# Patient Record
Sex: Male | Born: 1965 | Race: White | Hispanic: No | Smoking: Former smoker
Health system: Southern US, Community
[De-identification: ages and names within clinical notes are randomized; demographics above are authoritative.]

## PROBLEM LIST (undated history)

## (undated) DIAGNOSIS — M5431 Sciatica, right side: Secondary | ICD-10-CM

## (undated) DIAGNOSIS — Z87820 Personal history of traumatic brain injury: Secondary | ICD-10-CM

## (undated) DIAGNOSIS — Z8719 Personal history of other diseases of the digestive system: Secondary | ICD-10-CM

## (undated) DIAGNOSIS — E785 Hyperlipidemia, unspecified: Secondary | ICD-10-CM

## (undated) DIAGNOSIS — F329 Major depressive disorder, single episode, unspecified: Secondary | ICD-10-CM

## (undated) DIAGNOSIS — M1990 Unspecified osteoarthritis, unspecified site: Secondary | ICD-10-CM

## (undated) DIAGNOSIS — R202 Paresthesia of skin: Secondary | ICD-10-CM

## (undated) DIAGNOSIS — F32A Depression, unspecified: Secondary | ICD-10-CM

## (undated) DIAGNOSIS — G43909 Migraine, unspecified, not intractable, without status migrainosus: Secondary | ICD-10-CM

## (undated) DIAGNOSIS — K449 Diaphragmatic hernia without obstruction or gangrene: Secondary | ICD-10-CM

## (undated) DIAGNOSIS — G4733 Obstructive sleep apnea (adult) (pediatric): Secondary | ICD-10-CM

## (undated) DIAGNOSIS — I10 Essential (primary) hypertension: Secondary | ICD-10-CM

## (undated) DIAGNOSIS — L439 Lichen planus, unspecified: Secondary | ICD-10-CM

## (undated) DIAGNOSIS — M109 Gout, unspecified: Secondary | ICD-10-CM

## (undated) DIAGNOSIS — Z973 Presence of spectacles and contact lenses: Secondary | ICD-10-CM

## (undated) DIAGNOSIS — Z8619 Personal history of other infectious and parasitic diseases: Secondary | ICD-10-CM

## (undated) DIAGNOSIS — F909 Attention-deficit hyperactivity disorder, unspecified type: Secondary | ICD-10-CM

## (undated) DIAGNOSIS — K5730 Diverticulosis of large intestine without perforation or abscess without bleeding: Secondary | ICD-10-CM

## (undated) DIAGNOSIS — T4145XA Adverse effect of unspecified anesthetic, initial encounter: Secondary | ICD-10-CM

## (undated) DIAGNOSIS — R200 Anesthesia of skin: Secondary | ICD-10-CM

## (undated) DIAGNOSIS — R413 Other amnesia: Secondary | ICD-10-CM

## (undated) DIAGNOSIS — Z972 Presence of dental prosthetic device (complete) (partial): Secondary | ICD-10-CM

## (undated) DIAGNOSIS — G8929 Other chronic pain: Secondary | ICD-10-CM

## (undated) DIAGNOSIS — K219 Gastro-esophageal reflux disease without esophagitis: Secondary | ICD-10-CM

## (undated) DIAGNOSIS — T8859XA Other complications of anesthesia, initial encounter: Secondary | ICD-10-CM

## (undated) DIAGNOSIS — M545 Low back pain, unspecified: Secondary | ICD-10-CM

## (undated) DIAGNOSIS — E039 Hypothyroidism, unspecified: Secondary | ICD-10-CM

## (undated) HISTORY — PX: KNEE ARTHROSCOPY: SHX127

## (undated) HISTORY — PX: SHOULDER ARTHROSCOPY: SHX128

## (undated) HISTORY — PX: ANKLE ARTHROSCOPY: SUR85

## (undated) HISTORY — PX: HAND SURGERY: SHX662

## (undated) HISTORY — PX: CARPAL TUNNEL RELEASE: SHX101

---
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
## 1990-12-02 DIAGNOSIS — Z87820 Personal history of traumatic brain injury: Secondary | ICD-10-CM

## 1990-12-02 HISTORY — DX: Personal history of traumatic brain injury: Z87.820

## 1998-12-15 ENCOUNTER — Ambulatory Visit (HOSPITAL_BASED_OUTPATIENT_CLINIC_OR_DEPARTMENT_OTHER): Admission: RE | Admit: 1998-12-15 | Discharge: 1998-12-15 | Payer: Self-pay | Admitting: Orthopedic Surgery

## 2000-11-26 ENCOUNTER — Encounter: Admission: RE | Admit: 2000-11-26 | Discharge: 2001-01-20 | Payer: Self-pay | Admitting: Family Medicine

## 2003-05-20 ENCOUNTER — Ambulatory Visit (HOSPITAL_BASED_OUTPATIENT_CLINIC_OR_DEPARTMENT_OTHER): Admission: RE | Admit: 2003-05-20 | Discharge: 2003-05-20 | Payer: Self-pay | Admitting: Orthopedic Surgery

## 2005-02-11 ENCOUNTER — Ambulatory Visit: Payer: Self-pay | Admitting: Family Medicine

## 2005-05-22 ENCOUNTER — Ambulatory Visit: Payer: Self-pay | Admitting: Family Medicine

## 2006-06-13 ENCOUNTER — Ambulatory Visit: Payer: Self-pay | Admitting: Internal Medicine

## 2006-07-22 ENCOUNTER — Encounter: Payer: Self-pay | Admitting: Internal Medicine

## 2006-07-22 ENCOUNTER — Ambulatory Visit: Payer: Self-pay | Admitting: Internal Medicine

## 2008-04-22 ENCOUNTER — Telehealth: Payer: Self-pay | Admitting: Internal Medicine

## 2008-05-30 ENCOUNTER — Ambulatory Visit (HOSPITAL_COMMUNITY): Admission: RE | Admit: 2008-05-30 | Discharge: 2008-05-30 | Payer: Self-pay | Admitting: Orthopedic Surgery

## 2008-10-06 ENCOUNTER — Ambulatory Visit (HOSPITAL_COMMUNITY): Admission: RE | Admit: 2008-10-06 | Discharge: 2008-10-07 | Payer: Self-pay | Admitting: Orthopedic Surgery

## 2009-06-15 ENCOUNTER — Encounter (INDEPENDENT_AMBULATORY_CARE_PROVIDER_SITE_OTHER): Payer: Self-pay | Admitting: *Deleted

## 2009-08-02 ENCOUNTER — Ambulatory Visit (HOSPITAL_COMMUNITY): Admission: EM | Admit: 2009-08-02 | Discharge: 2009-08-02 | Payer: Self-pay | Admitting: Emergency Medicine

## 2010-01-26 ENCOUNTER — Telehealth: Payer: Self-pay | Admitting: Internal Medicine

## 2010-02-20 ENCOUNTER — Emergency Department (HOSPITAL_COMMUNITY): Admission: EM | Admit: 2010-02-20 | Discharge: 2010-02-20 | Payer: Self-pay | Admitting: Emergency Medicine

## 2010-09-20 ENCOUNTER — Emergency Department (HOSPITAL_COMMUNITY)
Admission: EM | Admit: 2010-09-20 | Discharge: 2010-09-21 | Payer: Self-pay | Source: Home / Self Care | Admitting: Emergency Medicine

## 2010-09-21 ENCOUNTER — Inpatient Hospital Stay (HOSPITAL_COMMUNITY): Admission: AD | Admit: 2010-09-21 | Discharge: 2010-09-25 | Payer: Self-pay | Admitting: Psychiatry

## 2010-09-21 ENCOUNTER — Ambulatory Visit: Payer: Self-pay | Admitting: Psychiatry

## 2011-01-01 NOTE — Progress Notes (Signed)
Summary: Schedule Colonoscopy  Phone Note Outgoing Call Call back at [facility] Phone [phone]   Call placed by: Harlow Mares CMA Duncan Dull),  January 26, 2010 10:52 AM Call placed to: Patient Summary of Call: Left message on patients machine to call back. patient needs colonoscopy Initial call taken by: Harlow Mares CMA Duncan Dull),  January 26, 2010 10:52 AM  Follow-up for Phone Call        Left message on patients machine to call back.  Follow-up by: Harlow Mares CMA Duncan Dull),  February 08, 2010 11:32 AM

## 2011-02-13 LAB — CBC
MCH: 30.6 pg (ref 26.0–34.0)
MCV: 86.5 fL (ref 78.0–100.0)
Platelets: 189 10*3/uL (ref 150–400)
RDW: 12.7 % (ref 11.5–15.5)
WBC: 6.8 10*3/uL (ref 4.0–10.5)

## 2011-02-13 LAB — BASIC METABOLIC PANEL
BUN: 12 mg/dL (ref 6–23)
CO2: 30 mEq/L (ref 19–32)
Calcium: 9.5 mg/dL (ref 8.4–10.5)
Chloride: 104 mEq/L (ref 96–112)
Creatinine, Ser: 1.21 mg/dL (ref 0.4–1.5)

## 2011-02-13 LAB — DIFFERENTIAL
Basophils Absolute: 0 10*3/uL (ref 0.0–0.1)
Eosinophils Absolute: 0.1 10*3/uL (ref 0.0–0.7)
Eosinophils Relative: 2 % (ref 0–5)
Neutrophils Relative %: 67 % (ref 43–77)

## 2011-02-13 LAB — ETHANOL: Alcohol, Ethyl (B): <5 mg/dL (ref 0–10)

## 2011-02-13 LAB — RAPID URINE DRUG SCREEN, HOSP PERFORMED
Amphetamines: NOT DETECTED
Benzodiazepines: POSITIVE — AB
Cocaine: POSITIVE — AB

## 2011-02-13 LAB — POCT CARDIAC MARKERS: Troponin i, poc: <0.05 ng/mL (ref 0.00–0.09)

## 2011-02-25 LAB — DIFFERENTIAL
Eosinophils Relative: 1 % (ref 0–5)
Lymphocytes Relative: 16 % (ref 12–46)
Lymphs Abs: 1.4 10*3/uL (ref 0.7–4.0)
Monocytes Absolute: 0.7 10*3/uL (ref 0.1–1.0)

## 2011-02-25 LAB — BASIC METABOLIC PANEL
GFR calc Af Amer: >60 mL/min (ref >60)
GFR calc non Af Amer: >60 mL/min (ref >60)
Potassium: 3.7 mEq/L (ref 3.5–5.1)
Sodium: 137 mEq/L (ref 135–145)

## 2011-02-25 LAB — CBC
HCT: 45.3 % (ref 39.0–52.0)
Hemoglobin: 15.9 g/dL (ref 13.0–17.0)
Platelets: 180 10*3/uL (ref 150–400)
RBC: 5.29 MIL/uL (ref 4.22–5.81)
WBC: 8.4 10*3/uL (ref 4.0–10.5)

## 2011-02-25 LAB — ETHANOL: Alcohol, Ethyl (B): <5 mg/dL (ref 0–10)

## 2011-03-08 LAB — CBC
HCT: 44.6 % (ref 39.0–52.0)
Hemoglobin: 15.5 g/dL (ref 13.0–17.0)
MCHC: 34.8 g/dL (ref 30.0–36.0)
MCV: 89.4 fL (ref 78.0–100.0)
Platelets: 162 10*3/uL (ref 150–400)
RBC: 4.99 MIL/uL (ref 4.22–5.81)
RDW: 13.2 % (ref 11.5–15.5)
WBC: 6.8 10*3/uL (ref 4.0–10.5)

## 2011-03-08 LAB — BASIC METABOLIC PANEL
BUN: 15 mg/dL (ref 6–23)
CO2: 25 mEq/L (ref 19–32)
Calcium: 8.9 mg/dL (ref 8.4–10.5)
Chloride: 105 mEq/L (ref 96–112)
Creatinine, Ser: 1.01 mg/dL (ref 0.4–1.5)
GFR calc Af Amer: >60 mL/min (ref >60)
GFR calc non Af Amer: >60 mL/min (ref >60)
Glucose, Bld: 98 mg/dL (ref 70–99)
Potassium: 3.8 mEq/L (ref 3.5–5.1)
Sodium: 137 mEq/L (ref 135–145)

## 2011-03-08 LAB — DIFFERENTIAL
Basophils Absolute: 0 10*3/uL (ref 0.0–0.1)
Basophils Relative: 0 % (ref 0–1)
Eosinophils Absolute: 0.1 10*3/uL (ref 0.0–0.7)
Eosinophils Relative: 1 % (ref 0–5)
Lymphocytes Relative: 18 % (ref 12–46)
Lymphs Abs: 1.2 10*3/uL (ref 0.7–4.0)
Monocytes Absolute: 0.5 10*3/uL (ref 0.1–1.0)
Monocytes Relative: 8 % (ref 3–12)
Neutro Abs: 4.9 10*3/uL (ref 1.7–7.7)
Neutrophils Relative %: 73 % (ref 43–77)

## 2011-04-16 NOTE — Consult Note (Signed)
NAME:  QUANTAE, MARTEL NO.:  [id number]   MEDICAL RECORD NO.:  [id number]          PATIENT TYPE:  OBV   LOCATION:  2550                         FACILITY:  [facility]   PHYSICIAN:  Artist Pais. Weingold, M.D.DATE OF BIRTH:  01/17/1966   DATE OF CONSULTATION:  08/02/2009  DATE OF DISCHARGE:                                 CONSULTATION   PHYSICIAN REQUESTING CONSULTATION:  Dr. Clarene Duke   REASON FOR CONSULTATION:  Camp Gopal is a 45 year old left-hand  dominant male who was working on transmission and approximately had the  transmission fall onto his left hand and suffered a severe crush injury  to his left fifth digit with exposed tendon bone.  He is 45 years old,  he is now to sulfa drugs.  He is currently taking medicines for  hypercholesteremia, hypertension, and hypothyroidism.   PAST SURGICAL HISTORY:  He has had surgery in the past including  bilateral carpal tunnel syndrome surgeries, rotator cuff surgery.   FAMILY HISTORY:  Noncontributory.   SOCIAL HISTORY:  Noncontributory.   PHYSICAL EXAMINATION:  GENERAL:  Well-developed, well-nourished male,  pleasant, alert, and oriented x3.  EXTREMITIES:  Examination of his left hand, he has a severe crush  injury.  The volar aspect of his left fifth digit from the MCP flexion  crease, the tip is open, there is exposed flexor tendon, and gross  contamination as well as an obvious comminuted fracture of the distal  phalanx and probable involvement of the middle phalanx.  He can not flex  and extend.  Neurosensory exam is difficult.   X-rays revealed a comminuted fracture of the distal phalanx with minimal  remnant of bone seen in the x-ray as well as a fracture of the head of  the middle phalanx.   IMPRESSION:  A 45 year old left-hand dominant male with severe crush  injury to his left fifth digit.  I explained to both Mr. Jozwiak and his  family today.  I recommend that we take him to the operating room for an  I and  D and repair as necessary with possible amputation at the middle  phalangeal level.  He understands the risks and benefits.  We will take  him to the operating room as soon as possible for previously mentioned  procedures.      Artist Pais Mina Marble, M.D.  Electronically Signed     MAW/MEDQ  D:  08/02/2009  T:  08/03/2009  Job:  [id number]

## 2011-04-16 NOTE — Op Note (Signed)
NAME:  Douglas Wiggins, Douglas Wiggins NO.:  [id number]   MEDICAL RECORD NO.:  [id number]          PATIENT TYPE:  OIB   LOCATION:  5019                         FACILITY:  [facility]   PHYSICIAN:  Vania Rea. Supple, M.D.  DATE OF BIRTH:  [birth year]   DATE OF PROCEDURE:  10/06/2008  DATE OF DISCHARGE:                               OPERATIVE REPORT   PREOPERATIVE DIAGNOSES:  1. Chronic right shoulder impingement syndrome.  2. Right shoulder acromioclavicular joint arthrosis.  3. Right shoulder posterior inferior labral tear.   POSTOPERATIVE DIAGNOSES:  1. Chronic right shoulder impingement syndrome.  2. Right shoulder acromioclavicular joint arthrosis.  3. Right shoulder posterior inferior labral tear.  4. Partial articular tear of the subscapularis.   PROCEDURES:  1. Right shoulder examination under anesthesia.  2. Right shoulder diagnostic arthroscopy.  3. Debridement of posterior inferior labral tear.  4. Partial articular subscapularis tendon tear.  5. Arthroscopic subacromial depression and bursectomy.  6. Arthroscopic distal clavicle resection.   SURGEON:  Vania Rea. Supple, MD   ASSISTANT:  Lucita Lora. Shuford, PA-C   ANESTHESIA:  General endotracheal as well as a preoperative interscalene  block.   ESTIMATED BLOOD LOSS:  Minimal.   DRAINS:  None.   HISTORY:  Mr. Tilly is a 45 year old gentleman who has had chronic  right shoulder pain with weakness and restriction mobility.  His  symptoms that have been refractory to prolonged attempts at conservative  management.  His preoperative x-rays and MRI scan show evidence for  posterior inferior labral tear as well as associated paralabral cyst and  a type 2 acromial morphology.  Due to his ongoing pain and functional  limitations and failure to respond and prolonged attempts at  conservative management, he is brought to the operating room at this  time for a planned right shoulder arthroscopy as described below.   Preoperatively, I counseled Mr. Ingalsbe on treatment options as well as  risks versus benefits thereof.  Possible surgical complications  including infection, neurovascular injury, persistent pain, loss of  motion, anesthetic complication, and possible need for additional  surgery are reviewed.  He understands, accepts, and agrees with our  planned procedure.   PROCEDURE IN DETAIL:  After undergoing routine preop evaluation, the  patient received prophylactic antibiotics.  Then, an interscalene block  was established in holding area by the anesthesia department.  Placed  supine on the operative table and underwent smooth induction of a  general endotracheal anesthesia.  Turned to the left lateral decubitus  position on a beanbag and appropriately padded and protected.  The right  shoulder examination under anesthesia revealed full passive motion.  There were no obvious instability patterns.  Right arm was suspended at  70 degrees of abduction with 10 pounds of traction.  The right shoulder  girdle region was then sterilely prepped and draped in standard fashion.  Posterior portal was absent from the glenohumeral joint and anterior  portal was absent under direct visualization.  The glenohumeral  articular surfaces were in good condition.  The anterior labrum showed  some minimal degenerative fraying which was debrided as  did the superior  labrum consistent with a type 1 slap lesion.  There was a partial  articular tear of the subscapularis along its upper margin.  This was  debrided the shaver to a stable margin and the Arthrex wand was used to  obtain hemostasis.  Careful in evaluating the shoulder for instability  patterns, there was no evidence for pathologic laxity or instability.  There was, however, a posterior inferior labral tear which was debrided  with a shaver to stable margin.  The superior aspect of rotator cuff  including the distal supraspinatus insertion were carefully  inspected  and found to be in good condition.  Biceps tendon showed normal caliber  and no evidence for instability to proximally and distally.  At this  point, final hemostasis was then completed within the glenohumeral  joint.  Irrigation was completed.  Fluid and instruments were removed.  The arm was then dropped down to 30 degrees of abduction with the  arthroscope introduced in the subacromial space from posterior portal  and direct lateral portal established in the subacromial space.  This  was found to be abundant.  Very thick proliferative bursal tissue  throughout the subacromial/subdeltoid bursa.  This was excised with a  combination the shaver and with the Arthrex wand.  The wand was then  used to remove the periosteum from the undersurface of the anterior half  of the acromion, then subacromial decompression was then performed with  a bur creating a type of morphology.  Of note, the subacromial space was  very stenotic prior to decompression.  We then established a portal  directly anterior to the distal clavicle and the distal clavicle  resection was performed with a bur.  Care was taken to make sure the  entire circumference of the distal clavicle could be visualized to  ensure adequate removal of bone.  We then completed the  subacromial/subdeltoid bursectomy.  The bursal surface of rotator cuff  was found to be intact.  Final hemostasis was obtained.  Fluid and  instruments were removed.  The portals were closed with Monocryl and  Steri-Strips.  Bulky dry dressing was taped at the right shoulder.  The  right arm was placed in shoulder immobilizer.  The patient supine,  extubated, and taken to recovery room in stable condition.      Vania Rea. Supple, M.D.  Electronically Signed     KMS/MEDQ  D:  10/06/2008  T:  10/06/2008  Job:  [id number]

## 2011-04-16 NOTE — Op Note (Signed)
NAME:  [name], Douglas Wiggins NO.:  [id number]   MEDICAL RECORD NO.:  [id number]          PATIENT TYPE:  OBV   LOCATION:  2550                         FACILITY:  [facility]   PHYSICIAN:  Artist Pais. Weingold, M.D.DATE OF BIRTH:  01/31/1966   DATE OF PROCEDURE:  08/02/2009  DATE OF DISCHARGE:                               OPERATIVE REPORT   PREOPERATIVE DIAGNOSIS:  Severe crush injury, left fifth digit.   POSTOPERATIVE DIAGNOSIS:  Severe crush injury, left fifth digit.   PROCEDURES:  Incision and drainage of above with repair of flexor  digitorum profundus tendon, open treatment middle phalangeal fracture  and nail bed repair.   SURGEON:  Artist Pais. Mina Marble, MD   ASSISTANT:  None.   ANESTHESIA:  General.   TOURNIQUET TIME:  49 minutes.   COMPLICATIONS:  None.   DRAINS:  None.   OPERATIVE REPORT:  The patient was taken to the operating suite.  After  the induction of adequate general anesthesia, left upper extremity was  prepped and draped in usual sterile fashion.  An Esmarch was used to  exsanguinate the limb.  Tourniquet was then inflated to 250 mmHg.  At  this point in time, the volar aspect of the left fifth digit was  approached surgically.  There was a severely comminuted injury, which  had gross contamination and near obliteration of the distal phalanx, 2  or 3 very small remnant of bone were left.  The head of the middle  phalanx was comminuted and crushed as well.  The base of the middle  phalanx and proximal phalanx were intact.  The flexor sheath was  exposed.  The superficialis tendon was intact with a small tearing of  the radial most slip.  The profundus tendon was completely avulsed off  of the distal phalanx, and the A4 pulley was also partially damaged.  The wound was thoroughly irrigated, debrided of clot and nonviable  material.  The small bleeding fragments were excised leaving the distal  aspect of middle phalanx.  At this point in time, the  profundus tendon  was trimmed down to normal-looking tendon and sutured to the middle  phalanx periosteum in a tenodesis-type effect.  The A4 pulley was also  carefully also repaired.  Once this was completed, the skin was  approximated using 4-0 Vicryl Rapide suture to the level of the distal  phalanx.  The nail plate was carefully removed, and the nail bed was  repaired with 6-0 chromic.  There was a stellate comminuted complex  injury to the nail bed which was again repaired with 6-0 chromic.  After  this was done, the nail plate was placed back under the eponychial fold  and sewn in place using 4-0 Vicryl Rapide suture.  Intraoperative  fluoroscopy then revealed complete loss of the distal phalanx osseous  structures and an amputation of the middle phalanx distally just  proximal to the middle phalangeal head.  The patient's finger was  viable.  The tourniquet was released.  It was then dressed with  Xeroform, 4 x 4s, and an ulnar gutter splint.  The  patient tolerated the  procedures well, went to recovery room in stable fashion.      Artist Pais Mina Marble, M.D.  Electronically Signed     MAW/MEDQ  D:  08/02/2009  T:  08/03/2009  Job:  [id number]

## 2011-04-19 NOTE — Op Note (Signed)
NAME:  Douglas Wiggins, Douglas Wiggins NO.:  [id number]   MEDICAL RECORD NO.:  [id number]                   PATIENT TYPE:  AMB   LOCATION:  DSC                                  FACILITY:  [facility]   PHYSICIAN:  Harvie Junior, M.D.                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   POSTOPERATIVE DIAGNOSIS:  Degenerative joint disease ankle with lateral  pain.   PROCEDURE:  Left ankle arthroscopy with debridement of lateral gutter and  removal of medial loose body.   SURGEON:  Harvie Junior, M.D.   ASSISTANT:  Su Hilt, PA-C   ANESTHESIA:  General   BRIEF HISTORY:  He is a 45 year old male with a long history of having right  ankle and foot pain. He ultimately had been treated with injection therapy  which helped significantly followed by physical therapy and orthotics.  Following this, he continued to have ankle pain. It was improved  dramatically with intra-articular injection.  Because of his improvement  with intraarticular injection he will be taken to the operating room for  ankle arthroscopy after failure of other conservative care procedures.   DESCRIPTION OF PROCEDURE:  The patient taken to the operating room after  adequate anesthesia obtained with general anesthetic.  The patient was  placed on the operating room table where the right leg was prepped and  draped in the usual sterile fashion.  Following Esmarch bandage placed on  the extremity, blood pressure and tourniquet inflated to 350 mmHg.  Following this portals were made, anteromedial, anterolateral for  introduction of instruments and camera.  The ankle was identified and noted  to have some osteochondral defects on the lateral side as well as a fair  amount of synovitis. This was debrided and the lateral gutter was  cleaned.  Attention was then turned medially where there was noted to a medial loose  body inferior to the medial malleolus.  This was debrided and the medial  gutter was cleaned.  At this point the synovitis was removed anteriorly in  the knee.  The knee was then copiously irrigated and suctioned throughout.  The outside portals were closed with a bandage.  A sterile compression  dressing was applied and the patient was taken to recovery and was noted to  be in satisfactory condition.  Estimated blood loss for the procedure was  none.                                               Harvie Junior, M.D.    Ranae Plumber  D:  05/20/2003  T:  05/23/2003  Job:  [id number]   cc:   Dr. Montey Hora  [facility]

## 2011-04-19 NOTE — Assessment & Plan Note (Signed)
[facility]                           GASTROENTEROLOGY OFFICE NOTE   NAME:FALLINLevern, Pitter                         MRN:          [id number]  DATE:06/13/2006                            DOB:          [birth year]    REFERRING PHYSICIAN:  Dr. Samuel Jester.   REASON FOR CONSULTATION:  Multiple abdominal complaints.   HISTORY:  This is a 45 year old white male with a history of hypertension,  hypothyroidism, and unspecified arthritis who presents regarding multiple GI  complaints, precipitated by recent problems with rectal bleeding.  The  patient reports chronic problems with indigestion and heartburn for which he  takes omeprazole daily.  On medication no symptoms; off medication recurrent  symptoms.  He has been on the drug for approximately 5 years.  No dysphagia.  He also reports having had problems with abdominal cramping, urgency, and  loose stools as a child.  Currently with significant problems with bloating,  early satiety, and discomfort with bending.  He has gained 10 pounds over  the past 2 years.  Transient loose stools, though none recently.  Approximately a month ago saw significant amounts of red blood in the toilet  bowel.  There was no associated rectal or abdominal pain.  Subsequently last  week noticing more dark stools.  Hemoglobin after the rectal bleeding in Dr.  Silvana Newness office was normal on May 02, 2006, at 15.8.  He is accompanied by  his wife.   PAST MEDICAL HISTORY:  1.  Hypertension.  2.  Hypothyroidism.  3.  Unspecified arthritis.   PAST SURGICAL HISTORY:  1.  Hemorrhoidectomy.  2.  Right ankle surgery.  3.  Right knee surgery.  4.  Right carpal tunnel release.   ALLERGIES:  SULFA.   CURRENT MEDICATIONS:  1.  Requip 1 mg daily.  2.  Quinapril HCL 20 mg daily.  3.  Meloxicam 7.5 mg daily.  4.  Levoxyl 75 mcg daily.  5.  Hydroxyzine 10 mg p.r.n.  6.  Effexor XR 75 mg daily.  7.  Omeprazole 20 mg daily.   FAMILY  HISTORY:  No family history of gastrointestinal malignancy.   SOCIAL HISTORY:  The patient is married without children.  He is accompanied  by his wife.  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  He does not smoke and  occasionally uses alcohol.   REVIEW OF SYSTEMS:  Per diagnostic evaluation form.   PHYSICAL EXAMINATION:  GENERAL:  A slightly anxious but otherwise well-  appearing male in no acute distress.  VITAL SIGNS:  Blood pressure 154/94, heart rate is 64 and regular, weight  250.6 pounds.  He is 6 feet 4 inches in height.  HEENT:  Sclerae are anicteric.  Conjunctivae are pink.  Oral mucosa intact.  No adenopathy.  LUNGS:  Clear.  HEART:  Regular.  ABDOMEN:  Soft without tenderness, mass, or hernia.  Good bowel sounds  heard.  EXTREMITIES:  Without edema.   IMPRESSION:  1.  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  2.  More chronic complaints of bloating, gas, occasional urgency likely due      to irritable bowel syndrome.  3.  Recent problems with rectal bleeding and dark stools of uncertain      etiology.   RECOMMENDATIONS:  1.  Continue proton pump inhibitor.  2.  Schedule colonoscopy and upper endoscopy.  The nature of the procedures      as well as the risks, benefits, and alternatives have been reviewed.  He      understood and agreed to proceed.  3.  Ongoing general medical care with Dr. Charm Barges.                                   Wilhemina Bonito. Eda Keys., MD   JNP/MedQ  DD:  06/13/2006  DT:  06/13/2006  Job #:  [id number]   cc:   Samuel Jester

## 2011-09-02 LAB — CBC
MCHC: 34.2
Platelets: 261
RDW: 12.8

## 2011-09-02 LAB — COMPREHENSIVE METABOLIC PANEL
ALT: 40
AST: 36
Calcium: 9.8
GFR calc Af Amer: >60
Sodium: 137
Total Protein: 7

## 2011-09-02 LAB — APTT: aPTT: 33

## 2011-09-03 LAB — URINALYSIS, ROUTINE W REFLEX MICROSCOPIC
Glucose, UA: NEGATIVE
Nitrite: NEGATIVE
Protein, ur: NEGATIVE
pH: 6.5

## 2011-09-03 LAB — GLUCOSE, CAPILLARY: Glucose-Capillary: 94

## 2012-01-05 ENCOUNTER — Encounter (HOSPITAL_COMMUNITY): Payer: Self-pay

## 2012-01-05 ENCOUNTER — Emergency Department (HOSPITAL_COMMUNITY): Payer: BC Managed Care – PPO

## 2012-01-05 ENCOUNTER — Inpatient Hospital Stay (HOSPITAL_COMMUNITY)
Admission: EM | Admit: 2012-01-05 | Discharge: 2012-01-11 | DRG: 149 | Disposition: A | Discharge status: Home Health | Payer: BC Managed Care – PPO | Attending: General Surgery | Admitting: General Surgery

## 2012-01-05 ENCOUNTER — Other Ambulatory Visit: Payer: Self-pay

## 2012-01-05 DIAGNOSIS — E039 Hypothyroidism, unspecified: Secondary | ICD-10-CM | POA: Diagnosis present

## 2012-01-05 DIAGNOSIS — K5732 Diverticulitis of large intestine without perforation or abscess without bleeding: Principal | ICD-10-CM | POA: Diagnosis present

## 2012-01-05 DIAGNOSIS — R000 Tachycardia, unspecified: Secondary | ICD-10-CM | POA: Diagnosis present

## 2012-01-05 DIAGNOSIS — R110 Nausea: Secondary | ICD-10-CM

## 2012-01-05 DIAGNOSIS — K5720 Diverticulitis of large intestine with perforation and abscess without bleeding: Secondary | ICD-10-CM | POA: Diagnosis present

## 2012-01-05 DIAGNOSIS — E669 Obesity, unspecified: Secondary | ICD-10-CM | POA: Diagnosis present

## 2012-01-05 DIAGNOSIS — D72829 Elevated white blood cell count, unspecified: Secondary | ICD-10-CM | POA: Diagnosis present

## 2012-01-05 DIAGNOSIS — I10 Essential (primary) hypertension: Secondary | ICD-10-CM | POA: Diagnosis present

## 2012-01-05 DIAGNOSIS — R197 Diarrhea, unspecified: Secondary | ICD-10-CM

## 2012-01-05 DIAGNOSIS — Z6831 Body mass index (BMI) 31.0-31.9, adult: Secondary | ICD-10-CM

## 2012-01-05 DIAGNOSIS — R109 Unspecified abdominal pain: Secondary | ICD-10-CM

## 2012-01-05 DIAGNOSIS — R509 Fever, unspecified: Secondary | ICD-10-CM | POA: Diagnosis present

## 2012-01-05 DIAGNOSIS — E079 Disorder of thyroid, unspecified: Secondary | ICD-10-CM | POA: Diagnosis present

## 2012-01-05 HISTORY — DX: Essential (primary) hypertension: I10

## 2012-01-05 HISTORY — DX: Depression, unspecified: F32.A

## 2012-01-05 HISTORY — DX: Major depressive disorder, single episode, unspecified: F32.9

## 2012-01-05 LAB — URINALYSIS, ROUTINE W REFLEX MICROSCOPIC
Glucose, UA: NEGATIVE mg/dL
Ketones, ur: NEGATIVE mg/dL
Leukocytes, UA: NEGATIVE
Protein, ur: NEGATIVE mg/dL

## 2012-01-05 LAB — COMPREHENSIVE METABOLIC PANEL
AST: 20 U/L (ref 0–37)
BUN: 23 mg/dL (ref 6–23)
CO2: 24 mEq/L (ref 19–32)
Chloride: 98 mEq/L (ref 96–112)
Creatinine, Ser: 1.27 mg/dL (ref 0.50–1.35)
GFR calc non Af Amer: 68 mL/min — ABNORMAL LOW (ref >90)
Glucose, Bld: 109 mg/dL — ABNORMAL HIGH (ref 70–99)
Total Bilirubin: 0.6 mg/dL (ref 0.3–1.2)

## 2012-01-05 LAB — CBC
HCT: 46.7 % (ref 39.0–52.0)
Hemoglobin: 16.8 g/dL (ref 13.0–17.0)
WBC: 7.7 10*3/uL (ref 4.0–10.5)

## 2012-01-05 LAB — DIFFERENTIAL
Lymphocytes Relative: 30 % (ref 12–46)
Lymphs Abs: 2.3 10*3/uL (ref 0.7–4.0)
Monocytes Absolute: 0.8 10*3/uL (ref 0.1–1.0)
Monocytes Relative: 10 % (ref 3–12)
Neutro Abs: 4.4 10*3/uL (ref 1.7–7.7)

## 2012-01-05 LAB — LIPASE, BLOOD: Lipase: 34 U/L (ref 11–59)

## 2012-01-05 MED ORDER — ACETAMINOPHEN 325 MG PO TABS
650.0000 mg | ORAL_TABLET | Freq: Once | ORAL | Status: AC
  Administered 2012-01-05: 650 mg via ORAL
  Filled 2012-01-05: qty 2

## 2012-01-05 MED ORDER — HYDROMORPHONE HCL PF 1 MG/ML IJ SOLN
1.0000 mg | Freq: Once | INTRAMUSCULAR | Status: AC
  Administered 2012-01-05: 1 mg via INTRAVENOUS
  Filled 2012-01-05: qty 1

## 2012-01-05 MED ORDER — ONDANSETRON HCL 4 MG/2ML IJ SOLN: 4.0000 mg | Freq: Four times a day (QID) | INTRAMUSCULAR | Status: DC | PRN

## 2012-01-05 MED ORDER — PANTOPRAZOLE SODIUM 40 MG IV SOLR
40.0000 mg | Freq: Every day | INTRAVENOUS | Status: DC
  Administered 2012-01-06 – 2012-01-10 (×6): 40 mg via INTRAVENOUS
  Filled 2012-01-05 (×6): qty 40

## 2012-01-05 MED ORDER — SODIUM CHLORIDE 0.9 % IV SOLN
1.0000 g | INTRAVENOUS | Status: DC
  Filled 2012-01-05: qty 1

## 2012-01-05 MED ORDER — LEVOTHYROXINE SODIUM 75 MCG PO TABS
75.0000 ug | ORAL_TABLET | Freq: Every day | ORAL | Status: DC
  Administered 2012-01-07 – 2012-01-11 (×5): 75 ug via ORAL
  Filled 2012-01-05 (×7): qty 1

## 2012-01-05 MED ORDER — ONDANSETRON HCL 4 MG/2ML IJ SOLN
4.0000 mg | Freq: Once | INTRAMUSCULAR | Status: AC
  Administered 2012-01-05: 4 mg via INTRAVENOUS
  Filled 2012-01-05: qty 2

## 2012-01-05 MED ORDER — DEXTROSE IN LACTATED RINGERS 5 % IV SOLN
INTRAVENOUS | Status: DC
  Administered 2012-01-05 – 2012-01-06 (×2): via INTRAVENOUS

## 2012-01-05 MED ORDER — HYDROMORPHONE HCL PF 1 MG/ML IJ SOLN
0.5000 mg | INTRAMUSCULAR | Status: DC | PRN
  Administered 2012-01-05 – 2012-01-06 (×3): 1 mg via INTRAVENOUS
  Filled 2012-01-05 (×3): qty 1

## 2012-01-05 MED ORDER — SODIUM CHLORIDE 0.9 % IV BOLUS (SEPSIS)
1000.0000 mL | Freq: Once | INTRAVENOUS | Status: AC
  Administered 2012-01-05: 1000 mL via INTRAVENOUS

## 2012-01-05 MED ORDER — IOHEXOL 300 MG/ML  SOLN
100.0000 mL | Freq: Once | INTRAMUSCULAR | Status: AC | PRN
  Administered 2012-01-05: 100 mL via INTRAVENOUS

## 2012-01-05 MED ORDER — SODIUM CHLORIDE 0.9 % IV SOLN
1.0000 g | Freq: Once | INTRAVENOUS | Status: AC
  Administered 2012-01-05: 1 g via INTRAVENOUS
  Filled 2012-01-05 (×2): qty 1

## 2012-01-05 NOTE — ED Notes (Signed)
Suprapubic intermittent sharp pain x 3 days, states the pain started RLQ and moved to suprapubic area and is now radiating into the LLQ, no N/V/D, last BM today, has had blood in the stool x 3-4 months but stopped 3 days ago

## 2012-01-05 NOTE — ED Provider Notes (Addendum)
History     CSN: [id number]  Arrival date & time 01/05/12  1836   First MD Initiated Contact with Patient 01/05/12 1841      No chief complaint on file.   (Consider location/radiation/quality/duration/timing/severity/associated sxs/prior treatment) Patient is a 46 y.o. male presenting with abdominal pain.  Abdominal Pain The primary symptoms of the illness include abdominal pain. The primary symptoms of the illness do not include fever, vomiting, diarrhea or dysuria.  Symptoms associated with the illness do not include chills.   Patient states the pain actually began to 3 days ago, but became much worse this evening. He describes it as being diffuse in the center of the abdomen and in the bilateral lower quadrants. He denies vomiting or fever. He denies any hematuria or back pain. He denies any genital pain. He has had some blood in his stool, but attributes it more to hemorrhoids. The pain is currently 10 out of 10. He's had no chest pain. No shortness of breath. No hematemesis. He states he felt he may have had the pain. Several weeks ago, but did not see a doctor at that time. No past medical history on file.  No past surgical history on file.  No family history on file.  History  Substance Use Topics  . Smoking status: Not on file  . Smokeless tobacco: Not on file  . Alcohol Use: Not on file      Review of Systems  Constitutional: Negative for fever and chills.  Respiratory: Negative for chest tightness.   Gastrointestinal: Positive for abdominal pain. Negative for vomiting and diarrhea.  Genitourinary: Negative for dysuria and flank pain.  Neurological: Negative for seizures.  All other systems reviewed and are negative.    Allergies  Review of patient's allergies indicates not on file.  Home Medications  No current outpatient prescriptions on file.  BP 155/94  Pulse 79  Temp(Src) 98.1 F (36.7 C) (Oral)  Resp 16  SpO2 100%  Physical Exam  Nursing note  and vitals reviewed. Constitutional: He is oriented to person, place, and time. He appears well-developed and well-nourished.  HENT:  Head: Normocephalic and atraumatic.  Eyes: Conjunctivae and EOM are normal. Pupils are equal, round, and reactive to light.  Neck: Neck supple.  Cardiovascular: Normal rate and regular rhythm.  Exam reveals no gallop and no friction rub.   No murmur heard. Pulmonary/Chest: Breath sounds normal. He has no wheezes. He has no rales. He exhibits no tenderness.  Abdominal: Soft. Bowel sounds are normal. He exhibits no distension. There is tenderness. There is guarding. There is no rebound.       Diffuse midline lower abdominal tenderness, with some voluntary guarding. Bowel sounds are present, but minimal. There is no rebound tenderness.  Musculoskeletal: Normal range of motion.  Neurological: He is alert and oriented to person, place, and time. No cranial nerve deficit. Coordination normal.  Skin: Skin is warm and dry. No rash noted.  Psychiatric: He has a normal mood and affect.    ED Course  Procedures (including critical care time)  Labs Reviewed - No data to display No results found.   No diagnosis found.    MDM  Patient is seen and examined, initial history and physical is completed. Evaluation initiated  Stat labs, IV fluid hydration, IV pain meds. Stat CT with contrast and will follow closely.  Initial VSS.    Spoke with CT tech to expedite scan.    Will follow closely.    Results for  orders placed during the hospital encounter of 01/05/12  CBC      Component Value Range   WBC 7.7  4.0 - 10.5 (K/uL)   RBC 5.40  4.22 - 5.81 (MIL/uL)   Hemoglobin 16.8  13.0 - 17.0 (g/dL)   HCT 16.1  09.6 - 04.5 (%)   MCV 86.5  78.0 - 100.0 (fL)   MCH 31.1  26.0 - 34.0 (pg)   MCHC 36.0  30.0 - 36.0 (g/dL)   RDW 40.9  81.1 - 91.4 (%)   Platelets 157  150 - 400 (K/uL)  DIFFERENTIAL      Component Value Range   Neutrophils Relative 57  43 - 77 (%)    Neutro Abs 4.4  1.7 - 7.7 (K/uL)   Lymphocytes Relative 30  12 - 46 (%)   Lymphs Abs 2.3  0.7 - 4.0 (K/uL)   Monocytes Relative 10  3 - 12 (%)   Monocytes Absolute 0.8  0.1 - 1.0 (K/uL)   Eosinophils Relative 3  0 - 5 (%)   Eosinophils Absolute 0.2  0.0 - 0.7 (K/uL)   Basophils Relative 0  0 - 1 (%)   Basophils Absolute 0.0  0.0 - 0.1 (K/uL)  COMPREHENSIVE METABOLIC PANEL      Component Value Range   Sodium 135  135 - 145 (mEq/L)   Potassium 3.9  3.5 - 5.1 (mEq/L)   Chloride 98  96 - 112 (mEq/L)   CO2 24  19 - 32 (mEq/L)   Glucose, Bld 109 (*) 70 - 99 (mg/dL)   BUN 23  6 - 23 (mg/dL)   Creatinine, Ser 7.82  0.50 - 1.35 (mg/dL)   Calcium 95.6  8.4 - 10.5 (mg/dL)   Total Protein 7.8  6.0 - 8.3 (g/dL)   Albumin 3.9  3.5 - 5.2 (g/dL)   AST 20  0 - 37 (U/L)   ALT 26  0 - 53 (U/L)   Alkaline Phosphatase 72  39 - 117 (U/L)   Total Bilirubin 0.6  0.3 - 1.2 (mg/dL)   GFR calc non Af Amer 68 (*) >90 (mL/min)   GFR calc Af Amer 79 (*) >90 (mL/min)  LIPASE, BLOOD      Component Value Range   Lipase 34  11 - 59 (U/L)   Ct Abdomen Pelvis W Contrast  01/05/2012  *RADIOLOGY REPORT*  Clinical Data: Abdominal pain, guarding.  CT ABDOMEN AND PELVIS WITH CONTRAST  Technique:  Multidetector CT imaging of the abdomen and pelvis was performed following the standard protocol during bolus administration of intravenous contrast.  Contrast: OMNIPAQUE IOHEXOL 300 MG/ML IV SOLN  Comparison: None.  Findings: Minimal dependent atelectasis in the visualized lung bases.  Mild diffuse fatty infiltration of the liver without focal lesion.  Unremarkable gallbladder, spleen, adrenal glands, kidneys, pancreas, abdominal aorta stomach and small bowel decompressed. Normal appendix.  Scattered colonic diverticula.  There are inflammatory/edematous changes around the proximal sigmoid colon, a few tiny regional extraluminal gas bubbles and a small amount of free peritoneal gas.  There is a small amount of left lower  quadrant mesenteric fluid without peripheral enhancement or loculation to suggest abscess.   Urinary bladder incompletely distended.  Mild prostatic prominence with central calcification. No adenopathy.  IMPRESSION:  1.  Probable sigmoid diverticulitis with perforation and small amount of free peritoneal gas. Follow up recommended as perforated carcinoma can have a similar appearance.  2.  No evidence of abscess. I telephoned the critical test results to Dr.  Laura Radilla at the time of interpretation.  Original Report Authenticated By: Osa Craver, M.D.        8:23 PM  CT findings discussed with both the patient and with the general surgeon, Dr. Suzzanne Cloud, who started Invanz antibiotics as per Dr. Pollyann Kennedy. Bauer. Patient will likely need surgery. This evening. His vital signs are stable. Await formal consultation by the surgeon. We'll continue pain medications and n.p.o.   Orlinda Slomski A. Patrica Duel, MD 01/05/12 2023  Lorelle Gibbs. Patrica Duel, MD 01/05/12 2025

## 2012-01-05 NOTE — ED Notes (Signed)
MD at bedside. 

## 2012-01-05 NOTE — H&P (Signed)
Douglas Wiggins is an 46 y.o. male.   Chief Complaint:   Severe lower abdominal pain HPI:   He developed lower abdominal pain early Saturday morning. It was a little bit better this morning but then got acutely worse and was associated with some nausea and chills but no vomiting. He's had some diarrhea prior to this and states he has blood blood in his stool 7/10 times for a number of months even up to a year now. He had something like this a couple weeks ago but it was self-limited. He has not noted any change in this caliber of his stool. He has not had any unplanned weight loss. He was seen in emergency department and noted to have diverticulitis microperforation and I was asked to see him.  Past Medical History  Diagnosis Date  . Hypertension   . Thyroid disease     Past Surgical History  Procedure Date  . Knee surgery   . Shoulder surgery    Finger surgery-left hand  No family history on file. Social History:  reports that he has never smoked. He does not have any smokeless tobacco history on file. He reports that he does not drink alcohol or use illicit drugs. He is working. Allergies:  Allergies  Allergen Reactions  . Sulfa Antibiotics Hives and Rash    Medications Prior to Admission  Medication Dose Route Frequency Provider Last Rate Last Dose  . dextrose 5 % in lactated ringers infusion   Intravenous Continuous Adolph Pollack, MD      . ertapenem [facility]) 1 g in sodium chloride 0.9 % 50 mL IVPB  1 g Intravenous Once Peter A. Patrica Duel, MD   1 g at 01/05/12 2049  . ertapenem (INVANZ) 1 g in sodium chloride 0.9 % 50 mL IVPB  1 g Intravenous Q24H Adolph Pollack, MD      . HYDROmorphone (DILAUDID) injection 0.5-1 mg  0.5-1 mg Intravenous Q2H PRN Adolph Pollack, MD      . HYDROmorphone (DILAUDID) injection 1 mg  1 mg Intravenous Once Peter A. Patrica Duel, MD   1 mg at 01/05/12 1905  . HYDROmorphone (DILAUDID) injection 1 mg  1 mg Intravenous Once Peter A. Patrica Duel, MD   1 mg at  01/05/12 2050  . iohexol (OMNIPAQUE) 300 MG/ML solution 100 mL  100 mL Intravenous Once PRN Medication Radiologist, MD   100 mL at 01/05/12 2005  . levothyroxine (SYNTHROID, LEVOTHROID) tablet 75 mcg  75 mcg Oral Daily Adolph Pollack, MD      . ondansetron [facility]) injection 4 mg  4 mg Intravenous Once Peter A. Patrica Duel, MD   4 mg at 01/05/12 1905  . ondansetron (ZOFRAN) injection 4 mg  4 mg Intravenous Q6H PRN Adolph Pollack, MD      . pantoprazole (PROTONIX) injection 40 mg  40 mg Intravenous QHS Adolph Pollack, MD      . sodium chloride 0.9 % bolus 1,000 mL  1,000 mL Intravenous Once Peter A. Patrica Duel, MD   1,000 mL at 01/05/12 1909  . sodium chloride 0.9 % bolus 1,000 mL  1,000 mL Intravenous Once Peter A. Patrica Duel, MD   1,000 mL at 01/05/12 2045   No current outpatient prescriptions on file as of 01/05/2012.  Current facility-administered medications:dextrose 5 % in lactated ringers infusion, , Intravenous, Continuous, Adolph Pollack, MD;  ertapenem [facility]) 1 g in sodium chloride 0.9 % 50 mL IVPB, 1 g, Intravenous, Once, Peter A. Patrica Duel, MD, 1  g at 01/05/12 2049;  ertapenem [facility]) 1 g in sodium chloride 0.9 % 50 mL IVPB, 1 g, Intravenous, Q24H, Adolph Pollack, MD HYDROmorphone (DILAUDID) injection 0.5-1 mg, 0.5-1 mg, Intravenous, Q2H PRN, Adolph Pollack, MD;  HYDROmorphone (DILAUDID) injection 1 mg, 1 mg, Intravenous, Once, Peter A. Tucich, MD, 1 mg at 01/05/12 1905;  HYDROmorphone (DILAUDID) injection 1 mg, 1 mg, Intravenous, Once, Peter A. Tucich, MD, 1 mg at 01/05/12 2050;  iohexol (OMNIPAQUE) 300 MG/ML solution 100 mL, 100 mL, Intravenous, Once PRN, Medication Radiologist, MD, 100 mL at 01/05/12 2005 levothyroxine (SYNTHROID, LEVOTHROID) tablet 75 mcg, 75 mcg, Oral, Daily, Adolph Pollack, MD;  ondansetron [facility]) injection 4 mg, 4 mg, Intravenous, Once, Peter A. Patrica Duel, MD, 4 mg at 01/05/12 1905;  ondansetron [facility]) injection 4 mg, 4 mg, Intravenous, Q6H PRN, Adolph Pollack, MD;  pantoprazole (PROTONIX) injection 40 mg, 40 mg, Intravenous, QHS, Adolph Pollack, MD sodium chloride 0.9 % bolus 1,000 mL, 1,000 mL, Intravenous, Once, Peter A. Tucich, MD, 1,000 mL at 01/05/12 1909;  sodium chloride 0.9 % bolus 1,000 mL, 1,000 mL, Intravenous, Once, Peter A. Tucich, MD, 1,000 mL at 01/05/12 2045 Current outpatient prescriptions:levothyroxine (SYNTHROID, LEVOTHROID) 75 MCG tablet, Take 75 mcg by mouth daily., Disp: , Rfl: ;  lisinopril (PRINIVIL,ZESTRIL) 20 MG tablet, Take 20 mg by mouth daily., Disp: , Rfl: ;  meloxicam (MOBIC) 7.5 MG tablet, Take 7.5 mg by mouth daily., Disp: , Rfl:   Results for orders placed during the hospital encounter of 01/05/12 (from the past 48 hour(s))  CBC     Status: Normal   Collection Time   01/05/12  7:00 PM      Component Value Range Comment   WBC 7.7  4.0 - 10.5 (K/uL)    RBC 5.40  4.22 - 5.81 (MIL/uL)    Hemoglobin 16.8  13.0 - 17.0 (g/dL)    HCT 16.1  09.6 - 04.5 (%)    MCV 86.5  78.0 - 100.0 (fL)    MCH 31.1  26.0 - 34.0 (pg)    MCHC 36.0  30.0 - 36.0 (g/dL)    RDW 40.9  81.1 - 91.4 (%)    Platelets 157  150 - 400 (K/uL)   DIFFERENTIAL     Status: Normal   Collection Time   01/05/12  7:00 PM      Component Value Range Comment   Neutrophils Relative 57  43 - 77 (%)    Neutro Abs 4.4  1.7 - 7.7 (K/uL)    Lymphocytes Relative 30  12 - 46 (%)    Lymphs Abs 2.3  0.7 - 4.0 (K/uL)    Monocytes Relative 10  3 - 12 (%)    Monocytes Absolute 0.8  0.1 - 1.0 (K/uL)    Eosinophils Relative 3  0 - 5 (%)    Eosinophils Absolute 0.2  0.0 - 0.7 (K/uL)    Basophils Relative 0  0 - 1 (%)    Basophils Absolute 0.0  0.0 - 0.1 (K/uL)   COMPREHENSIVE METABOLIC PANEL     Status: Abnormal   Collection Time   01/05/12  7:00 PM      Component Value Range Comment   Sodium 135  135 - 145 (mEq/L)    Potassium 3.9  3.5 - 5.1 (mEq/L)    Chloride 98  96 - 112 (mEq/L)    CO2 24  19 - 32 (mEq/L)    Glucose, Bld 109 (*) 70 - 99 (  mg/dL)    BUN  23  6 - 23 (mg/dL)    Creatinine, Ser 1.61  0.50 - 1.35 (mg/dL)    Calcium 09.6  8.4 - 10.5 (mg/dL)    Total Protein 7.8  6.0 - 8.3 (g/dL)    Albumin 3.9  3.5 - 5.2 (g/dL)    AST 20  0 - 37 (U/L)    ALT 26  0 - 53 (U/L)    Alkaline Phosphatase 72  39 - 117 (U/L)    Total Bilirubin 0.6  0.3 - 1.2 (mg/dL)    GFR calc non Af Amer 68 (*) >90 (mL/min)    GFR calc Af Amer 79 (*) >90 (mL/min)   LIPASE, BLOOD     Status: Normal   Collection Time   01/05/12  7:00 PM      Component Value Range Comment   Lipase 34  11 - 59 (U/L)   OCCULT BLOOD, POC DEVICE     Status: Normal   Collection Time   01/05/12  9:02 PM      Component Value Range Comment   Fecal Occult Bld NEGATIVE      Ct Abdomen Pelvis W Contrast  01/05/2012  *RADIOLOGY REPORT*  Clinical Data: Abdominal pain, guarding.  CT ABDOMEN AND PELVIS WITH CONTRAST  Technique:  Multidetector CT imaging of the abdomen and pelvis was performed following the standard protocol during bolus administration of intravenous contrast.  Contrast: OMNIPAQUE IOHEXOL 300 MG/ML IV SOLN  Comparison: None.  Findings: Minimal dependent atelectasis in the visualized lung bases.  Mild diffuse fatty infiltration of the liver without focal lesion.  Unremarkable gallbladder, spleen, adrenal glands, kidneys, pancreas, abdominal aorta stomach and small bowel decompressed. Normal appendix.  Scattered colonic diverticula.  There are inflammatory/edematous changes around the proximal sigmoid colon, a few tiny regional extraluminal gas bubbles and a small amount of free peritoneal gas.  There is a small amount of left lower quadrant mesenteric fluid without peripheral enhancement or loculation to suggest abscess.   Urinary bladder incompletely distended.  Mild prostatic prominence with central calcification. No adenopathy.  IMPRESSION:  1.  Probable sigmoid diverticulitis with perforation and small amount of free peritoneal gas. Follow up recommended as perforated carcinoma can  have a similar appearance.  2.  No evidence of abscess. I telephoned the critical test results to Dr. Patrica Duel at the time of interpretation.  Original Report Authenticated By: Osa Craver, M.D.    Review of Systems  Constitutional: Positive for chills. Negative for fever and weight loss.  HENT: Negative for congestion.   Respiratory: Negative for cough and shortness of breath.   Cardiovascular: Negative for chest pain.  Gastrointestinal: Positive for nausea, abdominal pain and blood in stool. Negative for heartburn and vomiting.  Genitourinary: Negative for dysuria and hematuria.  Endo/Heme/Allergies: Does not bruise/bleed easily.    Blood pressure 155/94, pulse 79, temperature 98.1 F (36.7 C), temperature source Oral, resp. rate 16, SpO2 100.00%. Physical Exam  Constitutional:       Overweight male who is uncomfortable  HENT:  Head: Normocephalic and atraumatic.  Eyes: Conjunctivae and EOM are normal.  Neck: Neck supple.  Cardiovascular: Normal rate and regular rhythm.   No murmur heard. Respiratory: Effort normal and breath sounds normal.  GI: Soft. He exhibits no mass. There is tenderness (in lower abdomen). There is guarding.  Genitourinary: Prostate normal.       On digital rectal exam there is no gross blood and no masses are palpable  Musculoskeletal: Normal range of motion. He exhibits no edema.  Lymphadenopathy:    He has no cervical adenopathy.  Neurological: He is alert.  Skin: Skin is warm and dry. He is not diaphoretic.  Psychiatric: He has a normal mood and affect. His behavior is normal.     Assessment/Plan Acute sigmoid diverticulitis with fairly severe surrounding inflammatory response and some small locules of air around the area and maybe one or 2 anteriorly. Certainly there is no evidence of free perforation or abscess.  Plan: Admit to the hospital. Hydrate. Bowel rest. IV Invanz. If he responds to IV antibiotics and he will need a colonoscopy as  an outpatient to definitively rule out any neoplastic etiology. If he gets worse then he may need urgent sigmoid colectomy and colostomy. I have discussed this with him and his family and they seem to understand.  Ndia Sampath J 01/05/2012, 9:06 PM

## 2012-01-05 NOTE — ED Notes (Signed)
Douglas Wiggins [phone]

## 2012-01-06 ENCOUNTER — Encounter (HOSPITAL_COMMUNITY): Payer: Self-pay | Admitting: Anesthesiology

## 2012-01-06 ENCOUNTER — Other Ambulatory Visit (INDEPENDENT_AMBULATORY_CARE_PROVIDER_SITE_OTHER): Payer: Self-pay | Admitting: Surgery

## 2012-01-06 ENCOUNTER — Other Ambulatory Visit: Payer: Self-pay

## 2012-01-06 ENCOUNTER — Encounter (HOSPITAL_COMMUNITY): Admission: EM | Disposition: A | Discharge status: Home Health | Payer: Self-pay | Source: Home / Self Care

## 2012-01-06 ENCOUNTER — Inpatient Hospital Stay (HOSPITAL_COMMUNITY): Payer: BC Managed Care – PPO | Admitting: Anesthesiology

## 2012-01-06 DIAGNOSIS — K5720 Diverticulitis of large intestine with perforation and abscess without bleeding: Secondary | ICD-10-CM | POA: Diagnosis present

## 2012-01-06 DIAGNOSIS — E039 Hypothyroidism, unspecified: Secondary | ICD-10-CM | POA: Diagnosis present

## 2012-01-06 DIAGNOSIS — K5732 Diverticulitis of large intestine without perforation or abscess without bleeding: Principal | ICD-10-CM

## 2012-01-06 DIAGNOSIS — I10 Essential (primary) hypertension: Secondary | ICD-10-CM | POA: Diagnosis present

## 2012-01-06 DIAGNOSIS — E669 Obesity, unspecified: Secondary | ICD-10-CM | POA: Diagnosis present

## 2012-01-06 HISTORY — PX: COLOSTOMY REVISION: SHX5232

## 2012-01-06 LAB — BASIC METABOLIC PANEL
CO2: 27 mEq/L (ref 19–32)
GFR calc non Af Amer: 65 mL/min — ABNORMAL LOW (ref >90)
Glucose, Bld: 129 mg/dL — ABNORMAL HIGH (ref 70–99)
Potassium: 4.3 mEq/L (ref 3.5–5.1)
Sodium: 135 mEq/L (ref 135–145)

## 2012-01-06 LAB — CBC
Hemoglobin: 15.2 g/dL (ref 13.0–17.0)
MCH: 30.5 pg (ref 26.0–34.0)
RBC: 4.98 MIL/uL (ref 4.22–5.81)
WBC: 14.1 10*3/uL — ABNORMAL HIGH (ref 4.0–10.5)

## 2012-01-06 LAB — SURGICAL PCR SCREEN: MRSA, PCR: NEGATIVE

## 2012-01-06 SURGERY — COLECTOMY, SIGMOID, OPEN
Anesthesia: General | Site: Abdomen | Wound class: Dirty or Infected

## 2012-01-06 MED ORDER — KCL IN DEXTROSE-NACL 20-5-0.45 MEQ/L-%-% IV SOLN
INTRAVENOUS | Status: DC
  Administered 2012-01-06 – 2012-01-07 (×3): via INTRAVENOUS
  Administered 2012-01-07: 125 mL via INTRAVENOUS
  Administered 2012-01-08 – 2012-01-10 (×8): via INTRAVENOUS
  Administered 2012-01-11: 50 mL/h via INTRAVENOUS
  Filled 2012-01-06 (×16): qty 1000

## 2012-01-06 MED ORDER — SUCCINYLCHOLINE CHLORIDE 20 MG/ML IJ SOLN
INTRAMUSCULAR | Status: DC | PRN
  Administered 2012-01-06: 140 mg via INTRAVENOUS

## 2012-01-06 MED ORDER — NEOSTIGMINE METHYLSULFATE 1 MG/ML IJ SOLN
INTRAMUSCULAR | Status: DC | PRN
  Administered 2012-01-06: 4.5 mg via INTRAVENOUS

## 2012-01-06 MED ORDER — METOPROLOL TARTRATE 1 MG/ML IV SOLN
10.0000 mg | Freq: Four times a day (QID) | INTRAVENOUS | Status: DC
  Administered 2012-01-06 – 2012-01-11 (×17): 10 mg via INTRAVENOUS
  Filled 2012-01-06 (×23): qty 10

## 2012-01-06 MED ORDER — PROMETHAZINE HCL 25 MG/ML IJ SOLN: 6.2500 mg | INTRAMUSCULAR | Status: DC | PRN

## 2012-01-06 MED ORDER — ONDANSETRON HCL 4 MG PO TABS: 4.0000 mg | ORAL_TABLET | Freq: Four times a day (QID) | ORAL | Status: DC | PRN

## 2012-01-06 MED ORDER — SODIUM CHLORIDE 0.9 % IJ SOLN: 9.0000 mL | INTRAMUSCULAR | Status: DC | PRN

## 2012-01-06 MED ORDER — ONDANSETRON HCL 4 MG/2ML IJ SOLN: 4.0000 mg | Freq: Four times a day (QID) | INTRAMUSCULAR | Status: DC | PRN

## 2012-01-06 MED ORDER — ACETAMINOPHEN 10 MG/ML IV SOLN
INTRAVENOUS | Status: DC | PRN
  Administered 2012-01-06: 1000 mg via INTRAVENOUS

## 2012-01-06 MED ORDER — HYDROMORPHONE 0.3 MG/ML IV SOLN
INTRAVENOUS | Status: AC
  Administered 2012-01-07: 2.4 mg via INTRAVENOUS
  Filled 2012-01-06: qty 25

## 2012-01-06 MED ORDER — LIDOCAINE HCL (CARDIAC) 20 MG/ML IV SOLN
INTRAVENOUS | Status: DC | PRN
  Administered 2012-01-06: 100 mg via INTRAVENOUS

## 2012-01-06 MED ORDER — NALOXONE HCL 0.4 MG/ML IJ SOLN: 0.4000 mg | INTRAMUSCULAR | Status: DC | PRN

## 2012-01-06 MED ORDER — HYDROMORPHONE HCL PF 1 MG/ML IJ SOLN
INTRAMUSCULAR | Status: DC | PRN
  Administered 2012-01-06: 2 mg via INTRAVENOUS
  Administered 2012-01-06 (×2): 1 mg via INTRAVENOUS

## 2012-01-06 MED ORDER — SODIUM CHLORIDE 0.9 % IV SOLN
1.0000 g | INTRAVENOUS | Status: DC
  Administered 2012-01-06 – 2012-01-10 (×5): 1 g via INTRAVENOUS
  Filled 2012-01-06 (×7): qty 1

## 2012-01-06 MED ORDER — HYDROMORPHONE HCL PF 1 MG/ML IJ SOLN
0.2500 mg | INTRAMUSCULAR | Status: DC | PRN
  Administered 2012-01-06 (×4): 0.5 mg via INTRAVENOUS

## 2012-01-06 MED ORDER — MEPERIDINE HCL 50 MG/ML IJ SOLN: 6.2500 mg | INTRAMUSCULAR | Status: DC | PRN

## 2012-01-06 MED ORDER — GLYCOPYRROLATE 0.2 MG/ML IJ SOLN
INTRAMUSCULAR | Status: DC | PRN
  Administered 2012-01-06: .6 mg via INTRAVENOUS

## 2012-01-06 MED ORDER — MIDAZOLAM HCL 5 MG/5ML IJ SOLN
INTRAMUSCULAR | Status: DC | PRN
  Administered 2012-01-06: 2 mg via INTRAVENOUS

## 2012-01-06 MED ORDER — DIPHENHYDRAMINE HCL 50 MG/ML IJ SOLN
12.5000 mg | Freq: Four times a day (QID) | INTRAMUSCULAR | Status: DC | PRN
  Administered 2012-01-06 – 2012-01-09 (×3): 12.5 mg via INTRAVENOUS
  Filled 2012-01-06 (×3): qty 1

## 2012-01-06 MED ORDER — ONDANSETRON HCL 4 MG/2ML IJ SOLN
INTRAMUSCULAR | Status: DC | PRN
  Administered 2012-01-06: 4 mg via INTRAVENOUS

## 2012-01-06 MED ORDER — FENTANYL CITRATE 0.05 MG/ML IJ SOLN
INTRAMUSCULAR | Status: DC | PRN
  Administered 2012-01-06 (×2): 100 ug via INTRAVENOUS
  Administered 2012-01-06: 150 ug via INTRAVENOUS

## 2012-01-06 MED ORDER — ACETAMINOPHEN 325 MG PO TABS
650.0000 mg | ORAL_TABLET | ORAL | Status: DC | PRN
  Administered 2012-01-06 – 2012-01-08 (×3): 650 mg via ORAL
  Filled 2012-01-06 (×3): qty 2

## 2012-01-06 MED ORDER — LACTATED RINGERS IV SOLN
INTRAVENOUS | Status: DC | PRN
  Administered 2012-01-06 (×4): via INTRAVENOUS

## 2012-01-06 MED ORDER — ENOXAPARIN SODIUM 40 MG/0.4ML ~~LOC~~ SOLN
40.0000 mg | SUBCUTANEOUS | Status: DC
  Administered 2012-01-07 – 2012-01-11 (×5): 40 mg via SUBCUTANEOUS
  Filled 2012-01-06 (×5): qty 0.4

## 2012-01-06 MED ORDER — 0.9 % SODIUM CHLORIDE (POUR BTL) OPTIME
TOPICAL | Status: DC | PRN
  Administered 2012-01-06: 2000 mL

## 2012-01-06 MED ORDER — HYDROMORPHONE 0.3 MG/ML IV SOLN
INTRAVENOUS | Status: DC
  Administered 2012-01-06: 15:00:00 via INTRAVENOUS
  Administered 2012-01-06: 5.4 mg via INTRAVENOUS
  Administered 2012-01-06: 23:00:00 via INTRAVENOUS
  Administered 2012-01-06: 0.3 mg via INTRAVENOUS
  Administered 2012-01-07: 2.33 mg via INTRAVENOUS
  Administered 2012-01-07: 3 mg via INTRAVENOUS
  Administered 2012-01-07: 1.5 mg via INTRAVENOUS
  Administered 2012-01-07: 1.18 mg via INTRAVENOUS
  Administered 2012-01-08: 0.9 mg via INTRAVENOUS
  Administered 2012-01-08: 0.6 mg via INTRAVENOUS
  Administered 2012-01-08: 0.3 mg via INTRAVENOUS
  Administered 2012-01-08: 19:00:00 via INTRAVENOUS
  Administered 2012-01-08 (×2): 0.3 mg via INTRAVENOUS
  Administered 2012-01-09: 0.6 mg via INTRAVENOUS
  Administered 2012-01-09: 2.4 mg via INTRAVENOUS
  Administered 2012-01-09: 0.6 mg via INTRAVENOUS
  Administered 2012-01-09: 1.2 mg via INTRAVENOUS
  Administered 2012-01-09: 1.8 mg via INTRAVENOUS
  Administered 2012-01-10: 0.3 mg via INTRAVENOUS

## 2012-01-06 MED ORDER — DIPHENHYDRAMINE HCL 50 MG/ML IJ SOLN
12.5000 mg | INTRAMUSCULAR | Status: AC
  Administered 2012-01-06: 12.5 mg via INTRAVENOUS

## 2012-01-06 MED ORDER — FENTANYL CITRATE 0.05 MG/ML IJ SOLN
50.0000 ug | INTRAMUSCULAR | Status: DC | PRN
  Administered 2012-01-06: 100 ug via INTRAVENOUS

## 2012-01-06 MED ORDER — ROCURONIUM BROMIDE 100 MG/10ML IV SOLN
INTRAVENOUS | Status: DC | PRN
  Administered 2012-01-06: 40 mg via INTRAVENOUS
  Administered 2012-01-06 (×2): 20 mg via INTRAVENOUS

## 2012-01-06 MED ORDER — DIPHENHYDRAMINE HCL 12.5 MG/5ML PO ELIX
12.5000 mg | ORAL_SOLUTION | Freq: Four times a day (QID) | ORAL | Status: DC | PRN
  Filled 2012-01-06 (×2): qty 5

## 2012-01-06 MED ORDER — LACTATED RINGERS IV SOLN
INTRAVENOUS | Status: DC
  Administered 2012-01-06: 1000 mL via INTRAVENOUS

## 2012-01-06 MED ORDER — PROPOFOL 10 MG/ML IV EMUL
INTRAVENOUS | Status: DC | PRN
  Administered 2012-01-06: 200 mg via INTRAVENOUS

## 2012-01-06 SURGICAL SUPPLY — 41 items
APPLICATOR COTTON TIP 6IN STRL (MISCELLANEOUS) IMPLANT
BARRIER SKIN 2 3/4 (OSTOMY) ×3 IMPLANT
BLADE EXTENDED COATED 6.5IN (ELECTRODE) ×3 IMPLANT
BLADE HEX COATED 2.75 (ELECTRODE) ×3 IMPLANT
CANISTER SUCTION 2500CC (MISCELLANEOUS) ×3 IMPLANT
CLAMP POUCH DRAINAGE QUIET (OSTOMY) ×3 IMPLANT
CLOTH BEACON ORANGE TIMEOUT ST (SAFETY) ×3 IMPLANT
COVER MAYO STAND STRL (DRAPES) ×3 IMPLANT
COVER SURGICAL LIGHT HANDLE (MISCELLANEOUS) ×3 IMPLANT
DRAPE LAPAROSCOPIC ABDOMINAL (DRAPES) ×3 IMPLANT
DRAPE UTILITY XL STRL (DRAPES) ×3 IMPLANT
DRAPE WARM FLUID 44X44 (DRAPE) ×3 IMPLANT
DRSG VAC ATS LRG SENSATRAC (GAUZE/BANDAGES/DRESSINGS) ×3 IMPLANT
ELECT REM PT RETURN 9FT ADLT (ELECTROSURGICAL) ×3
ELECTRODE REM PT RTRN 9FT ADLT (ELECTROSURGICAL) ×2 IMPLANT
GLOVE BIO SURGEON STRL SZ7 (GLOVE) ×3 IMPLANT
GLOVE BIOGEL PI IND STRL 7.0 (GLOVE) ×2 IMPLANT
GLOVE BIOGEL PI IND STRL 7.5 (GLOVE) ×2 IMPLANT
GLOVE BIOGEL PI INDICATOR 7.0 (GLOVE) ×1
GLOVE BIOGEL PI INDICATOR 7.5 (GLOVE) ×1
GOWN STRL NON-REIN LRG LVL3 (GOWN DISPOSABLE) ×6 IMPLANT
GOWN STRL REIN XL XLG (GOWN DISPOSABLE) ×3 IMPLANT
KIT BASIN OR (CUSTOM PROCEDURE TRAY) ×3 IMPLANT
LIGASURE IMPACT 36 18CM CVD LR (INSTRUMENTS) ×3 IMPLANT
NS IRRIG 1000ML POUR BTL (IV SOLUTION) ×6 IMPLANT
PACK GENERAL/GYN (CUSTOM PROCEDURE TRAY) ×3 IMPLANT
RELOAD PROXIMATE 75MM BLUE (ENDOMECHANICALS) ×3 IMPLANT
SPONGE GAUZE 4X4 12PLY (GAUZE/BANDAGES/DRESSINGS) ×3 IMPLANT
SPONGE LAP 18X18 X RAY DECT (DISPOSABLE) IMPLANT
STAPLER PROXIMATE 75MM BLUE (STAPLE) ×3 IMPLANT
STAPLER VISISTAT 35W (STAPLE) IMPLANT
SUCTION POOLE TIP (SUCTIONS) ×3 IMPLANT
SUT PDS AB 1 CTX 36 (SUTURE) IMPLANT
SUT SILK 2 0 SH CR/8 (SUTURE) ×3 IMPLANT
SUT SILK 3 0 (SUTURE)
SUT SILK 3 0 SH CR/8 (SUTURE) ×3 IMPLANT
SUT SILK 3-0 18XBRD TIE 12 (SUTURE) IMPLANT
TOWEL OR 17X26 10 PK STRL BLUE (TOWEL DISPOSABLE) ×6 IMPLANT
TRAY FOLEY CATH 14FRSI W/METER (CATHETERS) ×3 IMPLANT
WATER STERILE IRR 1500ML POUR (IV SOLUTION) IMPLANT
YANKAUER SUCT BULB TIP NO VENT (SUCTIONS) ×3 IMPLANT

## 2012-01-06 NOTE — Op Note (Signed)
Preop diagnosis: Perforated sigmoid diverticulitis Postop diagnosis: Same Procedure performed: Exploratory laparotomy with sigmoid colectomy and descending colostomy (Hartman's procedure) Surgeon:Ammar Moffatt K. Assistant: Dr. Emelia Loron Anesthesia: Gen. endotracheal Indications: This is a 46 year old male who presents with a two-week history of low abdominal pain. Yesterday this became worse and he can't emerged department for evaluation. He was noted to have perforated diverticulitis but he appeared stable and was afebrile. He was admitted overnight to the hospital and was started on intravenous antibiotics. Through the night his symptoms of worsening is now become febrile. The decision has been made to proceed with surgery. He understands that he will end up with a temporary colostomy.  Description of procedure: Patient brought to the operating room placed in supine position on the operating room table. After an adequate level of general anesthesia was obtained, a Foley catheter was placed in sterile technique. A timeout was taken to ensure the proper patient proper procedure. We shaved and prepped his abdomen with chlor prep and draped sterile fashion. We made a vertical midline incision. Dissection was carried down to the fascia. The fascia was divided vertically. We entered the peritoneal cavity bluntly. We opened the incision widely. A large amount of purulent fluid was noted in the lower abdomen. We placed a Bookwalter retractor. The patient has a large amount of fairly redundant sigmoid colon. In the proximal sigmoid colon in the left lower quadrant there is an area with a perforation and obvious inflammation and infection. The colon above and below this area appears relatively normal. The surrounding small bowel shows some reactive inflammatory changes. We mobilized the descending colon by dividing the white line of Toldt with cautery. We mobilized the descending colon medially. We  mobilized the sigmoid colon up out of the pelvis. There seemed to be a fairly short segment of colon that seems to be involved. We divided the proximal sigmoid colon just proximal to the area of inflammation with a GIA-75 stapler. We divided the miso: With the LigaSure device. When we were distal to the area of inflammation we divided the mid sigmoid colon with a another GIA-75 stapler. The specimen was oriented with a suture proximal. This was sent for pathologic examination. We then thoroughly irrigated the abdomen. The rectal stump was tagged with a 2-0 Prolene suture. The remaining small bowel shows some secondary inflammation but no sign of obstruction. We had previously had the patient's abdomen marked for colostomy. We removed a circle of skin and subcutaneous fat. The fascia was divided vertically. The inferior epigastric vessel was bleeding so we ligated it. We then mobilized the descending colon and brought out through the ostomy site after dilating this up to 3 fingers. We again irrigated thoroughly. The fascia was reapproximated with double-stranded #1 PDS suture. The subcutaneous tissues were irrigated. We stapled the skin around the umbilicus and then placed a VAC sponge in the proximal and distal portions of the wound. These sponges were placed to 125 mm Hg of suction.  The colostomy was then matured with a 3-0 Vicryl interrupted suture. An ostomy appliance was cut to fit. The patient was then extubated and brought to the recovery room in stable condition. All sponge, initially, and needle counts are correct.  [name]. [name], MD, [facility]  01/06/2012 1:49 PM

## 2012-01-06 NOTE — Transfer of Care (Signed)
Immediate Anesthesia Transfer of Care Note  Patient: Douglas Wiggins  Procedure(s) Performed:  COLON RESECTION SIGMOID - with hartmans procedure  Patient Location: PACU  Anesthesia Type: General  Level of Consciousness: awake, alert , oriented, patient cooperative and responds to stimulation  Airway & Oxygen Therapy: Patient Spontanous Breathing and Patient connected to face mask oxygen  Post-op Assessment: Report given to PACU RN, Post -op Vital signs reviewed and stable and Patient moving all extremities  Post vital signs: Reviewed and stable  Complications: No apparent anesthesia complications

## 2012-01-06 NOTE — Progress Notes (Signed)
Dr Abbey Chatters notified of pt temp 101.7 and pain level.  Orders received at this time

## 2012-01-06 NOTE — Progress Notes (Signed)
Subjective: Very tender and painful abdomen.  WBC up, Temp up to 102.4 last PM  Objective: Vital signs in last 24 hours: Temp:  [98.1 F (36.7 C)-102.4 F (39.1 C)] 99.7 F (37.6 C) (02/04 0448) Pulse Rate:  [79-124] 102  (02/04 0448) Resp:  [16-21] 20  (02/04 0448) BP: (107-158)/(63-94) 115/71 mmHg (02/04 0448) SpO2:  [91 %-100 %] 93 % (02/04 0448) Weight:  [115.667 kg (255 lb)] 115.667 kg (255 lb) (02/03 2231) Last BM Date: 01/04/12  Intake/Output from previous day: 02/03 0701 - 02/04 0700 In: 1385 [I.V.:1385] Out: 400 [Urine:400] Intake/Output this shift:    PE: Alert, having allot of pain.  Chest: Clear  Abd:  Diffusely   Tender, mild distension, +umbilical hernia, it's also tender.  Lab Results:   Basename 01/06/12 0330 01/05/12 1900  WBC 14.1* 7.7  HGB 15.2 16.8  HCT 43.5 46.7  PLT 155 157    Lab 01/05/12 1900  AST 20  ALT 26  ALKPHOS 72  BILITOT 0.6  PROT 7.8  ALBUMIN 3.9    BMET  Basename 01/06/12 0330 01/05/12 1900  NA 135 135  K 4.3 3.9  CL 98 98  CO2 27 24  GLUCOSE 129* 109*  BUN 20 23  CREATININE 1.31 1.27  CALCIUM 9.3 10.2   PT/INR No results found for this basename: LABPROT:2,INR:2 in the last 72 hours   Studies/Results: Ct Abdomen Pelvis W Contrast  01/05/2012  *RADIOLOGY REPORT*  Clinical Data: Abdominal pain, guarding.  CT ABDOMEN AND PELVIS WITH CONTRAST  Technique:  Multidetector CT imaging of the abdomen and pelvis was performed following the standard protocol during bolus administration of intravenous contrast.  Contrast: OMNIPAQUE IOHEXOL 300 MG/ML IV SOLN  Comparison: None.  Findings: Minimal dependent atelectasis in the visualized lung bases.  Mild diffuse fatty infiltration of the liver without focal lesion.  Unremarkable gallbladder, spleen, adrenal glands, kidneys, pancreas, abdominal aorta stomach and small bowel decompressed. Normal appendix.  Scattered colonic diverticula.  There are inflammatory/edematous changes  around the proximal sigmoid colon, a few tiny regional extraluminal gas bubbles and a small amount of free peritoneal gas.  There is a small amount of left lower quadrant mesenteric fluid without peripheral enhancement or loculation to suggest abscess.   Urinary bladder incompletely distended.  Mild prostatic prominence with central calcification. No adenopathy.  IMPRESSION:  1.  Probable sigmoid diverticulitis with perforation and small amount of free peritoneal gas. Follow up recommended as perforated carcinoma can have a similar appearance.  2.  No evidence of abscess. I telephoned the critical test results to Dr. Patrica Duel at the time of interpretation.  Original Report Authenticated By: Osa Craver, M.D.    Anti-infectives: Anti-infectives     Start     Dose/Rate Route Frequency Ordered Stop   01/06/12 2100   ertapenem [facility]) 1 g in sodium chloride 0.9 % 50 mL IVPB        1 g 100 mL/hr over 30 Minutes Intravenous Every 24 hours 01/05/12 2105     01/05/12 2030   ertapenem (INVANZ) 1 g in sodium chloride 0.9 % 50 mL IVPB        1 g 100 mL/hr over 30 Minutes Intravenous  Once 01/05/12 2024 01/05/12 2119         Current Facility-Administered Medications  Medication Dose Route Frequency Provider Last Rate Last Dose  . acetaminophen (TYLENOL) tablet 650 mg  650 mg Oral Once Peter A. Patrica Duel, MD   650 mg at 01/05/12  2140  . acetaminophen (TYLENOL) tablet 650 mg  650 mg Oral Q4H PRN Adolph Pollack, MD   650 mg at 01/06/12 0223  . dextrose 5 % in lactated ringers infusion   Intravenous Continuous Adolph Pollack, MD 150 mL/hr at 01/06/12 0449    . ertapenem (INVANZ) 1 g in sodium chloride 0.9 % 50 mL IVPB  1 g Intravenous Once Peter A. Patrica Duel, MD   1 g at 01/05/12 2049  . ertapenem (INVANZ) 1 g in sodium chloride 0.9 % 50 mL IVPB  1 g Intravenous Q24H Adolph Pollack, MD      . HYDROmorphone (DILAUDID) injection 0.5-1 mg  0.5-1 mg Intravenous Q2H PRN Adolph Pollack, MD   1 mg  at 01/06/12 0654  . HYDROmorphone (DILAUDID) injection 1 mg  1 mg Intravenous Once Peter A. Patrica Duel, MD   1 mg at 01/05/12 1905  . HYDROmorphone (DILAUDID) injection 1 mg  1 mg Intravenous Once Peter A. Patrica Duel, MD   1 mg at 01/05/12 2050  . iohexol (OMNIPAQUE) 300 MG/ML solution 100 mL  100 mL Intravenous Once PRN Medication Radiologist, MD   100 mL at 01/05/12 2005  . levothyroxine (SYNTHROID, LEVOTHROID) tablet 75 mcg  75 mcg Oral Daily Adolph Pollack, MD      . ondansetron [facility]) injection 4 mg  4 mg Intravenous Once Peter A. Patrica Duel, MD   4 mg at 01/05/12 1905  . ondansetron (ZOFRAN) injection 4 mg  4 mg Intravenous Q6H PRN Adolph Pollack, MD      . pantoprazole (PROTONIX) injection 40 mg  40 mg Intravenous QHS Adolph Pollack, MD   40 mg at 01/06/12 0031  . sodium chloride 0.9 % bolus 1,000 mL  1,000 mL Intravenous Once Peter A. Patrica Duel, MD   1,000 mL at 01/05/12 1909  . sodium chloride 0.9 % bolus 1,000 mL  1,000 mL Intravenous Once Peter A. Patrica Duel, MD   1,000 mL at 01/05/12 2045    Assessment/Plan Acute Sigmoid Diverticulitis, with microperforation Hypertension Thyroid Disease Obesity (BMI 31)  Plan: NPO, discuss with DR.Tsuei will plan to send to OR today.   LOS: 1 day    Douglas Wiggins 01/06/2012

## 2012-01-06 NOTE — Progress Notes (Signed)
Patient sign and examined.  He now has diffuse abdominal tenderness whereas he only had lower abdominal tenderness last night.  He also has fever, tachycardia and leukocytosis.  I needs a urgent partial colectomy and colostomy and he is agreeable with this.  Will try and have the WOC nurse mark him for a descending colostomy.  I have explained the procedure and risks of colon resection with colostomy.  Risks include but are not limited to bleeding, infection, wound problems, anesthesia, injury to intraabominal organs (such as intestine, spleen, kidney, bladder, ureter, etc.).  He seems to understand and agrees to proceed.

## 2012-01-06 NOTE — Anesthesia Preprocedure Evaluation (Addendum)
Anesthesia Evaluation  Patient identified by MRN, date of birth, ID band Patient awake    Reviewed: Allergy & Precautions, H&P , NPO status , Patient's Chart, lab work & pertinent test results  Airway Mallampati: II TM Distance: >3 FB Neck ROM: Full    Dental No notable dental hx. (+) Dental Advisory Given and Missing   Pulmonary sleep apnea ,  clear to auscultation  Pulmonary exam normal       Cardiovascular hypertension, Pt. on medications neg cardio ROS Regular Normal    Neuro/Psych Negative Neurological ROS  Negative Psych ROS   GI/Hepatic negative GI ROS, Neg liver ROS,   Endo/Other  Negative Endocrine ROSHypothyroidism   Renal/GU negative Renal ROS  Genitourinary negative   Musculoskeletal negative musculoskeletal ROS (+)   Abdominal   Peds negative pediatric ROS (+)  Hematology negative hematology ROS (+)   Anesthesia Other Findings   Reproductive/Obstetrics negative OB ROS                           Anesthesia Physical Anesthesia Plan  ASA: III and Emergent  Anesthesia Plan: General   Post-op Pain Management:    Induction: Intravenous and Rapid sequence  Airway Management Planned: Oral ETT  Additional Equipment:   Intra-op Plan:   Post-operative Plan: Extubation in OR  Informed Consent: I have reviewed the patients History and Physical, chart, labs and discussed the procedure including the risks, benefits and alternatives for the proposed anesthesia with the patient or authorized representative who has indicated his/her understanding and acceptance.   Dental advisory given  Plan Discussed with: CRNA  Anesthesia Plan Comments:         Anesthesia Quick Evaluation

## 2012-01-06 NOTE — Consult Note (Signed)
WOC ostomy consult  Stoma type/location: Preop stoma site marking per MD request. (Urgent) Education provided: patient instructed that should a stoma be created in the OR that there is a nurse trained to help him manage and adjust to this surgery.  The staff is familiar with caring for individuals with ostomies and he is in good hands. Site: Site selected after viewing patient in the sitting, lying and standing positions.  Site marked is 8cm to left of umbilicus and 3cm below in patient's field of vision.  Patient has an unbilical hernia, wears his pants well below the stoma site marked. I will follow with you as indicated. Thanks, Ladona Mow, MSn, RN, GNp, CWOCN [phone])

## 2012-01-06 NOTE — Anesthesia Postprocedure Evaluation (Signed)
  Anesthesia Post-op Note  Patient: Douglas Wiggins  Procedure(s) Performed:  COLON RESECTION SIGMOID - with hartmans procedure  Patient Location: PACU  Anesthesia Type: General  Level of Consciousness: awake and alert   Airway and Oxygen Therapy: Patient Spontanous Breathing  Post-op Pain: mild  Post-op Assessment: Post-op Vital signs reviewed, Patient's Cardiovascular Status Stable, Respiratory Function Stable, Patent Airway and No signs of Nausea or vomiting  Post-op Vital Signs: stable  Complications: No apparent anesthesia complications

## 2012-01-06 NOTE — Preoperative (Signed)
Beta Blockers   Reason not to administer Beta Blockers:Not Applicable 

## 2012-01-07 MED ORDER — KETOROLAC TROMETHAMINE 30 MG/ML IJ SOLN
30.0000 mg | Freq: Four times a day (QID) | INTRAMUSCULAR | Status: DC
  Administered 2012-01-07 – 2012-01-11 (×15): 30 mg via INTRAVENOUS
  Filled 2012-01-07 (×19): qty 1

## 2012-01-07 MED ORDER — HYDROMORPHONE 0.3 MG/ML IV SOLN
INTRAVENOUS | Status: AC
  Administered 2012-01-07: 1.18 mg via INTRAVENOUS
  Filled 2012-01-07: qty 25

## 2012-01-07 NOTE — Progress Notes (Signed)
1 Day Post-Op  Subjective: C/o being sore and some itching, but over all looks pretty good. NG sump is wet, and cannister is full.  I cant tell how much he's had thru NG.  Objective: Vital signs in last 24 hours: Temp:  [97.4 F (36.3 C)-101.2 F (38.4 C)] 99.9 F (37.7 C) (02/05 0600) Pulse Rate:  [86-105] 105  (02/05 0600) Resp:  [15-22] 18  (02/05 0810) BP: (136-178)/(58-117) 140/92 mmHg (02/05 0600) SpO2:  [91 %-97 %] 92 % (02/05 0810) Last BM Date: 01/04/12  Intake/Output from previous day: 02/04 0701 - 02/05 0700 In: 4376.8 [I.V.:4376.8] Out: 3075 [Urine:2850; Emesis/NG output:200; Blood:25] Intake/Output this shift: Total I/O In: -  Out: 200 [Urine:200]  PE:  Alert, chest:  Few rales in base,  Abd; tender, Wound vac in place.  No bowel sounds.  Ostomy looks good, clear fluid in Ostomy bag. Lab Results:   Basename 01/06/12 0330 01/05/12 1900  WBC 14.1* 7.7  HGB 15.2 16.8  HCT 43.5 46.7  PLT 155 157    Lab 01/05/12 1900  AST 20  ALT 26  ALKPHOS 72  BILITOT 0.6  PROT 7.8  ALBUMIN 3.9    BMET  Basename 01/06/12 0330 01/05/12 1900  NA 135 135  K 4.3 3.9  CL 98 98  CO2 27 24  GLUCOSE 129* 109*  BUN 20 23  CREATININE 1.31 1.27  CALCIUM 9.3 10.2   PT/INR No results found for this basename: LABPROT:2,INR:2 in the last 72 hours   Studies/Results: Ct Abdomen Pelvis W Contrast  01/05/2012  *RADIOLOGY REPORT*  Clinical Data: Abdominal pain, guarding.  CT ABDOMEN AND PELVIS WITH CONTRAST  Technique:  Multidetector CT imaging of the abdomen and pelvis was performed following the standard protocol during bolus administration of intravenous contrast.  Contrast: OMNIPAQUE IOHEXOL 300 MG/ML IV SOLN  Comparison: None.  Findings: Minimal dependent atelectasis in the visualized lung bases.  Mild diffuse fatty infiltration of the liver without focal lesion.  Unremarkable gallbladder, spleen, adrenal glands, kidneys, pancreas, abdominal aorta stomach and small bowel  decompressed. Normal appendix.  Scattered colonic diverticula.  There are inflammatory/edematous changes around the proximal sigmoid colon, a few tiny regional extraluminal gas bubbles and a small amount of free peritoneal gas.  There is a small amount of left lower quadrant mesenteric fluid without peripheral enhancement or loculation to suggest abscess.   Urinary bladder incompletely distended.  Mild prostatic prominence with central calcification. No adenopathy.  IMPRESSION:  1.  Probable sigmoid diverticulitis with perforation and small amount of free peritoneal gas. Follow up recommended as perforated carcinoma can have a similar appearance.  2.  No evidence of abscess. I telephoned the critical test results to Dr. Patrica Duel at the time of interpretation.  Original Report Authenticated By: Osa Craver, M.D.    Anti-infectives: Anti-infectives     Start     Dose/Rate Route Frequency Ordered Stop   01/06/12 2100   ertapenem [facility]) 1 g in sodium chloride 0.9 % 50 mL IVPB  Status:  Discontinued        1 g 100 mL/hr over 30 Minutes Intravenous Every 24 hours 01/05/12 2105 01/06/12 1526   01/06/12 2000   ertapenem (INVANZ) 1 g in sodium chloride 0.9 % 50 mL IVPB        1 g 100 mL/hr over 30 Minutes Intravenous Every 24 hours 01/06/12 1526     01/05/12 2030   ertapenem (INVANZ) 1 g in sodium chloride 0.9 % 50  mL IVPB        1 g 100 mL/hr over 30 Minutes Intravenous  Once 01/05/12 2024 01/05/12 2119         Current Facility-Administered Medications  Medication Dose Route Frequency Provider Last Rate Last Dose  . acetaminophen (TYLENOL) tablet 650 mg  650 mg Oral Q4H PRN Adolph Pollack, MD   650 mg at 01/07/12 0807  . dextrose 5 % and 0.45 % NaCl with KCl 20 mEq/L infusion   Intravenous Continuous [name]. Tsuei, MD 125 mL/hr at 01/07/12 0830    . dextrose 5 % in lactated ringers infusion   Intravenous Continuous Adolph Pollack, MD 150 mL/hr at 01/06/12 0449    .  diphenhydrAMINE (BENADRYL) injection 12.5 mg  12.5 mg Intravenous Q6H PRN [name]. Tsuei, MD   12.5 mg at 01/07/12 0830   Or  . diphenhydrAMINE (BENADRYL) 12.5 MG/5ML elixir 12.5 mg  12.5 mg Oral Q6H PRN [name]. Tsuei, MD      . diphenhydrAMINE (BENADRYL) injection 12.5 mg  12.5 mg Intravenous NOW Sherrie George, PA   12.5 mg at 01/06/12 1648  . enoxaparin (LOVENOX) injection 40 mg  40 mg Subcutaneous Q24H [name]. Tsuei, MD   40 mg at 01/07/12 0807  . ertapenem (INVANZ) 1 g in sodium chloride 0.9 % 50 mL IVPB  1 g Intravenous Q24H [name]. Tsuei, MD   1 g at 01/06/12 2024  . HYDROmorphone (DILAUDID) PCA injection 0.3 mg/mL   Intravenous Q4H [name]. Tsuei, MD   2.4 mg at 01/07/12 0810  . levothyroxine (SYNTHROID, LEVOTHROID) tablet 75 mcg  75 mcg Oral Daily Adolph Pollack, MD      . metoprolol (LOPRESSOR) injection 10 mg  10 mg Intravenous Q6H Mariella Saa, MD   10 mg at 01/06/12 2238  . naloxone [facility]) injection 0.4 mg  0.4 mg Intravenous PRN [name]. Tsuei, MD       And  . sodium chloride 0.9 % injection 9 mL  9 mL Intravenous PRN [name]. Tsuei, MD      . ondansetron (ZOFRAN) tablet 4 mg  4 mg Oral Q6H PRN [name]. Tsuei, MD       Or  . ondansetron (ZOFRAN) injection 4 mg  4 mg Intravenous Q6H PRN [name]. Tsuei, MD      . pantoprazole (PROTONIX) injection 40 mg  40 mg Intravenous QHS Adolph Pollack, MD   40 mg at 01/06/12 2237  . DISCONTD: 0.9 % irrigation (POUR BTL)    PRN [name]. Tsuei, MD   2,000 mL at 01/06/12 1159  . DISCONTD: ertapenem (INVANZ) 1 g in sodium chloride 0.9 % 50 mL IVPB  1 g Intravenous Q24H Adolph Pollack, MD      . DISCONTD: fentaNYL (SUBLIMAZE) injection 50-100 mcg  50-100 mcg Intravenous PRN Riesa Pope, MD   100 mcg at 01/06/12 1043  . DISCONTD: HYDROmorphone (DILAUDID) injection 0.25-0.5 mg  0.25-0.5 mg Intravenous Q5 min PRN Riesa Pope, MD   0.5 mg at 01/06/12 1440  . DISCONTD: HYDROmorphone (DILAUDID) injection 0.5-1  mg  0.5-1 mg Intravenous Q2H PRN Adolph Pollack, MD   1 mg at 01/06/12 0805  . DISCONTD: lactated ringers infusion   Intravenous Continuous Riesa Pope, MD 500 mL/hr at 01/06/12 1500    . DISCONTD: meperidine (DEMEROL) injection 6.5-12.5 mg  6.5-12.5 mg Intravenous Q5 min PRN Riesa Pope, MD      .  DISCONTD: ondansetron (ZOFRAN) injection 4 mg  4 mg Intravenous Q6H PRN Adolph Pollack, MD      . DISCONTD: promethazine (PHENERGAN) injection 6.25-12.5 mg  6.25-12.5 mg Intravenous Q15 min PRN Riesa Pope, MD       Facility-Administered Medications Ordered in Other Encounters  Medication Dose Route Frequency Provider Last Rate Last Dose  . DISCONTD: acetaminophen (OFIRMEV) IV    PRN Zebedee Iba, CRNA   1,000 mg at 01/06/12 1132  . DISCONTD: fentaNYL (SUBLIMAZE) injection    PRN Zebedee Iba, CRNA   100 mcg at 01/06/12 1156  . DISCONTD: glycopyrrolate (ROBINUL) injection    PRN Tommy Rainwater Stubblefield, CRNA   0.6 mg at 01/06/12 1322  . DISCONTD: HYDROmorphone (DILAUDID) injection    PRN Zebedee Iba, CRNA   1 mg at 01/06/12 1338  . DISCONTD: lactated ringers infusion    Continuous PRN Zebedee Iba, CRNA      . DISCONTD: lidocaine (cardiac) 100 mg/70ml (XYLOCAINE) 20 MG/ML injection 2%    PRN Zebedee Iba, CRNA   100 mg at 01/06/12 1126  . DISCONTD: midazolam (VERSED) 5 MG/5ML injection    PRN Zebedee Iba, CRNA   2 mg at 01/06/12 1120  . DISCONTD: neostigmine (PROSTIGMINE) injection   Intravenous PRN Zebedee Iba, CRNA   4.5 mg at 01/06/12 1322  . DISCONTD: ondansetron (ZOFRAN) injection    PRN Zebedee Iba, CRNA   4 mg at 01/06/12 1305  . DISCONTD: propofol (DIPRIVAN) 10 MG/ML infusion    PRN Zebedee Iba, CRNA   200 mg at 01/06/12 1126  . DISCONTD: rocuronium (ZEMURON) injection    PRN Zebedee Iba, CRNA   20 mg at 01/06/12 1208  . DISCONTD: succinylcholine  (ANECTINE) injection    PRN Zebedee Iba, CRNA   140 mg at 01/06/12 1126    Assessment/Plan Perforated sigmoid diverticulitis/Exploratory laparotomy with sigmoid colectomy and descending colostomy (Hartman's procedure POD1 Acute Sigmoid Diverticulitis, with microperforation  Hypertension  Thyroid Disease  Obesity (BMI 31)   Plan: OOB to chair.  IS q1h.  Continue invanz    LOS: 2 days    Nakeesha Bowler 01/07/2012

## 2012-01-07 NOTE — Progress Notes (Signed)
Path - perforated diverticulitis with no sign of malignancy.  Awaiting bowel function.  Ambulate.  [name]. [name], MD, [facility]  01/07/2012 5:23 PM

## 2012-01-08 LAB — MAGNESIUM: Magnesium: 1.8 mg/dL (ref 1.5–2.5)

## 2012-01-08 LAB — BASIC METABOLIC PANEL
CO2: 25 mEq/L (ref 19–32)
Calcium: 8.8 mg/dL (ref 8.4–10.5)
Creatinine, Ser: 0.9 mg/dL (ref 0.50–1.35)
GFR calc Af Amer: >90 mL/min (ref >90)

## 2012-01-08 LAB — CBC
MCH: 30.2 pg (ref 26.0–34.0)
MCV: 85.8 fL (ref 78.0–100.0)
Platelets: 165 10*3/uL (ref 150–400)
RDW: 12.2 % (ref 11.5–15.5)

## 2012-01-08 MED ORDER — HYDROMORPHONE 0.3 MG/ML IV SOLN
INTRAVENOUS | Status: AC
  Filled 2012-01-08: qty 25

## 2012-01-08 MED ORDER — PHENOL 1.4 % MT LIQD
1.0000 | OROMUCOSAL | Status: DC | PRN
  Administered 2012-01-08: 1 via OROMUCOSAL
  Filled 2012-01-08: qty 177

## 2012-01-08 NOTE — Progress Notes (Signed)
2 Days Post-Op  Subjective: Pt ok. Pain control adequate. No flatus from ostomy yet. Has been OOB and doing well with IS.  Objective: Vital signs in last 24 hours: Temp:  [99.7 F (37.6 C)-100.8 F (38.2 C)] 100.2 F (37.9 C) (02/06 0535) Pulse Rate:  [82-97] 86  (02/06 0535) Resp:  [18-20] 20  (02/06 0535) BP: (145-164)/(81-102) 145/81 mmHg (02/06 0535) SpO2:  [90 %-95 %] 95 % (02/06 0535) Last BM Date: 01/04/12  Intake/Output this shift:    Physical Exam: BP 145/81  Pulse 86  Temp(Src) 100.2 F (37.9 C) (Oral)  Resp 20  Ht 6\' 4"  (1.93 m)  Wt 115.667 kg (255 lb)  BMI 31.04 kg/m2  SpO2 95% Lungs: CTA without w/r/r Heart: Regular Abdomen: soft, ND, appropriately tender   Incisions all c/d/i without erythema or hematoma. Skin Vac intact. Ext: No edema or tenderness   Labs: CBC  Basename 01/06/12 0330 01/05/12 1900  WBC 14.1* 7.7  HGB 15.2 16.8  HCT 43.5 46.7  PLT 155 157   BMET  Basename 01/06/12 0330 01/05/12 1900  NA 135 135  K 4.3 3.9  CL 98 98  CO2 27 24  GLUCOSE 129* 109*  BUN 20 23  CREATININE 1.31 1.27  CALCIUM 9.3 10.2   LFT  Basename 01/05/12 1900  PROT 7.8  ALBUMIN 3.9  AST 20  ALT 26  ALKPHOS 72  BILITOT 0.6  BILIDIR --  IBILI --  LIPASE 34   PT/INR No results found for this basename: LABPROT:2,INR:2 in the last 72 hours ABG No results found for this basename: PHART:2,PCO2:2,PO2:2,HCO3:2 in the last 72 hours  Studies/Results: No results found.  Assessment: Active Problems:  Diverticulitis of colon with perforation  Hypertension  Hypothyroid  Obesity (BMI 30-39.9)   Procedure(s): Sigmoid Colectomy with Hartman's procedure  Plan: Await return of bowel function. NGT OOB/IS Check labs  LOS: 3 days    Alyse Low 01/08/2012 8:45 AM

## 2012-01-08 NOTE — Consult Note (Signed)
WOC ostomy consult  Stoma type/location: left lower quadrant colostomy Stomal assessment/size: 1 and 3/4 inch slightly oval, pink/red, moist, raised Peristomal assessment: intact Treatment options for stomal/peristomal skin: none noted Output none at this time Ostomy pouching: 2pc., 2 and 1/4 inch system.  Pouch # [id number], Skin barrier # [id number] Education provided: Patient instructed on Lock and Roll feature of two piece pouching system.  Able to give return demonstration of this. Parents to be at bedside in am (9:30) for a teaching session. WOC consult Note Reason for Consult:midline surgical wound with NPWT Wound type:surgical Measurement: Superior wound measures 5x3x1cm and distal wound measures 8x4x3cm.  There is a mid-line incision between the two wounds (closely approximated) with 7 intact staples.  Entire incision measures 20cm. Wound bed: Pink, moist Drainage (amount, consistency, odor) minimal Periwound: intact Dressing procedure/placement/frequency: NPWT dressing at continuous pressure, black foam as wound contact layer.  Area between superior and distal wounds "bridged" to prevent dehiscence of closely approximated section. Teaching provided regarding NPWT including frequency of dressing changes and the likelihood that home nursing will be provided to oversee this wound care post discharge. Recommend that for continued ostomy teaching and support as well. Our team will follow with  You. Ladona Mow, MSN, RN, GNP, CWOCN [phone])

## 2012-01-08 NOTE — Plan of Care (Signed)
Problem: Phase I Progression Outcomes Goal: Pain controlled with appropriate interventions Outcome: Progressing Has PCA, has been using to help control pain level

## 2012-01-08 NOTE — Plan of Care (Signed)
Problem: Phase I Progression Outcomes Goal: Incision/dressings dry and intact Outcome: Progressing Has wound vac that is CDI

## 2012-01-08 NOTE — Progress Notes (Signed)
More active bowel sounds Pain better controlled with Toradol. Ambulating in halls.  No ostomy output yet. VAC changed today.  When patient is discharged home (possibly this weekend), he will not need a VAC but will switch to wet to dry dressings to his midline wound daily.  Will speak with case management about arrangements.  [name]. [name], MD, [facility]  01/08/2012 2:22 PM

## 2012-01-08 NOTE — Plan of Care (Signed)
Problem: Phase I Progression Outcomes Goal: Tubes/drains patent Outcome: Progressing Has ostomy that is attached and draining a scant amount of thin brown liquid

## 2012-01-09 MED ORDER — HYDROMORPHONE 0.3 MG/ML IV SOLN
INTRAVENOUS | Status: AC
  Filled 2012-01-09: qty 25

## 2012-01-09 NOTE — Progress Notes (Signed)
3 Days Post-Op  Subjective: Pt walking a lot and started passing flatus from ostomy. No N/V when NG clamped. Pain control good. Voiding well  Objective: Vital signs in last 24 hours: Temp:  [98.7 F (37.1 C)-99.5 F (37.5 C)] 98.7 F (37.1 C) (02/07 0830) Pulse Rate:  [75-87] 75  (02/07 0830) Resp:  [16-20] 16  (02/07 0830) BP: (127-146)/(82-91) 127/84 mmHg (02/07 0830) SpO2:  [92 %-98 %] 93 % (02/07 0830) Last BM Date: 01/04/12  Intake/Output this shift: Total I/O In: 0  Out: 450 [Urine:450]  Physical Exam: BP 127/84  Pulse 75  Temp(Src) 98.7 F (37.1 C) (Oral)  Resp 16  Ht 6\' 4"  (1.93 m)  Wt 115.667 kg (255 lb)  BMI 31.04 kg/m2  SpO2 93% Lungs: CTA without w/r/r Heart: Regular Abdomen: soft, ND, appropriately tender   Incision, VAC in place, no erythema   Ostomy pink, some gas in bag. Ext: No edema or tenderness   Labs: CBC  Basename 01/08/12 0910  WBC 10.8*  HGB 14.0  HCT 39.8  PLT 165   BMET  Basename 01/08/12 0910  NA 130*  K 3.7  CL 95*  CO2 25  GLUCOSE 129*  BUN 17  CREATININE 0.90  CALCIUM 8.8   LFT No results found for this basename: PROT,ALBUMIN,AST,ALT,ALKPHOS,BILITOT,BILIDIR,IBILI,LIPASE in the last 72 hours PT/INR No results found for this basename: LABPROT:2,INR:2 in the last 72 hours ABG No results found for this basename: PHART:2,PCO2:2,PO2:2,HCO3:2 in the last 72 hours  Studies/Results: No results found.  Assessment: Active Problems:  Diverticulitis of colon with perforation  Hypertension  Hypothyroid  Obesity (BMI 30-39.9)   Procedure(s): COLON RESECTION SIGMOID WITH COLOSTOMY  Plan: Will DC NGT today and start sips. Keep walking Cont PCA one more day Hopefully increase diet and DC PCA tomorrow,.  LOS: 4 days    Alyse Low 01/09/2012 9:53 AM

## 2012-01-09 NOTE — Progress Notes (Signed)
Lots of flatus in ostomy. Abdomen soft Will start clear liquids tonight Change VAC tomorrow. Progressing well.  [name]. [name], MD, [facility]  01/09/2012 5:38 PM

## 2012-01-09 NOTE — Plan of Care (Signed)
Problem: Phase I Progression Outcomes Goal: OOB as tolerated unless otherwise ordered Outcome: Progressing Patient was ambulating without difficulty at shift change

## 2012-01-09 NOTE — Progress Notes (Signed)
CARE MANAGEMENT NOTE 01/09/2012  Patient:  Douglas Wiggins, Douglas Wiggins   Account Number:  [id number]  Date Initiated:  01/09/2012  Documentation initiated by:  Diantha Paxson  Subjective/Objective Assessment:   46 yo male admitted 01/05/12 with abdominal pain, peforated diverticulitis     Action/Plan:   D/C when medically stable   Anticipated DC Date:  01/12/2012   Anticipated DC Plan:  HOME W HOME HEALTH SERVICES      DC Planning Services  CM consult      [facility] Choice  HOME HEALTH   Choice offered to / List presented to:  C-1 Patient        HH arranged  HH-1 RN      [facility] agency  [facility]   Status of service:  In process, will continue to follow  Comments:  01/09/12, Kathi Der RNC-MNN, BSN, [phone], CM received referral.  Cm met with pt to offer choice for [facility] services. Pt has chosen [facility] for [facility] services.  Darl Pikes at [facility] contacted and confirmation of services received.  Pt states that his parents will be able to assist him upon d/c   MEDICARE-CERTIFIED HOME HEALTH AGENCIES [facility]   Agencies that are Medicare-Certified and affiliated with [facility]  Home Health Agency  Telephone Number Address  [facility]  The [facility] has ownership interest in this company; however, you are under no obligation to use this agency. [phone]  [address], [place] [address]    Agencies that are Medicare-Certified and are not affiliated with [facility] Agency Telephone Number Address  Aldine Contes [phone] Fax [phone] [address] [place], [place]  [address]  [facility] [phone] [address] [place], [place] [address]  [facility]  [phone] Fax [phone] [address]  [place], [place]  [address]  [facility] [phone] or [phone] [address] [place], [place] [address]  [facility] [phone] or [phone] [address] [place], [place]  [address]      Agencies that are not Medicare-Certified and are not affiliated with [facility] Agency Telephone Number Address  [facility] [phone] Fax [phone] [address] [place], [place]  [address]  [facility] [phone] or [phone] Fax [phone] [address] [place], [place]  [address]  [facility]  [phone] [address] [place], [place]  [employer] [phone] Fax [phone] [address] [place], [place]  [address]  [facility] [phone] Fax [phone] [address] [place], [place]  [address]  [facility] [phone] [address] [place], [place]  [address]  [facility] [phone] Fax [phone] [address] [place], [place] [address]  [facility] [phone] [address] [place], [place]  [address]  [facility] [phone] Fax [phone] [address] [place], [place]  [address]

## 2012-01-09 NOTE — Consult Note (Signed)
WOC ostomy consult  Stoma type/location: left lower quadrant colostomy Education provided: Lengthy session with parents present on ostomy.  Basic A&P taught, also pathophysiology. Pouch characteristics, stoma characteristics, change frequency (twice weekly) and emptying frequency 4-5 times per day taught.  Emptying technique reviewed; somewhat difficult due to fact that patient is still not passing stool.  Demonstration of emptying, cleaning tail closure of pouch, sizing stoma, cutting out skin barrier and attaching pouch provided. Bathing and diet covered.  All asking appropriate questions. WOC team will follow.  Patient registered with secure start.. Thanks, Ladona Mow, MSN, RN, [facility], CWOCN [phone])

## 2012-01-10 MED ORDER — OXYCODONE-ACETAMINOPHEN 5-325 MG PO TABS
1.0000 | ORAL_TABLET | Freq: Four times a day (QID) | ORAL | Status: DC | PRN
  Administered 2012-01-10 – 2012-01-11 (×4): 1 via ORAL
  Filled 2012-01-10 (×4): qty 1

## 2012-01-10 MED ORDER — HYDROMORPHONE HCL PF 1 MG/ML IJ SOLN: 1.0000 mg | INTRAMUSCULAR | Status: DC | PRN

## 2012-01-10 NOTE — Progress Notes (Signed)
Wound examined - very clean; closing nicely; will switch to wet to dry dressings in anticipation of discharge this weekend.  Ostomy - functioning well; will advance diet.  D/c PCA Possible discharge this weekend.  [name]. [name], MD, [facility]  01/10/2012 9:40 AM

## 2012-01-10 NOTE — Consult Note (Signed)
WOC consult follow-up Note Assessed abd wound with Dr [name].  Vac dressing d/ced as ordered.  Upper abd full thickness wound 5X2X.5cm, lower abd wound 10X4X1cm.  Staples intact between sites. Both sites beefy red with small tan drainage, no odor.  Damp gauze applied as ordered.  Changed ostomy pouch as it was intertwined with vac drape.  Stoma red and viable, above skin level, small tan drainage, no stool or flatus at this time.  Applied two piece pouching system.  Pt able to close velcro to empty.   Supplies at bedside for staff use.  Pt denies further questions regarding pouching routines or ordering supplies.  Plans to have home health ast after d/c. Will not plan to follow further unless re-consulted.  [address] RN, MSN, [employer]  [phone]

## 2012-01-10 NOTE — Progress Notes (Signed)
4 Days Post-Op  Subjective: Feel pretty good.  Gas in Ostomy, and some clear liquid, not stool so far.  Ambulating and doing well.  Continue clear liquids for now.  Objective: Vital signs in last 24 hours: Temp:  [97.9 F (36.6 C)-98.7 F (37.1 C)] 98.3 F (36.8 C) (02/08 0620) Pulse Rate:  [64-85] 64  (02/08 0620) Resp:  [16-20] 20  (02/08 0620) BP: (124-138)/(78-86) 124/85 mmHg (02/08 0620) SpO2:  [90 %-98 %] 96 % (02/08 0620) Last BM Date: 01/09/12  Intake/Output from previous day: 02/07 0701 - 02/08 0700 In: 4718 [P.O.:600; I.V.:4068; IV Piggyback:50] Out: 3050 [Urine:2950; Emesis/NG output:100] Intake/Output this shift:    ZO:XWRUE, NAD pain controlled, Chest clear.  Abd: few BS, Wound vac in place, Ostomy looks good; gas and clear liquid in ostomy  Lab Results:   [facility] 01/08/12 0910  WBC 10.8*  HGB 14.0  HCT 39.8  PLT 165    Lab 01/05/12 1900  AST 20  ALT 26  ALKPHOS 72  BILITOT 0.6  PROT 7.8  ALBUMIN 3.9    BMET  Basename 01/08/12 0910  NA 130*  K 3.7  CL 95*  CO2 25  GLUCOSE 129*  BUN 17  CREATININE 0.90  CALCIUM 8.8   PT/INR No results found for this basename: LABPROT:2,INR:2 in the last 72 hours   Studies/Results: No results found.  Anti-infectives: Anti-infectives     Start     Dose/Rate Route Frequency Ordered Stop   01/06/12 2100   ertapenem (INVANZ) 1 g in sodium chloride 0.9 % 50 mL IVPB  Status:  Discontinued        1 g 100 mL/hr over 30 Minutes Intravenous Every 24 hours 01/05/12 2105 01/06/12 1526   01/06/12 2000   ertapenem (INVANZ) 1 g in sodium chloride 0.9 % 50 mL IVPB        1 g 100 mL/hr over 30 Minutes Intravenous Every 24 hours 01/06/12 1526     01/05/12 2030   ertapenem (INVANZ) 1 g in sodium chloride 0.9 % 50 mL IVPB        1 g 100 mL/hr over 30 Minutes Intravenous  Once 01/05/12 2024 01/05/12 2119         Current Facility-Administered Medications  Medication Dose Route Frequency Provider Last Rate Last  Dose  . acetaminophen (TYLENOL) tablet 650 mg  650 mg Oral Q4H PRN Adolph Pollack, MD   650 mg at 01/08/12 1352  . dextrose 5 % and 0.45 % NaCl with KCl 20 mEq/L infusion   Intravenous Continuous [name]. Tsuei, MD 125 mL/hr at 01/10/12 0357    . diphenhydrAMINE (BENADRYL) injection 12.5 mg  12.5 mg Intravenous Q6H PRN [name]. Tsuei, MD   12.5 mg at 01/09/12 0744   Or  . diphenhydrAMINE (BENADRYL) 12.5 MG/5ML elixir 12.5 mg  12.5 mg Oral Q6H PRN [name]. Tsuei, MD      . enoxaparin (LOVENOX) injection 40 mg  40 mg Subcutaneous Q24H [name]. Tsuei, MD   40 mg at 01/10/12 0755  . ertapenem (INVANZ) 1 g in sodium chloride 0.9 % 50 mL IVPB  1 g Intravenous Q24H [name]. Tsuei, MD   1 g at 01/09/12 2121  . HYDROmorphone (DILAUDID) PCA injection 0.3 mg/mL   Intravenous Q4H [name]. Tsuei, MD   0.3 mg at 01/10/12 0124  . HYDROmorphone PCA 0.3 mg/mL (DILAUDID) 0.3 mg/mL infusion           . ketorolac (TORADOL) 30  MG/ML injection 30 mg  30 mg Intravenous Q6H Matthew K. Tsuei, MD   30 mg at 01/10/12 0545  . levothyroxine (SYNTHROID, LEVOTHROID) tablet 75 mcg  75 mcg Oral Daily Adolph Pollack, MD   75 mcg at 01/09/12 [phone]  . metoprolol (LOPRESSOR) injection 10 mg  10 mg Intravenous Q6H Mariella Saa, MD   10 mg at 01/10/12 0545  . naloxone [facility]) injection 0.4 mg  0.4 mg Intravenous PRN [name]. Tsuei, MD       And  . sodium chloride 0.9 % injection 9 mL  9 mL Intravenous PRN [name]. Tsuei, MD      . ondansetron (ZOFRAN) tablet 4 mg  4 mg Oral Q6H PRN [name]. Tsuei, MD       Or  . ondansetron (ZOFRAN) injection 4 mg  4 mg Intravenous Q6H PRN [name]. Tsuei, MD      . pantoprazole (PROTONIX) injection 40 mg  40 mg Intravenous QHS Adolph Pollack, MD   40 mg at 01/09/12 2203  . phenol (CHLORASEPTIC) mouth spray 1 spray  1 spray Mouth/Throat PRN [name]. Tsuei, MD   1 spray at 01/08/12 1454  . DISCONTD: HYDROmorphone PCA 0.3 mg/mL (DILAUDID) 0.3 mg/mL infusion              Assessment/Plan Diverticulitis of colon with perforation  Hypertension  Hypothyroid  Obesity (BMI 30-39.9)   Procedure(s):  COLON RESECTION SIGMOID WITH COLOSTOMY   Plan: Continue clear and ambulation, advance diet when he has more in colostomy.  We plan to send home on Wet to dry dressing for abdomen when he is ready to go I have ask for home health to help with care at time of d/c.    LOS: 5 days    Douglas Wiggins 01/10/2012

## 2012-01-11 MED ORDER — OXYCODONE HCL 5 MG PO TABS
5.0000 mg | ORAL_TABLET | ORAL | Status: DC | PRN
  Administered 2012-01-11: 5 mg via ORAL
  Filled 2012-01-11: qty 1

## 2012-01-11 MED ORDER — MELOXICAM 7.5 MG PO TABS
7.5000 mg | ORAL_TABLET | Freq: Every day | ORAL | Status: DC
  Administered 2012-01-11: 7.5 mg via ORAL
  Filled 2012-01-11: qty 1

## 2012-01-11 MED ORDER — ACETAMINOPHEN 325 MG PO TABS: 325.0000 mg | ORAL_TABLET | Freq: Four times a day (QID) | ORAL | Status: DC | PRN

## 2012-01-11 MED ORDER — OXYCODONE HCL 5 MG PO TABS: 5.0000 mg | ORAL_TABLET | ORAL | Status: AC | PRN

## 2012-01-11 MED ORDER — AMOXICILLIN-POT CLAVULANATE 875-125 MG PO TABS: 1.0000 | ORAL_TABLET | Freq: Two times a day (BID) | ORAL | Status: AC

## 2012-01-11 MED ORDER — LISINOPRIL 20 MG PO TABS
20.0000 mg | ORAL_TABLET | Freq: Every day | ORAL | Status: DC
  Administered 2012-01-11: 20 mg via ORAL
  Filled 2012-01-11: qty 1

## 2012-01-11 MED ORDER — PANTOPRAZOLE SODIUM 40 MG PO TBEC
40.0000 mg | DELAYED_RELEASE_TABLET | Freq: Every day | ORAL | Status: DC
  Administered 2012-01-11: 40 mg via ORAL
  Filled 2012-01-11: qty 1

## 2012-01-11 NOTE — Progress Notes (Signed)
Douglas Wiggins 08-25-1966 [id number]  PCP: No primary provider on file. Outpatient Care Team: Patient has no care team.  Inpatient Treatment Team: Treatment Team: Attending Provider: Bishop Limbo, MD; Attending Physician: Md Montez Morita, MD; Registered Nurse: Mayra Neer, RN; Registered Nurse: Myrtie Hawk Means, RN; Registered Nurse: Bethann Goo, RN; Technician: Vella Raring, NT; Technician: Michelene Heady, NT; Technician: Mal Misty, NT  Subjective:  Feels good Pain minimal - PO meds work Comfortable w ostomy & dressing changes +flatus in bag Walking well   Objective:  Vital signs:  Temp:  [98 F (36.7 C)-98.7 F (37.1 C)] 98.4 F (36.9 C) (02/09 0532) Pulse Rate:  [61-63] 63  (02/09 0532) Resp:  [16-18] 18  (02/09 0532) BP: (121-142)/(75-94) 132/86 mmHg (02/09 0532) SpO2:  [96 %-97 %] 96 % (02/09 0532) Last BM Date: 01/11/12  Intake/Output   Yesterday:  02/08 0701 - 02/09 0700 In: 8295 [P.O.:1182; I.V.:2460] Out: 1750 [Urine:1750] This shift:  Total I/O In: 360 [P.O.:360] Out: -   Bowel function:  Flatus: yes  BM: yes  Physical Exam:  General: Pt awake/alert/oriented x4 in no acute distress Eyes: PERRL, normal EOM.  Sclera clear.  No icterus Neuro: CN II-XII intact w/o focal sensory/motor deficits. Lymph: No head/neck/groin lymphadenopathy Psych:  No delerium/psychosis/paranoia.  Calm, smiling HENT: Normocephalic, Mucus membranes moist.  No thrush Neck: Supple, No tracheal deviation Chest: Clear.  No chest wall pain w good excursion CV:  Pulses intact.  Regular rhythm Abdomen: Soft, Nontender/Nondistended.  No incarcerated hernias.  Wound clean.  Ostomy ++gas, some stool Ext:  SCDs BLE.  No mjr edema.  No cyanosis Skin: No petechiae / purpurae  Results:   Labs: No results found for this or any previous visit (from the past 48 hour(s)).  Imaging / Studies: No results found.  Medications / Allergies: per  chart  Antibiotics: Anti-infectives     Start     Dose/Rate Route Frequency Ordered Stop   01/06/12 2100   ertapenem (INVANZ) 1 g in sodium chloride 0.9 % 50 mL IVPB  Status:  Discontinued        1 g 100 mL/hr over 30 Minutes Intravenous Every 24 hours 01/05/12 2105 01/06/12 1526   01/06/12 2000   ertapenem (INVANZ) 1 g in sodium chloride 0.9 % 50 mL IVPB        1 g 100 mL/hr over 30 Minutes Intravenous Every 24 hours 01/06/12 1526     01/05/12 2030   ertapenem (INVANZ) 1 g in sodium chloride 0.9 % 50 mL IVPB        1 g 100 mL/hr over 30 Minutes Intravenous  Once 01/05/12 2024 01/05/12 2119          Assessment  Douglas Wiggins  46 y.o. male  5 Days Post-Op  Procedure(s): COLON RESECTION SIGMOID  Problem List:  Principal Problem:  *Diverticulitis of colon with perforation Active Problems:  Hypertension  Hypothyroid  Obesity (BMI 30-39.9)   Imrpoved  Plan: -If tolerates another solid meal at lunch, OK to D/c home to parent whom re helping take care of him 1-2 weeks -instructions discussed -HH setup -RTC 1-2 weesk for close f/u  Douglas Wiggins, M.D., F.A.C.S. Gastrointestinal and Minimally Invasive Surgery [facility] [address] [place], [place] [address] [phone] Main / Paging [phone] Voice Mail   01/11/2012

## 2012-01-11 NOTE — Progress Notes (Signed)
CARE MANAGEMENT NOTE 01/11/2012  Patient:  Douglas Wiggins, Douglas Wiggins   Account Number:  [id number]  Date Initiated:  01/09/2012  Documentation initiated by:  CRAFT,TERRI  Subjective/Objective Assessment:   46 yo male admitted 01/05/12 with abdominal pain, peforated diverticulitis     Action/Plan:   D/C when medically stable   Anticipated DC Date:  01/12/2012   Anticipated DC Plan:  HOME W HOME HEALTH SERVICES      DC Planning Services  CM consult      [facility] Choice  HOME HEALTH   Choice offered to / List presented to:  C-1 Patient   DME arranged  NA      DME agency  NA     HH arranged  HH-1 RN      [facility] agency  [facility]   Status of service:  In process, will continue to follow Medicare Important Message given?   (If response is "NO", the following Medicare IM given date fields will be blank) Date Medicare IM given:   Date Additional Medicare IM given:    Discharge Disposition:    Per UR Regulation:    Comments:  01/11/12 1214 Cherelle Midkiff,Rn,BSN [phone] Cm spoke with pt concerning d/c planning. Pt confirmed [facility] for home health choice. RN  PATTI notified of need for [facility] order from MD for [facility] for ostomy care.    01/09/12, Kathi Der RNC-MNN, BSN, [phone], CM received referral.  Cm met with pt to offer choice for [facility] services. Pt has chosen [facility] for [facility] services.  Darl Pikes at [facility] contacted and confirmation of services received.  Pt states that his parents will be able to assist him upon d/c

## 2012-01-11 NOTE — Progress Notes (Signed)
The patient is receiving Protonix by the intravenous route.  Based on criteria approved by the Pharmacy and Therapeutics Committee and the Medical Executive Committee, the medication is being converted to the equivalent oral dose form.  These criteria include: -No Active GI bleeding -Able to tolerate diet of full liquids (or better) or tube feeding OR able to tolerate other medications by the oral or enteral route  If you have any questions about this conversion, please contact the Pharmacy Department (ext [phone]).  Thank you.  Hessie Knows, PharmD, BCPS 01/11/2012 7:33 AM pager [phone]

## 2012-01-15 ENCOUNTER — Telehealth (INDEPENDENT_AMBULATORY_CARE_PROVIDER_SITE_OTHER): Payer: Self-pay

## 2012-01-15 NOTE — Telephone Encounter (Signed)
That can't really be true, since the home health nurse was not in the hospital with the patient.  He had stool output prior to discharge.  He should take stool softeners and miralax until he is having good bowel movements into his ostomy bag.  If he is having increasing abdominal distention and pain, he should be seen in Urgent Office or in the ED at [facility], since I am gone for the next 5 days.  [name]. [name], MD, [facility]  01/15/2012 2:10 PM

## 2012-01-15 NOTE — Telephone Encounter (Signed)
Cletis Media from [facility] the message that Dr. [name] had recommended stool softeners with Miralax.  If any problems with pain or distention they should call our office. She will make the patient's family aware.

## 2012-01-15 NOTE — Telephone Encounter (Signed)
The pharmacy called and stated the patient was picking up the pain medicine and requested saline for wound care.  They need a rx for that.  I confirmed in his discharge instructions that he was to do dressing changes and gave the authorization for 1 bottle of NS for wound care.  Use as directed and refill prn.

## 2012-01-15 NOTE — Telephone Encounter (Signed)
Pt's mother called requesting pain medication refill after surgery.  Norco 5/325mg  #30 was called in to [facility] at [phone].  Pt is aware.

## 2012-01-15 NOTE — Telephone Encounter (Signed)
Inetta Fermo, a nurse from [facility], called this morning to report that the patient has had no more than 1 tablespoon of stool output in his ostomy since his surgery.  Otherwise, the patient is doing well.  Norco 5/325 #30 was called in to his pharmacy this morning at his mother's request.

## 2012-01-16 ENCOUNTER — Ambulatory Visit (INDEPENDENT_AMBULATORY_CARE_PROVIDER_SITE_OTHER): Payer: BC Managed Care – PPO | Admitting: General Surgery

## 2012-01-16 ENCOUNTER — Telehealth (INDEPENDENT_AMBULATORY_CARE_PROVIDER_SITE_OTHER): Payer: Self-pay

## 2012-01-16 ENCOUNTER — Encounter (INDEPENDENT_AMBULATORY_CARE_PROVIDER_SITE_OTHER): Payer: Self-pay | Admitting: General Surgery

## 2012-01-16 VITALS — BP 114/76 | HR 83 | Temp 97.6°F | Ht 76.0 in | Wt 254.6 lb

## 2012-01-16 DIAGNOSIS — T8140XA Infection following a procedure, unspecified, initial encounter: Secondary | ICD-10-CM

## 2012-01-16 DIAGNOSIS — T8149XA Infection following a procedure, other surgical site, initial encounter: Secondary | ICD-10-CM

## 2012-01-16 NOTE — Progress Notes (Signed)
HPI The patient comes in with an infected abdominal wound status post colectomy and colostomy for perforated diverticulitis. The wound and the family today was that he developed a large in the subcutaneous tissue in the inferior aspect of his wound. We'll likely happen is that some of the deeper subcutaneous fat necrosis leaving a space with necrotic debris inside.  He's had no fevers chills. He is still taking his antibiotics which can be discontinued when he runs out.  PE On examination he has about a 2 cm hole in the inferior aspect of the subcutaneous tissue which is already open. Using scissors and a forceps I was able to debride some necrotic subcutaneous fat. This is down to what appeared to be an intact fascia. There did not appear to be a hole in the fascia or any drainage of peritoneal fluid.  After removing the necrotic debris I did pack this area with one single 4 x 4 gauze opened up and with the saline. Wet-to-dry dressing was subsequently done. The patient's family member was available to observe the dressing will be able to do so at home.  Studiy review There are no current studies to review.  Assessment Subcutaneous necrotic wound with intact fascia.  Plan Continue the wet to dry dressings with 4 x 4 gauzes and saline. Keep his regularly scheduled appointment to see Dr. [name] in 12 days.

## 2012-01-16 NOTE — Telephone Encounter (Signed)
10:33am - Patients mother called stating upper abdominal wound is deeper and wider. Staples are still in placed. Patient will be seen in urgent office today by Dr. Lindie Spruce. Surgery was on 01/06/2012 - Exploratory laparotomy with sigmoid colectomy and descending colostomy (Hartman's procedure).  Yesterday the above incision was the size of a pinhole now she can get a Q-tip in and it goes in deeper. Patient advised to keep the area clean and covered.

## 2012-01-17 ENCOUNTER — Encounter (INDEPENDENT_AMBULATORY_CARE_PROVIDER_SITE_OTHER): Payer: Self-pay | Admitting: Surgery

## 2012-01-17 NOTE — Discharge Summary (Signed)
Physician Discharge Summary  Patient ID: Douglas Wiggins MRN: [id number] DOB/AGE: 1966-01-20 46 y.o.  Admit date: 01/05/2012 Discharge date: 01/17/2012  Admission Diagnoses: Perforated Sigmoid Diverticultis  Discharge Diagnoses: Same Principal Problem:  *Diverticulitis of colon with perforation Active Problems:  Hypertension  Hypothyroid  Obesity (BMI 30-39.9)   PROCEDURES: Exploratory laparotomy with sigmoid colectomy and descending colostomy; Hartmann's procedure. 01/06/12 Dr. Candice Camp Course: Patient is a 46 year old gentleman presented with 2 weeks or abdominal pain he finally presented the emergency room for evaluation was found to have perforated diverticulitis. He was admitted by Dr. Avel Peace in the evening of 01/05/12, made n.p.o. placed on IV antibiotics and IV hydration. Following a.m. his white count was up his abdomen was tender and painful on palpation, he was seen by Dr. Manus Rudd, and taken to the operating room that afternoon. He underwent the noted procedure, and tolerated well. He was transferred to the floor. He had a postoperative ileus which slowly resolved. He was mobilize given colostomy care instruction. It was discharged home on 01/11/2012 by Dr. Viviann Spare gross. He was scheduled for followup by Dr. [name] in 2 weeks. Condition on discharge: Improving  Disposition: Home-Health Care Svc  Discharge Orders    Future Appointments: Provider: Department: Dept Phone: Center:   01/28/2012 3:50 PM [name]. Tsuei, MD [facility] [phone] None     Future Orders Please Complete By Expires   Diet - low sodium heart healthy      Increase activity slowly        Medication List  As of 01/17/2012  4:04 PM   TAKE these medications         levothyroxine 75 MCG tablet   Commonly known as: SYNTHROID, LEVOTHROID   Take 75 mcg by mouth daily.      lisinopril 20 MG tablet   Commonly known as: PRINIVIL,ZESTRIL   Take 20 mg by mouth daily.     meloxicam 7.5 MG tablet   Commonly known as: MOBIC   Take 7.5 mg by mouth daily.      oxyCODONE 5 MG immediate release tablet   Commonly known as: Oxy IR/ROXICODONE   Take 1-2 tablets (5-10 mg total) by mouth every 4 (four) hours as needed.           Follow-up Information    Follow up with Wynona Luna., MD. Schedule an appointment as soon as possible for a visit in 2 weeks. (Wet to dry dressing changes to open abdominal wound twice a day.)    Contact information:   [employer] [address] [place] [address] [phone]          Signed: Sherrie George 01/17/2012, 4:04 PM

## 2012-01-20 NOTE — Discharge Summary (Signed)
Agree  [name]. [name], MD, [facility]  01/20/2012 10:53 AM

## 2012-01-28 ENCOUNTER — Ambulatory Visit (INDEPENDENT_AMBULATORY_CARE_PROVIDER_SITE_OTHER): Payer: BC Managed Care – PPO | Admitting: Surgery

## 2012-01-28 ENCOUNTER — Encounter (INDEPENDENT_AMBULATORY_CARE_PROVIDER_SITE_OTHER): Payer: Self-pay | Admitting: Surgery

## 2012-01-28 VITALS — BP 130/82 | HR 68 | Temp 97.9°F | Resp 16 | Ht 76.0 in | Wt 258.6 lb

## 2012-01-28 DIAGNOSIS — K5732 Diverticulitis of large intestine without perforation or abscess without bleeding: Secondary | ICD-10-CM

## 2012-01-28 DIAGNOSIS — K5720 Diverticulitis of large intestine with perforation and abscess without bleeding: Secondary | ICD-10-CM

## 2012-01-28 NOTE — Progress Notes (Signed)
S/p emergent exploratory laparotomy/ sigmoid colectomy/ Hartmann's procedure on 01/06/12 for perforated sigmoid diverticulitis.  His wound was treated with a VAC dressing, and this was changed to a wet-to-dry dressing at the time of discharge.  The patient is regaining his level of energy and his appetite.  He is managing the colostomy well.  His wound is clean and granulating well, but he has two deep tunnels in the lower part of the wound.  These have minimal drainage and seem to be granulating well.    Filed Vitals:   01/28/12 1541  BP: 130/82  Pulse: 68  Temp: 97.9 F (36.6 C)  Resp: 16   Abdomen - soft; non-tender;  The remaining staples are removed.  The upper part of the open wound is completely granulated and is beginning to contract.  The lower part of the open wound is about 3 x 7 cm with two deeper tunnels.  All of this wound seems to be covered in clean granulation tissue with no surrounding erythema, purulence, or foul smelling drainage.  Imp:  Doing well three weeks s/p Hartmann's procedure.  The wound was left open at the time of surgery, and therefore does not represent a wound infection.  Plan:  Continue dressing changes.  We will wait 3-6 months before reversing his colostomy.  Follow-up 2-3 weeks.  [name]. [name], MD, [facility]  01/28/2012 5:26 PM

## 2012-01-30 ENCOUNTER — Encounter (HOSPITAL_COMMUNITY): Payer: Self-pay | Admitting: Surgery

## 2012-01-31 ENCOUNTER — Telehealth (INDEPENDENT_AMBULATORY_CARE_PROVIDER_SITE_OTHER): Payer: Self-pay

## 2012-01-31 NOTE — Telephone Encounter (Signed)
The patient called and said he would like a prescription for ostomy bags and wafers sent to his pharmacy.  He can file it with the insurance.  He uses [facility] [address] [place], [place] [address] and their phone # is [phone].  I can mail it to attention Douglas Wiggins.

## 2012-02-03 NOTE — Telephone Encounter (Signed)
I called the pt and notified him that Dr [name] does not write prescriptions for ostomy supplies.  He advises the home health nurse should handle this and she can fax a request for Dr [name] to sign.  I called the pt and informed him to call his home health nurse 1st and see if she can help.  She can fax the info to [place].

## 2012-02-04 ENCOUNTER — Encounter (INDEPENDENT_AMBULATORY_CARE_PROVIDER_SITE_OTHER): Payer: BC Managed Care – PPO | Admitting: Surgery

## 2012-02-04 ENCOUNTER — Telehealth (INDEPENDENT_AMBULATORY_CARE_PROVIDER_SITE_OTHER): Payer: Self-pay

## 2012-02-04 NOTE — Telephone Encounter (Signed)
Pt called stating symptoms with ostomy have resolved and pt is cancelling todays appt. Pt will call if symptoms recur and keep f/u  appt on 3-12.

## 2012-02-11 ENCOUNTER — Ambulatory Visit (INDEPENDENT_AMBULATORY_CARE_PROVIDER_SITE_OTHER): Payer: BC Managed Care – PPO | Admitting: Surgery

## 2012-02-11 ENCOUNTER — Encounter (INDEPENDENT_AMBULATORY_CARE_PROVIDER_SITE_OTHER): Payer: Self-pay | Admitting: Surgery

## 2012-02-11 VITALS — BP 142/96 | HR 68 | Temp 97.1°F | Resp 18 | Ht 76.0 in | Wt 256.4 lb

## 2012-02-11 DIAGNOSIS — K5720 Diverticulitis of large intestine with perforation and abscess without bleeding: Secondary | ICD-10-CM

## 2012-02-11 DIAGNOSIS — K5732 Diverticulitis of large intestine without perforation or abscess without bleeding: Secondary | ICD-10-CM

## 2012-02-11 MED ORDER — HYDROCODONE-ACETAMINOPHEN 5-500 MG PO TABS: 1.0000 | ORAL_TABLET | ORAL | Status: AC | PRN

## 2012-02-11 NOTE — Progress Notes (Signed)
Status post Douglas Wiggins procedure for perforated diverticulitis on 01/06/12. He is doing much better. His appetite and energy level are improving. His wound is closing nicely. It is much shallower and is easier to pack. The upper part of the wound is completely closed. The lower part measures about 2 x 5 cm. All of the wounds covered and clean granulation tissue. I dressed the wound with a single 4 x 4 gauze. His colostomy continues to function well. 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 We will reevaluate the patient in 3 weeks. He may return to work once the wound is closed.  [name]. [name], MD, [facility]  02/11/2012 5:02 PM

## 2012-02-12 ENCOUNTER — Encounter (INDEPENDENT_AMBULATORY_CARE_PROVIDER_SITE_OTHER): Payer: Self-pay | Admitting: Surgery

## 2012-02-27 ENCOUNTER — Encounter (INDEPENDENT_AMBULATORY_CARE_PROVIDER_SITE_OTHER): Payer: Self-pay | Admitting: General Surgery

## 2012-02-27 ENCOUNTER — Telehealth (INDEPENDENT_AMBULATORY_CARE_PROVIDER_SITE_OTHER): Payer: Self-pay | Admitting: Surgery

## 2012-03-12 ENCOUNTER — Encounter (INDEPENDENT_AMBULATORY_CARE_PROVIDER_SITE_OTHER): Payer: Self-pay | Admitting: Surgery

## 2012-03-12 ENCOUNTER — Ambulatory Visit (INDEPENDENT_AMBULATORY_CARE_PROVIDER_SITE_OTHER): Payer: BC Managed Care – PPO | Admitting: Surgery

## 2012-03-12 VITALS — BP 121/81 | HR 86 | Temp 97.6°F | Resp 18 | Ht 76.0 in | Wt 259.4 lb

## 2012-03-12 DIAGNOSIS — K5732 Diverticulitis of large intestine without perforation or abscess without bleeding: Secondary | ICD-10-CM

## 2012-03-12 DIAGNOSIS — K5720 Diverticulitis of large intestine with perforation and abscess without bleeding: Secondary | ICD-10-CM

## 2012-03-12 MED ORDER — HYDROCODONE-ACETAMINOPHEN 5-325 MG PO TABS: 1.0000 | ORAL_TABLET | ORAL | Status: DC | PRN

## 2012-03-12 NOTE — Progress Notes (Signed)
Patient ID: Douglas Wiggins, male   DOB: 16-Mar-1966, 46 y.o.   MRN: [id number]  Chief Complaint  Patient presents with  . Follow-up    HPI Douglas Wiggins is a 46 y.o. male.   HPI He is s/p emergent Hartmann's resection on 01/07/12 for perforated sigmoid diverticulitis.  He is doing well after his colostomy.  His incision is completely healed.  He has developed a parastomal hernia and has some discomfort associated with this.  He is eager to have his colostomy reversed. Past Medical History  Diagnosis Date  . Hypertension   . Thyroid disease   . Hyperthyroidism   . Sleep apnea     does not wear cpap  . Depression   . Diverticulitis     Past Surgical History  Procedure Date  . Knee surgery   . Shoulder surgery   . Colon surgery 01/2012  . Colostomy revision 01/06/2012    Procedure: COLON RESECTION SIGMOID;  Surgeon: [name]. [name], MD;  Location: [facility];  Service: General;  Laterality: N/A;  with hartmans procedure    No family history on file.  Social History History  Substance Use Topics  . Smoking status: Never Smoker   . Smokeless tobacco: Never Used  . Alcohol Use: No    Allergies  Allergen Reactions  . Sulfa Antibiotics Hives and Rash    Current Outpatient Prescriptions  Medication Sig Dispense Refill  . amoxicillin (AMOXIL) 500 MG capsule       . HYDROcodone-acetaminophen (NORCO) 5-325 MG per tablet Take 1 tablet by mouth every 4 (four) hours as needed for pain.  40 tablet  0  . levothyroxine (SYNTHROID, LEVOTHROID) 75 MCG tablet Take 75 mcg by mouth daily.      Marland Kitchen lisinopril (PRINIVIL,ZESTRIL) 20 MG tablet Take 20 mg by mouth daily.      Marland Kitchen losartan (COZAAR) 100 MG tablet Twice daily.      . meloxicam (MOBIC) 7.5 MG tablet Take 7.5 mg by mouth daily.      Sherren Mocha Supplies (NEW IMAGE DRAINABLE POUCH) MISC by Does not apply route.      . quinapril (ACCUPRIL) 20 MG tablet Daily.      . sodium chloride irrigation 0.9 % irrigation Twice daily.        Review of  Systems Review of Systems  Constitutional: Negative for fever, chills and unexpected weight change.  HENT: Negative for hearing loss, congestion, sore throat, trouble swallowing and voice change.   Eyes: Negative for visual disturbance.  Respiratory: Negative for cough and wheezing.   Cardiovascular: Negative for chest pain, palpitations and leg swelling.  Gastrointestinal: Positive for abdominal pain. Negative for nausea, vomiting, diarrhea, constipation, blood in stool, abdominal distention, anal bleeding and rectal pain.  Genitourinary: Negative for hematuria and difficulty urinating.  Musculoskeletal: Negative for arthralgias.  Skin: Negative for rash and wound.  Neurological: Negative for seizures, syncope, weakness and headaches.  Hematological: Negative for adenopathy. Does not bruise/bleed easily.  Psychiatric/Behavioral: Negative for confusion.    Blood pressure 121/81, pulse 86, temperature 97.6 F (36.4 C), temperature source Temporal, resp. rate 18, height 6\' 4"  (1.93 m), weight 259 lb 6.4 oz (117.663 kg).  Physical Exam Physical Exam WDWN in NAD HEENT:  EOMI, sclera anicteric Neck:  No masses, no thyromegaly Lungs:  CTA bilaterally; normal respiratory effort CV:  Regular rate and rhythm; no murmurs Abd:  +bowel sounds, soft, healed midline scar;  Moderate-sized left parastomal hernia; no prolapse of the colostomy Ext:  Well-perfused; no edema Skin:  Warm, dry; no sign of jaundice  Data Reviewed none  Assessment    Sigmoid diverticulitis s/p Hartmann's resection Parastomal hernia    Plan    Contrast study via rectal stump and colostomy If normal, will plan colostomy reversal in May, which will be 3 month post-op.  The surgical procedure has been discussed with the patient.  Potential risks, benefits, alternative treatments, and expected outcomes have been explained.  All of the patient's questions at this time have been answered.  The likelihood of reaching the  patient's treatment goal is good.  The patient understand the proposed surgical procedure and wishes to proceed. We will use a one-day bowel prep.       Kirsi Hugh K. 03/12/2012, 9:20 AM

## 2012-03-12 NOTE — Patient Instructions (Signed)
We will obtain x-ray studies of your rectum and your colostomy in the next week or two.  We will plan your surgery for some time in May.

## 2012-03-16 ENCOUNTER — Other Ambulatory Visit (INDEPENDENT_AMBULATORY_CARE_PROVIDER_SITE_OTHER): Payer: Self-pay | Admitting: Surgery

## 2012-03-16 ENCOUNTER — Ambulatory Visit (HOSPITAL_COMMUNITY)
Admission: RE | Admit: 2012-03-16 | Discharge: 2012-03-16 | Disposition: A | Discharge status: Home / Self Care | Payer: BC Managed Care – PPO | Source: Ambulatory Visit | Attending: Surgery | Admitting: Surgery

## 2012-03-16 DIAGNOSIS — K5732 Diverticulitis of large intestine without perforation or abscess without bleeding: Secondary | ICD-10-CM | POA: Insufficient documentation

## 2012-03-16 DIAGNOSIS — K5720 Diverticulitis of large intestine with perforation and abscess without bleeding: Secondary | ICD-10-CM

## 2012-03-16 DIAGNOSIS — Z433 Encounter for attention to colostomy: Secondary | ICD-10-CM | POA: Insufficient documentation

## 2012-04-06 ENCOUNTER — Ambulatory Visit (HOSPITAL_COMMUNITY)
Admission: RE | Admit: 2012-04-06 | Discharge: 2012-04-06 | Disposition: A | Discharge status: Home / Self Care | Payer: BC Managed Care – PPO | Source: Ambulatory Visit | Attending: Surgery | Admitting: Surgery

## 2012-04-06 ENCOUNTER — Encounter (HOSPITAL_COMMUNITY): Payer: Self-pay | Admitting: Pharmacy Technician

## 2012-04-06 ENCOUNTER — Encounter (HOSPITAL_COMMUNITY)
Admission: RE | Admit: 2012-04-06 | Discharge: 2012-04-06 | Disposition: A | Discharge status: Home / Self Care | Payer: BC Managed Care – PPO | Source: Ambulatory Visit | Attending: Surgery | Admitting: Surgery

## 2012-04-06 ENCOUNTER — Encounter (HOSPITAL_COMMUNITY): Payer: Self-pay

## 2012-04-06 DIAGNOSIS — Z01818 Encounter for other preprocedural examination: Secondary | ICD-10-CM | POA: Insufficient documentation

## 2012-04-06 DIAGNOSIS — Z933 Colostomy status: Secondary | ICD-10-CM | POA: Insufficient documentation

## 2012-04-06 DIAGNOSIS — Z01812 Encounter for preprocedural laboratory examination: Secondary | ICD-10-CM | POA: Insufficient documentation

## 2012-04-06 DIAGNOSIS — I10 Essential (primary) hypertension: Secondary | ICD-10-CM | POA: Insufficient documentation

## 2012-04-06 HISTORY — DX: Gastro-esophageal reflux disease without esophagitis: K21.9

## 2012-04-06 LAB — CBC
HCT: 43.6 % (ref 39.0–52.0)
MCH: 29.4 pg (ref 26.0–34.0)
MCV: 83.2 fL (ref 78.0–100.0)
RDW: 13 % (ref 11.5–15.5)
WBC: 6.1 10*3/uL (ref 4.0–10.5)

## 2012-04-06 LAB — BASIC METABOLIC PANEL
BUN: 16 mg/dL (ref 6–23)
CO2: 23 mEq/L (ref 19–32)
Chloride: 100 mEq/L (ref 96–112)
Creatinine, Ser: 1.2 mg/dL (ref 0.50–1.35)

## 2012-04-06 NOTE — Pre-Procedure Instructions (Signed)
Instructed to call Dr Emeline Darling nurse today regarding if bowel prep is needed and to follow up until receives clarification from surgeon

## 2012-04-06 NOTE — Patient Instructions (Signed)
22 Douglas Wiggins  04/06/2012   Your procedure is scheduled on:  04/13/12  Monday surgery 0900-1100  Report to [facility] at  0630     AM.  Call this number if you have problems the morning of surgery: [phone]     Or PST   [phone]  Deliah Goody                 CALL DR TSUEI OFFICE NURSE TODAY REGARDING BOWEL PREP  Remember:             STOP ASPIRIN,ANTIINFLAMMATORIES or HERBALS 5 DAYS BEFORE SURGERY  Do not eat food :After Midnight.   Sunday NIGHT  BEFORE SURGERY  Or as directed by office  INCREASE FLUIDS Sunday- DRINK UNTIL MIDNIGHT OR BEDTIME  Clear liquids include soda, tea, black coffee, apple or grape juice, broth.  Take these medicines the morning of surgery with A SIP OF WATER: VICODIN IF NEEDED   Do not wear jewelry, make-up or nail polish.  Do not wear lotions, powders, or perfumes. You may wear deodorant.  Do not shave 48 hours prior to surgery.  Do not bring valuables to the hospital.  Contacts, dentures or bridgework may not be worn into surgery.  Leave suitcase in the car. After surgery it may be brought to your room.  For patients admitted to the hospital, checkout time is 11:00 AM the day of discharge.   Patients discharged the day of surgery will not be allowed to drive home.  Name and phone number of your driver:   PARENTS                                                                   Special Instructions: CHG Shower Use Special Wash: 1/2 bottle night before surgery and 1/2 bottle morning of surgery. REGULAR SOAP FACE AND PRIVATES                             MEN-MAY SHAVE FACE MORNING OF SURGERY  Please read over the following fact sheets that you were given: MRSA Information

## 2012-04-08 ENCOUNTER — Telehealth (INDEPENDENT_AMBULATORY_CARE_PROVIDER_SITE_OTHER): Payer: Self-pay | Admitting: General Surgery

## 2012-04-08 NOTE — Telephone Encounter (Signed)
Mom calling with question about bowel prep for colostomy takedown.  Surgery scheduled for Monday and had pre-op appt yesterday.  No bowel prep was mentioned and neither Mom or patient is aware of one.  Please advise.

## 2012-04-13 ENCOUNTER — Ambulatory Visit (HOSPITAL_COMMUNITY): Payer: BC Managed Care – PPO | Admitting: Anesthesiology

## 2012-04-13 ENCOUNTER — Encounter (HOSPITAL_COMMUNITY): Payer: Self-pay | Admitting: *Deleted

## 2012-04-13 ENCOUNTER — Encounter (HOSPITAL_COMMUNITY)
Admission: RE | Disposition: A | Discharge status: Home / Self Care | Payer: Self-pay | Source: Ambulatory Visit | Attending: Surgery

## 2012-04-13 ENCOUNTER — Encounter (HOSPITAL_COMMUNITY): Payer: Self-pay | Admitting: Anesthesiology

## 2012-04-13 ENCOUNTER — Inpatient Hospital Stay (HOSPITAL_COMMUNITY)
Admission: RE | Admit: 2012-04-13 | Discharge: 2012-04-17 | DRG: 152 | Disposition: A | Discharge status: Home / Self Care | Payer: BC Managed Care – PPO | Source: Ambulatory Visit | Attending: Surgery | Admitting: Surgery

## 2012-04-13 DIAGNOSIS — Z433 Encounter for attention to colostomy: Principal | ICD-10-CM

## 2012-04-13 DIAGNOSIS — F329 Major depressive disorder, single episode, unspecified: Secondary | ICD-10-CM | POA: Diagnosis present

## 2012-04-13 DIAGNOSIS — K5720 Diverticulitis of large intestine with perforation and abscess without bleeding: Secondary | ICD-10-CM

## 2012-04-13 DIAGNOSIS — IMO0002 Reserved for concepts with insufficient information to code with codable children: Secondary | ICD-10-CM | POA: Diagnosis present

## 2012-04-13 DIAGNOSIS — I10 Essential (primary) hypertension: Secondary | ICD-10-CM | POA: Diagnosis present

## 2012-04-13 DIAGNOSIS — E079 Disorder of thyroid, unspecified: Secondary | ICD-10-CM | POA: Diagnosis present

## 2012-04-13 DIAGNOSIS — K560 Paralytic ileus: Secondary | ICD-10-CM | POA: Diagnosis not present

## 2012-04-13 DIAGNOSIS — Z8719 Personal history of other diseases of the digestive system: Secondary | ICD-10-CM

## 2012-04-13 DIAGNOSIS — G4730 Sleep apnea, unspecified: Secondary | ICD-10-CM | POA: Diagnosis present

## 2012-04-13 DIAGNOSIS — F3289 Other specified depressive episodes: Secondary | ICD-10-CM | POA: Diagnosis present

## 2012-04-13 HISTORY — PX: COLOSTOMY CLOSURE: SHX1381

## 2012-04-13 LAB — CBC
HCT: 45.4 % (ref 39.0–52.0)
HCT: 45.5 % (ref 39.0–52.0)
Hemoglobin: 16.5 g/dL (ref 13.0–17.0)
MCH: 29.8 pg (ref 26.0–34.0)
MCH: 30.2 pg (ref 26.0–34.0)
MCHC: 36.3 g/dL — ABNORMAL HIGH (ref 30.0–36.0)
MCV: 83.3 fL (ref 78.0–100.0)
MCV: 83.9 fL (ref 78.0–100.0)
Platelets: 197 10*3/uL (ref 150–400)
RBC: 5.46 MIL/uL (ref 4.22–5.81)
RDW: 13.3 % (ref 11.5–15.5)
WBC: 14.2 10*3/uL — ABNORMAL HIGH (ref 4.0–10.5)

## 2012-04-13 SURGERY — COLOSTOMY CLOSURE
Anesthesia: General | Wound class: Clean Contaminated

## 2012-04-13 MED ORDER — SODIUM CHLORIDE 0.9 % IV SOLN
1.0000 g | INTRAVENOUS | Status: AC
  Administered 2012-04-13: 1 g via INTRAVENOUS

## 2012-04-13 MED ORDER — ONDANSETRON HCL 4 MG/2ML IJ SOLN
INTRAMUSCULAR | Status: DC | PRN
  Administered 2012-04-13: 4 mg via INTRAVENOUS

## 2012-04-13 MED ORDER — ONDANSETRON HCL 4 MG/2ML IJ SOLN
4.0000 mg | Freq: Four times a day (QID) | INTRAMUSCULAR | Status: DC | PRN
  Filled 2012-04-13 (×2): qty 2

## 2012-04-13 MED ORDER — SUFENTANIL CITRATE 50 MCG/ML IV SOLN
INTRAVENOUS | Status: DC | PRN
  Administered 2012-04-13: 10 ug via INTRAVENOUS
  Administered 2012-04-13 (×2): 15 ug via INTRAVENOUS
  Administered 2012-04-13: 10 ug via INTRAVENOUS

## 2012-04-13 MED ORDER — DIPHENHYDRAMINE HCL 50 MG/ML IJ SOLN
12.5000 mg | Freq: Four times a day (QID) | INTRAMUSCULAR | Status: DC | PRN
  Administered 2012-04-14 – 2012-04-15 (×5): 12.5 mg via INTRAVENOUS
  Filled 2012-04-13 (×5): qty 1

## 2012-04-13 MED ORDER — SODIUM CHLORIDE 0.9 % IV SOLN
INTRAVENOUS | Status: AC
  Filled 2012-04-13: qty 1

## 2012-04-13 MED ORDER — HYDROMORPHONE 0.3 MG/ML IV SOLN
INTRAVENOUS | Status: AC
  Filled 2012-04-13: qty 25

## 2012-04-13 MED ORDER — ONDANSETRON HCL 4 MG/2ML IJ SOLN
4.0000 mg | Freq: Four times a day (QID) | INTRAMUSCULAR | Status: DC | PRN
  Administered 2012-04-14 – 2012-04-15 (×3): 4 mg via INTRAVENOUS
  Filled 2012-04-13 (×2): qty 2

## 2012-04-13 MED ORDER — KCL IN DEXTROSE-NACL 20-5-0.45 MEQ/L-%-% IV SOLN
INTRAVENOUS | Status: DC
  Administered 2012-04-13 – 2012-04-16 (×8): via INTRAVENOUS
  Filled 2012-04-13 (×13): qty 1000

## 2012-04-13 MED ORDER — ENOXAPARIN SODIUM 40 MG/0.4ML ~~LOC~~ SOLN
40.0000 mg | SUBCUTANEOUS | Status: DC
  Administered 2012-04-14: 40 mg via SUBCUTANEOUS
  Filled 2012-04-13 (×3): qty 0.4

## 2012-04-13 MED ORDER — MIDAZOLAM HCL 5 MG/5ML IJ SOLN
INTRAMUSCULAR | Status: DC | PRN
  Administered 2012-04-13: 2 mg via INTRAVENOUS

## 2012-04-13 MED ORDER — PROMETHAZINE HCL 25 MG/ML IJ SOLN
12.5000 mg | INTRAMUSCULAR | Status: DC | PRN
  Administered 2012-04-13: 12.5 mg via INTRAVENOUS

## 2012-04-13 MED ORDER — SODIUM CHLORIDE 0.9 % IV SOLN
1.0000 g | INTRAVENOUS | Status: AC
  Administered 2012-04-13: 1 g via INTRAVENOUS
  Filled 2012-04-13 (×2): qty 1

## 2012-04-13 MED ORDER — DIPHENHYDRAMINE HCL 50 MG/ML IJ SOLN
25.0000 mg | Freq: Once | INTRAMUSCULAR | Status: AC
  Administered 2012-04-13: 25 mg via INTRAVENOUS

## 2012-04-13 MED ORDER — 0.9 % SODIUM CHLORIDE (POUR BTL) OPTIME
TOPICAL | Status: DC | PRN
  Administered 2012-04-13: 2000 mL

## 2012-04-13 MED ORDER — NALOXONE HCL 0.4 MG/ML IJ SOLN: 0.4000 mg | INTRAMUSCULAR | Status: DC | PRN

## 2012-04-13 MED ORDER — LEVOTHYROXINE SODIUM 75 MCG PO TABS
75.0000 ug | ORAL_TABLET | Freq: Every day | ORAL | Status: DC
  Administered 2012-04-14 – 2012-04-16 (×3): 75 ug via ORAL
  Filled 2012-04-13 (×5): qty 1

## 2012-04-13 MED ORDER — HYDROMORPHONE 0.3 MG/ML IV SOLN
INTRAVENOUS | Status: DC
  Administered 2012-04-13: 2.7 mg via INTRAVENOUS
  Administered 2012-04-13: 6 mL via INTRAVENOUS
  Administered 2012-04-13: 0.3 mg via INTRAVENOUS
  Administered 2012-04-13: 2.7 mg via INTRAVENOUS
  Administered 2012-04-14: 1.2 mg via INTRAVENOUS
  Administered 2012-04-14: 01:00:00 via INTRAVENOUS
  Administered 2012-04-14: 2.1 mg via INTRAVENOUS
  Administered 2012-04-14: 16:00:00 via INTRAVENOUS
  Administered 2012-04-14: 1.2 mg via INTRAVENOUS
  Administered 2012-04-14: 1.7 mg via INTRAVENOUS
  Administered 2012-04-14: 2.1 mg via INTRAVENOUS
  Administered 2012-04-15: 0.9 mg via INTRAVENOUS
  Administered 2012-04-15: 3 mg via INTRAVENOUS
  Administered 2012-04-15: 0.3 mg via INTRAVENOUS
  Administered 2012-04-15: 0.6 mg via INTRAVENOUS
  Administered 2012-04-15: 0.9 mg via INTRAVENOUS
  Administered 2012-04-15: 0.3 mg via INTRAVENOUS
  Administered 2012-04-16: 0.9 mg via INTRAVENOUS
  Administered 2012-04-16: 2.1 mg via INTRAVENOUS
  Administered 2012-04-16: 06:00:00 via INTRAVENOUS
  Administered 2012-04-16: 0.9 mg via INTRAVENOUS
  Filled 2012-04-13 (×3): qty 25

## 2012-04-13 MED ORDER — KETOROLAC TROMETHAMINE 15 MG/ML IJ SOLN
15.0000 mg | Freq: Four times a day (QID) | INTRAMUSCULAR | Status: DC
  Administered 2012-04-13 – 2012-04-17 (×15): 15 mg via INTRAVENOUS
  Filled 2012-04-13 (×24): qty 1

## 2012-04-13 MED ORDER — DIPHENHYDRAMINE HCL 12.5 MG/5ML PO ELIX
12.5000 mg | ORAL_SOLUTION | Freq: Four times a day (QID) | ORAL | Status: DC | PRN
  Filled 2012-04-13: qty 5

## 2012-04-13 MED ORDER — SODIUM CHLORIDE 0.9 % IJ SOLN: 9.0000 mL | INTRAMUSCULAR | Status: DC | PRN

## 2012-04-13 MED ORDER — HYDROMORPHONE HCL PF 1 MG/ML IJ SOLN
0.2500 mg | INTRAMUSCULAR | Status: DC | PRN
  Administered 2012-04-13 (×4): 0.5 mg via INTRAVENOUS

## 2012-04-13 MED ORDER — QUINAPRIL HCL 10 MG PO TABS
20.0000 mg | ORAL_TABLET | Freq: Every day | ORAL | Status: DC
  Filled 2012-04-13: qty 2

## 2012-04-13 MED ORDER — ACETAMINOPHEN 10 MG/ML IV SOLN
INTRAVENOUS | Status: AC
  Filled 2012-04-13: qty 100

## 2012-04-13 MED ORDER — HYDROMORPHONE HCL PF 1 MG/ML IJ SOLN
INTRAMUSCULAR | Status: DC | PRN
  Administered 2012-04-13 (×2): 1 mg via INTRAVENOUS

## 2012-04-13 MED ORDER — LACTATED RINGERS IV SOLN: INTRAVENOUS | Status: DC

## 2012-04-13 MED ORDER — PROPOFOL 10 MG/ML IV EMUL
INTRAVENOUS | Status: DC | PRN
  Administered 2012-04-13: 200 mg via INTRAVENOUS

## 2012-04-13 MED ORDER — HYDROMORPHONE HCL PF 1 MG/ML IJ SOLN
INTRAMUSCULAR | Status: AC
  Filled 2012-04-13: qty 1

## 2012-04-13 MED ORDER — LIDOCAINE HCL (CARDIAC) 20 MG/ML IV SOLN
INTRAVENOUS | Status: DC | PRN
  Administered 2012-04-13: 100 mg via INTRAVENOUS

## 2012-04-13 MED ORDER — LACTATED RINGERS IV SOLN
INTRAVENOUS | Status: DC
  Administered 2012-04-13: 1000 mL via INTRAVENOUS

## 2012-04-13 MED ORDER — LACTATED RINGERS IV SOLN
INTRAVENOUS | Status: DC | PRN
  Administered 2012-04-13 (×2): via INTRAVENOUS

## 2012-04-13 MED ORDER — ACETAMINOPHEN 10 MG/ML IV SOLN
INTRAVENOUS | Status: DC | PRN
  Administered 2012-04-13: 1000 mg via INTRAVENOUS

## 2012-04-13 MED ORDER — BUPIVACAINE-EPINEPHRINE PF 0.25-1:200000 % IJ SOLN
INTRAMUSCULAR | Status: AC
  Filled 2012-04-13: qty 30

## 2012-04-13 MED ORDER — LISINOPRIL 20 MG PO TABS
20.0000 mg | ORAL_TABLET | Freq: Every day | ORAL | Status: DC
  Administered 2012-04-14 – 2012-04-16 (×3): 20 mg via ORAL
  Filled 2012-04-13 (×5): qty 1

## 2012-04-13 MED ORDER — CISATRACURIUM BESYLATE (PF) 10 MG/5ML IV SOLN
INTRAVENOUS | Status: DC | PRN
  Administered 2012-04-13 (×2): 4 mg via INTRAVENOUS
  Administered 2012-04-13: 16 mg via INTRAVENOUS

## 2012-04-13 MED ORDER — KETOROLAC TROMETHAMINE 15 MG/ML IJ SOLN
15.0000 mg | Freq: Once | INTRAMUSCULAR | Status: AC
  Administered 2012-04-13: 15 mg via INTRAVENOUS

## 2012-04-13 MED ORDER — DIPHENHYDRAMINE HCL 50 MG/ML IJ SOLN
INTRAMUSCULAR | Status: AC
  Filled 2012-04-13: qty 1

## 2012-04-13 MED ORDER — PROMETHAZINE HCL 25 MG/ML IJ SOLN
INTRAMUSCULAR | Status: AC
  Filled 2012-04-13: qty 1

## 2012-04-13 MED ORDER — ONDANSETRON HCL 4 MG PO TABS: 4.0000 mg | ORAL_TABLET | Freq: Four times a day (QID) | ORAL | Status: DC | PRN

## 2012-04-13 MED ORDER — LOSARTAN POTASSIUM 50 MG PO TABS
100.0000 mg | ORAL_TABLET | Freq: Every day | ORAL | Status: DC
  Administered 2012-04-14 – 2012-04-16 (×3): 100 mg via ORAL
  Filled 2012-04-13 (×5): qty 2

## 2012-04-13 MED ORDER — IOHEXOL 300 MG/ML  SOLN
INTRAMUSCULAR | Status: AC
  Filled 2012-04-13: qty 1

## 2012-04-13 SURGICAL SUPPLY — 48 items
BANDAGE GAUZE ELAST BULKY 4 IN (GAUZE/BANDAGES/DRESSINGS) ×2 IMPLANT
BLADE EXTENDED COATED 6.5IN (ELECTRODE) ×2 IMPLANT
BLADE HEX COATED 2.75 (ELECTRODE) ×2 IMPLANT
CANISTER SUCTION 2500CC (MISCELLANEOUS) ×2 IMPLANT
CLIP TI LARGE 6 (CLIP) IMPLANT
CLOTH BEACON ORANGE TIMEOUT ST (SAFETY) ×2 IMPLANT
COVER MAYO STAND STRL (DRAPES) ×2 IMPLANT
DRAPE LAPAROSCOPIC ABDOMINAL (DRAPES) ×2 IMPLANT
DRAPE LG THREE QUARTER DISP (DRAPES) ×2 IMPLANT
DRAPE UTILITY XL STRL (DRAPES) ×2 IMPLANT
DRSG PAD ABDOMINAL 8X10 ST (GAUZE/BANDAGES/DRESSINGS) ×2 IMPLANT
ELECT REM PT RETURN 9FT ADLT (ELECTROSURGICAL) ×2
ELECTRODE REM PT RTRN 9FT ADLT (ELECTROSURGICAL) ×1 IMPLANT
GLOVE BIO SURGEON STRL SZ7 (GLOVE) ×4 IMPLANT
GLOVE BIOGEL PI IND STRL 7.0 (GLOVE) ×1 IMPLANT
GLOVE BIOGEL PI IND STRL 7.5 (GLOVE) ×1 IMPLANT
GLOVE BIOGEL PI INDICATOR 7.0 (GLOVE) ×1
GLOVE BIOGEL PI INDICATOR 7.5 (GLOVE) ×1
GOWN STRL NON-REIN LRG LVL3 (GOWN DISPOSABLE) ×4 IMPLANT
GOWN STRL REIN XL XLG (GOWN DISPOSABLE) ×2 IMPLANT
KIT BASIN OR (CUSTOM PROCEDURE TRAY) ×2 IMPLANT
LEGGING LITHOTOMY PAIR STRL (DRAPES) ×2 IMPLANT
LIGASURE IMPACT 36 18CM CVD LR (INSTRUMENTS) ×2 IMPLANT
NS IRRIG 1000ML POUR BTL (IV SOLUTION) ×4 IMPLANT
PACK GENERAL/GYN (CUSTOM PROCEDURE TRAY) ×2 IMPLANT
SPONGE GAUZE 4X4 12PLY (GAUZE/BANDAGES/DRESSINGS) IMPLANT
SPONGE LAP 18X18 X RAY DECT (DISPOSABLE) ×2 IMPLANT
STAPLER VISISTAT 35W (STAPLE) ×2 IMPLANT
SUCTION POOLE TIP (SUCTIONS) ×2 IMPLANT
SUT PDS AB 1 CTX 36 (SUTURE) ×2 IMPLANT
SUT PDS AB 1 TP1 96 (SUTURE) ×4 IMPLANT
SUT PDS AB 3-0 CT2 27 (SUTURE) IMPLANT
SUT PDS AB 3-0 SH 27 (SUTURE) IMPLANT
SUT PDS AB 4-0 SH 27 (SUTURE) IMPLANT
SUT PROLENE 2 0 BLUE (SUTURE) IMPLANT
SUT SILK 2 0 (SUTURE) ×1
SUT SILK 2 0 SH CR/8 (SUTURE) ×2 IMPLANT
SUT SILK 2 0SH CR/8 30 (SUTURE) IMPLANT
SUT SILK 2-0 18XBRD TIE 12 (SUTURE) ×1 IMPLANT
SUT SILK 2-0 30XBRD TIE 12 (SUTURE) IMPLANT
SUT SILK 3 0 (SUTURE) ×1
SUT SILK 3 0 SH CR/8 (SUTURE) ×14 IMPLANT
SUT SILK 3-0 18XBRD TIE 12 (SUTURE) ×1 IMPLANT
SUT VIC AB 0 CT1 27 (SUTURE) ×1
SUT VIC AB 0 CT1 27XBRD ANTBC (SUTURE) ×1 IMPLANT
TOWEL OR 17X26 10 PK STRL BLUE (TOWEL DISPOSABLE) ×4 IMPLANT
TRAY FOLEY CATH 14FRSI W/METER (CATHETERS) ×2 IMPLANT
YANKAUER SUCT BULB TIP NO VENT (SUCTIONS) ×2 IMPLANT

## 2012-04-13 NOTE — Progress Notes (Signed)
Complete bowel prep as directed by doctor.

## 2012-04-13 NOTE — Transfer of Care (Signed)
Immediate Anesthesia Transfer of Care Note  Patient: Douglas Wiggins  Procedure(s) Performed: Procedure(s) (LRB): COLOSTOMY CLOSURE (N/A)  Patient Location: PACU  Anesthesia Type: General  Level of Consciousness: awake, alert  and patient cooperative  Airway & Oxygen Therapy: Patient Spontanous Breathing and Patient connected to face mask oxygen  Post-op Assessment: Report given to PACU RN, Post -op Vital signs reviewed and stable and Patient moving all extremities  Post vital signs: Reviewed and stable  Complications: No apparent anesthesia complications

## 2012-04-13 NOTE — Anesthesia Preprocedure Evaluation (Addendum)
Anesthesia Evaluation  Patient identified by MRN, date of birth, ID band Patient awake    Reviewed: Allergy & Precautions, H&P , NPO status , Patient's Chart, lab work & pertinent test results  Airway Mallampati: II TM Distance: >3 FB Neck ROM: full    Dental No notable dental hx. (+) Teeth Intact and Dental Advisory Given   Pulmonary neg pulmonary ROS, sleep apnea ,  Mild OSA.  No CPAP. breath sounds clear to auscultation  Pulmonary exam normal       Cardiovascular Exercise Tolerance: Good hypertension, Pt. on medications negative cardio ROS  Rhythm:regular Rate:Normal     Neuro/Psych Depression negative neurological ROS  negative psych ROS   GI/Hepatic negative GI ROS, Neg liver ROS, GERD-  Medicated and Controlled,  Endo/Other  negative endocrine ROSHypothyroidism   Renal/GU negative Renal ROS  negative genitourinary   Musculoskeletal   Abdominal   Peds  Hematology negative hematology ROS (+)   Anesthesia Other Findings   Reproductive/Obstetrics negative OB ROS                          Anesthesia Physical Anesthesia Plan  ASA: II  Anesthesia Plan: General   Post-op Pain Management:    Induction: Intravenous  Airway Management Planned: Oral ETT  Additional Equipment:   Intra-op Plan:   Post-operative Plan: Extubation in OR  Informed Consent: I have reviewed the patients History and Physical, chart, labs and discussed the procedure including the risks, benefits and alternatives for the proposed anesthesia with the patient or authorized representative who has indicated his/her understanding and acceptance.   Dental Advisory Given  Plan Discussed with: CRNA and Surgeon  Anesthesia Plan Comments:        Anesthesia Quick Evaluation

## 2012-04-13 NOTE — Op Note (Signed)
Pre-op Diagnosis:  Perforated diverticulitis status post Hartman's procedure Postop diagnosis: Same Procedure: Colostomy reversal Surgeon: Quintasia Theroux K. Assistant: Dr. Darnell Level Anesthesia: Gen. endotracheal Indications: This is a 46 year old male who is status post perforated diverticulitis who underwent an emergent Hartman's procedure earlier this year. He presents now for colostomy reversal. Contrast studies through his colostomy as well as his rectal stump show no sign of leak or further disease.  Description of procedure: The patient was brought to the operating room and placed in a supine position on the operating room table. After an adequate level of general anesthesia was obtained a Foley catheter was placed in the still technique. The colostomy bag was removed and the pursestring suture of 2-0 silk was placed to close the colostomy. The skin around the ostomy was prepped with Betadine. The remainder of the abdominal wall was prepped with ChloraPrep and draped in sterile fashion. We excised his previous scar. Dissection was carried down the fascia. The fascia was opened vertically. We bluntly dissected into the peritoneal cavity. There were some flimsy omental adhesions to the anterior abdominal wall but overall he had minimal adhesions. We open the fascia widely. We placed the Balfour retractor for exposure. We dissected down into the pelvis. The rectal stump was densely adherent to the bladder. We were able to dissect these apart. The distal stump was actually fairly long and involves the distal sigmoid colon. We adequately mobilized the distal stump. We then turned our attention to the colostomy. We dissected down around the colostomy inside the abdomen until we had the fascia clear in all directions. We then made an elliptical skin incision around the colostomy site. We dissected down around the colostomy down to the fascia. The fascia was opened and we bluntly dissected into the peritoneal  cavity. We completely mobilized the descending colon until it reached the distal stump with no tension. We amputated approximately 10 cm of the proximal colon packed a clean edge. We removed the staple line at the distal stump. We then created a end-to-end handsewn anastomosis with a single layer of 3-0 silk sutures. The anastomosis was palpated and was widely patent. There did not seem to be any spaces between the sutures to allow leak. We then irrigated thoroughly. Hemostasis was good. We closed the fascia at the ostomy site with internal layer of 0 Vicryl and a fascial layer of #1 PDS. The fascia was then reapproximated with double-stranded #1 PDS. The subcutaneous tissues were irrigated. We excised a portion of the umbilicus to allow the umbilicus lay flatter. Staples were used to close the skin in the midline. The ostomy site was packed with gauze. Dry dressings were applied. The patient was then extubated and brought to recovery was stable condition. All sponge, instrument, and needle counts are correct.  [name]. [name], MD, [facility]  04/13/2012 12:00 PM

## 2012-04-13 NOTE — Anesthesia Postprocedure Evaluation (Signed)
  Anesthesia Post-op Note  Patient: Douglas Wiggins  Procedure(s) Performed: Procedure(s) (LRB): COLOSTOMY CLOSURE (N/A)  Patient Location: PACU  Anesthesia Type: General  Level of Consciousness: awake and alert   Airway and Oxygen Therapy: Patient Spontanous Breathing  Post-op Pain: mild  Post-op Assessment: Post-op Vital signs reviewed, Patient's Cardiovascular Status Stable, Respiratory Function Stable, Patent Airway and No signs of Nausea or vomiting  Post-op Vital Signs: stable  Complications: No apparent anesthesia complications

## 2012-04-13 NOTE — Interval H&P Note (Signed)
History and Physical Interval Note:  04/13/2012 7:05 AM  Douglas Wiggins  has presented today for surgery, with the diagnosis of colostomy  The various methods of treatment have been discussed with the patient and family. After consideration of risks, benefits and other options for treatment, the patient has consented to  Procedure(s) (LRB): COLOSTOMY CLOSURE (N/A) as a surgical intervention .  The patients' history has been reviewed, patient examined, no change in status, stable for surgery.  I have reviewed the patients' chart and labs.  Questions were answered to the patient's satisfaction.     [name]

## 2012-04-13 NOTE — H&P (Signed)
HPI Douglas Wiggins is a 46 y.o. male.   HPI He is s/p emergent Hartmann's resection on 01/07/12 for perforated sigmoid diverticulitis.  He is doing well after his colostomy.  His incision is completely healed.  He has developed a parastomal hernia and has some discomfort associated with this.  He is eager to have his colostomy reversed. Past Medical History   Diagnosis  Date   .  Hypertension     .  Thyroid disease     .  Hyperthyroidism     .  Sleep apnea         does not wear cpap   .  Depression     .  Diverticulitis           Past Surgical History   Procedure  Date   .  Knee surgery     .  Shoulder surgery     .  Colon surgery  01/2012   .  Colostomy revision  01/06/2012       Procedure: COLON RESECTION SIGMOID;  Surgeon: [name]. [name], MD;  Location: [facility];  Service: General;  Laterality: N/A;  with hartmans procedure        No family history on file.   Social History History   Substance Use Topics   .  Smoking status:  Never Smoker    .  Smokeless tobacco:  Never Used   .  Alcohol Use:  No         Allergies   Allergen  Reactions   .  Sulfa Antibiotics  Hives and Rash         Current Outpatient Prescriptions   Medication  Sig  Dispense  Refill   .  amoxicillin (AMOXIL) 500 MG capsule           .  HYDROcodone-acetaminophen (NORCO) 5-325 MG per tablet  Take 1 tablet by mouth every 4 (four) hours as needed for pain.   40 tablet   0   .  levothyroxine (SYNTHROID, LEVOTHROID) 75 MCG tablet  Take 75 mcg by mouth daily.         Marland Kitchen  lisinopril (PRINIVIL,ZESTRIL) 20 MG tablet  Take 20 mg by mouth daily.         Marland Kitchen  losartan (COZAAR) 100 MG tablet  Twice daily.         .  meloxicam (MOBIC) 7.5 MG tablet  Take 7.5 mg by mouth daily.         Sherren Mocha Supplies (NEW IMAGE DRAINABLE POUCH) MISC  by Does not apply route.         .  quinapril (ACCUPRIL) 20 MG tablet  Daily.         .  sodium chloride irrigation 0.9 % irrigation  Twice daily.               Review of Systems Review of Systems  Constitutional: Negative for fever, chills and unexpected weight change.  HENT: Negative for hearing loss, congestion, sore throat, trouble swallowing and voice change.   Eyes: Negative for visual disturbance.  Respiratory: Negative for cough and wheezing.   Cardiovascular: Negative for chest pain, palpitations and leg swelling.  Gastrointestinal: Positive for abdominal pain. Negative for nausea, vomiting, diarrhea, constipation, blood in stool, abdominal distention, anal bleeding and rectal pain.  Genitourinary: Negative for hematuria and difficulty urinating.  Musculoskeletal: Negative for arthralgias.  Skin: Negative for rash and wound.  Neurological: Negative for  seizures, syncope, weakness and headaches.  Hematological: Negative for adenopathy. Does not bruise/bleed easily.  Psychiatric/Behavioral: Negative for confusion.      Blood pressure 121/81, pulse 86, temperature 97.6 F (36.4 C), temperature source Temporal, resp. rate 18, height 6\' 4"  (1.93 m), weight 259 lb 6.4 oz (117.663 kg).   Physical Exam Physical Exam WDWN in NAD HEENT:  EOMI, sclera anicteric Neck:  No masses, no thyromegaly Lungs:  CTA bilaterally; normal respiratory effort CV:  Regular rate and rhythm; no murmurs Abd:  +bowel sounds, soft, healed midline scar;  Moderate-sized left parastomal hernia; no prolapse of the colostomy Ext:  Well-perfused; no edema Skin:  Warm, dry; no sign of jaundice   Data Reviewed none   Assessment    Sigmoid diverticulitis s/p Hartmann's resection Parastomal hernia     Plan    Contrast study via rectal stump and colostomy If normal, will plan colostomy reversal in May, which will be 3 month post-op.  The surgical procedure has been discussed with the patient.  Potential risks, benefits, alternative treatments, and expected outcomes have been explained.  All of the patient's questions at this time have been answered.   The likelihood of reaching the patient's treatment goal is good.  The patient understand the proposed surgical procedure and wishes to proceed. We will use a one-day bowel prep.       [name]. [name], MD, [facility]  04/13/2012 7:04 AM

## 2012-04-14 ENCOUNTER — Encounter (HOSPITAL_COMMUNITY): Payer: Self-pay | Admitting: Surgery

## 2012-04-14 LAB — CBC
HCT: 45.1 % (ref 39.0–52.0)
Hemoglobin: 15.9 g/dL (ref 13.0–17.0)
MCH: 29.8 pg (ref 26.0–34.0)
MCHC: 35.3 g/dL (ref 30.0–36.0)
RBC: 5.33 MIL/uL (ref 4.22–5.81)

## 2012-04-14 LAB — BASIC METABOLIC PANEL
BUN: 20 mg/dL (ref 6–23)
CO2: 28 mEq/L (ref 19–32)
Chloride: 96 mEq/L (ref 96–112)
Glucose, Bld: 132 mg/dL — ABNORMAL HIGH (ref 70–99)
Potassium: 4.4 mEq/L (ref 3.5–5.1)
Sodium: 133 mEq/L — ABNORMAL LOW (ref 135–145)

## 2012-04-14 MED ORDER — SODIUM CHLORIDE 0.9 % IV BOLUS (SEPSIS): 500.0000 mL | Freq: Once | INTRAVENOUS | Status: DC

## 2012-04-14 NOTE — Progress Notes (Signed)
1 Day Post-Op  Subjective: Sore, but doing better than yesterday Nausea has resolved. Has not yet voided since Foley removed this morning  Objective: Vital signs in last 24 hours: Temp:  [97.4 F (36.3 C)-99.4 F (37.4 C)] 97.4 F (36.3 C) (05/14 0527) Pulse Rate:  [68-126] 114  (05/14 0527) Resp:  [13-26] 22  (05/14 0527) BP: (139-166)/(82-101) 141/91 mmHg (05/14 0527) SpO2:  [93 %-100 %] 95 % (05/14 0527) Weight:  [272 lb (123.378 kg)] 272 lb (123.378 kg) (05/13 1330)    Intake/Output from previous day: 05/13 0701 - 05/14 0700 In: 7750 [P.O.:3800; I.V.:3950] Out: 1500 [Urine:1375; Blood:125] Intake/Output this shift:    General appearance: alert, cooperative and no distress GI: quiet, mildly distended; incisional tenderness Midline incision - some oozing at lower end of incision LLQ wound - clean; no oozing; will begin dressing changes  Lab Results:   Basename 04/14/12 0430 04/13/12 2342  WBC 13.3* 14.2*  HGB 15.9 16.1  HCT 45.1 45.4  PLT 183 197   BMET  Basename 04/14/12 0430 04/13/12 1415  NA 133* --  K 4.4 --  CL 96 --  CO2 28 --  GLUCOSE 132* --  BUN 20 --  CREATININE 1.21 0.89  CALCIUM 8.8 --   PT/INR No results found for this basename: LABPROT:2,INR:2 in the last 72 hours ABG No results found for this basename: PHART:2,PCO2:2,PO2:2,HCO3:2 in the last 72 hours  Studies/Results: No results found.  Anti-infectives: Anti-infectives     Start     Dose/Rate Route Frequency Ordered Stop   04/13/12 1500   ertapenem (INVANZ) 1 g in sodium chloride 0.9 % 50 mL IVPB        1 g 100 mL/hr over 30 Minutes Intravenous Every 24 hours 04/13/12 1356 04/13/12 1546   04/13/12 0626   ertapenem (INVANZ) 1 g in sodium chloride 0.9 % 50 mL IVPB        1 g 100 mL/hr over 30 Minutes Intravenous 60 min pre-op 04/13/12 0626 04/13/12 0855          Assessment/Plan: s/p Procedure(s) (LRB): COLOSTOMY CLOSURE (N/A) d/c foley Encourage ambulation Clear  liquids Await bowel function   LOS: 1 day    Dalbert Stillings K. 04/14/2012

## 2012-04-14 NOTE — Progress Notes (Signed)
UR complete 

## 2012-04-15 MED ORDER — PANTOPRAZOLE SODIUM 40 MG IV SOLR
40.0000 mg | INTRAVENOUS | Status: DC
  Administered 2012-04-15: 40 mg via INTRAVENOUS
  Filled 2012-04-15 (×2): qty 40

## 2012-04-15 NOTE — Progress Notes (Signed)
2 Days Post-Op  Subjective: Patient has voided, passed some flatus Still quite distended No nausea, but no appetite Oozing from colostomy site  Objective: Vital signs in last 24 hours: Temp:  [97.7 F (36.5 C)-99.6 F (37.6 C)] 98 F (36.7 C) (05/15 0200) Pulse Rate:  [101-114] 103  (05/15 0200) Resp:  [18-23] 22  (05/15 0408) BP: (133-142)/(76-100) 142/76 mmHg (05/15 0200) SpO2:  [93 %-98 %] 98 % (05/15 0408) FiO2 (%):  [33 %-39 %] 33 % (05/15 0408) Last BM Date: 04/12/12  Intake/Output from previous day: 05/14 0701 - 05/15 0700 In: 420 [P.O.:420] Out: 2095 [Urine:2095] Intake/Output this shift: Total I/O In: -  Out: 1175 [Urine:1175]  General appearance: alert, cooperative and no distress GI: distended, active bowel sounds - audible even without stethoscope Midline incision clean and intact; some oozing from the edges of the colostomy site  Lab Results:   Basename 04/14/12 0430 04/13/12 2342  WBC 13.3* 14.2*  HGB 15.9 16.1  HCT 45.1 45.4  PLT 183 197   BMET  Basename 04/14/12 0430 04/13/12 1415  NA 133* --  K 4.4 --  CL 96 --  CO2 28 --  GLUCOSE 132* --  BUN 20 --  CREATININE 1.21 0.89  CALCIUM 8.8 --   PT/INR No results found for this basename: LABPROT:2,INR:2 in the last 72 hours ABG No results found for this basename: PHART:2,PCO2:2,PO2:2,HCO3:2 in the last 72 hours  Studies/Results: No results found.  Anti-infectives: Anti-infectives     Start     Dose/Rate Route Frequency Ordered Stop   04/13/12 1500   ertapenem (INVANZ) 1 g in sodium chloride 0.9 % 50 mL IVPB        1 g 100 mL/hr over 30 Minutes Intravenous Every 24 hours 04/13/12 1356 04/13/12 1546   04/13/12 0626   ertapenem (INVANZ) 1 g in sodium chloride 0.9 % 50 mL IVPB        1 g 100 mL/hr over 30 Minutes Intravenous 60 min pre-op 04/13/12 0626 04/13/12 0855          Assessment/Plan: s/p Procedure(s) (LRB): COLOSTOMY CLOSURE (N/A) Continue clears for today, Encourage  ambulation Hold lovenox today Continue dressing changes Good progress  LOS: 2 days    Douglas Whittington K. 04/15/2012

## 2012-04-15 NOTE — Progress Notes (Signed)
Patient request med for heartburn. Dr. Abbey Chatters return call. T.O.V. for Protonix 40mg  IV daily. First dose now.

## 2012-04-16 LAB — CBC
HCT: 33.3 % — ABNORMAL LOW (ref 39.0–52.0)
Hemoglobin: 11.6 g/dL — ABNORMAL LOW (ref 13.0–17.0)
MCH: 29.4 pg (ref 26.0–34.0)
MCHC: 34.8 g/dL (ref 30.0–36.0)
RDW: 13.2 % (ref 11.5–15.5)

## 2012-04-16 LAB — BASIC METABOLIC PANEL
BUN: 13 mg/dL (ref 6–23)
Calcium: 8.7 mg/dL (ref 8.4–10.5)
GFR calc Af Amer: >90 mL/min (ref >90)
GFR calc non Af Amer: 80 mL/min — ABNORMAL LOW (ref >90)
Glucose, Bld: 123 mg/dL — ABNORMAL HIGH (ref 70–99)
Potassium: 3.6 mEq/L (ref 3.5–5.1)
Sodium: 131 mEq/L — ABNORMAL LOW (ref 135–145)

## 2012-04-16 MED ORDER — MORPHINE SULFATE 2 MG/ML IJ SOLN
2.0000 mg | INTRAMUSCULAR | Status: DC | PRN
  Administered 2012-04-16 (×2): 4 mg via INTRAVENOUS
  Filled 2012-04-16 (×2): qty 2

## 2012-04-16 MED ORDER — PANTOPRAZOLE SODIUM 20 MG PO TBEC
20.0000 mg | DELAYED_RELEASE_TABLET | Freq: Every day | ORAL | Status: DC
  Administered 2012-04-16 – 2012-04-17 (×2): 20 mg via ORAL
  Filled 2012-04-16 (×3): qty 1

## 2012-04-16 MED ORDER — DIPHENHYDRAMINE HCL 25 MG PO CAPS: 25.0000 mg | ORAL_CAPSULE | Freq: Four times a day (QID) | ORAL | Status: DC | PRN

## 2012-04-16 MED ORDER — DIPHENHYDRAMINE HCL 50 MG/ML IJ SOLN
25.0000 mg | Freq: Four times a day (QID) | INTRAMUSCULAR | Status: DC | PRN
  Administered 2012-04-16: 25 mg via INTRAVENOUS
  Administered 2012-04-16: 11:00:00 via INTRAVENOUS
  Filled 2012-04-16 (×2): qty 1

## 2012-04-16 MED ORDER — OXYCODONE-ACETAMINOPHEN 5-325 MG PO TABS
1.0000 | ORAL_TABLET | ORAL | Status: DC | PRN
  Administered 2012-04-16 – 2012-04-17 (×3): 1 via ORAL
  Filled 2012-04-16 (×3): qty 1

## 2012-04-16 NOTE — Progress Notes (Signed)
3 Days Post-Op  Subjective: Patient reports much flatus, one liquid bowel movements. Had some nausea/ vomiting yesterday morning, but feels much better now. Ambulating in the halls  Objective: Vital signs in last 24 hours: Temp:  [98.5 F (36.9 C)-98.9 F (37.2 C)] 98.9 F (37.2 C) (05/16 0530) Pulse Rate:  [90-112] 91  (05/16 0530) Resp:  [16-20] 16  (05/16 0629) BP: (113-167)/(74-102) 125/77 mmHg (05/16 0530) SpO2:  [91 %-98 %] 96 % (05/16 0629) FiO2 (%):  [34 %-42 %] 34 % (05/16 0629) Last BM Date: 04/12/12  Intake/Output from previous day: 05/15 0701 - 05/16 0700 In: 3415.4 [P.O.:480; I.V.:2935.4] Out: 526 [Urine:525; Emesis/NG output:1] Intake/Output this shift:    General appearance: alert, cooperative and no distress GI: soft, less distended; incisional tenderness Midline incision c/d/i LLQ ostomy site  - no more oozing; clean; serous drainage. Lab Results:   Basename 04/16/12 0439 04/14/12 0430  WBC 9.3 13.3*  HGB 11.6* 15.9  HCT 33.3* 45.1  PLT 158 183   BMET  Basename 04/16/12 0439 04/14/12 0430  NA 131* 133*  K 3.6 4.4  CL 98 96  CO2 27 28  GLUCOSE 123* 132*  BUN 13 20  CREATININE 1.11 1.21  CALCIUM 8.7 8.8   PT/INR No results found for this basename: LABPROT:2,INR:2 in the last 72 hours ABG No results found for this basename: PHART:2,PCO2:2,PO2:2,HCO3:2 in the last 72 hours  Studies/Results: No results found.  Anti-infectives: Anti-infectives     Start     Dose/Rate Route Frequency Ordered Stop   04/13/12 1500   ertapenem (INVANZ) 1 g in sodium chloride 0.9 % 50 mL IVPB        1 g 100 mL/hr over 30 Minutes Intravenous Every 24 hours 04/13/12 1356 04/13/12 1546   04/13/12 0626   ertapenem (INVANZ) 1 g in sodium chloride 0.9 % 50 mL IVPB        1 g 100 mL/hr over 30 Minutes Intravenous 60 min pre-op 04/13/12 0626 04/13/12 0855          Assessment/Plan: s/p Procedure(s) (LRB): COLOSTOMY CLOSURE (N/A) Advance diet Decrease IV  fluids PO pain meds d/c PCA Possibly home in 1-2 days Patient and his mother can handle the daily NS wet to dry to ostomy site - no need for home health  LOS: 3 days    Loletta Harper K. 04/16/2012

## 2012-04-16 NOTE — Progress Notes (Signed)
Patient c/o itching.  PCA discontinued. Notified Dr [name] and order given for benadryl IV/PO.

## 2012-04-17 MED ORDER — OXYCODONE-ACETAMINOPHEN 5-325 MG PO TABS: 1.0000 | ORAL_TABLET | ORAL | Status: AC | PRN

## 2012-04-17 NOTE — Progress Notes (Signed)
4 Days Post-Op  Subjective: Several bowel movements yesterday.  Tolerating regular diet Pain under control with Percocet Wants to go home - mother will do dressing changes.  Objective: Vital signs in last 24 hours: Temp:  [98.5 F (36.9 C)-101.5 F (38.6 C)] 98.5 F (36.9 C) (05/17 0555) Pulse Rate:  [91-104] 93  (05/17 0555) Resp:  [16-22] 16  (05/17 0555) BP: (128-158)/(77-113) 128/77 mmHg (05/17 0555) SpO2:  [95 %-97 %] 96 % (05/17 0555) Last BM Date: 04/16/12  Intake/Output from previous day: 05/16 0701 - 05/17 0700 In: 1823.3 [P.O.:480; I.V.:1343.3] Out: 800 [Urine:800] Intake/Output this shift: Total I/O In: -  Out: 100 [Urine:100]  General appearance: alert, cooperative and no distress GI: soft, minimal distention, active bowel sounds Incision - some bruising LLQ wound - beginning to granulate, clean   Lab Results:   [facility] 04/16/12 0439  WBC 9.3  HGB 11.6*  HCT 33.3*  PLT 158   BMET  Basename 04/16/12 0439  NA 131*  K 3.6  CL 98  CO2 27  GLUCOSE 123*  BUN 13  CREATININE 1.11  CALCIUM 8.7   PT/INR No results found for this basename: LABPROT:2,INR:2 in the last 72 hours ABG No results found for this basename: PHART:2,PCO2:2,PO2:2,HCO3:2 in the last 72 hours  Studies/Results: No results found.  Anti-infectives: Anti-infectives     Start     Dose/Rate Route Frequency Ordered Stop   04/13/12 1500   ertapenem (INVANZ) 1 g in sodium chloride 0.9 % 50 mL IVPB        1 g 100 mL/hr over 30 Minutes Intravenous Every 24 hours 04/13/12 1356 04/13/12 1546   04/13/12 0626   ertapenem (INVANZ) 1 g in sodium chloride 0.9 % 50 mL IVPB        1 g 100 mL/hr over 30 Minutes Intravenous 60 min pre-op 04/13/12 0626 04/13/12 0855          Assessment/Plan: s/p Procedure(s) (LRB): COLOSTOMY CLOSURE (N/A) Discharge Mother will perform dressing changes.   LOS: 4 days    Hobart Marte K. 04/17/2012

## 2012-04-17 NOTE — Discharge Summary (Signed)
Physician Discharge Summary  Patient ID: Douglas Wiggins MRN: [id number] DOB/AGE: 44-Mar-1967 44 y.o.  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 *    Discharged Condition: good  Hospital Course: Colostomy reversal on 5/13.  Hand-sewn anastomosis.  Post-op ileus resolved on POD #3.  Diet advanced - changed to PO pain meds.  LLQ ostomy site - wet to dry dressings  Consults: None  Significant Diagnostic Studies: none  Treatments: surgery: colostomy reversal  Discharge Exam: Blood pressure 128/77, pulse 93, temperature 98.5 F (36.9 C), temperature source Oral, resp. rate 16, height 6\' 4"  (1.93 m), weight 272 lb (123.378 kg), SpO2 96.00%. Bruising around midline incision; LLQ wound granulating  Disposition: Home; family will perform dressing changes  Discharge Orders    Future Orders Please Complete By Expires   Diet general      Increase activity slowly      May walk up steps      May shower / Bathe      Driving Restrictions      Comments:   Do not drive while taking pain medications   Call MD for:  temperature >100.4      Call MD for:  persistant nausea and vomiting      Call MD for:  severe uncontrolled pain      Call MD for:  redness, tenderness, or signs of infection (pain, swelling, redness, odor or green/yellow discharge around incision site)        Medication List  As of 04/17/2012 10:54 AM   STOP taking these medications         HYDROcodone-acetaminophen 5-325 MG per tablet         TAKE these medications         levothyroxine 75 MCG tablet   Commonly known as: SYNTHROID, LEVOTHROID   Take 75 mcg by mouth daily with supper.      losartan 100 MG tablet   Commonly known as: COZAAR   100 mg daily with supper.      meloxicam 15 MG tablet   Commonly known as: MOBIC   Take 15 mg by  mouth daily.      New Image Drainable Pouch Misc   by Does not apply route.      oxyCODONE-acetaminophen 5-325 MG per tablet   Commonly known as: PERCOCET   Take 1 tablet by mouth every 4 (four) hours as needed.      quinapril 20 MG tablet   Commonly known as: ACCUPRIL   Take 20 mg by mouth daily with supper.      sodium chloride irrigation 0.9 % irrigation   Twice daily.           Follow-up Information    Follow up with Wynona Luna., MD. Schedule an appointment as soon as possible for a visit in 1 week.   Contact information:   [employer] [address] [place] [address] [phone]          Signed: Wynona Luna. 04/17/2012, 10:54 AM

## 2012-04-17 NOTE — Discharge Instructions (Signed)
[facility]      [facility] [phone]  OPEN ABDOMINAL SURGERY: POST OP INSTRUCTIONS  Always review your discharge instruction sheet given to you by the facility where your surgery was performed.  IF YOU HAVE DISABILITY OR FAMILY LEAVE FORMS, YOU MUST BRING THEM TO THE OFFICE FOR PROCESSING.  PLEASE DO NOT GIVE THEM TO YOUR DOCTOR.  1. A prescription for pain medication may be given to you upon discharge.  Take your pain medication as prescribed, if needed.  If narcotic pain medicine is not needed, then you may take acetaminophen (Tylenol) or ibuprofen (Advil) as needed. 2. Take your usually prescribed medications unless otherwise directed. 3. If you need a refill on your pain medication, please contact your pharmacy. They will contact our office to request authorization.  Prescriptions will not be filled after 5pm or on week-ends. 4. You should follow a light diet the first few days after arrival home, such as soup and crackers, pudding, etc.unless your doctor has advised otherwise. A high-fiber, low fat diet can be resumed as tolerated.   Be sure to include lots of fluids daily. Most patients will experience some swelling and bruising on the chest and neck area.  Ice packs will help.  Swelling and bruising can take several days to resolve 5. Most patients will experience some swelling and bruising in the area of the incision. Ice pack will help. Swelling and bruising can take several days to resolve..  6. It is common to experience some constipation if taking pain medication after surgery.  Increasing fluid intake and taking a stool softener will usually help or prevent this problem from occurring.  A mild laxative (Milk of Magnesia or Miralax) should be taken according to package directions if there are no bowel movements after 48 hours. 7.  You may have steri-strips (small skin tapes) in place directly over the incision.  These strips should be left on the skin for 7-10 days.  If your  surgeon used skin glue on the incision, you may shower in 24 hours.  The glue will flake off over the next 2-3 weeks.  Any sutures or staples will be removed at the office during your follow-up visit. You may find that a light gauze bandage over your incision may keep your staples from being rubbed or pulled. You may shower and replace the bandage daily. 8. ACTIVITIES:  You may resume regular (light) daily activities beginning the next day--such as daily self-care, walking, climbing stairs--gradually increasing activities as tolerated.  You may have sexual intercourse when it is comfortable.  Refrain from any heavy lifting or straining until approved by your doctor. a. You may drive when you no longer are taking prescription pain medication, you can comfortably wear a seatbelt, and you can safely maneuver your car and apply brakes b. Return to Work: ___________________________________ 9. You should see your doctor in the office for a follow-up appointment approximately two weeks after your surgery.  Make sure that you call for this appointment within a day or two after you arrive home to insure a convenient appointment time. OTHER INSTRUCTIONS:  _____________________________________________________________ _____________________________________________________________  WHEN TO CALL YOUR DOCTOR: 1. Fever over 101.0 2. Inability to urinate 3. Nausea and/or vomiting 4. Extreme swelling or bruising 5. Continued bleeding from incision. 6. Increased pain, redness, or drainage from the incision. 7. Difficulty swallowing or breathing 8. Muscle cramping or spasms. 9. Numbness or tingling in hands or feet or around lips.  The clinic staff is available to   answer your questions during regular business hours.  Please don't hesitate to call and ask to speak to one of the nurses if you have concerns.  For further questions, please visit [email or web address]   

## 2012-04-21 ENCOUNTER — Other Ambulatory Visit (INDEPENDENT_AMBULATORY_CARE_PROVIDER_SITE_OTHER): Payer: Self-pay | Admitting: Surgery

## 2012-04-21 DIAGNOSIS — R509 Fever, unspecified: Secondary | ICD-10-CM

## 2012-04-21 LAB — CBC
HCT: 35.7 % — ABNORMAL LOW (ref 39.0–52.0)
Hemoglobin: 12.7 g/dL — ABNORMAL LOW (ref 13.0–17.0)
MCHC: 35.6 g/dL (ref 30.0–36.0)
MCV: 81.1 fL (ref 78.0–100.0)
RDW: 13.4 % (ref 11.5–15.5)
WBC: 8.8 10*3/uL (ref 4.0–10.5)

## 2012-04-23 ENCOUNTER — Encounter (INDEPENDENT_AMBULATORY_CARE_PROVIDER_SITE_OTHER): Payer: Self-pay | Admitting: Surgery

## 2012-04-23 ENCOUNTER — Ambulatory Visit (INDEPENDENT_AMBULATORY_CARE_PROVIDER_SITE_OTHER): Payer: BC Managed Care – PPO | Admitting: Surgery

## 2012-04-23 VITALS — BP 152/80 | HR 96 | Temp 97.6°F | Ht 76.0 in | Wt 260.0 lb

## 2012-04-23 DIAGNOSIS — K5732 Diverticulitis of large intestine without perforation or abscess without bleeding: Secondary | ICD-10-CM

## 2012-04-23 DIAGNOSIS — K5720 Diverticulitis of large intestine with perforation and abscess without bleeding: Secondary | ICD-10-CM

## 2012-04-23 MED ORDER — HYDROCODONE-ACETAMINOPHEN 5-500 MG PO TABS: 1.0000 | ORAL_TABLET | ORAL | Status: AC | PRN

## 2012-04-23 NOTE — Progress Notes (Signed)
S/p colostomy closure on 04/13/12.  He was discharged home with wet to dry dressings to his colostomy site.  Yesterday, he began feeling better and regaining his appetite.  The lower part of his incision shows some bloody drainage.  The upper third of his incision is erythematous.  Bowel movements are returning to normal and are associated with some mild LLQ cramping.    Filed Vitals:   04/23/12 0905  BP: 152/80  Pulse: 96  Temp: 97.6 F (36.4 C)   Lab Results  Component Value Date   WBC 8.8 04/21/2012   HGB 12.7* 04/21/2012   HCT 35.7* 04/21/2012   MCV 81.1 04/21/2012   PLT 412* 04/21/2012    Abd - soft; active bowel sounds LLQ ostomy site - well-granulated; minimal drainage The lower third of his incision is opened and a large old hematoma is evacuated.  No purulence noted here. The upper third of his incision is opened and some purulent fluid evacuated.    All three wounds are packed with saline-soaked gauze.  His mother is well-versed in dressing changes and will continue daily changes.  Follow-up 1 1/2 weeks.  [name]. [name], MD, [facility]  04/23/2012 3:08 PM

## 2012-04-24 ENCOUNTER — Encounter (INDEPENDENT_AMBULATORY_CARE_PROVIDER_SITE_OTHER): Payer: BC Managed Care – PPO | Admitting: Surgery

## 2012-05-07 ENCOUNTER — Ambulatory Visit (INDEPENDENT_AMBULATORY_CARE_PROVIDER_SITE_OTHER): Payer: BC Managed Care – PPO | Admitting: Surgery

## 2012-05-07 ENCOUNTER — Encounter (INDEPENDENT_AMBULATORY_CARE_PROVIDER_SITE_OTHER): Payer: Self-pay | Admitting: Surgery

## 2012-05-07 VITALS — BP 128/70 | HR 92 | Temp 97.4°F | Resp 18 | Ht 76.0 in | Wt 257.0 lb

## 2012-05-07 DIAGNOSIS — K5732 Diverticulitis of large intestine without perforation or abscess without bleeding: Secondary | ICD-10-CM

## 2012-05-07 DIAGNOSIS — K5720 Diverticulitis of large intestine with perforation and abscess without bleeding: Secondary | ICD-10-CM

## 2012-05-07 MED ORDER — HYDROCODONE-ACETAMINOPHEN 5-500 MG PO TABS: 1.0000 | ORAL_TABLET | Freq: Four times a day (QID) | ORAL | Status: DC | PRN

## 2012-05-07 NOTE — Progress Notes (Signed)
S/p colostomy reversal.  The wound are healing well.  He has some exposed fascial sutures in the upper part of his midline incision.  The colostomy site is almost completely healed.  He has some fibrinous exudate in both parts of his open midline incision, but these are both cleaning up nicely with good granulation tissue.  Regular appetite and bowel movements.  He will purchase an abdominal binder and continue with dressing changes.  Follow-up in 2 weeks.  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  [name]. [name], MD, [facility]  05/07/2012 11:02 AM

## 2012-05-15 ENCOUNTER — Telehealth (INDEPENDENT_AMBULATORY_CARE_PROVIDER_SITE_OTHER): Payer: Self-pay | Admitting: General Surgery

## 2012-05-15 ENCOUNTER — Encounter (INDEPENDENT_AMBULATORY_CARE_PROVIDER_SITE_OTHER): Payer: Self-pay | Admitting: Surgery

## 2012-05-15 ENCOUNTER — Ambulatory Visit (INDEPENDENT_AMBULATORY_CARE_PROVIDER_SITE_OTHER): Payer: BC Managed Care – PPO | Admitting: Surgery

## 2012-05-15 VITALS — BP 128/82 | HR 86 | Temp 97.7°F | Resp 14 | Ht 76.0 in | Wt 258.8 lb

## 2012-05-15 DIAGNOSIS — K5720 Diverticulitis of large intestine with perforation and abscess without bleeding: Secondary | ICD-10-CM

## 2012-05-15 DIAGNOSIS — K5732 Diverticulitis of large intestine without perforation or abscess without bleeding: Secondary | ICD-10-CM

## 2012-05-15 MED ORDER — HYDROCODONE-ACETAMINOPHEN 5-500 MG PO TABS: 1.0000 | ORAL_TABLET | Freq: Four times a day (QID) | ORAL | Status: DC | PRN

## 2012-05-15 NOTE — Telephone Encounter (Signed)
Faxed over pt return back to work to the Attn: Camelia Eng, pt can return back to work on 05-24-12 per Dr [name] . Phone number to Camelia Eng is [phone] and fax number is [phone]

## 2012-05-15 NOTE — Progress Notes (Signed)
The patient needs to be examined one more time before returning to work on Sunday.  He is doing quite well.  Appetite and bowel movements are normal.  The wounds are getting much smaller and are all well-granulated.  Minimal soreness.  The ostomy site is about 1 cm deep, 1.5 x 3 cm in size.  Well-granulated, clean The midline incision is open at the top and at the bottom.  Both of these are granulating nicely with minimal drainage.  Continue with dressing changes.  Resume work on Sunday.  Follow-up 2-3 weeks for another wound check.  [name]. [name], MD, [facility]  05/15/2012 5:45 PM

## 2012-05-21 ENCOUNTER — Encounter (INDEPENDENT_AMBULATORY_CARE_PROVIDER_SITE_OTHER): Payer: BC Managed Care – PPO | Admitting: Surgery

## 2012-06-02 ENCOUNTER — Encounter (INDEPENDENT_AMBULATORY_CARE_PROVIDER_SITE_OTHER): Payer: Self-pay | Admitting: Surgery

## 2012-06-02 ENCOUNTER — Ambulatory Visit (INDEPENDENT_AMBULATORY_CARE_PROVIDER_SITE_OTHER): Payer: BC Managed Care – PPO | Admitting: Surgery

## 2012-06-02 VITALS — BP 162/98 | HR 82 | Temp 97.8°F | Ht 76.0 in | Wt 258.2 lb

## 2012-06-02 DIAGNOSIS — K5732 Diverticulitis of large intestine without perforation or abscess without bleeding: Secondary | ICD-10-CM

## 2012-06-02 DIAGNOSIS — K5720 Diverticulitis of large intestine with perforation and abscess without bleeding: Secondary | ICD-10-CM

## 2012-06-02 MED ORDER — HYDROCODONE-ACETAMINOPHEN 5-500 MG PO TABS: 1.0000 | ORAL_TABLET | Freq: Four times a day (QID) | ORAL | Status: DC | PRN

## 2012-06-02 NOTE — Progress Notes (Signed)
The patient comes in today for another wound check. He has returned to work. He is experiencing some soreness below his left lower quadrant colostomy site.  His colostomy site is completely healed. No sign of hernia in this area. I can palpate some firm scar tissue under the incision. His midline incision has 2 superficial areas that remained open. These are almost flush with the skin. Minimal drainage. No cellulitis. He can treat these with Neosporin and dry dressings until completely healed. Appetite and bowel movements are normal. I refilled his Vicodin one last time as he is still having some abdominal soreness especially since returning to work.  Follow-up 2-3 weeks.  [name]. [name], MD, [facility]  06/02/2012 10:02 AM

## 2012-06-16 ENCOUNTER — Emergency Department (HOSPITAL_COMMUNITY): Payer: BC Managed Care – PPO

## 2012-06-16 ENCOUNTER — Encounter: Payer: Self-pay | Admitting: Internal Medicine

## 2012-06-16 ENCOUNTER — Observation Stay (HOSPITAL_COMMUNITY)
Admission: EM | Admit: 2012-06-16 | Discharge: 2012-06-17 | DRG: 188 | Disposition: A | Discharge status: Home / Self Care | Payer: BC Managed Care – PPO | Attending: General Surgery | Admitting: General Surgery

## 2012-06-16 ENCOUNTER — Encounter (HOSPITAL_COMMUNITY): Payer: Self-pay | Admitting: *Deleted

## 2012-06-16 ENCOUNTER — Other Ambulatory Visit (HOSPITAL_COMMUNITY): Payer: BC Managed Care – PPO

## 2012-06-16 DIAGNOSIS — K639 Disease of intestine, unspecified: Principal | ICD-10-CM | POA: Insufficient documentation

## 2012-06-16 DIAGNOSIS — I10 Essential (primary) hypertension: Secondary | ICD-10-CM | POA: Insufficient documentation

## 2012-06-16 DIAGNOSIS — K651 Peritoneal abscess: Secondary | ICD-10-CM

## 2012-06-16 DIAGNOSIS — L988 Other specified disorders of the skin and subcutaneous tissue: Secondary | ICD-10-CM

## 2012-06-16 DIAGNOSIS — G4730 Sleep apnea, unspecified: Secondary | ICD-10-CM | POA: Insufficient documentation

## 2012-06-16 DIAGNOSIS — R109 Unspecified abdominal pain: Secondary | ICD-10-CM | POA: Insufficient documentation

## 2012-06-16 DIAGNOSIS — K219 Gastro-esophageal reflux disease without esophagitis: Secondary | ICD-10-CM | POA: Insufficient documentation

## 2012-06-16 DIAGNOSIS — Z9049 Acquired absence of other specified parts of digestive tract: Secondary | ICD-10-CM | POA: Insufficient documentation

## 2012-06-16 LAB — BASIC METABOLIC PANEL
BUN: 16 mg/dL (ref 6–23)
Calcium: 9.9 mg/dL (ref 8.4–10.5)
Creatinine, Ser: 0.95 mg/dL (ref 0.50–1.35)
GFR calc Af Amer: >90 mL/min (ref >90)
GFR calc non Af Amer: >90 mL/min (ref >90)
Glucose, Bld: 95 mg/dL (ref 70–99)
Potassium: 4.2 mEq/L (ref 3.5–5.1)

## 2012-06-16 LAB — URINALYSIS, ROUTINE W REFLEX MICROSCOPIC
Bilirubin Urine: NEGATIVE
Hgb urine dipstick: NEGATIVE
Ketones, ur: NEGATIVE mg/dL
Nitrite: NEGATIVE
Protein, ur: NEGATIVE mg/dL
Specific Gravity, Urine: 1.024 (ref 1.005–1.030)
Urobilinogen, UA: 0.2 mg/dL (ref 0.0–1.0)

## 2012-06-16 LAB — CBC WITH DIFFERENTIAL/PLATELET
Basophils Relative: 1 % (ref 0–1)
Eosinophils Absolute: 0.2 10*3/uL (ref 0.0–0.7)
Eosinophils Relative: 4 % (ref 0–5)
Hemoglobin: 14.2 g/dL (ref 13.0–17.0)
Lymphs Abs: 1.6 10*3/uL (ref 0.7–4.0)
MCH: 28.1 pg (ref 26.0–34.0)
MCHC: 33.7 g/dL (ref 30.0–36.0)
MCV: 83.2 fL (ref 78.0–100.0)
Monocytes Relative: 10 % (ref 3–12)
Neutrophils Relative %: 58 % (ref 43–77)

## 2012-06-16 MED ORDER — DIPHENHYDRAMINE HCL 50 MG/ML IJ SOLN: 12.5000 mg | Freq: Four times a day (QID) | INTRAMUSCULAR | Status: DC | PRN

## 2012-06-16 MED ORDER — IOHEXOL 300 MG/ML  SOLN
100.0000 mL | Freq: Once | INTRAMUSCULAR | Status: AC | PRN
  Administered 2012-06-16: 100 mL via INTRAVENOUS

## 2012-06-16 MED ORDER — IOHEXOL 300 MG/ML  SOLN: 100.0000 mL | Freq: Once | INTRAMUSCULAR | Status: AC | PRN

## 2012-06-16 MED ORDER — POTASSIUM CHLORIDE IN NACL 20-0.9 MEQ/L-% IV SOLN
INTRAVENOUS | Status: DC
  Administered 2012-06-16: 17:00:00 via INTRAVENOUS
  Filled 2012-06-16 (×4): qty 1000

## 2012-06-16 MED ORDER — ACETAMINOPHEN 650 MG RE SUPP: 650.0000 mg | Freq: Four times a day (QID) | RECTAL | Status: DC | PRN

## 2012-06-16 MED ORDER — SODIUM CHLORIDE 0.9 % IV BOLUS (SEPSIS)
1000.0000 mL | Freq: Once | INTRAVENOUS | Status: AC
  Administered 2012-06-16: 1000 mL via INTRAVENOUS

## 2012-06-16 MED ORDER — HYDROMORPHONE HCL PF 1 MG/ML IJ SOLN
1.0000 mg | INTRAMUSCULAR | Status: DC | PRN
  Administered 2012-06-16: 1 mg via INTRAVENOUS
  Filled 2012-06-16: qty 1

## 2012-06-16 MED ORDER — ACETAMINOPHEN 325 MG PO TABS: 650.0000 mg | ORAL_TABLET | Freq: Four times a day (QID) | ORAL | Status: DC | PRN

## 2012-06-16 MED ORDER — PIPERACILLIN-TAZOBACTAM 3.375 G IVPB
3.3750 g | Freq: Three times a day (TID) | INTRAVENOUS | Status: DC
  Administered 2012-06-16 – 2012-06-17 (×3): 3.375 g via INTRAVENOUS
  Filled 2012-06-16 (×5): qty 50

## 2012-06-16 MED ORDER — ONDANSETRON HCL 4 MG/2ML IJ SOLN
4.0000 mg | Freq: Four times a day (QID) | INTRAMUSCULAR | Status: DC | PRN
  Administered 2012-06-16: 4 mg via INTRAVENOUS
  Filled 2012-06-16: qty 2

## 2012-06-16 MED ORDER — HYDROCODONE-ACETAMINOPHEN 5-325 MG PO TABS: 1.0000 | ORAL_TABLET | ORAL | Status: DC | PRN

## 2012-06-16 MED ORDER — DIPHENHYDRAMINE HCL 12.5 MG/5ML PO ELIX: 12.5000 mg | ORAL_SOLUTION | Freq: Four times a day (QID) | ORAL | Status: DC | PRN

## 2012-06-16 MED ORDER — PANTOPRAZOLE SODIUM 40 MG IV SOLR
40.0000 mg | Freq: Every day | INTRAVENOUS | Status: DC
  Administered 2012-06-17: 40 mg via INTRAVENOUS
  Filled 2012-06-16 (×2): qty 40

## 2012-06-16 NOTE — ED Notes (Signed)
Pt reports LLQ abd pain down to R groin x3-4days. Hx perforation and colostomy reversal in Feb and May this year. Scarring noted to abd. Small open area with yellow drainage noted to periumbilical area. Denies n/v.

## 2012-06-16 NOTE — ED Provider Notes (Signed)
History     CSN: [id number]  Arrival date & time 06/16/12  1056   First MD Initiated Contact with Patient 06/16/12 1202      Chief Complaint  Patient presents with  . Abdominal Pain    (Consider location/radiation/quality/duration/timing/severity/associated sxs/prior treatment) HPI Comments: Pt. S/P colectomy repair in May 2013 presents with cc LLQ abdominal pain and left testicular pain.  LLQ pain is chronic and has been followed by Dr. Marcille Blanco .  Patient states that pain has been increasing since Friday . It is worsened with standing, coughing or straining.  Patient reports taking Vicodin 5/500, which helped but is out of medication.  He states that over the weekend he began having pain in his lefty testicle and that it swelled up like a balloon, but has resolved.  There is still some pain in the testicle, and was checked previously by Dr. [name] for Hernia.  Patient denies, fevers, chills, nausea, vomiting, or changes in bowel habit. Last BM was yesterday. Patient denies hematochezia. Also denies dysuria, penile discharge, heat or redness of the testicle or surrounding groin.  Denies discharge from surgical wound sites.  Patient is a 46 y.o. male presenting with abdominal pain. The history is provided by the patient.  Abdominal Pain The primary symptoms of the illness include abdominal pain. The primary symptoms of the illness do not include fever, nausea, vomiting or dysuria.    Past Medical History  Diagnosis Date  . Thyroid disease   . Hyperthyroidism   . Depression   . Diverticulitis   . Pneumonia   . GERD (gastroesophageal reflux disease)   . Arthritis   . Hypertension     EKG 01/16/12 EPIC  . Sleep apnea     does not wear cpap/states MILD- no sleep study in years    Past Surgical History  Procedure Date  . Knee surgery   . Shoulder surgery   . Colostomy revision 01/06/2012    Procedure: COLON RESECTION SIGMOID;  Surgeon: [name]. [name], MD;  Location: [facility];   Service: General;  Laterality: N/A;  with hartmans procedure  . Ankle surgery     arthroscopy right with excision  and scrapping of bone  . Colon surgery 01/2012  . Colostomy closure 04/13/2012    Procedure: COLOSTOMY CLOSURE;  Surgeon: [name]. [name], MD;  Location: [facility];  Service: General;  Laterality: N/A;    No family history on file.  History  Substance Use Topics  . Smoking status: Never Smoker   . Smokeless tobacco: Never Used  . Alcohol Use: No      Review of Systems  Constitutional: Negative for fever.  Gastrointestinal: Positive for abdominal pain. Negative for nausea, vomiting and abdominal distention.  Genitourinary: Positive for scrotal swelling. Negative for dysuria, flank pain, discharge, penile swelling and penile pain.    Allergies  Sulfa antibiotics  Home Medications   Current Outpatient Rx  Name Route Sig Dispense Refill  . HYDROCODONE-ACETAMINOPHEN 5-500 MG PO TABS Oral Take 1 tablet by mouth every 6 (six) hours as needed. For pain    . LEVOTHYROXINE SODIUM 75 MCG PO TABS Oral Take 75 mcg by mouth daily with supper.     Marland Kitchen LOSARTAN POTASSIUM 100 MG PO TABS  100 mg daily with supper.     . MELOXICAM 15 MG PO TABS Oral Take 7.5 mg by mouth daily.     . QUINAPRIL HCL 20 MG PO TABS Oral Take 20 mg by mouth daily.     Marland Kitchen  MEDERMA EX GEL Topical Apply 1 application topically 3 (three) times daily as needed. To scar on the stomach      BP 136/90  Pulse 76  Temp 98.6 F (37 C) (Oral)  Resp 18  SpO2 98%  Physical Exam  Constitutional: He appears well-developed and well-nourished. No distress.  HENT:  Head: Normocephalic and atraumatic.  Eyes: Conjunctivae are normal. Right eye exhibits no discharge. Left eye exhibits no discharge. No scleral icterus.  Cardiovascular: Normal rate, regular rhythm and normal heart sounds.  Exam reveals no gallop and no friction rub.   No murmur heard. Pulmonary/Chest: Effort normal and breath sounds normal. No respiratory  distress. He has no wheezes. He has no rales.  Abdominal: Soft. Bowel sounds are normal. He exhibits no distension and no mass. There is tenderness. There is no guarding (tenderness LLQ ).       See skin exam  Genitourinary: Penis normal.       No swelling, redness, of scrotum. No hernia palpated.  No tenderness of testicle or spermatic cord. No discharge noted.  Skin: Skin is warm and dry. He is not diaphoretic.       Multiple well healed scars on the abdomen. Two well healing surgical sites superior and inferior to the umbilicus. Site inferior to the umbilicus appears to have some macerated eschar with serous discharge. No signs of infection.     ED Course  Procedures (including critical care time)  Results for orders placed during the hospital encounter of 06/16/12  CBC WITH DIFFERENTIAL      Component Value Range   WBC 5.6  4.0 - 10.5 K/uL   RBC 5.06  4.22 - 5.81 MIL/uL   Hemoglobin 14.2  13.0 - 17.0 g/dL   HCT 16.1  09.6 - 04.5 %   MCV 83.2  78.0 - 100.0 fL   MCH 28.1  26.0 - 34.0 pg   MCHC 33.7  30.0 - 36.0 g/dL   RDW 40.9  81.1 - 91.4 %   Platelets 202  150 - 400 K/uL   Neutrophils Relative 58  43 - 77 %   Neutro Abs 3.2  1.7 - 7.7 K/uL   Lymphocytes Relative 28  12 - 46 %   Lymphs Abs 1.6  0.7 - 4.0 K/uL   Monocytes Relative 10  3 - 12 %   Monocytes Absolute 0.6  0.1 - 1.0 K/uL   Eosinophils Relative 4  0 - 5 %   Eosinophils Absolute 0.2  0.0 - 0.7 K/uL   Basophils Relative 1  0 - 1 %   Basophils Absolute 0.0  0.0 - 0.1 K/uL  BASIC METABOLIC PANEL      Component Value Range   Sodium 139  135 - 145 mEq/L   Potassium 4.2  3.5 - 5.1 mEq/L   Chloride 104  96 - 112 mEq/L   CO2 27  19 - 32 mEq/L   Glucose, Bld 95  70 - 99 mg/dL   BUN 16  6 - 23 mg/dL   Creatinine, Ser 7.82  0.50 - 1.35 mg/dL   Calcium 9.9  8.4 - 95.6 mg/dL   GFR calc non Af Amer >90  >90 mL/min   GFR calc Af Amer >90  >90 mL/min  URINALYSIS, ROUTINE W REFLEX MICROSCOPIC      Component Value Range    Color, Urine YELLOW  YELLOW   APPearance CLEAR  CLEAR   Specific Gravity, Urine 1.024  1.005 - 1.030  pH 6.0  5.0 - 8.0   Glucose, UA NEGATIVE  NEGATIVE mg/dL   Hgb urine dipstick NEGATIVE  NEGATIVE   Bilirubin Urine NEGATIVE  NEGATIVE   Ketones, ur NEGATIVE  NEGATIVE mg/dL   Protein, ur NEGATIVE  NEGATIVE mg/dL   Urobilinogen, UA 0.2  0.0 - 1.0 mg/dL   Nitrite NEGATIVE  NEGATIVE   Leukocytes, UA NEGATIVE  NEGATIVE     No results found.   No diagnosis found.    MDM  Patients labs all WNL. 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  Dr. Hyman Hopes paged surgery to see patient. 4:12 PM         Arthor Captain, PA-C 06/16/12 1726

## 2012-06-16 NOTE — ED Notes (Signed)
Pt states pain is sharp and progressive x2 days. Pain radiates LLQ to groin. Increases with movement, sneezing/coughing. Denies use of pain mediation

## 2012-06-16 NOTE — H&P (Signed)
Douglas Wiggins is an 46 y.o. male.   Chief Complaint: Abdominal pain HPI: Patient presents to [name] with c/o of abdominal pain which has been getting progressivly worse over the past week or so according to the patient. He had a colostomy closure done this past May by Dr. [name] having previously undergone a exploratory laparotomy with sigmoid colectomy and descending colostomy (Hartman's procedure) in February of this year. He states that he has had chronic pain post surgery, but that this was different from his usual discomfort level. He denies any fever or chills, N/V, diarrhea, change in caliber or volume of stool, or blood in his stool. He has been able to eat without s/s of bloating or nausea; he is able to pass flatus.. Denies any recent sick contact. He has not eaten since last night.   Past Medical History  Diagnosis Date  . Thyroid disease   . Hyperthyroidism   . Depression   . Diverticulitis   . Pneumonia   . GERD (gastroesophageal reflux disease)   . Arthritis   . Hypertension     EKG 01/16/12 EPIC  . Sleep apnea     does not wear cpap/states MILD- no sleep study in years    Past Surgical History  Procedure Date  . Knee surgery   . Shoulder surgery   . Colostomy revision 01/06/2012    Procedure: COLON RESECTION SIGMOID;  Surgeon: [name]. [name], MD;  Location: [facility];  Service: General;  Laterality: N/A;  with hartmans procedure  . Ankle surgery     arthroscopy right with excision  and scrapping of bone  . Colon surgery 01/2012  . Colostomy closure 04/13/2012    Procedure: COLOSTOMY CLOSURE;  Surgeon: [name]. [name], MD;  Location: [facility];  Service: General;  Laterality: N/A;    No family history on file. Social History:  reports that he has never smoked. He has never used smokeless tobacco. He reports that he does not drink alcohol or use illicit drugs.  Allergies:  Allergies  Allergen Reactions  . Sulfa Antibiotics Hives and Rash     (Not in a hospital  admission)  Results for orders placed during the hospital encounter of 06/16/12 (from the past 48 hour(s))  CBC WITH DIFFERENTIAL     Status: Normal   Collection Time   06/16/12  1:00 PM      Component Value Range Comment   WBC 5.6  4.0 - 10.5 K/uL    RBC 5.06  4.22 - 5.81 MIL/uL    Hemoglobin 14.2  13.0 - 17.0 g/dL    HCT 16.1  09.6 - 04.5 %    MCV 83.2  78.0 - 100.0 fL    MCH 28.1  26.0 - 34.0 pg    MCHC 33.7  30.0 - 36.0 g/dL    RDW 40.9  81.1 - 91.4 %    Platelets 202  150 - 400 K/uL    Neutrophils Relative 58  43 - 77 %    Neutro Abs 3.2  1.7 - 7.7 K/uL    Lymphocytes Relative 28  12 - 46 %    Lymphs Abs 1.6  0.7 - 4.0 K/uL    Monocytes Relative 10  3 - 12 %    Monocytes Absolute 0.6  0.1 - 1.0 K/uL    Eosinophils Relative 4  0 - 5 %    Eosinophils Absolute 0.2  0.0 - 0.7 K/uL    Basophils Relative 1  0 - 1 %  Basophils Absolute 0.0  0.0 - 0.1 K/uL   BASIC METABOLIC PANEL     Status: Normal   Collection Time   06/16/12  1:00 PM      Component Value Range Comment   Sodium 139  135 - 145 mEq/L    Potassium 4.2  3.5 - 5.1 mEq/L    Chloride 104  96 - 112 mEq/L    CO2 27  19 - 32 mEq/L    Glucose, Bld 95  70 - 99 mg/dL    BUN 16  6 - 23 mg/dL    Creatinine, Ser 6.21  0.50 - 1.35 mg/dL    Calcium 9.9  8.4 - 30.8 mg/dL    GFR calc non Af Amer >90  >90 mL/min    GFR calc Af Amer >90  >90 mL/min   URINALYSIS, ROUTINE W REFLEX MICROSCOPIC     Status: Normal   Collection Time   06/16/12  2:53 PM      Component Value Range Comment   Color, Urine YELLOW  YELLOW    APPearance CLEAR  CLEAR    Specific Gravity, Urine 1.024  1.005 - 1.030    pH 6.0  5.0 - 8.0    Glucose, UA NEGATIVE  NEGATIVE mg/dL    Hgb urine dipstick NEGATIVE  NEGATIVE    Bilirubin Urine NEGATIVE  NEGATIVE    Ketones, ur NEGATIVE  NEGATIVE mg/dL    Protein, ur NEGATIVE  NEGATIVE mg/dL    Urobilinogen, UA 0.2  0.0 - 1.0 mg/dL    Nitrite NEGATIVE  NEGATIVE    Leukocytes, UA NEGATIVE  NEGATIVE MICROSCOPIC  NOT DONE ON URINES WITH NEGATIVE PROTEIN, BLOOD, LEUKOCYTES, NITRITE, OR GLUCOSE <1000 mg/dL.   Ct Abdomen Pelvis W Contrast  06/16/2012  *RADIOLOGY REPORT*  Clinical Data: Left lower quadrant pain post colostomy reversal.  CT ABDOMEN AND PELVIS WITH CONTRAST  Technique:  Multidetector CT imaging of the abdomen and pelvis was performed following the standard protocol during bolus administration of intravenous contrast.  Contrast: OMNIPAQUE IOHEXOL 300 MG/ML  SOLN, 1 OMNIPAQUE IOHEXOL 300 MG/ML  SOLN  Comparison: 01/05/2012.  Findings: Diffuse inflammatory process surrounds the sigmoid colon at the expected level of the reanastomosis.  Fluid and air containing collection deep to and appearing to involve the left rectus muscles consistent with abscess with fistula to the colonic anastomotic site.  Fatty liver.  No focal hepatic, splenic, pancreatic, left renal or adrenal lesion.  Sub centimeter right upper pole low density structure possibly a cyst although too small to adequately characterize.  Small calcification left common iliac artery.  No abdominal aortic aneurysm.  No calcified gallstones.  Degenerative changes L1-2.  No bony destructive lesion.  IMPRESSION: Diffuse inflammatory process surrounds the sigmoid colon at the expected level of the reanastomosis.  Fluid and air containing collection deep to and appearing to involve the left rectus muscles consistent with abscess with fistula to the colonic anastomotic site.  Critical Value/emergent results were called by telephone at the time of interpretation on 06/16/2012 at 3:40 p.m. to [facility] physician's assistant, who verbally acknowledged these results.  Original Report Authenticated By: Fuller Canada, M.D.    Review of Systems  Constitutional: Negative for fever, chills, weight loss, malaise/fatigue and diaphoresis.  HENT: Negative.   Eyes: Negative.   Respiratory: Negative.   Cardiovascular: Negative.   Gastrointestinal: Positive for  abdominal pain. Negative for heartburn, nausea, vomiting, diarrhea, constipation, blood in stool and melena.  Genitourinary: Negative.   Musculoskeletal:  Negative.   Skin: Negative.   Neurological: Negative.  Negative for weakness.  Endo/Heme/Allergies: Negative.   Psychiatric/Behavioral: Negative.     Blood pressure 136/90, pulse 76, temperature 98.6 F (37 C), temperature source Oral, resp. rate 18, SpO2 98.00%. Physical Exam  Constitutional: He is oriented to person, place, and time. He appears well-developed and well-nourished. No distress.  HENT:  Head: Normocephalic and atraumatic.  Eyes: Conjunctivae are normal. Pupils are equal, round, and reactive to light. Right eye exhibits no discharge. Left eye exhibits no discharge. No scleral icterus.  Neck: Normal range of motion. Neck supple. No JVD present. No tracheal deviation present. No thyromegaly present.  GI: Soft. Bowel sounds are normal. He exhibits no distension and no mass. There is tenderness. There is no rebound and no guarding.  Musculoskeletal: Normal range of motion. He exhibits no edema and no tenderness.  Lymphadenopathy:    He has no cervical adenopathy.  Neurological: He is alert and oriented to person, place, and time.  Skin: Skin is dry. No rash noted. He is not diaphoretic. No erythema. No pallor.  Psychiatric: He has a normal mood and affect.     Assessment/Plan 1. Colonic fistula with fluid collection.  Plan:  1. Admit to medsurg floor 2. NPO 3. IVF 4. Pain mangement 5. DVT prophylaxis 4. Labs in am 5. IR consult (to see if this can be drained). 6.? PICC line ? Need for TPN if remains NPO for lengthy period.  Meyer Dockery 06/16/2012, 4:20 PM

## 2012-06-16 NOTE — ED Notes (Signed)
Surgeon states that pt will be admitted to have interventional radiology drain abscess tomorrow am.  Pt to be NPO until then

## 2012-06-17 ENCOUNTER — Inpatient Hospital Stay (HOSPITAL_COMMUNITY): Payer: BC Managed Care – PPO

## 2012-06-17 ENCOUNTER — Encounter (INDEPENDENT_AMBULATORY_CARE_PROVIDER_SITE_OTHER): Payer: Self-pay | Admitting: General Surgery

## 2012-06-17 LAB — PROTIME-INR: INR: 1.03 (ref 0.00–1.49)

## 2012-06-17 LAB — BASIC METABOLIC PANEL
CO2: 26 mEq/L (ref 19–32)
Calcium: 9.1 mg/dL (ref 8.4–10.5)
Chloride: 103 mEq/L (ref 96–112)
Potassium: 3.9 mEq/L (ref 3.5–5.1)
Sodium: 138 mEq/L (ref 135–145)

## 2012-06-17 LAB — CBC
Hemoglobin: 13.2 g/dL (ref 13.0–17.0)
MCV: 84.7 fL (ref 78.0–100.0)
Platelets: 198 10*3/uL (ref 150–400)
RBC: 4.78 MIL/uL (ref 4.22–5.81)
WBC: 5.3 10*3/uL (ref 4.0–10.5)

## 2012-06-17 MED ORDER — HYDROCODONE-ACETAMINOPHEN 5-325 MG PO TABS: 1.0000 | ORAL_TABLET | ORAL | Status: AC | PRN

## 2012-06-17 MED ORDER — IOHEXOL 300 MG/ML  SOLN: 450.0000 mL | Freq: Once | INTRAMUSCULAR | Status: DC | PRN

## 2012-06-17 NOTE — ED Provider Notes (Signed)
Medical screening examination/treatment/procedure(s) were conducted as a shared visit with non-physician practitioner(s) and myself.  I personally evaluated the patient during the encounter  Recent colon re-anastomosis pw worsening LLQ abdominal pain. No r/g. Found to have abscess and fistula. Needs IV abx and drainage. Admitted to gen surg.  Forbes Cellar, MD 06/17/12 [phone]

## 2012-06-17 NOTE — Progress Notes (Signed)
Discharge instructions reviewed with patient and pt's mother, vital signs are stable, pain at a minimal pt didn't want pain medication, pt to follow up with MD Tsuei Means, Myrtie Hawk RN 06/17/12 13:41pm

## 2012-06-17 NOTE — Discharge Summary (Signed)
Physician Discharge Summary  Patient ID: Douglas Wiggins MRN: [id number] DOB/AGE: 1966/06/08 46 y.o.  Admit date: 06/16/2012 Discharge date: 06/17/2012  Admission Diagnoses: abdominal pain  Discharge Diagnoses: Inflammation around anastomosis Active Problems:  * No active hospital problems. *    Discharged Condition: stable  Hospital Course: [facility] with c/o of abdominal pain which had been getting progressivly worse over the past week or so according to the patient. He had a colostomy closure done this past May by Dr. [name] having previously undergone a exploratory laparotomy with sigmoid colectomy and descending colostomy (Hartman's procedure) in February of this year. He states that he has had chronic pain post surgery, but that this was different from his usual discomfort level. He denies any fever or chills, N/V, diarrhea, change in caliber or volume of stool, or blood in his stool. He has been able to eat without s/s of bloating or nausea; he is able to pass flatus.. Denied any recent sick contact. He has not eaten since last night. Patient was seen by IR to determine if there was an area of fluid collection to be drained, this turned out to not be the case in their opinion, he then had a contrast barium enema done;which demonstrated some mucosal edema at the site of the reanastomosis but no  leak or fistula is seen. He was treated empirically with IVF, Abx and pain medications. And kept NPO.  Consults:none   Significant Diagnostic Studies: labs and radiology. Treatments: IV hydration, antibiotics and analgesia.  Discharge Exam: Blood pressure 121/75, pulse 55, temperature 98.4 F (36.9 C), temperature source Oral, resp. rate 18, height 6\' 4"  (1.93 m), weight 262 lb 12.6 oz (119.2 kg), SpO2 97.00%. General appearance: alert, cooperative, appears stated age, no distress and mildly obese Chest: CTA Abdomen is soft, non tender, +Bs,BM,flatus. Extremities: no  edema     Disposition: 01-Home or Self Care  Patient to keep previously scheduled appointment with Dr. [name] on the 24th of this month.  Discharge Orders    Future Appointments: Provider: Department: Dept Phone: Center:   06/24/2012 9:40 AM [name]. Tsuei, MD [facility] [phone] None     Medication List  As of 06/17/2012 12:32 PM   ASK your doctor about these medications         HYDROcodone-acetaminophen 5-500 MG per tablet   Commonly known as: VICODIN   Take 1 tablet by mouth every 6 (six) hours as needed. For pain      levothyroxine 75 MCG tablet   Commonly known as: SYNTHROID, LEVOTHROID   Take 75 mcg by mouth daily with supper.      losartan 100 MG tablet   Commonly known as: COZAAR   100 mg daily with supper.      Mederma Gel   Apply 1 application topically 3 (three) times daily as needed. To scar on the stomach      meloxicam 15 MG tablet   Commonly known as: MOBIC   Take 7.5 mg by mouth daily.      quinapril 20 MG tablet   Commonly known as: ACCUPRIL   Take 20 mg by mouth daily.             SignedBlenda Mounts 06/17/2012, 12:32 PM   Contrast enema was negative for leak.  Inflammation and pain noted on admit CT and exam.  Will treat empirically with Augmentin 875 BID and Vicodin.  Followup with Dr. [name] on July 24 (1 week)

## 2012-06-18 NOTE — Care Management Note (Signed)
    Page 1 of 1   06/18/2012     10:30:46 AM   CARE MANAGEMENT NOTE 06/18/2012  Patient:  Douglas Wiggins, Douglas Wiggins   Account Number:  [id number]  Date Initiated:  06/17/2012  Documentation initiated by:  Lorenda Ishihara  Subjective/Objective Assessment:   46 yo male admitted with abd pain, possible abscess, IR to drain. PTA lived at home with family     Action/Plan:   Follow of d/c needs   Anticipated DC Date:  06/20/2012   Anticipated DC Plan:  HOME/SELF CARE      DC Planning Services  CM consult      Choice offered to / List presented to:             Status of service:  Completed, signed off Medicare Important Message given?   (If response is "NO", the following Medicare IM given date fields will be blank) Date Medicare IM given:   Date Additional Medicare IM given:    Discharge Disposition:  HOME/SELF CARE  Per UR Regulation:  Reviewed for med. necessity/level of care/duration of stay  If discussed at Long Length of Stay Meetings, dates discussed:    Comments:

## 2012-06-24 ENCOUNTER — Encounter (INDEPENDENT_AMBULATORY_CARE_PROVIDER_SITE_OTHER): Payer: BC Managed Care – PPO | Admitting: Surgery

## 2012-07-01 ENCOUNTER — Telehealth (INDEPENDENT_AMBULATORY_CARE_PROVIDER_SITE_OTHER): Payer: Self-pay | Admitting: General Surgery

## 2012-07-01 NOTE — Telephone Encounter (Signed)
Dr. Alvia Grove Hydrocodone 5/325 mg, # 20, 1 po Q 4-6 H prn pain , no refill and called to [facility]:  [phone].  Pt is aware.

## 2012-07-01 NOTE — Telephone Encounter (Signed)
Pt's mother called to ask for one more refill of pain med (Vicodin.)  He was seen in ER on 06/16/12 for abscess and given antibx (finished) and Vicodin.  He is working, but still has some pain.  Follow-up appt is 07/10/12 with MT.  Please advise.

## 2012-07-10 ENCOUNTER — Encounter (INDEPENDENT_AMBULATORY_CARE_PROVIDER_SITE_OTHER): Payer: Self-pay | Admitting: Surgery

## 2012-07-10 ENCOUNTER — Ambulatory Visit (INDEPENDENT_AMBULATORY_CARE_PROVIDER_SITE_OTHER): Payer: BC Managed Care – PPO | Admitting: Surgery

## 2012-07-10 VITALS — BP 133/86 | HR 88 | Temp 97.4°F | Resp 18 | Ht 76.0 in | Wt 259.4 lb

## 2012-07-10 DIAGNOSIS — K5732 Diverticulitis of large intestine without perforation or abscess without bleeding: Secondary | ICD-10-CM

## 2012-07-10 DIAGNOSIS — K5720 Diverticulitis of large intestine with perforation and abscess without bleeding: Secondary | ICD-10-CM

## 2012-07-10 MED ORDER — AMOXICILLIN-POT CLAVULANATE 875-125 MG PO TABS: 1.0000 | ORAL_TABLET | Freq: Two times a day (BID) | ORAL | Status: AC

## 2012-07-10 MED ORDER — OXYCODONE-ACETAMINOPHEN 5-325 MG PO TABS: 1.0000 | ORAL_TABLET | ORAL | Status: AC | PRN

## 2012-07-10 NOTE — Progress Notes (Signed)
The patient was hospitalized three weeks ago with LLQ pain.  He was found to have some inflammation just below his old colostomy site.  Contrast enema showed no sign of leak at his anastomosis.  He was discharged on Augmentin and PO pain meds.  He describes normal bowel movements and appetite.  He reports pain after standing for several hours at work and with certain movements.  He denies any fever.  The pain is concentrated at his LLQ ostomy site.  Filed Vitals:   07/10/12 0943  BP: 133/86  Pulse: 88  Temp: 97.4 F (36.3 C)  Resp: 18   WDWN in NAD Abd - soft, active bowel sounds His midline incision is almost completely healed.  He has a small opening where the tails of the PDS suture have protruded through the skin.  We trimmed the suture and covered it with a dressing.  He has some firm scar tissue under his colostomy site that it tender to deep palpation.  No sign of hernia.  No peritoneal signs.  Imp:  Deep muscular/ subcutaneous inflammation.  No sign of leak or fistula.  Continue antibiotics and PRN pain meds.  Follow-up 3 weeks.  [name]. [name], MD, [facility]  07/10/2012 12:14 PM

## 2012-07-28 ENCOUNTER — Telehealth (INDEPENDENT_AMBULATORY_CARE_PROVIDER_SITE_OTHER): Payer: Self-pay | Admitting: General Surgery

## 2012-07-28 NOTE — Telephone Encounter (Signed)
Pt called for refill of pain meds.  Paged and updated Dr. [name], who authorized additional Percocet.  Percocet 5/325 mg, # 30, 1 po Q4H prn, no refill signed in the office by Dr. Maisie Fus.  Pt notified to pick up the script from the front desk.

## 2012-08-04 ENCOUNTER — Encounter (INDEPENDENT_AMBULATORY_CARE_PROVIDER_SITE_OTHER): Payer: BC Managed Care – PPO | Admitting: Surgery

## 2012-08-13 ENCOUNTER — Encounter (INDEPENDENT_AMBULATORY_CARE_PROVIDER_SITE_OTHER): Payer: Self-pay | Admitting: Surgery

## 2012-08-13 ENCOUNTER — Ambulatory Visit (INDEPENDENT_AMBULATORY_CARE_PROVIDER_SITE_OTHER): Payer: BC Managed Care – PPO | Admitting: Surgery

## 2012-08-13 VITALS — BP 156/100 | HR 58 | Temp 97.2°F | Ht 76.0 in | Wt 266.2 lb

## 2012-08-13 DIAGNOSIS — K439 Ventral hernia without obstruction or gangrene: Secondary | ICD-10-CM

## 2012-08-13 DIAGNOSIS — K5720 Diverticulitis of large intestine with perforation and abscess without bleeding: Secondary | ICD-10-CM

## 2012-08-13 DIAGNOSIS — K5732 Diverticulitis of large intestine without perforation or abscess without bleeding: Secondary | ICD-10-CM

## 2012-08-13 MED ORDER — TRAMADOL HCL 50 MG PO TABS: 50.0000 mg | ORAL_TABLET | Freq: Four times a day (QID) | ORAL | Status: DC | PRN

## 2012-08-13 MED ORDER — OXYCODONE-ACETAMINOPHEN 5-325 MG PO TABS: 1.0000 | ORAL_TABLET | Freq: Four times a day (QID) | ORAL | Status: DC | PRN

## 2012-08-13 NOTE — Progress Notes (Signed)
Patient ID: Douglas Wiggins, male   DOB: [birth year], 46 y.o.   MRN: [id number]  The patient returns for reevaluation. He still has some soreness from his old colostomy site radiating down towards his groin. No sign of any right-sided abdominal pain. He has developed a midline ventral hernia. This is easily reducible. His abdomen is nondistended. Active bowel sounds. He is having regular bowel movements. No sign of bleeding with bowel movements. I think that most of his left lower quadrant muscular pain is due to the resolving inflammation around the colostomy site as well as the tension from the midline ventral hernia. We will try using Aleve as well as Ultram alternating with Percocet to manage his discomfort. I'll reevaluate him in one month and we will need to plan an open ventral hernia repair and the next several months.  Filed Vitals:   08/13/12 1603  BP: 156/100  Pulse: 58  Temp: 97.2 F (36.2 C)     Eydan Chianese K. [name], MD, [facility]  08/13/2012 5:16 PM

## 2012-09-11 ENCOUNTER — Encounter (INDEPENDENT_AMBULATORY_CARE_PROVIDER_SITE_OTHER): Payer: BC Managed Care – PPO | Admitting: Surgery

## 2012-09-17 ENCOUNTER — Ambulatory Visit (INDEPENDENT_AMBULATORY_CARE_PROVIDER_SITE_OTHER): Payer: BC Managed Care – PPO | Admitting: Surgery

## 2012-09-17 ENCOUNTER — Encounter (INDEPENDENT_AMBULATORY_CARE_PROVIDER_SITE_OTHER): Payer: Self-pay | Admitting: Surgery

## 2012-09-17 VITALS — BP 124/80 | HR 86 | Temp 97.0°F | Resp 18 | Ht 76.0 in | Wt 263.0 lb

## 2012-09-17 DIAGNOSIS — K439 Ventral hernia without obstruction or gangrene: Secondary | ICD-10-CM

## 2012-09-17 DIAGNOSIS — K4090 Unilateral inguinal hernia, without obstruction or gangrene, not specified as recurrent: Secondary | ICD-10-CM

## 2012-09-17 NOTE — Progress Notes (Signed)
Patient ID: Douglas Wiggins, male   DOB: 03/02/1966, 46 y.o.   MRN: [id number]  Chief Complaint  Patient presents with  . Follow-up    HPI Douglas Wiggins is a 46 y.o. male.   HPI This is a 46 year old male who is status post emergent resection of sigmoid diverticulitis in February 2013. He had a Hartman's procedure and have his colostomy reversed on 04/13/12. At that time he did have a peristomal hernia which was primarily repaired. He has developed a midline ventral hernia at his previous incision. His wound was opened and packed and this is likely contributed to development of a ventral hernia. He also complains of persistent left groin pain. This has been present for the last couple of months. He is back to full duty at work and doing well. Appetite and bowel movements are normal.  Past Medical History  Diagnosis Date  . Thyroid disease   . Hyperthyroidism   . Depression   . Diverticulitis   . Pneumonia   . GERD (gastroesophageal reflux disease)   . Arthritis   . Hypertension     EKG 01/16/12 EPIC  . Sleep apnea     does not wear cpap/states MILD- no sleep study in years    Past Surgical History  Procedure Date  . Knee surgery   . Shoulder surgery   . Colostomy revision 01/06/2012    Procedure: COLON RESECTION SIGMOID;  Surgeon: [name]. [name], MD;  Location: [facility];  Service: General;  Laterality: N/A;  with hartmans procedure  . Ankle surgery     arthroscopy right with excision  and scrapping of bone  . Colon surgery 01/2012  . Colostomy closure 04/13/2012    Procedure: COLOSTOMY CLOSURE;  Surgeon: [name]. [name], MD;  Location: [facility];  Service: General;  Laterality: N/A;    History reviewed. No pertinent family history.  Social History History  Substance Use Topics  . Smoking status: Never Smoker   . Smokeless tobacco: Never Used  . Alcohol Use: No    Allergies  Allergen Reactions  . Sulfa Antibiotics Hives and Rash    Current Outpatient Prescriptions    Medication Sig Dispense Refill  . levothyroxine (SYNTHROID, LEVOTHROID) 75 MCG tablet Take 75 mcg by mouth daily with supper.       . losartan (COZAAR) 100 MG tablet 100 mg daily with supper.       . meloxicam (MOBIC) 15 MG tablet Take 7.5 mg by mouth daily.       Marland Kitchen oxyCODONE-acetaminophen (PERCOCET/ROXICET) 5-325 MG per tablet Take 1 tablet by mouth every 6 (six) hours as needed for pain.  50 tablet  0  . quinapril (ACCUPRIL) 20 MG tablet Take 20 mg by mouth daily.       . Scar Treatment Products (MEDERMA) GEL Apply 1 application topically 3 (three) times daily as needed. To scar on the stomach      . traMADol (ULTRAM) 50 MG tablet Take 1 tablet (50 mg total) by mouth every 6 (six) hours as needed for pain.  40 tablet  0  . venlafaxine (EFFEXOR) 75 MG tablet         Review of Systems Review of Systems  Constitutional: Negative for fever, chills and unexpected weight change.  HENT: Negative for hearing loss, congestion, sore throat, trouble swallowing and voice change.   Eyes: Negative for visual disturbance.  Respiratory: Negative for cough and wheezing.   Cardiovascular: Negative for chest pain, palpitations and leg swelling.  Gastrointestinal: Positive for abdominal pain and abdominal distention. Negative for nausea, vomiting, diarrhea, constipation, blood in stool, anal bleeding and rectal pain.  Genitourinary: Positive for testicular pain. Negative for hematuria and difficulty urinating.  Musculoskeletal: Negative for arthralgias.  Skin: Negative for rash and wound.  Neurological: Negative for seizures, syncope, weakness and headaches.  Hematological: Negative for adenopathy. Does not bruise/bleed easily.  Psychiatric/Behavioral: Negative for confusion.    Blood pressure 124/80, pulse 86, temperature 97 F (36.1 C), temperature source Temporal, resp. rate 18, height 6\' 4"  (1.93 m), weight 263 lb (119.296 kg).  Physical Exam Physical Exam WDWN in NAD HEENT:  EOMI, sclera  anicteric Neck:  No masses, no thyromegaly Lungs:  CTA bilaterally; normal respiratory effort CV:  Regular rate and rhythm; no murmurs Abd:  +bowel sounds, soft, wide healed midline incision - protruding ventral hernia; fascial edges about 4 cm apart.  No sign of hernia at LLQ ostomy site GU:  Bilateral descended testes; no testicular masses; small left inguinal hernia on Valsalva maneuver Ext:  Well-perfused; no edema Skin:  Warm, dry; no sign of jaundice  Data Reviewed CT scan 06/16/12 Clinical Data: Left lower quadrant pain post colostomy reversal.  CT ABDOMEN AND PELVIS WITH CONTRAST  Technique: Multidetector CT imaging of the abdomen and pelvis was  performed following the standard protocol during bolus  administration of intravenous contrast.  Contrast: OMNIPAQUE IOHEXOL 300 MG/ML SOLN, 1 OMNIPAQUE  IOHEXOL 300 MG/ML SOLN  Comparison: 01/05/2012.  Findings: Diffuse inflammatory process surrounds the sigmoid colon  at the expected level of the reanastomosis. Fluid and air  containing collection deep to and appearing to involve the left  rectus muscles consistent with abscess with fistula to the colonic  anastomotic site.  Fatty liver. No focal hepatic, splenic, pancreatic, left renal or  adrenal lesion. Sub centimeter right upper pole low density  structure possibly a cyst although too small to adequately  characterize.  Small calcification left common iliac artery. No abdominal aortic  aneurysm. No calcified gallstones.  Degenerative changes L1-2. No bony destructive lesion.  IMPRESSION:  Diffuse inflammatory process surrounds the sigmoid colon at the  expected level of the reanastomosis. Fluid and air containing  collection deep to and appearing to involve the left rectus muscles  consistent with abscess with fistula to the colonic anastomotic  site.  Critical Value/emergent results were called by telephone at the  time of interpretation on 06/16/2012 at 3:40 p.m. to  [facility]  physician's assistant, who verbally acknowledged these results.  Original Report Authenticated By: Fuller Canada, M.D.   Assessment    Ventral incisional hernia Small left inguinal hernia    Plan    Open ventral hernia repair with mesh.  Left inguinal hernia repair with mesh.  The surgical procedure has been discussed with the patient.  Potential risks, benefits, alternative treatments, and expected outcomes have been explained.  All of the patient's questions at this time have been answered.  The likelihood of reaching the patient's treatment goal is good.  The patient understand the proposed surgical procedure and wishes to proceed.        Amandeep Hogston K. 09/17/2012, 11:18 AM

## 2012-10-16 NOTE — Pre-Procedure Instructions (Signed)
20 GAMALIEL CHARNEY  10/16/2012   Your procedure is scheduled on:  Monday, November 25th.  Report to [facility] at 7:00 AM.  Call this number if you have problems the morning of surgery: [phone]   Remember: Nothing to eat or drink after Midnight.      Take these medicines the morning of surgery with A SIP OF WATER: Levothyroxine (Synthyroid),  Venlafaxine (Effexor).  May take pain medication if needed.     Do not wear jewelry, make-up or nail polish.  Do not wear lotions, powders, or perfumes. You may wear deodorant.  Do not shave 48 hours prior to surgery. Men may shave face and neck.  Do not bring valuables to the hospital.  Contacts, dentures or bridgework may not be worn into surgery.  Leave suitcase in the car. After surgery it may be brought to your room.  For patients admitted to the hospital, checkout time is 11:00 AM the day of discharge.   Patients discharged the day of surgery will not be allowed to drive home.  Name and phone number of your driver: ___________________  Special Instructions: Shower using CHG 2 nights before surgery and the night before surgery.  If you shower the day of surgery use CHG.  Use special wash - you have one bottle of CHG for all showers.  You should use approximately 1/3 of the bottle for each shower.   Please read over the following fact sheets that you were given: Pain Booklet, Coughing and Deep Breathing and Surgical Site Infection Prevention

## 2012-10-19 ENCOUNTER — Telehealth (INDEPENDENT_AMBULATORY_CARE_PROVIDER_SITE_OTHER): Payer: Self-pay | Admitting: General Surgery

## 2012-10-19 ENCOUNTER — Encounter (HOSPITAL_COMMUNITY): Payer: Self-pay

## 2012-10-19 ENCOUNTER — Encounter (HOSPITAL_COMMUNITY)
Admission: RE | Admit: 2012-10-19 | Discharge: 2012-10-19 | Disposition: A | Discharge status: Home / Self Care | Payer: BC Managed Care – PPO | Source: Ambulatory Visit | Attending: Surgery | Admitting: Surgery

## 2012-10-19 ENCOUNTER — Other Ambulatory Visit (INDEPENDENT_AMBULATORY_CARE_PROVIDER_SITE_OTHER): Payer: Self-pay | Admitting: Surgery

## 2012-10-19 HISTORY — DX: Hypothyroidism, unspecified: E03.9

## 2012-10-19 LAB — BASIC METABOLIC PANEL
BUN: 20 mg/dL (ref 6–23)
Chloride: 103 mEq/L (ref 96–112)
Creatinine, Ser: 0.94 mg/dL (ref 0.50–1.35)
GFR calc Af Amer: >90 mL/min (ref >90)
Glucose, Bld: 107 mg/dL — ABNORMAL HIGH (ref 70–99)
Potassium: 4.1 mEq/L (ref 3.5–5.1)

## 2012-10-19 LAB — CBC
HCT: 44 % (ref 39.0–52.0)
Hemoglobin: 15.3 g/dL (ref 13.0–17.0)
MCH: 29.7 pg (ref 26.0–34.0)
MCHC: 34.8 g/dL (ref 30.0–36.0)
RDW: 13.7 % (ref 11.5–15.5)

## 2012-10-19 LAB — SURGICAL PCR SCREEN: MRSA, PCR: NEGATIVE

## 2012-10-19 MED ORDER — VANCOMYCIN HCL 1000 MG IV SOLR: 1000.0000 mg | Freq: Once | INTRAVENOUS | Status: DC

## 2012-10-19 NOTE — Telephone Encounter (Signed)
Pt called for pain med refill.  Paged and updated Dr. [name].  As he is in the hospital, he okayed the refill but asked if a partner in the office could sign the Rx.  Pt aware to pick up at the front desk.

## 2012-10-25 MED ORDER — DEXTROSE 5 % IV SOLN
3.0000 g | INTRAVENOUS | Status: AC
  Administered 2012-10-26: 3 g via INTRAVENOUS
  Filled 2012-10-25 (×2): qty 3000

## 2012-10-25 NOTE — H&P (Signed)
HPI Douglas Wiggins is a 46 y.o. male.   HPI This is a 46 year old male who is status post emergent resection of sigmoid diverticulitis in February 2013. He had a Hartman's procedure and have his colostomy reversed on 04/13/12. At that time he did have a peristomal hernia which was primarily repaired. He has developed a midline ventral hernia at his previous incision. His wound was opened and packed and this is likely contributed to development of a ventral hernia. He also complains of persistent left groin pain. This has been present for the last couple of months. He is back to full duty at work and doing well. Appetite and bowel movements are normal.    Past Medical History   Diagnosis  Date   .  Thyroid disease     .  Hyperthyroidism     .  Depression     .  Diverticulitis     .  Pneumonia     .  GERD (gastroesophageal reflux disease)     .  Arthritis     .  Hypertension         EKG 01/16/12 EPIC   .  Sleep apnea         does not wear cpap/states MILD- no sleep study in years         Past Surgical History   Procedure  Date   .  Knee surgery     .  Shoulder surgery     .  Colostomy revision  01/06/2012       Procedure: COLON RESECTION SIGMOID;  Surgeon: [name]. [name], MD;  Location: [facility];  Service: General;  Laterality: N/A;  with hartmans procedure   .  Ankle surgery         arthroscopy right with excision  and scrapping of bone   .  Colon surgery  01/2012   .  Colostomy closure  04/13/2012       Procedure: COLOSTOMY CLOSURE;  Surgeon: [name]. [name], MD;  Location: [facility];  Service: General;  Laterality: N/A;        History reviewed. No pertinent family history.   Social History History   Substance Use Topics   .  Smoking status:  Never Smoker    .  Smokeless tobacco:  Never Used   .  Alcohol Use:  No         Allergies   Allergen  Reactions   .  Sulfa Antibiotics  Hives and Rash         Current Outpatient Prescriptions   Medication  Sig  Dispense   Refill   .  levothyroxine (SYNTHROID, LEVOTHROID) 75 MCG tablet  Take 75 mcg by mouth daily with supper.          .  losartan (COZAAR) 100 MG tablet  100 mg daily with supper.          .  meloxicam (MOBIC) 15 MG tablet  Take 7.5 mg by mouth daily.          Marland Kitchen  oxyCODONE-acetaminophen (PERCOCET/ROXICET) 5-325 MG per tablet  Take 1 tablet by mouth every 6 (six) hours as needed for pain.   50 tablet   0   .  quinapril (ACCUPRIL) 20 MG tablet  Take 20 mg by mouth daily.          .  Scar Treatment Products (MEDERMA) GEL  Apply 1 application topically 3 (three) times daily as needed. To scar  on the stomach         .  traMADol (ULTRAM) 50 MG tablet  Take 1 tablet (50 mg total) by mouth every 6 (six) hours as needed for pain.   40 tablet   0   .  venlafaxine (EFFEXOR) 75 MG tablet                Review of Systems Review of Systems  Constitutional: Negative for fever, chills and unexpected weight change.  HENT: Negative for hearing loss, congestion, sore throat, trouble swallowing and voice change.   Eyes: Negative for visual disturbance.  Respiratory: Negative for cough and wheezing.   Cardiovascular: Negative for chest pain, palpitations and leg swelling.  Gastrointestinal: Positive for abdominal pain and abdominal distention. Negative for nausea, vomiting, diarrhea, constipation, blood in stool, anal bleeding and rectal pain.  Genitourinary: Positive for testicular pain. Negative for hematuria and difficulty urinating.  Musculoskeletal: Negative for arthralgias.  Skin: Negative for rash and wound.  Neurological: Negative for seizures, syncope, weakness and headaches.  Hematological: Negative for adenopathy. Does not bruise/bleed easily.  Psychiatric/Behavioral: Negative for confusion.      Blood pressure 124/80, pulse 86, temperature 97 F (36.1 C), temperature source Temporal, resp. rate 18, height 6\' 4"  (1.93 m), weight 263 lb (119.296 kg).   Physical Exam Physical Exam WDWN in  NAD HEENT:  EOMI, sclera anicteric Neck:  No masses, no thyromegaly Lungs:  CTA bilaterally; normal respiratory effort CV:  Regular rate and rhythm; no murmurs Abd:  +bowel sounds, soft, wide healed midline incision - protruding ventral hernia; fascial edges about 4 cm apart.  No sign of hernia at LLQ ostomy site GU:  Bilateral descended testes; no testicular masses; small left inguinal hernia on Valsalva maneuver Ext:  Well-perfused; no edema Skin:  Warm, dry; no sign of jaundice   Data Reviewed CT scan 06/16/12 Clinical Data: Left lower quadrant pain post colostomy reversal.   CT ABDOMEN AND PELVIS WITH CONTRAST   Technique: Multidetector CT imaging of the abdomen and pelvis was   performed following the standard protocol during bolus   administration of intravenous contrast.   Contrast: OMNIPAQUE IOHEXOL 300 MG/ML SOLN, 1 OMNIPAQUE   IOHEXOL 300 MG/ML SOLN   Comparison: 01/05/2012.   Findings: Diffuse inflammatory process surrounds the sigmoid colon   at the expected level of the reanastomosis. 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   Fatty liver. No focal hepatic, splenic, pancreatic, left renal or   adrenal lesion. Sub centimeter right upper pole low density   structure possibly a cyst although too small to adequately   characterize.   Small calcification left common iliac artery. No abdominal aortic   aneurysm. No calcified gallstones.   Degenerative changes L1-2. No bony destructive lesion.   IMPRESSION:   Diffuse inflammatory process surrounds the sigmoid colon at the   expected level of the reanastomosis. 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   Critical Value/emergent results were called by telephone at the    time of interpretation on 06/16/2012 at 3:40 p.m. to [facility]   physician's assistant, who verbally acknowledged these results.   Original Report Authenticated By: Fuller Canada, M.D.     Assessment  Ventral incisional hernia Small left inguinal hernia     Plan    Open ventral hernia repair with mesh.  Left inguinal hernia repair with mesh.  The surgical procedure has been discussed with the patient.  Potential risks, benefits, alternative treatments, and expected outcomes have been explained.  All of the patient's questions at this time have been answered.  The likelihood of reaching the patient's treatment goal is good.  The patient understand the proposed surgical procedure and wishes to proceed.   [name]. [name], MD, [facility]  10/25/2012 6:50 PM

## 2012-10-26 ENCOUNTER — Encounter (HOSPITAL_COMMUNITY)
Admission: RE | Disposition: A | Discharge status: Home Health | Payer: Self-pay | Source: Ambulatory Visit | Attending: Surgery

## 2012-10-26 ENCOUNTER — Encounter (HOSPITAL_COMMUNITY): Payer: Self-pay | Admitting: *Deleted

## 2012-10-26 ENCOUNTER — Inpatient Hospital Stay (HOSPITAL_COMMUNITY)
Admission: RE | Admit: 2012-10-26 | Discharge: 2012-11-03 | DRG: 585 | Disposition: A | Discharge status: Home Health | Payer: BC Managed Care – PPO | Source: Ambulatory Visit | Attending: Surgery | Admitting: Surgery

## 2012-10-26 ENCOUNTER — Ambulatory Visit (HOSPITAL_COMMUNITY): Payer: BC Managed Care – PPO | Admitting: Anesthesiology

## 2012-10-26 ENCOUNTER — Encounter (HOSPITAL_COMMUNITY): Payer: Self-pay | Admitting: Anesthesiology

## 2012-10-26 DIAGNOSIS — M79A3 Nontraumatic compartment syndrome of abdomen: Secondary | ICD-10-CM | POA: Diagnosis not present

## 2012-10-26 DIAGNOSIS — R404 Transient alteration of awareness: Secondary | ICD-10-CM | POA: Diagnosis not present

## 2012-10-26 DIAGNOSIS — I10 Essential (primary) hypertension: Secondary | ICD-10-CM | POA: Diagnosis present

## 2012-10-26 DIAGNOSIS — G9349 Other encephalopathy: Secondary | ICD-10-CM | POA: Diagnosis not present

## 2012-10-26 DIAGNOSIS — Z9889 Other specified postprocedural states: Secondary | ICD-10-CM

## 2012-10-26 DIAGNOSIS — K5720 Diverticulitis of large intestine with perforation and abscess without bleeding: Secondary | ICD-10-CM | POA: Diagnosis present

## 2012-10-26 DIAGNOSIS — T8111XA Postprocedural  cardiogenic shock, initial encounter: Secondary | ICD-10-CM | POA: Diagnosis not present

## 2012-10-26 DIAGNOSIS — Y9210 Unspecified residential institution as the place of occurrence of the external cause: Secondary | ICD-10-CM | POA: Diagnosis not present

## 2012-10-26 DIAGNOSIS — K432 Incisional hernia without obstruction or gangrene: Secondary | ICD-10-CM

## 2012-10-26 DIAGNOSIS — E669 Obesity, unspecified: Secondary | ICD-10-CM | POA: Diagnosis present

## 2012-10-26 DIAGNOSIS — K661 Hemoperitoneum: Secondary | ICD-10-CM | POA: Diagnosis not present

## 2012-10-26 DIAGNOSIS — J95821 Acute postprocedural respiratory failure: Secondary | ICD-10-CM

## 2012-10-26 DIAGNOSIS — D62 Acute posthemorrhagic anemia: Secondary | ICD-10-CM | POA: Diagnosis not present

## 2012-10-26 DIAGNOSIS — G4730 Sleep apnea, unspecified: Secondary | ICD-10-CM | POA: Diagnosis present

## 2012-10-26 DIAGNOSIS — Z882 Allergy status to sulfonamides status: Secondary | ICD-10-CM

## 2012-10-26 DIAGNOSIS — G9340 Encephalopathy, unspecified: Secondary | ICD-10-CM | POA: Diagnosis not present

## 2012-10-26 DIAGNOSIS — R579 Shock, unspecified: Secondary | ICD-10-CM | POA: Diagnosis not present

## 2012-10-26 DIAGNOSIS — F3289 Other specified depressive episodes: Secondary | ICD-10-CM | POA: Diagnosis present

## 2012-10-26 DIAGNOSIS — IMO0002 Reserved for concepts with insufficient information to code with codable children: Secondary | ICD-10-CM | POA: Diagnosis not present

## 2012-10-26 DIAGNOSIS — Z9049 Acquired absence of other specified parts of digestive tract: Secondary | ICD-10-CM

## 2012-10-26 DIAGNOSIS — K219 Gastro-esophageal reflux disease without esophagitis: Secondary | ICD-10-CM | POA: Diagnosis present

## 2012-10-26 DIAGNOSIS — J9601 Acute respiratory failure with hypoxia: Secondary | ICD-10-CM | POA: Diagnosis not present

## 2012-10-26 DIAGNOSIS — E039 Hypothyroidism, unspecified: Secondary | ICD-10-CM | POA: Diagnosis present

## 2012-10-26 DIAGNOSIS — N9989 Other postprocedural complications and disorders of genitourinary system: Secondary | ICD-10-CM | POA: Diagnosis not present

## 2012-10-26 DIAGNOSIS — N289 Disorder of kidney and ureter, unspecified: Secondary | ICD-10-CM | POA: Diagnosis not present

## 2012-10-26 DIAGNOSIS — K439 Ventral hernia without obstruction or gangrene: Secondary | ICD-10-CM

## 2012-10-26 DIAGNOSIS — N170 Acute kidney failure with tubular necrosis: Secondary | ICD-10-CM | POA: Diagnosis not present

## 2012-10-26 DIAGNOSIS — Y832 Surgical operation with anastomosis, bypass or graft as the cause of abnormal reaction of the patient, or of later complication, without mention of misadventure at the time of the procedure: Secondary | ICD-10-CM | POA: Diagnosis not present

## 2012-10-26 DIAGNOSIS — K660 Peritoneal adhesions (postprocedural) (postinfection): Secondary | ICD-10-CM

## 2012-10-26 DIAGNOSIS — F329 Major depressive disorder, single episode, unspecified: Secondary | ICD-10-CM | POA: Diagnosis present

## 2012-10-26 DIAGNOSIS — K4090 Unilateral inguinal hernia, without obstruction or gangrene, not specified as recurrent: Secondary | ICD-10-CM | POA: Diagnosis present

## 2012-10-26 DIAGNOSIS — T79A3XA Traumatic compartment syndrome of abdomen, initial encounter: Secondary | ICD-10-CM | POA: Diagnosis not present

## 2012-10-26 HISTORY — PX: VENTRAL HERNIA REPAIR: SHX424

## 2012-10-26 HISTORY — PX: LAPAROTOMY: SHX154

## 2012-10-26 HISTORY — PX: COLON RESECTION: SHX5231

## 2012-10-26 LAB — CBC
HCT: 45.1 % (ref 39.0–52.0)
Hemoglobin: 15.8 g/dL (ref 13.0–17.0)
MCHC: 35 g/dL (ref 30.0–36.0)
RBC: 5.23 MIL/uL (ref 4.22–5.81)
WBC: 21.5 10*3/uL — ABNORMAL HIGH (ref 4.0–10.5)

## 2012-10-26 LAB — CREATININE, SERUM
GFR calc Af Amer: >90 mL/min (ref >90)
GFR calc non Af Amer: >90 mL/min (ref >90)

## 2012-10-26 SURGERY — REPAIR, HERNIA, VENTRAL
Anesthesia: General | Site: Abdomen | Wound class: Clean Contaminated

## 2012-10-26 MED ORDER — NALOXONE HCL 0.4 MG/ML IJ SOLN: 0.4000 mg | INTRAMUSCULAR | Status: DC | PRN

## 2012-10-26 MED ORDER — CHLORHEXIDINE GLUCONATE 0.12 % MT SOLN: 15.0000 mL | Freq: Two times a day (BID) | OROMUCOSAL | Status: DC

## 2012-10-26 MED ORDER — CHLORHEXIDINE GLUCONATE 4 % EX LIQD: 1.0000 "application " | Freq: Once | CUTANEOUS | Status: DC

## 2012-10-26 MED ORDER — 0.9 % SODIUM CHLORIDE (POUR BTL) OPTIME
TOPICAL | Status: DC | PRN
  Administered 2012-10-26 (×4): 1000 mL

## 2012-10-26 MED ORDER — HYDROMORPHONE HCL PF 1 MG/ML IJ SOLN
INTRAMUSCULAR | Status: AC
  Filled 2012-10-26: qty 1

## 2012-10-26 MED ORDER — ZOLPIDEM TARTRATE 5 MG PO TABS: 5.0000 mg | ORAL_TABLET | Freq: Every evening | ORAL | Status: DC | PRN

## 2012-10-26 MED ORDER — ONDANSETRON HCL 4 MG/2ML IJ SOLN
INTRAMUSCULAR | Status: DC | PRN
  Administered 2012-10-26: 4 mg via INTRAVENOUS

## 2012-10-26 MED ORDER — NEOSTIGMINE METHYLSULFATE 1 MG/ML IJ SOLN
INTRAMUSCULAR | Status: DC | PRN
  Administered 2012-10-26: 4 mg via INTRAVENOUS

## 2012-10-26 MED ORDER — LEVOTHYROXINE SODIUM 75 MCG PO TABS
75.0000 ug | ORAL_TABLET | Freq: Every day | ORAL | Status: DC
  Filled 2012-10-26 (×2): qty 1

## 2012-10-26 MED ORDER — ENOXAPARIN SODIUM 40 MG/0.4ML ~~LOC~~ SOLN
40.0000 mg | SUBCUTANEOUS | Status: DC
  Filled 2012-10-26 (×2): qty 0.4

## 2012-10-26 MED ORDER — LACTATED RINGERS IV SOLN
INTRAVENOUS | Status: DC | PRN
  Administered 2012-10-26 (×3): via INTRAVENOUS

## 2012-10-26 MED ORDER — VENLAFAXINE HCL 75 MG PO TABS
150.0000 mg | ORAL_TABLET | Freq: Two times a day (BID) | ORAL | Status: DC
  Administered 2012-10-26: 150 mg via ORAL
  Filled 2012-10-26 (×4): qty 2

## 2012-10-26 MED ORDER — HYDROMORPHONE 0.3 MG/ML IV SOLN
INTRAVENOUS | Status: AC
  Filled 2012-10-26: qty 25

## 2012-10-26 MED ORDER — QUINAPRIL HCL 10 MG PO TABS
20.0000 mg | ORAL_TABLET | Freq: Every day | ORAL | Status: DC
  Filled 2012-10-26 (×2): qty 2

## 2012-10-26 MED ORDER — HYDROMORPHONE HCL PF 1 MG/ML IJ SOLN
INTRAMUSCULAR | Status: AC
  Administered 2012-10-26: 0.5 mg via INTRAVENOUS
  Filled 2012-10-26: qty 1

## 2012-10-26 MED ORDER — VECURONIUM BROMIDE 10 MG IV SOLR
INTRAVENOUS | Status: DC | PRN
  Administered 2012-10-26: 1 mg via INTRAVENOUS
  Administered 2012-10-26 (×2): 2 mg via INTRAVENOUS

## 2012-10-26 MED ORDER — BUPIVACAINE-EPINEPHRINE PF 0.25-1:200000 % IJ SOLN
INTRAMUSCULAR | Status: AC
  Filled 2012-10-26: qty 30

## 2012-10-26 MED ORDER — DIPHENHYDRAMINE HCL 12.5 MG/5ML PO ELIX
12.5000 mg | ORAL_SOLUTION | Freq: Four times a day (QID) | ORAL | Status: DC | PRN
  Filled 2012-10-26: qty 5

## 2012-10-26 MED ORDER — ONDANSETRON HCL 4 MG PO TABS: 4.0000 mg | ORAL_TABLET | Freq: Four times a day (QID) | ORAL | Status: DC | PRN

## 2012-10-26 MED ORDER — ONDANSETRON HCL 4 MG/2ML IJ SOLN: 4.0000 mg | Freq: Four times a day (QID) | INTRAMUSCULAR | Status: DC | PRN

## 2012-10-26 MED ORDER — LACTATED RINGERS IV SOLN
INTRAVENOUS | Status: DC
  Administered 2012-10-26: 09:00:00 via INTRAVENOUS

## 2012-10-26 MED ORDER — SODIUM CHLORIDE 0.9 % IJ SOLN: 9.0000 mL | INTRAMUSCULAR | Status: DC | PRN

## 2012-10-26 MED ORDER — BIOTENE DRY MOUTH MT LIQD
15.0000 mL | Freq: Two times a day (BID) | OROMUCOSAL | Status: DC
  Administered 2012-10-26 – 2012-11-01 (×12): 15 mL via OROMUCOSAL

## 2012-10-26 MED ORDER — ONDANSETRON HCL 4 MG/2ML IJ SOLN
4.0000 mg | Freq: Four times a day (QID) | INTRAMUSCULAR | Status: DC | PRN
  Administered 2012-10-27: 4 mg via INTRAVENOUS
  Filled 2012-10-26: qty 2

## 2012-10-26 MED ORDER — KETOROLAC TROMETHAMINE 15 MG/ML IJ SOLN
15.0000 mg | Freq: Four times a day (QID) | INTRAMUSCULAR | Status: DC
  Administered 2012-10-26 – 2012-10-27 (×2): 15 mg via INTRAVENOUS
  Filled 2012-10-26 (×6): qty 1

## 2012-10-26 MED ORDER — LOSARTAN POTASSIUM 50 MG PO TABS
100.0000 mg | ORAL_TABLET | Freq: Every day | ORAL | Status: DC
  Administered 2012-10-26: 100 mg via ORAL
  Filled 2012-10-26 (×2): qty 2

## 2012-10-26 MED ORDER — DEXTROSE 5 % IV SOLN
1.0000 g | Freq: Four times a day (QID) | INTRAVENOUS | Status: AC
  Administered 2012-10-26 – 2012-10-27 (×3): 1 g via INTRAVENOUS
  Filled 2012-10-26 (×3): qty 1

## 2012-10-26 MED ORDER — ONDANSETRON HCL 4 MG/2ML IJ SOLN: 4.0000 mg | Freq: Once | INTRAMUSCULAR | Status: DC | PRN

## 2012-10-26 MED ORDER — GLYCOPYRROLATE 0.2 MG/ML IJ SOLN
INTRAMUSCULAR | Status: DC | PRN
  Administered 2012-10-26: 0.6 mg via INTRAVENOUS

## 2012-10-26 MED ORDER — MIDAZOLAM HCL 5 MG/5ML IJ SOLN
INTRAMUSCULAR | Status: DC | PRN
  Administered 2012-10-26: 2 mg via INTRAVENOUS

## 2012-10-26 MED ORDER — HYDROMORPHONE 0.3 MG/ML IV SOLN
INTRAVENOUS | Status: DC
  Administered 2012-10-26: 2.1 mg via INTRAVENOUS
  Administered 2012-10-26: 13:00:00 via INTRAVENOUS
  Administered 2012-10-26: 0.6 mg via INTRAVENOUS
  Administered 2012-10-26: 1.5 mg via INTRAVENOUS
  Administered 2012-10-27: 2.1 mg via INTRAVENOUS
  Administered 2012-10-27: 04:00:00 via INTRAVENOUS
  Administered 2012-10-27: 1.8 mg via INTRAVENOUS
  Filled 2012-10-26: qty 25

## 2012-10-26 MED ORDER — KCL IN DEXTROSE-NACL 20-5-0.45 MEQ/L-%-% IV SOLN
INTRAVENOUS | Status: DC
  Administered 2012-10-26 – 2012-10-27 (×3): via INTRAVENOUS
  Filled 2012-10-26 (×4): qty 1000

## 2012-10-26 MED ORDER — LIDOCAINE HCL (CARDIAC) 20 MG/ML IV SOLN
INTRAVENOUS | Status: DC | PRN
  Administered 2012-10-26: 60 mg via INTRAVENOUS

## 2012-10-26 MED ORDER — HYDROMORPHONE HCL PF 1 MG/ML IJ SOLN
0.2500 mg | INTRAMUSCULAR | Status: DC | PRN
  Administered 2012-10-26 (×4): 0.5 mg via INTRAVENOUS

## 2012-10-26 MED ORDER — EPHEDRINE SULFATE 50 MG/ML IJ SOLN
INTRAMUSCULAR | Status: DC | PRN
  Administered 2012-10-26 (×2): 10 mg via INTRAVENOUS

## 2012-10-26 MED ORDER — FENTANYL CITRATE 0.05 MG/ML IJ SOLN
INTRAMUSCULAR | Status: DC | PRN
  Administered 2012-10-26: 150 ug via INTRAVENOUS
  Administered 2012-10-26 (×2): 50 ug via INTRAVENOUS

## 2012-10-26 MED ORDER — PROPOFOL 10 MG/ML IV BOLUS
INTRAVENOUS | Status: DC | PRN
  Administered 2012-10-26: 200 mg via INTRAVENOUS

## 2012-10-26 MED ORDER — DIPHENHYDRAMINE HCL 50 MG/ML IJ SOLN: 12.5000 mg | Freq: Four times a day (QID) | INTRAMUSCULAR | Status: DC | PRN

## 2012-10-26 MED ORDER — ROCURONIUM BROMIDE 100 MG/10ML IV SOLN
INTRAVENOUS | Status: DC | PRN
  Administered 2012-10-26 (×2): 10 mg via INTRAVENOUS
  Administered 2012-10-26: 50 mg via INTRAVENOUS
  Administered 2012-10-26: 30 mg via INTRAVENOUS

## 2012-10-26 SURGICAL SUPPLY — 74 items
BENZOIN TINCTURE PRP APPL 2/3 (GAUZE/BANDAGES/DRESSINGS) IMPLANT
BINDER ABD UNIV 12 45-62 (WOUND CARE) ×3 IMPLANT
BINDER ABDOMINAL 46IN 62IN (WOUND CARE) ×4
BLADE SURG 15 STRL LF DISP TIS (BLADE) ×3 IMPLANT
BLADE SURG 15 STRL SS (BLADE) ×1
BLADE SURG ROTATE 9660 (MISCELLANEOUS) ×4 IMPLANT
CANISTER SUCTION 2500CC (MISCELLANEOUS) ×4 IMPLANT
CHLORAPREP W/TINT 26ML (MISCELLANEOUS) ×4 IMPLANT
CLOTH BEACON ORANGE TIMEOUT ST (SAFETY) ×4 IMPLANT
COVER SURGICAL LIGHT HANDLE (MISCELLANEOUS) ×4 IMPLANT
DECANTER SPIKE VIAL GLASS SM (MISCELLANEOUS) IMPLANT
DRAIN PENROSE 1/2X12 LTX STRL (WOUND CARE) IMPLANT
DRAPE LAPAROSCOPIC ABDOMINAL (DRAPES) ×4 IMPLANT
DRAPE LAPAROTOMY TRNSV 102X78 (DRAPE) IMPLANT
DRAPE UTILITY 15X26 W/TAPE STR (DRAPE) ×8 IMPLANT
DRSG COVADERM 4X14 (GAUZE/BANDAGES/DRESSINGS) ×4 IMPLANT
DRSG TEGADERM 4X4.75 (GAUZE/BANDAGES/DRESSINGS) IMPLANT
ELECT BLADE 6.5 EXT (BLADE) ×4 IMPLANT
ELECT CAUTERY BLADE 6.4 (BLADE) ×4 IMPLANT
ELECT REM PT RETURN 9FT ADLT (ELECTROSURGICAL) ×4
ELECTRODE REM PT RTRN 9FT ADLT (ELECTROSURGICAL) ×3 IMPLANT
GAUZE SPONGE 4X4 16PLY XRAY LF (GAUZE/BANDAGES/DRESSINGS) IMPLANT
GLOVE BIO SURGEON STRL SZ7 (GLOVE) ×8 IMPLANT
GLOVE BIO SURGEON STRL SZ8 (GLOVE) ×8 IMPLANT
GLOVE BIOGEL PI IND STRL 7.0 (GLOVE) ×3 IMPLANT
GLOVE BIOGEL PI IND STRL 7.5 (GLOVE) ×3 IMPLANT
GLOVE BIOGEL PI INDICATOR 7.0 (GLOVE) ×1
GLOVE BIOGEL PI INDICATOR 7.5 (GLOVE) ×1
GLOVE SURG SS PI 7.0 STRL IVOR (GLOVE) ×8 IMPLANT
GOWN STRL NON-REIN LRG LVL3 (GOWN DISPOSABLE) ×8 IMPLANT
GOWN STRL REIN XL XLG (GOWN DISPOSABLE) ×4 IMPLANT
KIT BASIN OR (CUSTOM PROCEDURE TRAY) ×4 IMPLANT
KIT ROOM TURNOVER OR (KITS) ×4 IMPLANT
LIGASURE IMPACT 36 18CM CVD LR (INSTRUMENTS) ×4 IMPLANT
NEEDLE HYPO 25GX1X1/2 BEV (NEEDLE) ×4 IMPLANT
NS IRRIG 1000ML POUR BTL (IV SOLUTION) ×4 IMPLANT
PACK GENERAL/GYN (CUSTOM PROCEDURE TRAY) ×4 IMPLANT
PACK SURGICAL SETUP 50X90 (CUSTOM PROCEDURE TRAY) IMPLANT
PAD ARMBOARD 7.5X6 YLW CONV (MISCELLANEOUS) ×8 IMPLANT
PENCIL BUTTON HOLSTER BLD 10FT (ELECTRODE) IMPLANT
RELOAD PROXIMATE 75MM BLUE (ENDOMECHANICALS) ×8 IMPLANT
SPECIMEN JAR SMALL (MISCELLANEOUS) IMPLANT
SPONGE GAUZE 4X4 12PLY (GAUZE/BANDAGES/DRESSINGS) IMPLANT
SPONGE INTESTINAL PEANUT (DISPOSABLE) ×4 IMPLANT
SPONGE LAP 18X18 X RAY DECT (DISPOSABLE) ×4 IMPLANT
STAPLER GUN LINEAR PROX 60 (STAPLE) ×4 IMPLANT
STAPLER PROXIMATE 75MM BLUE (STAPLE) ×4 IMPLANT
STAPLER VISISTAT 35W (STAPLE) ×4 IMPLANT
STRIP CLOSURE SKIN 1/2X4 (GAUZE/BANDAGES/DRESSINGS) ×4 IMPLANT
SUCTION POOLE TIP (SUCTIONS) ×4 IMPLANT
SUT MNCRL AB 4-0 PS2 18 (SUTURE) ×4 IMPLANT
SUT NOVA NAB DX-16 0-1 5-0 T12 (SUTURE) ×16 IMPLANT
SUT NOVA NAB GS-21 0 18 T12 DT (SUTURE) IMPLANT
SUT NOVA NAB GS-21 1 T12 (SUTURE) IMPLANT
SUT PDS AB 0 CT 36 (SUTURE) IMPLANT
SUT PROLENE 2 0 SH DA (SUTURE) ×4 IMPLANT
SUT SILK 2 0 (SUTURE) ×1
SUT SILK 2 0 SH (SUTURE) IMPLANT
SUT SILK 2 0 SH CR/8 (SUTURE) ×4 IMPLANT
SUT SILK 2-0 18XBRD TIE 12 (SUTURE) ×3 IMPLANT
SUT SILK 3 0 (SUTURE) ×1
SUT SILK 3 0 SH CR/8 (SUTURE) ×4 IMPLANT
SUT SILK 3-0 18XBRD TIE 12 (SUTURE) ×3 IMPLANT
SUT VIC AB 0 CT2 27 (SUTURE) IMPLANT
SUT VIC AB 2-0 SH 27 (SUTURE) ×1
SUT VIC AB 2-0 SH 27X BRD (SUTURE) ×3 IMPLANT
SUT VIC AB 3-0 SH 27 (SUTURE) ×1
SUT VIC AB 3-0 SH 27XBRD (SUTURE) ×3 IMPLANT
SYR CONTROL 10ML LL (SYRINGE) ×4 IMPLANT
TOWEL OR 17X24 6PK STRL BLUE (TOWEL DISPOSABLE) ×4 IMPLANT
TOWEL OR 17X26 10 PK STRL BLUE (TOWEL DISPOSABLE) ×4 IMPLANT
TRAY FOLEY CATH 14FRSI W/METER (CATHETERS) ×4 IMPLANT
WATER STERILE IRR 1000ML POUR (IV SOLUTION) IMPLANT
YANKAUER SUCT BULB TIP NO VENT (SUCTIONS) ×12 IMPLANT

## 2012-10-26 NOTE — Anesthesia Preprocedure Evaluation (Addendum)
Anesthesia Evaluation  Patient identified by MRN, date of birth, ID band Patient awake    Reviewed: Allergy & Precautions, H&P , NPO status , Patient's Chart, lab work & pertinent test results  Airway Mallampati: I TM Distance: >3 FB Neck ROM: full    Dental  (+) Teeth Intact   Pulmonary sleep apnea ,          Cardiovascular hypertension, Pt. on medications Rhythm:regular Rate:Normal     Neuro/Psych    GI/Hepatic GERD-  ,  Endo/Other  Hypothyroidism   Renal/GU      Musculoskeletal   Abdominal   Peds  Hematology   Anesthesia Other Findings   Reproductive/Obstetrics                          Anesthesia Physical Anesthesia Plan  ASA: II  Anesthesia Plan: General   Post-op Pain Management:    Induction: Intravenous  Airway Management Planned: Oral ETT  Additional Equipment:   Intra-op Plan:   Post-operative Plan: Extubation in OR  Informed Consent: I have reviewed the patients History and Physical, chart, labs and discussed the procedure including the risks, benefits and alternatives for the proposed anesthesia with the patient or authorized representative who has indicated his/her understanding and acceptance.     Plan Discussed with: CRNA, Anesthesiologist and Surgeon  Anesthesia Plan Comments:         Anesthesia Quick Evaluation

## 2012-10-26 NOTE — Transfer of Care (Signed)
Immediate Anesthesia Transfer of Care Note  Patient: Douglas Wiggins  Procedure(s) Performed: Procedure(s) (LRB) with comments: HERNIA REPAIR VENTRAL ADULT (N/A) - Primary Repair Ventral Hernia EXPLORATORY LAPAROTOMY (N/A) COLON RESECTION (N/A) - Partial colon resection with anastomosis  Patient Location: PACU  Anesthesia Type:General  Level of Consciousness: awake, alert  and oriented  Airway & Oxygen Therapy: Patient Spontanous Breathing and Patient connected to face mask oxygen  Post-op Assessment: Report given to PACU RN, Post -op Vital signs reviewed and stable and Patient moving all extremities X 4  Post vital signs: Reviewed and stable  Complications: No apparent anesthesia complications

## 2012-10-26 NOTE — Anesthesia Procedure Notes (Signed)
Procedure Name: Intubation Date/Time: 10/26/2012 9:13 AM Performed by: Quentin Ore Pre-anesthesia Checklist: Patient identified, Emergency Drugs available, Suction available, Patient being monitored and Timeout performed Patient Re-evaluated:Patient Re-evaluated prior to inductionOxygen Delivery Method: Circle system utilized Preoxygenation: Pre-oxygenation with 100% oxygen Intubation Type: IV induction Ventilation: Mask ventilation without difficulty Laryngoscope Size: Mac and 4 Grade View: Grade I Tube type: Oral Tube size: 8.0 mm Number of attempts: 1 Airway Equipment and Method: Stylet Placement Confirmation: ETT inserted through vocal cords under direct vision,  positive ETCO2 and breath sounds checked- equal and bilateral Secured at: 22 cm Tube secured with: Tape Dental Injury: Teeth and Oropharynx as per pre-operative assessment

## 2012-10-26 NOTE — Interval H&P Note (Signed)
History and Physical Interval Note:  10/26/2012 8:44 AM  Douglas Wiggins  has presented today for surgery, with the diagnosis of ventral incisioanl hernia left inguinal hernia  The various methods of treatment have been discussed with the patient and family. After consideration of risks, benefits and other options for treatment, the patient has consented to  Procedure(s) (LRB) with comments: HERNIA REPAIR VENTRAL ADULT (N/A) - open ventral hernia repair with mesh possible left inguinal hernia repair INSERTION OF MESH (N/A) HERNIA REPAIR INGUINAL ADULT (Left) as a surgical intervention .  The patient's history has been reviewed, patient examined, no change in status, stable for surgery.  I have reviewed the patient's chart and labs.  Questions were answered to the patient's satisfaction.     [name]

## 2012-10-26 NOTE — Anesthesia Postprocedure Evaluation (Signed)
  Anesthesia Post-op Note  Patient: Douglas Wiggins  Procedure(s) Performed: Procedure(s) (LRB) with comments: HERNIA REPAIR VENTRAL ADULT (N/A) - Primary Repair Ventral Hernia EXPLORATORY LAPAROTOMY (N/A) COLON RESECTION (N/A) - Partial colon resection with anastomosis  Patient Location: PACU  Anesthesia Type:General  Level of Consciousness: awake, oriented and patient cooperative  Airway and Oxygen Therapy: Patient Spontanous Breathing  Post-op Pain: mild  Post-op Assessment: Post-op Vital signs reviewed, Patient's Cardiovascular Status Stable, Respiratory Function Stable, Patent Airway, No signs of Nausea or vomiting and Pain level controlled  Post-op Vital Signs: stable  Complications: No apparent anesthesia complications

## 2012-10-26 NOTE — Op Note (Signed)
Pre-op diagnosis:  Ventral incisional hernia Postop diagnosis: Ventral incisional hernia with involved colon anastomosis Procedure performed: #1 exploratory laparotomy #2 partial colectomy with revision of colon anastomosis #3 primary repair of ventral hernia  Surgeon:  Tasman Zapata K. Assistant:  Dr. Violeta Gelinas Anesthesia: Gen. Endotracheal Indications: This is a 46 year old male who is status post Hartman's procedure for perforated diverticulitis in every of this year. He has colostomy reversed 3 months later. He has developed a ventral hernia. He also has chronic left-sided abdominal pain radiating down to his groin. There was question whether he had a left inguinal hernia. However we decided we would look at this area when we were performing his ventral hernia repair.  Operative findings: The previous handsewn end-to-end anastomosis was densely adherent and scarred into the anterior abdominal wall. There were small pockets of chronic granulation tissue. These areas of inflammation were likely causing his chronic pain. The anastomosis was dissected away from the abdominal wall and was revised. There is no significant left inguinal hernia on internal examination.  Description of procedure: The patient was brought to the operating room and placed in a supine position on the operating room table. After an adequate level of general anesthesia was obtained a Foley catheter was placed under sterile technique. The patient's abdomen was prepped with chlor prep and draped sterile fashion. A timeout was taken to ensure the proper patient and proper procedure. We excised the patient's old scar with cautery. Dissection was carried down into the subcutaneous tissues with cautery. We identified the hernia sac and carefully dissected into the hernia sac. We opened the incision widely. The omentum was densely adherent to the anterior abdominal wall.  We spent some time dissecting this away. The patient's right  side is relatively clear. On the left side the patient is a very dense hard adhesions to the anterior abdominal wall in the area of his previous colostomy site. We tried bluntly the dissect these adhesions away. We identified his colon anastomosis and it seemed that the colon anastomosis was scarred to the anterior abdominal wall. We dissected this away and it became obvious that the anastomosis needed to be revised. There were small pockets of chronic granulation tissue and fat necrosis in this area. This is likely the source of his pain. We explored his pelvis and the patient does not have any significant direct or indirect hernia defect.    We mobilized the splenic flexure. The previous colon anastomosis was resected using 2 firings of the GIA-75 stapler. The LigaSure device was used to ligate the mesentery. We created a side-to-side stapled anastomosis with another firing of the GIA-75 stapler. A crotch suture of 3-0 silk was placed. The common defect was closed with a TA 60 stapler. We closed the mesenteric defect with 2-0 silk sutures. Omentum was then placed over the anastomosis. We irrigated thoroughly and inspected for hemostasis. We identified the fascia on both sides of the abdominal wall. This was slightly retracted. We dissected the subcutaneous tissue away from the anterior surface of the fascia. We then primarily closed the fascia with multiple interrupted figure-of-eight #1 Novafil sutures. We were able to easily close the abdominal wall. We excised some of the excess skin and subcutaneous tissues anteriorly. The subcutaneous tissues were irrigated and staples were used to close the skin. The patient was then extubated and brought to the recovery room in stable condition. All sponge, instrument, and needle counts are correct.  [name]. [name], MD, [facility]  10/26/2012 11:59  AM    

## 2012-10-26 NOTE — Preoperative (Signed)
Beta Blockers   Reason not to administer Beta Blockers:Not Applicable 

## 2012-10-27 ENCOUNTER — Encounter (HOSPITAL_COMMUNITY)
Admission: RE | Disposition: A | Discharge status: Home Health | Payer: Self-pay | Source: Ambulatory Visit | Attending: Surgery

## 2012-10-27 ENCOUNTER — Inpatient Hospital Stay (HOSPITAL_COMMUNITY): Payer: BC Managed Care – PPO | Admitting: Anesthesiology

## 2012-10-27 ENCOUNTER — Inpatient Hospital Stay (HOSPITAL_COMMUNITY): Payer: BC Managed Care – PPO

## 2012-10-27 ENCOUNTER — Encounter (HOSPITAL_COMMUNITY): Payer: Self-pay | Admitting: Surgery

## 2012-10-27 ENCOUNTER — Encounter (HOSPITAL_COMMUNITY): Payer: Self-pay | Admitting: Anesthesiology

## 2012-10-27 DIAGNOSIS — I10 Essential (primary) hypertension: Secondary | ICD-10-CM

## 2012-10-27 DIAGNOSIS — E669 Obesity, unspecified: Secondary | ICD-10-CM

## 2012-10-27 DIAGNOSIS — G9340 Encephalopathy, unspecified: Secondary | ICD-10-CM

## 2012-10-27 DIAGNOSIS — J9601 Acute respiratory failure with hypoxia: Secondary | ICD-10-CM | POA: Diagnosis not present

## 2012-10-27 DIAGNOSIS — K5732 Diverticulitis of large intestine without perforation or abscess without bleeding: Secondary | ICD-10-CM

## 2012-10-27 DIAGNOSIS — E039 Hypothyroidism, unspecified: Secondary | ICD-10-CM

## 2012-10-27 DIAGNOSIS — M79A3 Nontraumatic compartment syndrome of abdomen: Secondary | ICD-10-CM

## 2012-10-27 DIAGNOSIS — K661 Hemoperitoneum: Secondary | ICD-10-CM

## 2012-10-27 DIAGNOSIS — J9600 Acute respiratory failure, unspecified whether with hypoxia or hypercapnia: Secondary | ICD-10-CM

## 2012-10-27 DIAGNOSIS — T79A3XA Traumatic compartment syndrome of abdomen, initial encounter: Secondary | ICD-10-CM

## 2012-10-27 DIAGNOSIS — R579 Shock, unspecified: Secondary | ICD-10-CM

## 2012-10-27 HISTORY — PX: LAPAROTOMY: SHX154

## 2012-10-27 LAB — POCT I-STAT 3, ART BLOOD GAS (G3+)
Acid-base deficit: 6 mmol/L — ABNORMAL HIGH (ref 0.0–2.0)
Bicarbonate: 20.6 mEq/L (ref 20.0–24.0)
Bicarbonate: 19.2 mEq/L — ABNORMAL LOW (ref 20.0–24.0)
O2 Saturation: 89 %
TCO2: 22 mmol/L (ref 0–100)
pCO2 arterial: 35.4 mmHg (ref 35.0–45.0)
pH, Arterial: 7.343 — ABNORMAL LOW (ref 7.350–7.450)
pO2, Arterial: 63 mmHg — ABNORMAL LOW (ref 80.0–100.0)

## 2012-10-27 LAB — CBC
Hemoglobin: 13.2 g/dL (ref 13.0–17.0)
MCH: 29.9 pg (ref 26.0–34.0)
MCH: 30.1 pg (ref 26.0–34.0)
MCHC: 34 g/dL (ref 30.0–36.0)
MCHC: 34.7 g/dL (ref 30.0–36.0)
MCV: 88 fL (ref 78.0–100.0)
Platelets: 312 10*3/uL (ref 150–400)
Platelets: 207 10*3/uL (ref 150–400)
RBC: 3.29 MIL/uL — ABNORMAL LOW (ref 4.22–5.81)

## 2012-10-27 LAB — URINALYSIS, ROUTINE W REFLEX MICROSCOPIC
Glucose, UA: NEGATIVE mg/dL
Nitrite: NEGATIVE
Specific Gravity, Urine: 1.019 (ref 1.005–1.030)
pH: 5.5 (ref 5.0–8.0)

## 2012-10-27 LAB — BASIC METABOLIC PANEL
BUN: 28 mg/dL — ABNORMAL HIGH (ref 6–23)
BUN: 32 mg/dL — ABNORMAL HIGH (ref 6–23)
CO2: 20 mEq/L (ref 19–32)
CO2: 21 mEq/L (ref 19–32)
Calcium: 8.8 mg/dL (ref 8.4–10.5)
Calcium: 8.7 mg/dL (ref 8.4–10.5)
Chloride: 102 mEq/L (ref 96–112)
Creatinine, Ser: 3.44 mg/dL — ABNORMAL HIGH (ref 0.50–1.35)
Creatinine, Ser: 3.62 mg/dL — ABNORMAL HIGH (ref 0.50–1.35)
GFR calc Af Amer: 21 mL/min — ABNORMAL LOW (ref >90)
GFR calc non Af Amer: 20 mL/min — ABNORMAL LOW (ref >90)
GFR calc non Af Amer: 18 mL/min — ABNORMAL LOW (ref >90)
Glucose, Bld: 138 mg/dL — ABNORMAL HIGH (ref 70–99)
Glucose, Bld: 138 mg/dL — ABNORMAL HIGH (ref 70–99)
Glucose, Bld: 123 mg/dL — ABNORMAL HIGH (ref 70–99)
Potassium: 5.4 mEq/L — ABNORMAL HIGH (ref 3.5–5.1)
Sodium: 135 mEq/L (ref 135–145)

## 2012-10-27 LAB — URINE MICROSCOPIC-ADD ON

## 2012-10-27 LAB — PROCALCITONIN: Procalcitonin: 2.68 ng/mL

## 2012-10-27 LAB — ABO/RH: ABO/RH(D): O POS

## 2012-10-27 SURGERY — LAPAROTOMY, EXPLORATORY
Anesthesia: General | Site: Abdomen | Wound class: Clean

## 2012-10-27 MED ORDER — ROCURONIUM BROMIDE 100 MG/10ML IV SOLN
INTRAVENOUS | Status: DC | PRN
  Administered 2012-10-27: 50 mg via INTRAVENOUS

## 2012-10-27 MED ORDER — ALBUTEROL SULFATE HFA 108 (90 BASE) MCG/ACT IN AERS
INHALATION_SPRAY | RESPIRATORY_TRACT | Status: DC | PRN
  Administered 2012-10-27 (×2): 2 via RESPIRATORY_TRACT

## 2012-10-27 MED ORDER — ALBUMIN HUMAN 5 % IV SOLN
INTRAVENOUS | Status: DC | PRN
  Administered 2012-10-27: 16:00:00 via INTRAVENOUS

## 2012-10-27 MED ORDER — LIDOCAINE HCL (CARDIAC) 20 MG/ML IV SOLN
INTRAVENOUS | Status: DC | PRN
  Administered 2012-10-27: 80 mg via INTRAVENOUS

## 2012-10-27 MED ORDER — SODIUM CHLORIDE 0.9 % IV SOLN
250.0000 mg | Freq: Four times a day (QID) | INTRAVENOUS | Status: AC
  Administered 2012-10-27 – 2012-10-28 (×4): 250 mg via INTRAVENOUS
  Filled 2012-10-27 (×5): qty 250

## 2012-10-27 MED ORDER — ENOXAPARIN SODIUM 30 MG/0.3ML ~~LOC~~ SOLN
30.0000 mg | SUBCUTANEOUS | Status: DC
  Filled 2012-10-27: qty 0.3

## 2012-10-27 MED ORDER — VECURONIUM BROMIDE 10 MG IV SOLR
INTRAVENOUS | Status: DC | PRN
  Administered 2012-10-27 (×2): 10 mg via INTRAVENOUS

## 2012-10-27 MED ORDER — DEXMEDETOMIDINE HCL IN NACL 400 MCG/100ML IV SOLN
0.4000 ug/kg/h | Freq: Once | INTRAVENOUS | Status: AC
  Administered 2012-10-27: 0.7 ug/kg/h via INTRAVENOUS
  Filled 2012-10-27: qty 100

## 2012-10-27 MED ORDER — SODIUM CHLORIDE 0.9 % IV SOLN
25.0000 ug/h | INTRAVENOUS | Status: DC
  Administered 2012-10-27: 50 ug/h via INTRAVENOUS
  Administered 2012-10-28: 25 ug/h via INTRAVENOUS
  Administered 2012-10-28: 200 ug/h via INTRAVENOUS
  Administered 2012-10-29: 250 ug/h via INTRAVENOUS
  Administered 2012-10-29: 30 ug/h via INTRAVENOUS
  Administered 2012-10-30: 300 ug/h via INTRAVENOUS
  Filled 2012-10-27 (×7): qty 50

## 2012-10-27 MED ORDER — LACTATED RINGERS IV SOLN
INTRAVENOUS | Status: DC | PRN
  Administered 2012-10-27 (×3): via INTRAVENOUS

## 2012-10-27 MED ORDER — ALBUTEROL SULFATE (5 MG/ML) 0.5% IN NEBU
2.5000 mg | INHALATION_SOLUTION | RESPIRATORY_TRACT | Status: DC | PRN
  Administered 2012-10-30: 2.5 mg via RESPIRATORY_TRACT
  Filled 2012-10-27: qty 0.5

## 2012-10-27 MED ORDER — OXYCODONE HCL 5 MG PO TABS: 5.0000 mg | ORAL_TABLET | Freq: Once | ORAL | Status: DC | PRN

## 2012-10-27 MED ORDER — PHENYLEPHRINE HCL 10 MG/ML IJ SOLN
10.0000 mg | INTRAVENOUS | Status: DC | PRN
  Administered 2012-10-27: 20 ug/min via INTRAVENOUS

## 2012-10-27 MED ORDER — SODIUM CHLORIDE 0.9 % IV BOLUS (SEPSIS)
500.0000 mL | Freq: Once | INTRAVENOUS | Status: AC
  Administered 2012-10-27: 500 mL via INTRAVENOUS

## 2012-10-27 MED ORDER — PANTOPRAZOLE SODIUM 40 MG IV SOLR
40.0000 mg | INTRAVENOUS | Status: DC
  Administered 2012-10-27 – 2012-11-01 (×6): 40 mg via INTRAVENOUS
  Filled 2012-10-27 (×8): qty 40

## 2012-10-27 MED ORDER — DEXTROSE-NACL 5-0.9 % IV SOLN
INTRAVENOUS | Status: DC
  Filled 2012-10-27 (×8): qty 1000

## 2012-10-27 MED ORDER — SODIUM CHLORIDE 0.9 % IV SOLN
INTRAVENOUS | Status: DC
  Administered 2012-10-27: 50 mL/h via INTRAVENOUS

## 2012-10-27 MED ORDER — PROMETHAZINE HCL 25 MG/ML IJ SOLN: 6.2500 mg | INTRAMUSCULAR | Status: DC | PRN

## 2012-10-27 MED ORDER — PHENYLEPHRINE HCL 10 MG/ML IJ SOLN
INTRAMUSCULAR | Status: DC | PRN
  Administered 2012-10-27: 120 ug via INTRAVENOUS

## 2012-10-27 MED ORDER — CEFAZOLIN SODIUM-DEXTROSE 2-3 GM-% IV SOLR
2.0000 g | Freq: Once | INTRAVENOUS | Status: AC
  Administered 2012-10-27 (×2): 2 g via INTRAVENOUS
  Filled 2012-10-27: qty 50

## 2012-10-27 MED ORDER — MIDAZOLAM HCL 2 MG/2ML IJ SOLN: 1.0000 mg | INTRAMUSCULAR | Status: DC | PRN

## 2012-10-27 MED ORDER — NOREPINEPHRINE BITARTRATE 1 MG/ML IJ SOLN
2.0000 ug/min | INTRAVENOUS | Status: DC
  Administered 2012-10-27: 3 ug/min via INTRAVENOUS
  Administered 2012-10-28: 10 ug/min via INTRAVENOUS
  Filled 2012-10-27 (×4): qty 4

## 2012-10-27 MED ORDER — FENTANYL BOLUS VIA INFUSION
25.0000 ug | Freq: Four times a day (QID) | INTRAVENOUS | Status: DC | PRN
  Filled 2012-10-27: qty 100

## 2012-10-27 MED ORDER — LEVOTHYROXINE SODIUM 100 MCG IV SOLR
25.0000 ug | Freq: Every day | INTRAVENOUS | Status: DC
  Administered 2012-10-27 – 2012-10-29 (×3): 26 ug via INTRAVENOUS
  Filled 2012-10-27 (×4): qty 1.3

## 2012-10-27 MED ORDER — MUPIROCIN 2 % EX OINT
1.0000 "application " | TOPICAL_OINTMENT | Freq: Two times a day (BID) | CUTANEOUS | Status: AC
  Administered 2012-10-27 – 2012-11-01 (×10): 1 via NASAL
  Filled 2012-10-27: qty 22

## 2012-10-27 MED ORDER — PROPOFOL 10 MG/ML IV BOLUS
INTRAVENOUS | Status: DC | PRN
  Administered 2012-10-27: 200 mg via INTRAVENOUS

## 2012-10-27 MED ORDER — MIDAZOLAM BOLUS VIA INFUSION
1.0000 mg | INTRAVENOUS | Status: DC | PRN
  Filled 2012-10-27: qty 2

## 2012-10-27 MED ORDER — SODIUM CHLORIDE 0.9 % IV SOLN
1.0000 mg/h | INTRAVENOUS | Status: DC
  Administered 2012-10-27: 2 mg/h via INTRAVENOUS
  Administered 2012-10-28 – 2012-10-29 (×2): 4 mg/h via INTRAVENOUS
  Filled 2012-10-27 (×6): qty 10

## 2012-10-27 MED ORDER — BIOTENE DRY MOUTH MT LIQD: 15.0000 mL | Freq: Four times a day (QID) | OROMUCOSAL | Status: DC

## 2012-10-27 MED ORDER — VANCOMYCIN HCL 1000 MG IV SOLR
2000.0000 mg | Freq: Once | INTRAVENOUS | Status: AC
  Administered 2012-10-27: 2000 mg via INTRAVENOUS
  Filled 2012-10-27: qty 2000

## 2012-10-27 MED ORDER — HEMOSTATIC AGENTS (NO CHARGE) OPTIME
TOPICAL | Status: DC | PRN
  Administered 2012-10-27: 1 via TOPICAL

## 2012-10-27 MED ORDER — FENTANYL CITRATE 0.05 MG/ML IJ SOLN: 50.0000 ug | Freq: Once | INTRAMUSCULAR | Status: DC

## 2012-10-27 MED ORDER — IPRATROPIUM-ALBUTEROL 18-103 MCG/ACT IN AERO
2.0000 | INHALATION_SPRAY | Freq: Four times a day (QID) | RESPIRATORY_TRACT | Status: DC | PRN
  Filled 2012-10-27: qty 14.7

## 2012-10-27 MED ORDER — VANCOMYCIN HCL 1000 MG IV SOLR
1500.0000 mg | INTRAVENOUS | Status: DC
  Administered 2012-10-28: 1500 mg via INTRAVENOUS
  Filled 2012-10-27 (×2): qty 1500

## 2012-10-27 MED ORDER — OXYCODONE HCL 5 MG/5ML PO SOLN: 5.0000 mg | Freq: Once | ORAL | Status: DC | PRN

## 2012-10-27 MED ORDER — DEXTROSE-NACL 5-0.9 % IV SOLN
INTRAVENOUS | Status: DC
  Administered 2012-10-27: 11:00:00 via INTRAVENOUS

## 2012-10-27 MED ORDER — ALBUTEROL SULFATE (5 MG/ML) 0.5% IN NEBU
2.5000 mg | INHALATION_SOLUTION | Freq: Four times a day (QID) | RESPIRATORY_TRACT | Status: DC
  Administered 2012-10-27: 2.5 mg via RESPIRATORY_TRACT
  Filled 2012-10-27: qty 0.5

## 2012-10-27 MED ORDER — CHLORHEXIDINE GLUCONATE CLOTH 2 % EX PADS
6.0000 | MEDICATED_PAD | Freq: Every day | CUTANEOUS | Status: AC
  Administered 2012-10-28 – 2012-10-31 (×4): 6 via TOPICAL

## 2012-10-27 MED ORDER — HYDROMORPHONE HCL PF 1 MG/ML IJ SOLN: 0.2500 mg | INTRAMUSCULAR | Status: DC | PRN

## 2012-10-27 MED ORDER — FENTANYL CITRATE 0.05 MG/ML IJ SOLN
INTRAMUSCULAR | Status: DC | PRN
  Administered 2012-10-27: 100 ug via INTRAVENOUS
  Administered 2012-10-27: 50 ug via INTRAVENOUS
  Administered 2012-10-27: 100 ug via INTRAVENOUS

## 2012-10-27 MED ORDER — DEXTROSE-NACL 5-0.9 % IV SOLN
INTRAVENOUS | Status: DC
  Filled 2012-10-27 (×3): qty 1000

## 2012-10-27 SURGICAL SUPPLY — 42 items
BLADE SURG ROTATE 9660 (MISCELLANEOUS) IMPLANT
CANISTER SUCTION 2500CC (MISCELLANEOUS) ×2 IMPLANT
CHLORAPREP W/TINT 26ML (MISCELLANEOUS) ×2 IMPLANT
CLOTH BEACON ORANGE TIMEOUT ST (SAFETY) ×2 IMPLANT
COVER SURGICAL LIGHT HANDLE (MISCELLANEOUS) ×2 IMPLANT
DRAPE LAPAROSCOPIC ABDOMINAL (DRAPES) ×2 IMPLANT
DRAPE UTILITY 15X26 W/TAPE STR (DRAPE) ×4 IMPLANT
DRAPE WARM FLUID 44X44 (DRAPE) ×2 IMPLANT
ELECT BLADE 6.5 EXT (BLADE) ×2 IMPLANT
ELECT REM PT RETURN 9FT ADLT (ELECTROSURGICAL) ×2
ELECTRODE REM PT RTRN 9FT ADLT (ELECTROSURGICAL) ×1 IMPLANT
GLOVE BIO SURGEON STRL SZ8 (GLOVE) ×2 IMPLANT
GLOVE BIOGEL PI IND STRL 7.5 (GLOVE) ×1 IMPLANT
GLOVE BIOGEL PI IND STRL 8 (GLOVE) ×1 IMPLANT
GLOVE BIOGEL PI INDICATOR 7.5 (GLOVE) ×1
GLOVE BIOGEL PI INDICATOR 8 (GLOVE) ×1
GLOVE ECLIPSE 7.5 STRL STRAW (GLOVE) ×6 IMPLANT
GOWN PREVENTION PLUS XLARGE (GOWN DISPOSABLE) ×2 IMPLANT
GOWN STRL NON-REIN LRG LVL3 (GOWN DISPOSABLE) ×4 IMPLANT
HEMOSTAT SNOW SURGICEL 2X4 (HEMOSTASIS) ×2 IMPLANT
KIT BASIN OR (CUSTOM PROCEDURE TRAY) ×2 IMPLANT
KIT ROOM TURNOVER OR (KITS) ×2 IMPLANT
LIGASURE IMPACT 36 18CM CVD LR (INSTRUMENTS) IMPLANT
NS IRRIG 1000ML POUR BTL (IV SOLUTION) ×4 IMPLANT
PACK GENERAL/GYN (CUSTOM PROCEDURE TRAY) ×2 IMPLANT
PAD ARMBOARD 7.5X6 YLW CONV (MISCELLANEOUS) ×4 IMPLANT
SPECIMEN JAR X LARGE (MISCELLANEOUS) IMPLANT
SPONGE ABDOMINAL VAC ABTHERA (MISCELLANEOUS) ×2 IMPLANT
SPONGE GAUZE 4X4 12PLY (GAUZE/BANDAGES/DRESSINGS) ×2 IMPLANT
SPONGE LAP 18X18 X RAY DECT (DISPOSABLE) IMPLANT
STAPLER VISISTAT 35W (STAPLE) ×2 IMPLANT
SUCTION POOLE TIP (SUCTIONS) ×2 IMPLANT
SUT PDS AB 1 TP1 96 (SUTURE) ×4 IMPLANT
SUT SILK 2 0 SH CR/8 (SUTURE) ×2 IMPLANT
SUT SILK 2 0 TIES 10X30 (SUTURE) ×2 IMPLANT
SUT SILK 3 0 SH CR/8 (SUTURE) ×2 IMPLANT
SUT SILK 3 0 TIES 10X30 (SUTURE) ×2 IMPLANT
TOWEL OR 17X24 6PK STRL BLUE (TOWEL DISPOSABLE) ×4 IMPLANT
TOWEL OR 17X26 10 PK STRL BLUE (TOWEL DISPOSABLE) ×2 IMPLANT
TRAY FOLEY CATH 14FRSI W/METER (CATHETERS) IMPLANT
WATER STERILE IRR 1000ML POUR (IV SOLUTION) ×2 IMPLANT
YANKAUER SUCT BULB TIP NO VENT (SUCTIONS) IMPLANT

## 2012-10-27 NOTE — Op Note (Signed)
10/26/2012 - 10/27/2012  4:00 PM  PATIENT:  Douglas Wiggins  46 y.o. male  PRE-OPERATIVE DIAGNOSIS:  abd compartment syndrome  POST-OPERATIVE DIAGNOSIS:  abd compartment syndrome, hemoperitoneum  PROCEDURE:  Procedure(s): EXPLORATORY LAPAROTOMY, CLOSURE WITH OPEN ABDOMEN VAC  SURGEON:  Violeta Gelinas, MD  PHYSICIAN ASSISTANT:   ASSISTANTS: Gaynelle Adu, MD  ANESTHESIA:   general  EBL:  Total I/O In: 300 [I.V.:250; IV Piggyback:50] Out: -   BLOOD ADMINISTERED:none  DRAINS: none   SPECIMEN:  No Specimen  DISPOSITION OF SPECIMEN:  N/A  COUNTS:  YES  DICTATION: Reubin Milan Dictation  Patient underwent colon resection and primary incisional hernia repair yesterday by Dr. [name].  This morning, his creatinine was significantly elevated and urine output decreased. His blood pressure was marginal. He was transferred to the intensive care unit. Bladder pressures were checked due to concern for abdominal compartment syndrome. Bladder pressures were significantly elevated at 37-41. He was emergently brought to the operating room for exploratory laparotomy due to this abdominal compartment syndrome. Informed consent was obtained from his mother. He was identified in the preop holding area. He received intravenous antibiotics. He was brought to the operating room. General endotracheal anesthesia was a Optician, dispensing by the anesthesia staff. During induction he aspirated some dark bilious vomitus. After intubation his endotracheal tube was thoroughly suctioned.  Abdomen was prepped and draped in sterile fashion. Time out procedure was performed. Staples were removed with a staple remover. The fascial sutures were gradually removed. There is noted to be some increased intra-abdominal pressure. There was hemoperitoneum present. There were clots and old dark blood.  This was evacuated. The source, best as we could determine, it was from the colon mesentery at the anastomosis. The anastomosis was viable and  intact. Small bowel was run from the ligament of Treitz to the terminal ileum and was without abnormality. No other bleeding source was noted. There is no active bleeding from the colonic mesentery, just localization of clot in that area. Abdomen was irrigated with multiple liters of warm saline. No active bleeding could be found. Some Surgicel snow was placed at the colon mesentery location. Nasogastric tube was confirmed in position. It was noted by anesthesia the patient was much easier to ventilate after opening his abdomen. Open abdomen VAC was applied in the standard fashion. First a fenestrated drape was tucked all the way around the bowel. Next to large blue sponges were placed. Next benzoin was placed on the skin and the VAC drapes were applied. VAC suction was hooked up and it held with no leaks. All counts were correct. Patient was remaining in the operating room for central line placement by anesthesia. He'll be taken directly back to the surgical intensive care unit on the ventilator. We will check stat lab work. I will also speak with the critical care service to assist in his care. He was transported to the intensive care unit intubated in critical condition.  PATIENT DISPOSITION:  ICU - intubated and critically ill.   Delay start of Pharmacological VTE agent (>24hrs) due to surgical blood loss or risk of bleeding:  yes  Violeta Gelinas, MD, MPH, FACS Pager: [phone]  11/26/20134:00 PM

## 2012-10-27 NOTE — Progress Notes (Signed)
ARDS protocol discontinued per MD. Pt placed on PRVC: VT 700, RR 16, Peep 10, FiO2 100%.

## 2012-10-27 NOTE — Progress Notes (Signed)
Patient ID: Douglas Wiggins, male   DOB: 07-28-1966, 46 y.o.   MRN: [id number] BMET with CRT 3.44.  Will repeat stat.  If truly elevated, will change IVF, transfer to ICU, ask renal to see,  and check bladder pressure to R/O ACS. I D/W patient. Violeta Gelinas, MD, MPH, FACS Pager: [phone]

## 2012-10-27 NOTE — Addendum Note (Signed)
Addendum  created 10/27/12 1850 by Margaree Mackintosh, CRNA   Modules edited:Anesthesia Medication Administration

## 2012-10-27 NOTE — Progress Notes (Signed)
eLink Physician-Brief Progress Note Patient Name: ESROM SOY DOB: 12-Jan-1966 MRN: [id number]  Date of Service  10/27/2012   HPI/Events of Note   Asked by CCS Dr [name] for PCCM help with this critically ill WM s/p open exploration for abdominal compartment syndrome  eICU Interventions  See orders. See full note to follow from PCCM   Intervention Category Major Interventions: Respiratory failure - evaluation and management  Shan Levans 10/27/2012, 5:32 PM

## 2012-10-27 NOTE — Progress Notes (Signed)
Patient ID: Douglas Wiggins, male   DOB: 1966/07/10, 46 y.o.   MRN: [id number] CRT remains elevated.  I changed IVF, D/Cd toradol, and adjusted Lovenox dose.  Will transfer to ICU and check bladder pressure, renal U/S and have renal service see him.  Plan D/W patient. Violeta Gelinas, MD, MPH, FACS Pager: [phone]

## 2012-10-27 NOTE — Progress Notes (Signed)
eLink Physician-Brief Progress Note Patient Name: Douglas Wiggins DOB: Feb 14, 1966 MRN: [id number]  Date of Service  10/27/2012   HPI/Events of Note   Pt with ALI on CXR  eICU Interventions  Start ARDS protocol   Intervention Category Major Interventions: Respiratory failure - evaluation and management  Shan Levans 10/27/2012, 7:25 PM

## 2012-10-27 NOTE — Progress Notes (Signed)
Pt started on ARDS protocol [id number].

## 2012-10-27 NOTE — Progress Notes (Signed)
1610 Patient output was  150 at 0500 no output since. Dr. Lindie Spruce notified and NS bolus order. Patient O2 sat 88% increase to 3l/m [place] at 0655.  NS bolus started. Will continue to monitor.

## 2012-10-27 NOTE — Consult Note (Signed)
PULMONARY  / CRITICAL CARE MEDICINE  Name: Douglas Wiggins MRN: [id number] DOB: 11/26/1966    LOS: 1  REFERRING MD:  [name])  CHIEF COMPLAINT:  Post op respiratory failure  BRIEF PATIENT DESCRIPTION: 46 yo admitted 11/24 for exploratory laparotomy, partial colectomy with revision of colon anastomosis and primary repair of ventral hernia on 11/25.  Course was complicated by hemoperitoneum and abdominal compartment syndrome with repeated laparotomy on 11/26.  Brought to ICU hypoxic, mechanically ventilated.  LINES / TUBES: L [place] CVL 11/26 >>> R rad A line 11/26 >>> Foley 11/25 >>> NGT 11/25 >>>  CULTURES: 11/26  Urine >>>  ANTIBIOTICS: Cefazolin 11/26 x 1 Cefoxitin 11/26 x 1 Primaxin 11/26 >>> Vancomycin 11/26 >>>  SIGNIFICANT EVENTS:  11/24  Admitted for elective surgery 11/25  Exploratory laparotomy, partial colectomy with revision of colon anastomosis and primary repair of ventral hernia 11/16  Repeated laparotomy for abdominal compartment syndrome, brought to ICU hypoxic, mechanically ventilated  LEVEL OF CARE:  ICU  PRIMARY SERVICE:  CCS  CONSULTANTS:  PCCM  CODE STATUS:  Full  DIET:  NPO  DVT Px:  SCDs  GI Px:  Protonix  The patient is encephalopathic and unable to provide history, which was obtained for available medical records.  HISTORY OF PRESENT ILLNESS:  46 yo admitted 11/24 for exploratory laparotomy, partial colectomy with revision of colon anastomosis and primary repair of ventral hernia on 11/25.  Course was complicated by hemoperitoneum and abdominal compartment syndrome with repeated laparotomy on 11/26.  Brought to ICU hypoxic, mechanically ventilated.  PAST MEDICAL HISTORY :  Past Medical History  Diagnosis Date  . Thyroid disease   . Depression   . Diverticulitis   . Pneumonia   . GERD (gastroesophageal reflux disease)   . Arthritis   . Hypertension     EKG 01/16/12 EPIC  . Sleep apnea     does not wear cpap/states MILD- no sleep  study in years  . Hypothyroidism   . Head injury, closed   . Blood in stool    Past Surgical History  Procedure Date  . Knee surgery   . Shoulder surgery   . Colostomy revision 01/06/2012    Procedure: COLON RESECTION SIGMOID;  Surgeon: [name]. [name], MD;  Location: [facility];  Service: General;  Laterality: N/A;  with hartmans procedure  . Ankle surgery     arthroscopy right with excision  and scrapping of bone  . Colon surgery 01/2012  . Colostomy closure 04/13/2012    Procedure: COLOSTOMY CLOSURE;  Surgeon: [name]. [name], MD;  Location: [facility];  Service: General;  Laterality: N/A;  . Carpal tunnel release   . Ventral hernia repair 10/26/2012    Procedure: HERNIA REPAIR VENTRAL ADULT;  Surgeon: [name]. [name], MD;  Location: [facility];  Service: General;  Laterality: N/A;  Primary Repair Ventral Hernia  . Laparotomy 10/26/2012    Procedure: EXPLORATORY LAPAROTOMY;  Surgeon: [name]. [name], MD;  Location: [facility] OR;  Service: General;  Laterality: N/A;  . Colon resection 10/26/2012    Procedure: COLON RESECTION;  Surgeon: [name]. [name], MD;  Location: [facility];  Service: General;  Laterality: N/A;  Partial colon resection with anastomosis   Prior to Admission medications   Medication Sig Start Date End Date Taking? Authorizing Provider  levothyroxine (SYNTHROID, LEVOTHROID) 75 MCG tablet Take 75 mcg by mouth daily with supper.    Yes Historical Provider, MD  losartan (COZAAR) 100 MG tablet 100 mg daily with  supper.  01/03/12  Yes Historical Provider, MD  oxyCODONE-acetaminophen (PERCOCET/ROXICET) 5-325 MG per tablet Take 1 tablet by mouth every 8 (eight) hours as needed.   Yes Historical Provider, MD  quinapril (ACCUPRIL) 20 MG tablet Take 20 mg by mouth daily.  01/03/12  Yes Historical Provider, MD  venlafaxine (EFFEXOR) 75 MG tablet Take 150 mg by mouth.  09/09/12  Yes Historical Provider, MD  meloxicam (MOBIC) 15 MG tablet Take 7.5 mg by mouth daily.     Historical Provider, MD  traMADol  (ULTRAM) 50 MG tablet Take 50 mg by mouth every 6 (six) hours as needed. For pain 08/13/12 08/13/13  [name]. Tsuei, MD  zolpidem (AMBIEN) 10 MG tablet Take 5-10 mg by mouth at bedtime as needed. For sleep    Historical Provider, MD   Allergies  Allergen Reactions  . Sulfa Antibiotics Hives and Rash    FAMILY HISTORY:  History reviewed. No pertinent family history. SOCIAL HISTORY:  reports that he has never smoked. He has never used smokeless tobacco. He reports that he does not drink alcohol or use illicit drugs.  REVIEW OF SYSTEMS:  Unable to provide.  INTERVAL HISTORY:  VITAL SIGNS: Temp:  [97.4 F (36.3 C)-98.5 F (36.9 C)] 98.4 F (36.9 C) (11/26 1957) Pulse Rate:  [73-120] 98  (11/26 2100) Resp:  [16-29] 22  (11/26 2100) BP: (75-129)/(39-70) 102/58 mmHg (11/26 2100) SpO2:  [89 %-100 %] 96 % (11/26 2100) Arterial Line BP: (75-118)/(39-55) 112/54 mmHg (11/26 2100) FiO2 (%):  [50 %-100 %] 50 % (11/26 2100)  HEMODYNAMICS: CVP:  [11 mmHg-15 mmHg] 15 mmHg  VENTILATOR SETTINGS: Vent Mode:  [-] PRVC FiO2 (%):  [50 %-100 %] 50 % Set Rate:  [18 bmp-22 bmp] 22 bmp Vt Set:  [610 mL-700 mL] 610 mL PEEP:  [8.2 cmH20-10 cmH20] 10 cmH20 Plateau Pressure:  [23 cmH20-26 cmH20] 23 cmH20  INTAKE / OUTPUT: Intake/Output      11/26 0701 - 11/27 0700   P.O.    I.V. (mL/kg) 2206.6 (18.4)   NG/GT 60   IV Piggyback 910   Total Intake(mL/kg) 3176.6 (26.6)   Urine (mL/kg/hr) 540 (0.3)   Drains 350   Blood 700   Total Output 1590   Net +1586.6        PHYSICAL EXAMINATION: General:  Appears acutely ill, mechanically ventilated, synchronous Neuro:  Encephalopathic, nonfocal, cough / gag diminished HEENT:  PERRL, OETT / NGT Cardiovascular:  RRR, no m/r/g Lungs:  Bilateral diminished air entry, no w/r/r Abdomen:  Wound-vac in place, surgical dressing intact Musculoskeletal:  Moves all extremities, no edema Skin:  Intact  LABS:  Lab 10/27/12 2033 10/27/12 1800 10/27/12 1724  10/27/12 1712 10/27/12 0915 10/27/12 0650 10/26/12 1541  HGB -- 9.9* -- -- -- 13.2 15.8  WBC -- 8.4 -- -- -- 18.9* 21.5*  PLT -- 207 -- -- -- 312 244  NA -- -- 134* -- 135 135 --  K -- -- 4.9 -- 5.4* -- --  CL -- -- 102 -- 99 100 --  CO2 -- -- 21 -- 23 20 --  GLUCOSE -- -- 123* -- 138* 138* --  BUN -- -- 32* -- 28* 26* --  CREATININE -- -- 3.62* -- 3.76* 3.44* --  CALCIUM -- -- 7.8* -- 8.7 8.8 --  MG -- -- -- -- -- -- --  PHOS -- -- -- -- -- -- --  AST -- -- -- -- -- -- --  ALT -- -- -- -- -- -- --  ALKPHOS -- -- -- -- -- -- --  BILITOT -- -- -- -- -- -- --  PROT -- -- -- -- -- -- --  ALBUMIN -- -- -- -- -- -- --  APTT -- -- -- -- -- -- --  INR -- -- -- -- -- -- --  LATICACIDVEN -- -- -- -- -- -- --  TROPONINI -- -- -- -- -- -- --  PROCALCITON -- -- -- -- -- -- --  PROBNP -- -- -- -- -- -- --  O2SATVEN -- -- -- -- -- -- --  PHART 7.343* -- -- 7.277* -- -- --  PCO2ART 35.4 -- -- 44.1 -- -- --  PO2ART 97.0 -- -- 63.0* -- -- --   No results found for this basename: GLUCAP:5 in the last 168 hours  IMAGING:  Dg Chest Port 1 View  10/27/2012  *RADIOLOGY REPORT*  Clinical Data: Intubated, line placement  PORTABLE CHEST - 1 VIEW  Comparison: 04/06/2012  Findings: Endotracheal and nasogastric tubes appropriately positioned.  Left subclavian central line tip terminates over the proximal SVC.  Heart size normal.  Lung volumes are low with crowding of the bronchovascular markings.  Retrocardiac probable atelectasis noted.  No pleural effusion.  No pneumothorax.  IMPRESSION: Appropriate support apparatus position as above.  Low volumes with probable bibasilar atelectasis.   Original Report Authenticated By: Christiana Pellant, M.D.    ECG:  Sinus tachycardia on monitor  DIAGNOSES: Active Problems:  Diverticulitis of colon with perforation  Hypertension  Hypothyroid  Obesity (BMI 30-39.9)  Ventral hernia  Left inguinal hernia  Acute respiratory failure with hypoxia  Encephalopathy  acute  Abdominal compartment syndrome  Shock  Hemoperitoneum  ASSESSMENT / PLAN:  PULMONARY  A:  Acute hypoxemic respiratory failure, likely secondary to atelectasis / FRC reduction related to abdominal compartment syndrome / morbid obesity.  Less likely ARDS / aspiration pneumonia. P:   Gaol SpO2>92, pH>7.30 Full mechanical support, would avoid ARDS protocol as already small lung volumes and no significant airspace disease.  Would use higher PEEP even when oxygenation improves. Daily SBT Trend ABG / CXR Albuterol PRN  CARDIOVASCULAR  A: Shock (hypovolemic, septic). P:  Goal MAP>60 Levophed gtt, titrate to off Trend lactate Hold Losartan, Accupril   RENAL  A:  AKI, likely secondary to compartment syndrome. P:   Goal CVP>10 Trend BMP IVF to [facility]  GASTROINTESTINAL  A:  S/p laparotomy x 2.  Abdominal compartment syndrome.  Hemoperitoneum. P:   NPO as intubated Surgery following  HEMATOLOGIC  A:  Acute blood loss anemia. P:  Trend CBC No indications for transfusion at this time  INFECTIOUS  A:  No evidence of acute infection. P:   Cultures and antibiotics as above per [facility] PCT  ENDOCRINE   A:  Hypothyroidism. P:   Synthroid IV  NEUROLOGIC  A:  Acute encephalopathy.  Post surgical pain. P:   Goal RASS 0 to -1 Fentanyl / Versed gtt  CLINICAL SUMMARY: 46 yo admitted 11/24 for exploratory laparotomy, partial colectomy with revision of colon anastomosis and primary repair of ventral hernia on 11/25.  Course was complicated by hemoperitoneum and abdominal compartment syndrome with repeated laparotomy on 11/26.  Brought to ICU hypoxic, mechanically ventilated. Decreased FRC/ atelectasis, less likely aspiration / ARDS.  Ventilate overnight, SBT in AM.  Minimum pressors, will attempt to wean off.  I have personally obtained a history, examined the patient, evaluated laboratory and imaging results, formulated the assessment and plan and placed orders.  CRITICAL  CARE:  The patient is critically ill with multiple organ systems failure and requires high complexity decision making for assessment and support, frequent evaluation and titration of therapies, application of advanced monitoring technologies and extensive interpretation of multiple databases. Critical Care Time devoted to patient care services described in this note is 40 minutes.   Lonia Farber, MD  Pulmonary and Critical Care Medicine [facility] Pager: [phone]  10/27/2012, 9:14 PM

## 2012-10-27 NOTE — Anesthesia Procedure Notes (Addendum)
Procedures

## 2012-10-27 NOTE — Consult Note (Addendum)
[facility] Renal Consultation Note  Requesting MD: Dr. [name] Indication for Consultation:  ARF  HPI:  Douglas Wiggins is a 46 y.o. male in with past medical history significant for hypertension, obstructive sleep apnea, thyroid disease as well as diverticulitis. Patient required an emergent resection due to sigmoid diverticulitis in February of 2013. He had Hartmann's  procedure and had his colostomy reversed in May 2013. As a result of these previous operations he had developed a ventral hernia as well as an inguinal hernia. On November 25 patient underwent inguinal hernia repair with an exploratory laparoscopy partial colectomy with anastomosis revision as well as repair of ventral hernia. Patient's preoperative creatinine was noted to be 0.92. His preoperative medications included Accupril, Mobic, and Cozaar. Patient's post operative creatinine was noted to be 3.44 and upon recheck was 3.76. Since his operation patient has been hypotensive with systolic blood pressures in the 90s.. In addition, he was receiving Toradol for pain control. Because the operation was so large, it was also felt prudent to check intra-abdominal pressures which were elevated. Therefore, patient was taken back to the OR today for decompression.  Patient has had urine output since his operation a total of 900+ today. Although is difficult to identify a trend as patient had switched hospital units and is now in the operating room.  A renal ultrasound has been ordered but not done as of yet due to the events above. A urinalysis has been ordered as well but not resulted.  Creatinine, Ser  Date/Time Value Range Status  10/27/2012  9:15 AM 3.76* 0.50 - 1.35 mg/dL Final  16/09/9603  5:40 AM 3.44* 0.50 - 1.35 mg/dL Final     REPEATED TO VERIFY     DELTA CHECK NOTED     RESULT CALLED TO, READ BACK BY AND VERIFIED WITH:     Edyth Gunnels 0840 10/27/12 CLARK,S  10/26/2012  3:41 PM 0.92  0.50 - 1.35 mg/dL Final    98/10/9146  8:29 AM 0.94  0.50 - 1.35 mg/dL Final  5/62/1308  6:57 AM 1.13  0.50 - 1.35 mg/dL Final  8/46/9629  5:28 PM 0.95  0.50 - 1.35 mg/dL Final  03/15/2439  1:02 AM 1.11  0.50 - 1.35 mg/dL Final  06/26/3663  4:03 AM 1.21  0.50 - 1.35 mg/dL Final  4/74/2595  6:38 PM 0.89  0.50 - 1.35 mg/dL Final  06/05/6432  2:95 PM 1.20  0.50 - 1.35 mg/dL Final  12/09/8414  6:06 AM 0.90  0.50 - 1.35 mg/dL Final  3/0/1601  0:93 AM 1.31  0.50 - 1.35 mg/dL Final  01/05/5572  2:20 PM 1.27  0.50 - 1.35 mg/dL Final  25/42/7062  3:76 PM 1.21  0.4 - 1.5 mg/dL Final  2/83/1517 61:60 PM 1.11  0.4 - 1.5 mg/dL Final  06/03/7105  2:69 PM 1.01  0.4 - 1.5 mg/dL Final  48/54/6270 35:00 AM 1.12   Final     PMHx:   Past Medical History  Diagnosis Date  . Thyroid disease   . Depression   . Diverticulitis   . Pneumonia   . GERD (gastroesophageal reflux disease)   . Arthritis   . Hypertension     EKG 01/16/12 EPIC  . Sleep apnea     does not wear cpap/states MILD- no sleep study in years  . Hypothyroidism   . Head injury, closed   . Blood in stool     Past Surgical History  Procedure Date  . Knee surgery   .  Shoulder surgery   . Colostomy revision 01/06/2012    Procedure: COLON RESECTION SIGMOID;  Surgeon: [name]. [name], MD;  Location: [facility];  Service: General;  Laterality: N/A;  with hartmans procedure  . Ankle surgery     arthroscopy right with excision  and scrapping of bone  . Colon surgery 01/2012  . Colostomy closure 04/13/2012    Procedure: COLOSTOMY CLOSURE;  Surgeon: [name]. [name], MD;  Location: [facility];  Service: General;  Laterality: N/A;  . Carpal tunnel release   . Ventral hernia repair 10/26/2012    Procedure: HERNIA REPAIR VENTRAL ADULT;  Surgeon: [name]. [name], MD;  Location: [facility] OR;  Service: General;  Laterality: N/A;  Primary Repair Ventral Hernia  . Laparotomy 10/26/2012    Procedure: EXPLORATORY LAPAROTOMY;  Surgeon: [name]. [name], MD;  Location: [facility] OR;  Service: General;   Laterality: N/A;  . Colon resection 10/26/2012    Procedure: COLON RESECTION;  Surgeon: [name]. [name], MD;  Location: [facility] OR;  Service: General;  Laterality: N/A;  Partial colon resection with anastomosis    Family Hx: History reviewed. No pertinent family history.  Social History:  reports that he has never smoked. He has never used smokeless tobacco. He reports that he does not drink alcohol or use illicit drugs.  Allergies:  Allergies  Allergen Reactions  . Sulfa Antibiotics Hives and Rash    Medications: Prior to Admission medications   Medication Sig Start Date End Date Taking? Authorizing Provider  levothyroxine (SYNTHROID, LEVOTHROID) 75 MCG tablet Take 75 mcg by mouth daily with supper.    Yes Historical Provider, MD  losartan (COZAAR) 100 MG tablet 100 mg daily with supper.  01/03/12  Yes Historical Provider, MD  oxyCODONE-acetaminophen (PERCOCET/ROXICET) 5-325 MG per tablet Take 1 tablet by mouth every 8 (eight) hours as needed.   Yes Historical Provider, MD  quinapril (ACCUPRIL) 20 MG tablet Take 20 mg by mouth daily.  01/03/12  Yes Historical Provider, MD  venlafaxine (EFFEXOR) 75 MG tablet Take 150 mg by mouth.  09/09/12  Yes Historical Provider, MD  meloxicam (MOBIC) 15 MG tablet Take 7.5 mg by mouth daily.     Historical Provider, MD  traMADol (ULTRAM) 50 MG tablet Take 50 mg by mouth every 6 (six) hours as needed. For pain 08/13/12 08/13/13  [name]. Tsuei, MD  zolpidem (AMBIEN) 10 MG tablet Take 5-10 mg by mouth at bedtime as needed. For sleep    Historical Provider, MD    I have reviewed the patient's current medications.  Labs:  Results for orders placed during the hospital encounter of 10/26/12 (from the past 48 hour(s))  CBC     Status: Abnormal   Collection Time   10/26/12  3:41 PM      Component Value Range Comment   WBC 21.5 (*) 4.0 - 10.5 K/uL    RBC 5.23  4.22 - 5.81 MIL/uL    Hemoglobin 15.8  13.0 - 17.0 g/dL    HCT 16.1  09.6 - 04.5 %    MCV 86.2  78.0  - 100.0 fL    MCH 30.2  26.0 - 34.0 pg    MCHC 35.0  30.0 - 36.0 g/dL    RDW 40.9  81.1 - 91.4 %    Platelets 244  150 - 400 K/uL   CREATININE, SERUM     Status: Normal   Collection Time   10/26/12  3:41 PM      Component Value Range Comment  Creatinine, Ser 0.92  0.50 - 1.35 mg/dL    GFR calc non Af Amer >90  >90 mL/min    GFR calc Af Amer >90  >90 mL/min   CBC     Status: Abnormal   Collection Time   10/27/12  6:50 AM      Component Value Range Comment   WBC 18.9 (*) 4.0 - 10.5 K/uL    RBC 4.41  4.22 - 5.81 MIL/uL    Hemoglobin 13.2  13.0 - 17.0 g/dL    HCT 91.4 (*) 78.2 - 52.0 %    MCV 88.0  78.0 - 100.0 fL    MCH 29.9  26.0 - 34.0 pg    MCHC 34.0  30.0 - 36.0 g/dL    RDW 95.6  21.3 - 08.6 %    Platelets 312  150 - 400 K/uL   BASIC METABOLIC PANEL     Status: Abnormal   Collection Time   10/27/12  6:50 AM      Component Value Range Comment   Sodium 135  135 - 145 mEq/L    Potassium 5.5 (*) 3.5 - 5.1 mEq/L    Chloride 100  96 - 112 mEq/L    CO2 20  19 - 32 mEq/L    Glucose, Bld 138 (*) 70 - 99 mg/dL    BUN 26 (*) 6 - 23 mg/dL    Creatinine, Ser 5.78 (*) 0.50 - 1.35 mg/dL    Calcium 8.8  8.4 - 46.9 mg/dL    GFR calc non Af Amer 20 (*) >90 mL/min    GFR calc Af Amer 23 (*) >90 mL/min   BASIC METABOLIC PANEL     Status: Abnormal   Collection Time   10/27/12  9:15 AM      Component Value Range Comment   Sodium 135  135 - 145 mEq/L    Potassium 5.4 (*) 3.5 - 5.1 mEq/L    Chloride 99  96 - 112 mEq/L    CO2 23  19 - 32 mEq/L    Glucose, Bld 138 (*) 70 - 99 mg/dL    BUN 28 (*) 6 - 23 mg/dL    Creatinine, Ser 6.29 (*) 0.50 - 1.35 mg/dL    Calcium 8.7  8.4 - 52.8 mg/dL    GFR calc non Af Amer 18 (*) >90 mL/min    GFR calc Af Amer 21 (*) >90 mL/min      ROS:  Review of systems not obtained due to patient factors. patient is currently intubated.  Physical Exam: Filed Vitals:   10/27/12 1317  BP:   Pulse:   Temp:   Resp: 25     Patient has not been seen yet  as is currently in the OR, I will amend note when I can examine.    Exam by Casimiro Needle, MD at 9:55PM General:Intubated and sedated CVP 13-15, BP 84/60 on Levophed HEENT:hyperpigmentaion in the periorbital areas without ecchymosis Neck:no JVD Heart:RRR grade 3/6 holosystolic murmer loudest at apex, no rub Lungs: few rhonchi bilaterally Abdomen: post op with Vac device and tender Extremities: no edema Neuro: sedated   Assessment/Plan: 46 year old male with complicated surgical history as described above. He has acute renal failure after surgery. 1.Renal- acute kidney injury temporally associated with surgery. The possibilities for etiology include hemodynamic changes secondary to hypotension especially on previous ACE and ARB therapy. He also apparently had NSAIDs on board and was given Toradol postop which could lead to some kidney  injury as well. A mechanical compression issue is less likely but is a consideration given the extensive nature of the surgery and the elevated intra-abdominal pressures afterwards. I agree with taking patient back to OR to attempt to alleviate intra-abdominal pressures. I also agree with holding ACE and ARB and all NSAIDs. Hopefully since patient is young and had very good kidney function at baseline, this will be easily reversible.  The fact that he still has reasonable urine output is good sign as well. Patient's potassium is slightly high which could become an issue. Labs will be rechecked when he comes back from the operating room today and will be dealt with. 2. Hypertension/volume  - patient is currently being hydrated. Since he is already on the ventilator it may be okay to continue.  Will also obtain aa 2D Echo given impressive cardiac murmur an low BP 3. Anemia  - hemoglobin did drop in the setting of surgery. However, was still over 13. This will continue to be followed.  Thank you for this consultation, I will follow with you   GOLDSBOROUGH,KELLIE  A 10/27/2012, 2:58 PM  Addendum by Lauris Poag

## 2012-10-27 NOTE — Progress Notes (Signed)
Pt urine output still not improved. Dr. [name] notified. MD called with critical creatinine level of 3.44 from previous days 0.96. MD ordered STAT redraw with results in. At that point patient blood pressure had no improved from bolus and Rn called Rapid response. Patient alert and oriented at this point. Rapid response nurse at bedside

## 2012-10-27 NOTE — Progress Notes (Addendum)
1 Day Post-Op  Subjective: Very sore, Dr. Lindie Spruce ordered a 500cc bolus for decreased U/O this AM  Objective: Vital signs in last 24 hours: Temp:  [97.1 F (36.2 C)-98.5 F (36.9 C)] 98.5 F (36.9 C) (11/26 0625) Pulse Rate:  [70-123] 100  (11/26 0625) Resp:  [16-28] 16  (11/26 0625) BP: (91-137)/(59-82) 103/70 mmHg (11/26 0625) SpO2:  [92 %-100 %] 95 % (11/26 0625) FiO2 (%):  [2 %] 2 % (11/25 2007) Weight:  [119.614 kg (263 lb 11.2 oz)] 119.614 kg (263 lb 11.2 oz) (11/25 1425)    Intake/Output from previous day: 11/25 0701 - 11/26 0700 In: 4192.9 [P.O.:120; I.V.:3922.9; IV Piggyback:150] Out: 1040 [Urine:940; Blood:100] Intake/Output this shift:    General appearance: alert and cooperative Resp: clear to auscultation bilaterally Cardio: regular rate and rhythm GI: dressing CDI, a few BS, sore but no generalized tenderness  Lab Results:   Basename 10/26/12 1541  WBC 21.5*  HGB 15.8  HCT 45.1  PLT 244   BMET  Basename 10/26/12 1541  NA --  K --  CL --  CO2 --  GLUCOSE --  BUN --  CREATININE 0.92  CALCIUM --   PT/INR No results found for this basename: LABPROT:2,INR:2 in the last 72 hours ABG No results found for this basename: PHART:2,PCO2:2,PO2:2,HCO3:2 in the last 72 hours  Studies/Results: No results found.  Anti-infectives: Anti-infectives     Start     Dose/Rate Route Frequency Ordered Stop   10/26/12 1600   cefOXitin (MEFOXIN) 1 g in dextrose 5 % 50 mL IVPB        1 g 100 mL/hr over 30 Minutes Intravenous Every 6 hours 10/26/12 1517 10/27/12 0417   10/26/12 0600   ceFAZolin (ANCEF) 3 g in dextrose 5 % 50 mL IVPB        3 g 160 mL/hr over 30 Minutes Intravenous On call to O.R. 10/25/12 1337 10/26/12 0915          Assessment/Plan: s/p Procedure(s) (LRB) with comments: HERNIA REPAIR VENTRAL ADULT (N/A) - Primary Repair Ventral Hernia EXPLORATORY LAPAROTOMY (N/A) COLON RESECTION (N/A) - Partial colon resection with anastomosis S/P colon  resection and primary repair incisional hernia POD #1 Await return bowel function, on clears HTN - on Cozaar and Accupril but SBP 90s so will hold both today Mobilize Resp - some splinting due to pain, IS and pulmonary toilet, add BDs and RT VTE - Lovenox Check pending labs  LOS: 1 day    Santino Kinsella E 10/27/2012

## 2012-10-27 NOTE — Anesthesia Postprocedure Evaluation (Signed)
  Anesthesia Post-op Note  Patient: Douglas Wiggins  Procedure(s) Performed: Procedure(s) (LRB) with comments: EXPLORATORY LAPAROTOMY (N/A)  Patient Location: SICU  Anesthesia Type:General  Level of Consciousness: sedated and Patient remains intubated per anesthesia plan  Airway and Oxygen Therapy: Patient remains intubated per anesthesia plan and Patient placed on Ventilator (see vital sign flow sheet for setting)  Post-op Pain: none  Post-op Assessment: Post-op Vital signs reviewed and Patient's Cardiovascular Status Stable  Post-op Vital Signs: stable  Complications: No apparent anesthesia complications

## 2012-10-27 NOTE — Care Management Note (Signed)
    Page 1 of 1   10/27/2012     1:22:02 PM   CARE MANAGEMENT NOTE 10/27/2012  Patient:  Douglas Wiggins, Douglas Wiggins   Account Number:  [id number]  Date Initiated:  10/27/2012  Documentation initiated by:  [facility]  Subjective/Objective Assessment:   Ventral hernia repair with post op abd compartment syndrome requiring emergent return to surgery.     Action/Plan:   Anticipated DC Date:  11/03/2012   Anticipated DC Plan:  HOME/SELF CARE      DC Planning Services  CM consult      Choice offered to / List presented to:             Status of service:  In process, will continue to follow Medicare Important Message given?   (If response is "NO", the following Medicare IM given date fields will be blank) Date Medicare IM given:   Date Additional Medicare IM given:    Discharge Disposition:    Per UR Regulation:  Reviewed for med. necessity/level of care/duration of stay  If discussed at Long Length of Stay Meetings, dates discussed:    Comments:  ContactTrelon, [place] Father [phone]

## 2012-10-27 NOTE — Progress Notes (Signed)
Patient ID: Douglas Wiggins, male   DOB: 05-Oct-1966, 46 y.o.   MRN: [id number] Per patient request I called his mother to update her on the ongoing issues. Violeta Gelinas, MD, MPH, FACS Pager: [phone]

## 2012-10-27 NOTE — Progress Notes (Signed)
ANTIBIOTIC CONSULT NOTE - INITIAL  Pharmacy Consult for Primaxin, Vancomycin Indication: empiric coverage for abdominal source  Allergies  Allergen Reactions  . Sulfa Antibiotics Hives and Rash    Patient Measurements: Height: 6\' 4"  (193 cm) Weight: 263 lb 11.2 oz (119.614 kg) IBW/kg (Calculated) : 86.8   Labs:  Basename 10/27/12 0915 10/27/12 0650 10/26/12 1541  WBC -- 18.9* 21.5*  HGB -- 13.2 15.8  PLT -- 312 244  LABCREA -- -- --  CREATININE 3.76* 3.44* 0.92   Estimated Creatinine Clearance: 35.4 ml/min (by C-G formula based on Cr of 3.76). No results found for this basename: VANCOTROUGH:2,VANCOPEAK:2,VANCORANDOM:2,GENTTROUGH:2,GENTPEAK:2,GENTRANDOM:2,TOBRATROUGH:2,TOBRAPEAK:2,TOBRARND:2,AMIKACINPEAK:2,AMIKACINTROU:2,AMIKACIN:2, in the last 72 hours   Microbiology: Recent Results (from the past 720 hour(s))  SURGICAL PCR SCREEN     Status: Abnormal   Collection Time   10/19/12  9:28 AM      Component Value Range Status Comment   MRSA, PCR NEGATIVE  NEGATIVE Final    Staphylococcus aureus POSITIVE (*) NEGATIVE Final     Medical History: Past Medical History  Diagnosis Date  . Thyroid disease   . Depression   . Diverticulitis   . Pneumonia   . GERD (gastroesophageal reflux disease)   . Arthritis   . Hypertension     EKG 01/16/12 EPIC  . Sleep apnea     does not wear cpap/states MILD- no sleep study in years  . Hypothyroidism   . Head injury, closed   . Blood in stool    Assessment: This is a 46 year old male who is status post emergent resection of sigmoid diverticulitis in February 2013. He had a Hartman's procedure and have his colostomy reversed on 04/13/12. At that time he did have a peristomal hernia which was primarily repaired. He has developed a midline ventral hernia at his previous incision.  S/p open ventral hernia repair / colon resection with anastomosis 11.25.13  Now with abdominal compartment syndrome, s/p emergent exploration with open abdomen  VAC  Now with renal insufficiency.  Baseline Scr=0.92, today scr=3.76 with estimated CrCl of 35 ml / min  Now intubated and critically ill - beginning broad spectrum antibiotics  Goal of Therapy:  Vancomycin trough level = 15 to 20 mcg/dl Appropriate Primaxin dosing  Plan:  1) Vancomycin 2000 mg iv x 1 dose tonight, then Vancomycin 1500 mg iv Q 24 hours 2) Primaxin 250 mg iv Q 6 hours 3) Follow up cultures, renal function, progress  Thank you. Okey Regal, PharmD [phone]  10/27/2012,5:47 PM

## 2012-10-27 NOTE — Preoperative (Signed)
Beta Blockers   Reason not to administer Beta Blockers:Not Applicable 

## 2012-10-27 NOTE — Transfer of Care (Signed)
Immediate Anesthesia Transfer of Care Note  Patient: Douglas Wiggins  Procedure(s) Performed: Procedure(s) (LRB) with comments: EXPLORATORY LAPAROTOMY (N/A)  Patient Location: ICU 2310  Anesthesia Type:General  Level of Consciousness: sedated  Airway & Oxygen Therapy: Patient remains intubated per anesthesia plan and Patient placed on Ventilator (see vital sign flow sheet for setting)  Post-op Assessment: Report given to PACU RN and Post -op Vital signs reviewed and stable  Post vital signs: Reviewed and stable  Complications: aspiration on induction

## 2012-10-27 NOTE — Anesthesia Preprocedure Evaluation (Signed)
Anesthesia Evaluation  Patient identified by MRN, date of birth, ID band Patient awake    Reviewed: Allergy & Precautions, H&P , NPO status , Patient's Chart, lab work & pertinent test results  Airway Mallampati: I TM Distance: >3 FB Neck ROM: full    Dental  (+) Teeth Intact   Pulmonary sleep apnea ,  breath sounds clear to auscultation        Cardiovascular hypertension, Pt. on medications Rhythm:regular Rate:Tachycardia     Neuro/Psych    GI/Hepatic GERD-  ,  Endo/Other  Hypothyroidism   Renal/GU ARFRenal disease     Musculoskeletal   Abdominal (+) + obese,   Peds  Hematology   Anesthesia Other Findings   Reproductive/Obstetrics                           Anesthesia Physical Anesthesia Plan  ASA: III and emergent  Anesthesia Plan: General   Post-op Pain Management:    Induction: Intravenous  Airway Management Planned: Oral ETT  Additional Equipment:   Intra-op Plan:   Post-operative Plan: Post-operative intubation/ventilation  Informed Consent: I have reviewed the patients History and Physical, chart, labs and discussed the procedure including the risks, benefits and alternatives for the proposed anesthesia with the patient or authorized representative who has indicated his/her understanding and acceptance.     Plan Discussed with: CRNA and Surgeon  Anesthesia Plan Comments:         Anesthesia Quick Evaluation

## 2012-10-27 NOTE — Progress Notes (Signed)
Patient ID: Douglas Wiggins, male   DOB: 06-11-1966, 46 y.o.   MRN: [id number] Abdominal pressure 37-41.  This is consistent with abdominal compartment syndrome.  Will take to OR for emergent exploration with plan to leave open with open abdomen VAC.  I spoke to him and his mother regarding the plan including the risks and benefits of the procedure.  They agree. Violeta Gelinas, MD, MPH, FACS Pager: [phone]

## 2012-10-28 ENCOUNTER — Inpatient Hospital Stay (HOSPITAL_COMMUNITY): Payer: BC Managed Care – PPO

## 2012-10-28 DIAGNOSIS — J95821 Acute postprocedural respiratory failure: Secondary | ICD-10-CM

## 2012-10-28 DIAGNOSIS — Z9889 Other specified postprocedural states: Secondary | ICD-10-CM

## 2012-10-28 DIAGNOSIS — N289 Disorder of kidney and ureter, unspecified: Secondary | ICD-10-CM

## 2012-10-28 LAB — COMPREHENSIVE METABOLIC PANEL
AST: 608 U/L — ABNORMAL HIGH (ref 0–37)
BUN: 35 mg/dL — ABNORMAL HIGH (ref 6–23)
CO2: 21 mEq/L (ref 19–32)
Chloride: 104 mEq/L (ref 96–112)
Creatinine, Ser: 3.18 mg/dL — ABNORMAL HIGH (ref 0.50–1.35)
GFR calc non Af Amer: 22 mL/min — ABNORMAL LOW (ref >90)
Glucose, Bld: 147 mg/dL — ABNORMAL HIGH (ref 70–99)
Total Bilirubin: 0.4 mg/dL (ref 0.3–1.2)

## 2012-10-28 LAB — POCT I-STAT 3, ART BLOOD GAS (G3+)
Acid-base deficit: 4 mmol/L — ABNORMAL HIGH (ref 0.0–2.0)
Bicarbonate: 21.5 mEq/L (ref 20.0–24.0)
Bicarbonate: 21.6 mEq/L (ref 20.0–24.0)
O2 Saturation: 100 %
Patient temperature: 99.2
TCO2: 23 mmol/L (ref 0–100)
TCO2: 23 mmol/L (ref 0–100)
pCO2 arterial: 39.7 mmHg (ref 35.0–45.0)
pCO2 arterial: 38.3 mmHg (ref 35.0–45.0)
pH, Arterial: 7.36 (ref 7.350–7.450)
pO2, Arterial: 193 mmHg — ABNORMAL HIGH (ref 80.0–100.0)
pO2, Arterial: 115 mmHg — ABNORMAL HIGH (ref 80.0–100.0)

## 2012-10-28 LAB — PROCALCITONIN: Procalcitonin: 2.35 ng/mL

## 2012-10-28 LAB — CBC
HCT: 26.3 % — ABNORMAL LOW (ref 39.0–52.0)
Hemoglobin: 9 g/dL — ABNORMAL LOW (ref 13.0–17.0)
MCHC: 34.2 g/dL (ref 30.0–36.0)
RBC: 3.04 MIL/uL — ABNORMAL LOW (ref 4.22–5.81)

## 2012-10-28 LAB — LACTIC ACID, PLASMA: Lactic Acid, Venous: 1.2 mmol/L (ref 0.5–2.2)

## 2012-10-28 MED ORDER — SODIUM CHLORIDE 0.9 % IV SOLN
500.0000 mg | Freq: Three times a day (TID) | INTRAVENOUS | Status: DC
  Administered 2012-10-28 – 2012-10-29 (×3): 500 mg via INTRAVENOUS
  Filled 2012-10-28 (×5): qty 500

## 2012-10-28 MED ORDER — CHLORHEXIDINE GLUCONATE 0.12 % MT SOLN
OROMUCOSAL | Status: AC
  Administered 2012-10-28: 15 mL
  Filled 2012-10-28: qty 15

## 2012-10-28 MED ORDER — CHLORHEXIDINE GLUCONATE 0.12 % MT SOLN
15.0000 mL | Freq: Two times a day (BID) | OROMUCOSAL | Status: DC
  Administered 2012-10-28 – 2012-10-31 (×8): 15 mL via OROMUCOSAL
  Filled 2012-10-28 (×5): qty 15

## 2012-10-28 NOTE — Progress Notes (Signed)
MD adjusted peep, fio2, rr and alarms.

## 2012-10-28 NOTE — Progress Notes (Signed)
INITIAL ADULT NUTRITION ASSESSMENT Date: 10/28/2012   Time: 12:15 PM  Reason for Assessment: VDRF  INTERVENTION: 1. If patient not extubated, consider initiating nutrition support. If able to tolerate EN, initiate Oxepa at 20 ml/hr. Increase by 10 ml/hr every 4 hours to goal rate of 40 ml/hr with 30 ml Prostat TID to provide 1740 kcal, 105 g protein, and 754 ml free water.  2. RD to follow plan of care.   ASSESSMENT: Male 46 y.o.  Dx: Ventral hernia  Hx:  Past Medical History  Diagnosis Date  . Thyroid disease   . Depression   . Diverticulitis   . Pneumonia   . GERD (gastroesophageal reflux disease)   . Arthritis   . Hypertension     EKG 01/16/12 EPIC  . Sleep apnea     does not wear cpap/states MILD- no sleep study in years  . Hypothyroidism   . Head injury, closed   . Blood in stool     Related Meds:     . antiseptic oral rinse  15 mL Mouth Rinse q12n4p  . [COMPLETED]  ceFAZolin (ANCEF) IV  2 g Intravenous Once  . chlorhexidine  15 mL Mouth/Throat BID  . [COMPLETED] chlorhexidine      . Chlorhexidine Gluconate Cloth  6 each Topical Daily  . [COMPLETED] dexmedetomidine  0.4-1.2 mcg/kg/hr Intravenous Once  . imipenem-cilastatin  250 mg Intravenous Q6H  . levothyroxine  26 mcg Intravenous Daily  . mupirocin ointment  1 application Nasal BID  . pantoprazole (PROTONIX) IV  40 mg Intravenous Q24H  . vancomycin  1,500 mg Intravenous Q24H  . [COMPLETED] vancomycin  2,000 mg Intravenous Once  . [DISCONTINUED] albuterol  2.5 mg Nebulization Q6H  . [DISCONTINUED] antiseptic oral rinse  15 mL Mouth Rinse QID  . [DISCONTINUED] chlorhexidine  15 mL Mouth Rinse BID  . [DISCONTINUED] enoxaparin  30 mg Subcutaneous Q24H  . [DISCONTINUED] enoxaparin  40 mg Subcutaneous Q24H  . [DISCONTINUED] fentaNYL  50-100 mcg Intravenous Once  . [DISCONTINUED] HYDROmorphone PCA 0.3 mg/mL   Intravenous Q4H  . [DISCONTINUED] ketorolac  15 mg Intravenous Q6H  . [DISCONTINUED] levothyroxine  75  mcg Oral Q supper  . [DISCONTINUED] venlafaxine  150 mg Oral BID WC    Ht: 6\' 4"  (193 cm)  Wt: 253 lb 15.5 oz (115.2 kg)  Ideal Wt: 91.8 kg % Ideal Wt: 125%  Usual Wt: Unknown % Usual Wt: Unknown  Body mass index is 30.91 kg/(m^2). Patient is obese.   Food/Nutrition Related Hx: History of emergent resection of sigmoid diverticulitis in February 2013.   Labs:  CMP     Component Value Date/Time   NA 136 10/28/2012 0410   K 4.8 10/28/2012 0410   CL 104 10/28/2012 0410   CO2 21 10/28/2012 0410   GLUCOSE 147* 10/28/2012 0410   BUN 35* 10/28/2012 0410   CREATININE 3.18* 10/28/2012 0410   CALCIUM 7.8* 10/28/2012 0410   PROT 5.3* 10/28/2012 0410   ALBUMIN 2.6* 10/28/2012 0410   AST 608* 10/28/2012 0410   ALT 819* 10/28/2012 0410   ALKPHOS 54 10/28/2012 0410   BILITOT 0.4 10/28/2012 0410   GFRNONAA 22* 10/28/2012 0410   GFRAA 26* 10/28/2012 0410    Intake/Output Summary (Last 24 hours) at 10/28/12 1218 Last data filed at 10/28/12 1000  Gross per 24 hour  Intake 5366.43 ml  Output   3045 ml  Net 2321.43 ml    Diet Order:  NPO  Supplements/Tube Feeding: None  IVF:  dextrose 5 % and 0.9% NaCl 1,000 mL infusion Last Rate: 20 mL/hr at 10/28/12 1000  fentaNYL infusion INTRAVENOUS Last Rate: 200 mcg/hr (10/28/12 1000)  midazolam (VERSED) infusion Last Rate: 3 mg/hr (10/28/12 1000)  norepinephrine (LEVOPHED) Adult infusion Last Rate: 8 mcg/min (10/28/12 1000)  [DISCONTINUED] sodium chloride Last Rate: Stopped (10/27/12 1445)  [DISCONTINUED] dextrose 5 % and 0.45 % NaCl with KCl 20 mEq/L Last Rate: Stopped (10/27/12 1115)  [DISCONTINUED] dextrose 5 % and 0.9% NaCl 1,000 mL infusion Last Rate: 125 mL/hr at 10/27/12 1749  [DISCONTINUED] dextrose 5 % and 0.9% NaCl Last Rate: 125 mL/hr at 10/27/12 1121   Temp: 37.7 MV: 11.1  Estimated Nutritional Needs:   Kcal: 2489 kcal (1500-1750 kcal) Protein: 95-105 g Fluid: >2.7 L  Patient admitted with ventral hernia s/p  repair with post-op respiratory failure. He had an emergent resection of sigmoid diverticulitis in February 2013 with colostomy reversed May 2013. He remains intubated. Plan to return to OR tomorrow. Patient also with acute kidney injury.   NUTRITION DIAGNOSIS: -Inadequate oral intake (NI-2.1).  Status: Ongoing  RELATED TO: inability to eat  AS EVIDENCE BY: NPO status  MONITORING/EVALUATION(Goals): Enteral nutrition to provide 60-70% of estimated calorie needs (22-25 kcals/kg ideal body weight) and >/= 90% of estimated protein needs, based on ASPEN guidelines for permissive underfeeding in critically ill obese individuals.  Monitor: Respiratory status, diet advance or initiation of nutrition support, weight, labs   EDUCATION NEEDS: -No education needs identified at this time   [name], RD, LDN Pager #: [phone] After-Hours Pager #: [phone]   DOCUMENTATION CODES Per approved criteria  -Obesity Unspecified    Douglas Wiggins 10/28/2012, 12:15 PM

## 2012-10-28 NOTE — Progress Notes (Signed)
ANTIBIOTIC CONSULT NOTE - FOLLOW UP  Pharmacy Consult for Imipenem, Vancomycin Indication: empiric for abdominal coverage  Allergies  Allergen Reactions  . Sulfa Antibiotics Hives and Rash    Patient Measurements: Height: 6\' 4"  (193 cm) Weight: 253 lb 15.5 oz (115.2 kg) IBW/kg (Calculated) : 86.8   Vital Signs: Temp: 99.3 F (37.4 C) (11/27 1148) Temp src: Oral (11/27 1148) BP: 100/47 mmHg (11/27 1215) Pulse Rate: 107  (11/27 1215) Intake/Output from previous day: 11/26 0701 - 11/27 0700 In: 4938.6 [I.V.:3708.6; NG/GT:120; IV Piggyback:1110] Out: 2775 [Urine:1275; Drains:800; Blood:700] Intake/Output from this shift: Total I/O In: 698.8 [I.V.:698.8] Out: 490 [Urine:365; Drains:125]  Labs:  [facility] 10/28/12 0410 10/27/12 1800 10/27/12 1724 10/27/12 0915 10/27/12 0650  WBC 14.7* 8.4 -- -- 18.9*  HGB 9.0* 9.9* -- -- 13.2  PLT 222 207 -- -- 312  LABCREA -- -- -- -- --  CREATININE 3.18* -- 3.62* 3.76* --   Estimated Creatinine Clearance: 41.2 ml/min (by C-G formula based on Cr of 3.18). No results found for this basename: VANCOTROUGH:2,VANCOPEAK:2,VANCORANDOM:2,GENTTROUGH:2,GENTPEAK:2,GENTRANDOM:2,TOBRATROUGH:2,TOBRAPEAK:2,TOBRARND:2,AMIKACINPEAK:2,AMIKACINTROU:2,AMIKACIN:2, in the last 72 hours   Microbiology: Recent Results (from the past 720 hour(s))  SURGICAL PCR SCREEN     Status: Abnormal   Collection Time   10/19/12  9:28 AM      Component Value Range Status Comment   MRSA, PCR NEGATIVE  NEGATIVE Final    Staphylococcus aureus POSITIVE (*) NEGATIVE Final     Anti-infectives     Start     Dose/Rate Route Frequency Ordered Stop   10/28/12 2200   imipenem-cilastatin (PRIMAXIN) 500 mg in sodium chloride 0.9 % 100 mL IVPB        500 mg 200 mL/hr over 30 Minutes Intravenous 3 times per day 10/28/12 1217     10/28/12 1930   vancomycin (VANCOCIN) 1,500 mg in sodium chloride 0.9 % 500 mL IVPB        1,500 mg 250 mL/hr over 120 Minutes Intravenous Every 24 hours  10/27/12 1758     10/27/12 1930   vancomycin (VANCOCIN) 2,000 mg in sodium chloride 0.9 % 500 mL IVPB        2,000 mg 250 mL/hr over 120 Minutes Intravenous  Once 10/27/12 1757 10/27/12 2124   10/27/12 1830   imipenem-cilastatin (PRIMAXIN) 250 mg in sodium chloride 0.9 % 100 mL IVPB        250 mg 200 mL/hr over 30 Minutes Intravenous 4 times per day 10/27/12 1758 10/28/12 1759   10/27/12 1330   ceFAZolin (ANCEF) IVPB 2 g/50 mL premix        2 g 100 mL/hr over 30 Minutes Intravenous  Once 10/27/12 1300 10/27/12 1501   10/26/12 1600   cefOXitin (MEFOXIN) 1 g in dextrose 5 % 50 mL IVPB        1 g 100 mL/hr over 30 Minutes Intravenous Every 6 hours 10/26/12 1517 10/27/12 0417   10/26/12 0600   ceFAZolin (ANCEF) 3 g in dextrose 5 % 50 mL IVPB        3 g 160 mL/hr over 30 Minutes Intravenous On call to O.R. 10/25/12 1337 10/26/12 0915          Assessment: Abdominal compartment syndrome - initiated on empiric antibiotic coverage with Vancomycin and Primaxin 11/26.  His renal function is improving and his Primaxin regimen requires adjustment.  Goal of Therapy:  Vancomycin trough level 15-20 mcg/ml  Plan:  Change Primaxin to 500mg  IV q8h Continue Vancomycin 1500mg  IV q24h Monitor renal function  closely  Estella Husk, Pharm.D., BCPS Clinical Pharmacist  Phone [phone] Pager [phone] 10/28/2012, 12:24 PM

## 2012-10-28 NOTE — Progress Notes (Signed)
1 Day Post-Op  Subjective: Patient intubated, sedated; arousable Appreciate CCM management UOP improved    Objective: Vital signs in last 24 hours: Temp:  [97.7 F (36.5 C)-99.9 F (37.7 C)] 99.9 F (37.7 C) (11/27 0754) Pulse Rate:  [73-112] 108  (11/27 1000) Resp:  [15-29] 16  (11/27 1000) BP: (75-129)/(39-70) 90/58 mmHg (11/27 1000) SpO2:  [89 %-100 %] 97 % (11/27 1000) Arterial Line BP: (75-118)/(39-55) 99/49 mmHg (11/27 1000) FiO2 (%):  [40 %-100 %] 40.2 % (11/27 1000) Weight:  [253 lb 15.5 oz (115.2 kg)] 253 lb 15.5 oz (115.2 kg) (11/27 0400) Last BM Date:  (preop)  Intake/Output from previous day: 11/26 0701 - 11/27 0700 In: 4938.6 [I.V.:3708.6; NG/GT:120; IV Piggyback:1110] Out: 2775 [Urine:1275; Drains:800; Blood:700] Intake/Output this shift: Total I/O In: 552.8 [I.V.:552.8] Out: 270 [Urine:220; Drains:50]  General appearance: Intubated, sedated; able to respond to verbal stimuli Resp: clear bilaterally Cardio: tachycardic; regular rhythm GI: Open abd VAC; good seal;    Lab Results:   Basename 10/28/12 0410 10/27/12 1800  WBC 14.7* 8.4  HGB 9.0* 9.9*  HCT 26.3* 28.5*  PLT 222 207   BMET  Basename 10/28/12 0410 10/27/12 1724  NA 136 134*  K 4.8 4.9  CL 104 102  CO2 21 21  GLUCOSE 147* 123*  BUN 35* 32*  CREATININE 3.18* 3.62*  CALCIUM 7.8* 7.8*   PT/INR No results found for this basename: LABPROT:2,INR:2 in the last 72 hours ABG  Basename 10/28/12 0540 10/28/12 0413  PHART 7.360 7.344*  HCO3 21.6 21.5    Studies/Results: US Renal Port  10/28/2012  *RADIOLOGY REPORT*  Clinical Data: Renal insufficiency, hypertension  RENAL/URINARY TRACT ULTRASOUND COMPLETE  Comparison:  06/16/2012  Findings:  Right Kidney:  12.6 cm length.  Normal cortex and echogenicity.  No hydronephrosis or focal abnormality.  Left Kidney:  13.1 cm length.  Normal cortex and echogenicity.  No hydronephrosis or focal abnormality.  Bladder:  Decompressed by Foley  catheter.  IMPRESSION: No acute finding or hydronephrosis.   Original Report Authenticated By: Judie Petit. Miles Costain, M.D.    Dg Chest Port 1 View  10/28/2012  *RADIOLOGY REPORT*  Clinical Data: Endotracheal tube.  PORTABLE CHEST - 1 VIEW  Comparison: 10/27/2012.  Findings: Endotracheal tube is in satisfactory position. Nasogastric tube is followed into the stomach.  Left subclavian central line tip projects over the SVC.  Heart size stable.  Lungs are low in volume with central pulmonary vascular congestion and mild bibasilar volume loss.  No definite pleural fluid.  IMPRESSION: Low lung volumes with central pulmonary vascular congestion and bibasilar atelectasis.   Original Report Authenticated By: Leanna Battles, M.D.    Dg Chest Port 1 View  10/27/2012  *RADIOLOGY REPORT*  Clinical Data: Intubated, line placement  PORTABLE CHEST - 1 VIEW  Comparison: 04/06/2012  Findings: Endotracheal and nasogastric tubes appropriately positioned.  Left subclavian central line tip terminates over the proximal SVC.  Heart size normal.  Lung volumes are low with crowding of the bronchovascular markings.  Retrocardiac probable atelectasis noted.  No pleural effusion.  No pneumothorax.  IMPRESSION: Appropriate support apparatus position as above.  Low volumes with probable bibasilar atelectasis.   Original Report Authenticated By: Christiana Pellant, M.D.     Anti-infectives: Anti-infectives     Start     Dose/Rate Route Frequency Ordered Stop   10/28/12 1930   vancomycin (VANCOCIN) 1,500 mg in sodium chloride 0.9 % 500 mL IVPB        1,500 mg 250 mL/hr over  120 Minutes Intravenous Every 24 hours 10/27/12 1758     10/27/12 1930   vancomycin (VANCOCIN) 2,000 mg in sodium chloride 0.9 % 500 mL IVPB        2,000 mg 250 mL/hr over 120 Minutes Intravenous  Once 10/27/12 1757 10/27/12 2124   10/27/12 1830   imipenem-cilastatin (PRIMAXIN) 250 mg in sodium chloride 0.9 % 100 mL IVPB        250 mg 200 mL/hr over 30 Minutes  Intravenous 4 times per day 10/27/12 1758     10/27/12 1330   ceFAZolin (ANCEF) IVPB 2 g/50 mL premix        2 g 100 mL/hr over 30 Minutes Intravenous  Once 10/27/12 1300 10/27/12 1501   10/26/12 1600   cefOXitin (MEFOXIN) 1 g in dextrose 5 % 50 mL IVPB        1 g 100 mL/hr over 30 Minutes Intravenous Every 6 hours 10/26/12 1517 10/27/12 0417   10/26/12 0600   ceFAZolin (ANCEF) 3 g in dextrose 5 % 50 mL IVPB        3 g 160 mL/hr over 30 Minutes Intravenous On call to O.R. 10/25/12 1337 10/26/12 0915          Assessment/Plan: s/p Procedure(s) (LRB) with comments: EXPLORATORY LAPAROTOMY (N/A) s/p revision of colon anastomosis; primary repair of ventral hernia; post-op hemoperitoneum with abdominal compartment syndrome Plan return to OR tomorrow for washout, possible partial closure of abdominal wall Will hold off transfusion at this time unless his Hgb continues to decrease CCM - trying to wean off pressors Renal - no indications for HD at this time; appreciate their assistance.   LOS: 2 days    Stacey Maura K. 10/28/2012

## 2012-10-28 NOTE — Progress Notes (Signed)
PULMONARY  / CRITICAL CARE MEDICINE  Name: Douglas Wiggins MRN: [id number] DOB: 09/10/1966    LOS: 2  REFERRING MD:  [name])  CHIEF COMPLAINT:  Post op respiratory failure  BRIEF PATIENT DESCRIPTION: 46 yo admitted 11/24 for exploratory laparotomy, partial colectomy with revision of colon anastomosis and primary repair of ventral hernia on 11/25.  Course was complicated by hemoperitoneum and abdominal compartment syndrome with repeated laparotomy on 11/26.  Brought to ICU hypoxic, mechanically ventilated.  LINES / TUBES: ETT 11/26 >>  L [place] CVL 11/26 >>> R rad A line 11/26 >>>   CULTURES: 11/26  Urine >>  ANTIBIOTICS: Primaxin 11/26 >>  Vancomycin 11/26 >>   SIGNIFICANT EVENTS:  11/24  Admitted for elective surgery 11/25  Exploratory laparotomy, partial colectomy with revision of colon anastomosis and primary repair of ventral hernia 11/26  Repeated laparotomy for abdominal compartment syndrome, brought to ICU hypoxic, mechanically ventilated  LEVEL OF CARE:  ICU PRIMARY SERVICE:  [facility] CONSULTANTS:  PCCM CODE STATUS:  Full DIET:  NPO DVT Px:  SCDs GI Px:  Protonix   SUBJ: Despite fentanyl and midaz gtts, opens eyes to voice and F/C  VITAL SIGNS: Temp:  [97.6 F (36.4 C)-99.9 F (37.7 C)] 99.9 F (37.7 C) (11/27 0754) Pulse Rate:  [73-112] 109  (11/27 0900) Resp:  [15-29] 16  (11/27 0900) BP: (75-129)/(39-70) 100/50 mmHg (11/27 0900) SpO2:  [89 %-100 %] 100 % (11/27 0900) Arterial Line BP: (75-118)/(39-55) 101/52 mmHg (11/27 0900) FiO2 (%):  [49.6 %-100 %] 49.6 % (11/27 0900) Weight:  [115.2 kg (253 lb 15.5 oz)] 115.2 kg (253 lb 15.5 oz) (11/27 0400)  HEMODYNAMICS: CVP:  [11 mmHg-16 mmHg] 16 mmHg  VENTILATOR SETTINGS: Vent Mode:  [-] PRVC FiO2 (%):  [49.6 %-100 %] 49.6 % Set Rate:  [16 bmp-22 bmp] 16 bmp Vt Set:  [610 mL-700 mL] 700 mL PEEP:  [8.2 cmH20-10 cmH20] 10 cmH20 Plateau Pressure:  [17 cmH20-26 cmH20] 23 cmH20  INTAKE /  OUTPUT: Intake/Output      11/26 0701 - 11/27 0700 11/27 0701 - 11/28 0700   P.O.     I.V. (mL/kg) 3708.6 (32.2) 371 (3.2)   NG/GT 120    IV Piggyback 1110    Total Intake(mL/kg) 4938.6 (42.9) 371 (3.2)   Urine (mL/kg/hr) 1275 (0.5) 160   Drains 800 50   Blood 700    Total Output 2775 210   Net +2163.6 +161         PHYSICAL EXAMINATION: General:  Intubated, NAD Neuro:  RASS -1, + F/C, MAEs HEENT:  WNL Cardiovascular: RRR s M  Lungs:  Clear anteriorly Abdomen:  Mildly distended, soft, abssent BS Ext: warm, no edema   LABS:  Lab 10/28/12 0540 10/28/12 0410 10/28/12 0335 10/27/12 2141 10/27/12 2135 10/27/12 2033 10/27/12 1800 10/27/12 1724 10/27/12 1712 10/27/12 0915 10/27/12 0650  HGB -- 9.0* -- -- -- -- 9.9* -- -- -- 13.2  WBC -- 14.7* -- -- -- -- 8.4 -- -- -- 18.9*  PLT -- 222 -- -- -- -- 207 -- -- -- 312  NA -- 136 -- -- -- -- -- 134* -- 135 --  K -- 4.8 -- -- -- -- -- 4.9 -- -- --  CL -- 104 -- -- -- -- -- 102 -- 99 --  CO2 -- 21 -- -- -- -- -- 21 -- 23 --  GLUCOSE -- 147* -- -- -- -- -- 123* -- 138* --  BUN -- 35* -- -- -- -- --  32* -- 28* --  CREATININE -- 3.18* -- -- -- -- -- 3.62* -- 3.76* --  CALCIUM -- 7.8* -- -- -- -- -- 7.8* -- 8.7 --  MG -- -- -- -- -- -- -- -- -- -- --  PHOS -- -- -- -- -- -- -- -- -- -- --  AST -- 608* -- -- -- -- -- -- -- -- --  ALT -- 819* -- -- -- -- -- -- -- -- --  ALKPHOS -- 54 -- -- -- -- -- -- -- -- --  BILITOT -- 0.4 -- -- -- -- -- -- -- -- --  PROT -- 5.3* -- -- -- -- -- -- -- -- --  ALBUMIN -- 2.6* -- -- -- -- -- -- -- -- --  APTT -- -- -- -- -- -- -- -- -- -- --  INR -- -- -- -- -- -- -- -- -- -- --  LATICACIDVEN -- -- 1.7 -- 1.3 -- -- -- -- -- --  TROPONINI -- -- -- -- -- -- -- -- -- -- --  PROCALCITON -- 2.35 -- 2.68 -- -- -- -- -- -- --  PROBNP -- -- -- -- -- -- -- -- -- -- --  O2SATVEN -- -- -- -- -- -- -- -- -- -- --  PHART 7.360 -- -- -- -- 7.343* -- -- 7.277* -- --  PCO2ART 38.3 -- -- -- -- 35.4 -- -- 44.1 -- --   PO2ART 115.0* -- -- -- -- 97.0 -- -- 63.0* -- --   No results found for this basename: GLUCAP:5 in the last 168 hours  IMAGING:  US Renal Port  10/28/2012  *RADIOLOGY REPORT*  Clinical Data: Renal insufficiency, hypertension  RENAL/URINARY TRACT ULTRASOUND COMPLETE  Comparison:  06/16/2012  Findings:  Right Kidney:  12.6 cm length.  Normal cortex and echogenicity.  No hydronephrosis or focal abnormality.  Left Kidney:  13.1 cm length.  Normal cortex and echogenicity.  No hydronephrosis or focal abnormality.  Bladder:  Decompressed by Foley catheter.  IMPRESSION: No acute finding or hydronephrosis.   Original Report Authenticated By: Judie Petit. Miles Costain, M.D.    Dg Chest Port 1 View  10/28/2012  *RADIOLOGY REPORT*  Clinical Data: Endotracheal tube.  PORTABLE CHEST - 1 VIEW  Comparison: 10/27/2012.  Findings: Endotracheal tube is in satisfactory position. Nasogastric tube is followed into the stomach.  Left subclavian central line tip projects over the SVC.  Heart size stable.  Lungs are low in volume with central pulmonary vascular congestion and mild bibasilar volume loss.  No definite pleural fluid.  IMPRESSION: Low lung volumes with central pulmonary vascular congestion and bibasilar atelectasis.   Original Report Authenticated By: Leanna Battles, M.D.    Dg Chest Port 1 View  10/27/2012  *RADIOLOGY REPORT*  Clinical Data: Intubated, line placement  PORTABLE CHEST - 1 VIEW  Comparison: 04/06/2012  Findings: Endotracheal and nasogastric tubes appropriately positioned.  Left subclavian central line tip terminates over the proximal SVC.  Heart size normal.  Lung volumes are low with crowding of the bronchovascular markings.  Retrocardiac probable atelectasis noted.  No pleural effusion.  No pneumothorax.  IMPRESSION: Appropriate support apparatus position as above.  Low volumes with probable bibasilar atelectasis.   Original Report Authenticated By: Christiana Pellant, M.D.    ECG:  No  new  DIAGNOSES: Principal Problem:  *Status post laparotomy, evac of hemoperitoneum and wound vac placement 11/26 Active Problems:  Abdominal compartment syndrome, post lap 11/26  Shock due to  abdominal compartment syndrome and acute blood loss  Respiratory failure, post-operative  Hypothyroid  Hemoperitoneum, resolved post lap 11/26  Acute renal insufficiency, nonoliguric   Ex lap with partial colectomy with revision of colon anastomosis, repair of ventral hernia 11/25  H/O Hypertension  Encephalopathy acute  ASSESSMENT / PLAN:  PULMONARY  A:  Post op vent dependence - planned return to OR 11/28.  P:   Vent settings adjusted Daily WUA/SBT Assess for extubation after return to OR   CARDIOVASCULAR  A: Shock P:  Levophed gtt, titrate to off for MAP > 60 mmHg Holding antihypertensives     RENAL  A:  AKI, likely secondary to compartment syndrome. P:   Goal CVP>10 Trend BMP IVF to [facility] Renal service following   GASTROINTESTINAL  A:  S/p laparotomy x 2.  Abdominal compartment syndrome.  Hemoperitoneum. P:   Post op mgmt per CCS Planned return to OR 11/28 for attempted closure   HEMATOLOGIC  A:  Acute blood loss anemia. P:  Trend CBC No indications for transfusion at this time Transfuse per current guidelines   INFECTIOUS  A:  No evidence of acute infection. P:   Cultures and antibiotics as above per CCS PCT   ENDOCRINE   A:  Hypothyroidism. P:   Cont Synthroid IV  NEUROLOGIC  A:  Acute encephalopathy.  Post surgical pain. P:   Goal RASS -1 Fentanyl / Versed gtt    CRITICAL CARE:  The patient is critically ill with multiple organ systems failure and requires high complexity decision making for assessment and support, frequent evaluation and titration of therapies, application of advanced monitoring technologies and extensive interpretation of multiple databases. Critical Care Time devoted to patient care services described in this note is  40 minutes.   Billy Fischer, MD  Pulmonary and Critical Care Medicine [facility] Pager: [phone]  10/28/2012, 9:30 AM

## 2012-10-28 NOTE — Progress Notes (Signed)
Subjective:  Events noted.  Hypotensive overnight now on norepi.  Fortunately UOP is good and creatinine is trending down Objective Vital signs in last 24 hours: Filed Vitals:   10/28/12 0600 10/28/12 0700 10/28/12 0754 10/28/12 0811  BP: 101/53 90/50  91/54  Pulse: 111 109  104  Temp:   99.9 F (37.7 C)   TempSrc:   Oral   Resp: 16 16  17   Height:      Weight:      SpO2: 100% 99%  100%   Weight change: -4.414 kg (-9 lb 11.7 oz)  Intake/Output Summary (Last 24 hours) at 10/28/12 0828 Last data filed at 10/28/12 0800  Gross per 24 hour  Intake 5063.63 ml  Output   2860 ml  Net 2203.63 ml   Labs: Basic Metabolic Panel:  Lab 10/28/12 9147 10/27/12 1724 10/27/12 0915  NA 136 134* 135  K 4.8 4.9 5.4*  CL 104 102 99  CO2 21 21 23   GLUCOSE 147* 123* 138*  BUN 35* 32* 28*  CREATININE 3.18* 3.62* 3.76*  CALCIUM 7.8* 7.8* 8.7  ALB -- -- --  PHOS -- -- --   Liver Function Tests:  Lab 10/28/12 0410  AST 608*  ALT 819*  ALKPHOS 54  BILITOT 0.4  PROT 5.3*  ALBUMIN 2.6*   No results found for this basename: LIPASE:3,AMYLASE:3 in the last 168 hours No results found for this basename: AMMONIA:3 in the last 168 hours CBC:  Lab 10/28/12 0410 10/27/12 1800 10/27/12 0650 10/26/12 1541  WBC 14.7* 8.4 18.9* --  NEUTROABS -- -- -- --  HGB 9.0* 9.9* 13.2 --  HCT 26.3* 28.5* 38.8* --  MCV 86.5 86.6 88.0 86.2  PLT 222 207 312 --   Cardiac Enzymes: No results found for this basename: CKTOTAL:5,CKMB:5,CKMBINDEX:5,TROPONINI:5 in the last 168 hours CBG: No results found for this basename: GLUCAP:5 in the last 168 hours  Iron Studies: No results found for this basename: IRON,TIBC,TRANSFERRIN,FERRITIN in the last 72 hours Studies/Results: Dg Chest Port 1 View  10/27/2012  *RADIOLOGY REPORT*  Clinical Data: Intubated, line placement  PORTABLE CHEST - 1 VIEW  Comparison: 04/06/2012  Findings: Endotracheal and nasogastric tubes appropriately positioned.  Left subclavian central line  tip terminates over the proximal SVC.  Heart size normal.  Lung volumes are low with crowding of the bronchovascular markings.  Retrocardiac probable atelectasis noted.  No pleural effusion.  No pneumothorax.  IMPRESSION: Appropriate support apparatus position as above.  Low volumes with probable bibasilar atelectasis.   Original Report Authenticated By: Christiana Pellant, M.D.    Medications: Infusions:    . dextrose 5 % and 0.9% NaCl 1,000 mL infusion 125 mL/hr at 10/27/12 1830  . fentaNYL infusion INTRAVENOUS 200 mcg/hr (10/28/12 0800)  . midazolam (VERSED) infusion 3 mg/hr (10/28/12 0800)  . norepinephrine (LEVOPHED) Adult infusion 10 mcg/min (10/28/12 0800)  . [DISCONTINUED] sodium chloride Stopped (10/27/12 1445)  . [DISCONTINUED] dextrose 5 % and 0.45 % NaCl with KCl 20 mEq/L Stopped (10/27/12 1115)  . [DISCONTINUED] dextrose 5 % and 0.9% NaCl 1,000 mL infusion 125 mL/hr at 10/27/12 1749  . [DISCONTINUED] dextrose 5 % and 0.9% NaCl 125 mL/hr at 10/27/12 1121    Scheduled Medications:    . antiseptic oral rinse  15 mL Mouth Rinse q12n4p  . [COMPLETED]  ceFAZolin (ANCEF) IV  2 g Intravenous Once  . chlorhexidine  15 mL Mouth/Throat BID  . [COMPLETED] chlorhexidine      . Chlorhexidine Gluconate Cloth  6 each  Topical Daily  . [COMPLETED] dexmedetomidine  0.4-1.2 mcg/kg/hr Intravenous Once  . imipenem-cilastatin  250 mg Intravenous Q6H  . levothyroxine  26 mcg Intravenous Daily  . mupirocin ointment  1 application Nasal BID  . pantoprazole (PROTONIX) IV  40 mg Intravenous Q24H  . vancomycin  1,500 mg Intravenous Q24H  . [COMPLETED] vancomycin  2,000 mg Intravenous Once  . [DISCONTINUED] albuterol  2.5 mg Nebulization Q6H  . [DISCONTINUED] antiseptic oral rinse  15 mL Mouth Rinse QID  . [DISCONTINUED] chlorhexidine  15 mL Mouth Rinse BID  . [DISCONTINUED] enoxaparin  30 mg Subcutaneous Q24H  . [DISCONTINUED] enoxaparin  40 mg Subcutaneous Q24H  . [DISCONTINUED] fentaNYL  50-100 mcg  Intravenous Once  . [DISCONTINUED] HYDROmorphone PCA 0.3 mg/mL   Intravenous Q4H  . [DISCONTINUED] ketorolac  15 mg Intravenous Q6H  . [DISCONTINUED] levothyroxine  75 mcg Oral Q supper  . [DISCONTINUED] venlafaxine  150 mg Oral BID WC    have reviewed scheduled and prn medications.  Physical Exam: General: sedated but opening eyes to stimuli Heart: tachy Lungs: CBS bilaterally Abdomen:distended, vac in place Extremities: some dependant pitting edema    Assessment/ Plan: Pt is a 46 y.o. yo male who was admitted on 10/26/2012 with  Complicated abdominal surgery now with AKI  Assessment/Plan: 1. Renal- AKI after complicated abdominal surgery- likely multifactorial due to ATN/hypotension with previous ACE and ARB, also element of analgesic nephropathy (mobic/toradol) and increased intraabdominal pressure.  Fortunately seems to be turning the corner at least as far as kidney function.  No indications for HD today.  Kidney function completely normal at baseline.  2. Volume- receiving IVF per CCM, CVP recorded at 12-13, hypotensive.  OK to continue 3. Anemia- drop not surprising given 2 surgeries    Mylea Roarty A   10/28/2012,8:28 AM  LOS: 2 days

## 2012-10-29 ENCOUNTER — Encounter (HOSPITAL_COMMUNITY): Payer: Self-pay | Admitting: Certified Registered"

## 2012-10-29 ENCOUNTER — Encounter (HOSPITAL_COMMUNITY)
Admission: RE | Disposition: A | Discharge status: Home Health | Payer: Self-pay | Source: Ambulatory Visit | Attending: Surgery

## 2012-10-29 ENCOUNTER — Inpatient Hospital Stay (HOSPITAL_COMMUNITY): Payer: BC Managed Care – PPO

## 2012-10-29 ENCOUNTER — Inpatient Hospital Stay (HOSPITAL_COMMUNITY): Payer: BC Managed Care – PPO | Admitting: Certified Registered"

## 2012-10-29 DIAGNOSIS — R29898 Other symptoms and signs involving the musculoskeletal system: Secondary | ICD-10-CM

## 2012-10-29 DIAGNOSIS — S31109A Unspecified open wound of abdominal wall, unspecified quadrant without penetration into peritoneal cavity, initial encounter: Secondary | ICD-10-CM

## 2012-10-29 HISTORY — PX: APPLICATION OF WOUND VAC: SHX5189

## 2012-10-29 HISTORY — PX: LAPAROTOMY: SHX154

## 2012-10-29 LAB — URINE CULTURE
Colony Count: NO GROWTH
Culture: NO GROWTH

## 2012-10-29 LAB — POCT I-STAT 3, ART BLOOD GAS (G3+)
O2 Saturation: 94 %
TCO2: 24 mmol/L (ref 0–100)
pCO2 arterial: 43.2 mmHg (ref 35.0–45.0)
pO2, Arterial: 81 mmHg (ref 80.0–100.0)

## 2012-10-29 LAB — RENAL FUNCTION PANEL
BUN: 33 mg/dL — ABNORMAL HIGH (ref 6–23)
CO2: 23 mEq/L (ref 19–32)
Calcium: 8.3 mg/dL — ABNORMAL LOW (ref 8.4–10.5)
Creatinine, Ser: 2.01 mg/dL — ABNORMAL HIGH (ref 0.50–1.35)
GFR calc Af Amer: 45 mL/min — ABNORMAL LOW (ref >90)
Glucose, Bld: 103 mg/dL — ABNORMAL HIGH (ref 70–99)
Phosphorus: 2.6 mg/dL (ref 2.3–4.6)
Sodium: 137 mEq/L (ref 135–145)

## 2012-10-29 LAB — CBC
HCT: 21.1 % — ABNORMAL LOW (ref 39.0–52.0)
HCT: 26.4 % — ABNORMAL LOW (ref 39.0–52.0)
Hemoglobin: 7.4 g/dL — ABNORMAL LOW (ref 13.0–17.0)
MCH: 29.4 pg (ref 26.0–34.0)
MCHC: 35.1 g/dL (ref 30.0–36.0)
MCHC: 33.7 g/dL (ref 30.0–36.0)
MCV: 87.9 fL (ref 78.0–100.0)
MCV: 87.1 fL (ref 78.0–100.0)
RDW: 14.4 % (ref 11.5–15.5)
RDW: 14.6 % (ref 11.5–15.5)

## 2012-10-29 LAB — BASIC METABOLIC PANEL
BUN: 28 mg/dL — ABNORMAL HIGH (ref 6–23)
Creatinine, Ser: 1.58 mg/dL — ABNORMAL HIGH (ref 0.50–1.35)
GFR calc Af Amer: 60 mL/min — ABNORMAL LOW (ref >90)
GFR calc non Af Amer: 52 mL/min — ABNORMAL LOW (ref >90)

## 2012-10-29 SURGERY — LAPAROTOMY, EXPLORATORY
Anesthesia: General | Site: Abdomen | Wound class: Clean Contaminated

## 2012-10-29 MED ORDER — SODIUM CHLORIDE 0.9 % IV SOLN
50.0000 mg | INTRAVENOUS | Status: DC | PRN
  Administered 2012-10-29: 2 mg/h via INTRAVENOUS

## 2012-10-29 MED ORDER — DEXTROSE 5 % IV SOLN
INTRAVENOUS | Status: DC | PRN
  Administered 2012-10-29: 10:00:00 via INTRAVENOUS

## 2012-10-29 MED ORDER — ROCURONIUM BROMIDE 100 MG/10ML IV SOLN
INTRAVENOUS | Status: DC | PRN
  Administered 2012-10-29: 50 mg via INTRAVENOUS
  Administered 2012-10-29: 30 mg via INTRAVENOUS

## 2012-10-29 MED ORDER — SODIUM CHLORIDE 0.9 % IV SOLN
500.0000 mg | Freq: Four times a day (QID) | INTRAVENOUS | Status: DC
  Administered 2012-10-29 – 2012-10-30 (×3): 500 mg via INTRAVENOUS
  Filled 2012-10-29 (×5): qty 500

## 2012-10-29 MED ORDER — SODIUM CHLORIDE 0.9 % IV SOLN
2500.0000 ug | INTRAVENOUS | Status: DC | PRN
  Administered 2012-10-29: 250 ug/h via INTRAVENOUS

## 2012-10-29 MED ORDER — VANCOMYCIN HCL IN DEXTROSE 1-5 GM/200ML-% IV SOLN
1000.0000 mg | Freq: Two times a day (BID) | INTRAVENOUS | Status: DC
  Administered 2012-10-29 (×2): 1000 mg via INTRAVENOUS
  Filled 2012-10-29 (×5): qty 200

## 2012-10-29 MED ORDER — LACTATED RINGERS IV SOLN
INTRAVENOUS | Status: DC | PRN
  Administered 2012-10-29 (×3): via INTRAVENOUS

## 2012-10-29 MED ORDER — SODIUM CHLORIDE 0.9 % IV SOLN
INTRAVENOUS | Status: DC | PRN
  Administered 2012-10-29: 11:00:00 via INTRAVENOUS

## 2012-10-29 MED ORDER — VANCOMYCIN HCL 1000 MG IV SOLR
INTRAVENOUS | Status: AC
  Filled 2012-10-29: qty 1000

## 2012-10-29 MED ORDER — VANCOMYCIN HCL IN DEXTROSE 1-5 GM/200ML-% IV SOLN
INTRAVENOUS | Status: AC
  Filled 2012-10-29: qty 200

## 2012-10-29 MED ORDER — FENTANYL CITRATE 0.05 MG/ML IJ SOLN
INTRAMUSCULAR | Status: DC | PRN
  Administered 2012-10-29 (×2): 50 ug via INTRAVENOUS
  Administered 2012-10-29: 150 ug via INTRAVENOUS

## 2012-10-29 SURGICAL SUPPLY — 42 items
BLADE SURG ROTATE 9660 (MISCELLANEOUS) IMPLANT
CANISTER SUCTION 2500CC (MISCELLANEOUS) ×3 IMPLANT
CANISTER WOUND CARE 500ML ATS (WOUND CARE) ×3 IMPLANT
CHLORAPREP W/TINT 26ML (MISCELLANEOUS) ×3 IMPLANT
CLOTH BEACON ORANGE TIMEOUT ST (SAFETY) ×3 IMPLANT
COVER SURGICAL LIGHT HANDLE (MISCELLANEOUS) ×3 IMPLANT
DRAPE LAPAROSCOPIC ABDOMINAL (DRAPES) ×3 IMPLANT
DRAPE UTILITY 15X26 W/TAPE STR (DRAPE) ×6 IMPLANT
DRAPE WARM FLUID 44X44 (DRAPE) ×3 IMPLANT
DRSG VAC ATS LRG SENSATRAC (GAUZE/BANDAGES/DRESSINGS) ×3 IMPLANT
ELECT CAUTERY BLADE 6.4 (BLADE) ×3 IMPLANT
ELECT REM PT RETURN 9FT ADLT (ELECTROSURGICAL) ×3
ELECTRODE REM PT RTRN 9FT ADLT (ELECTROSURGICAL) ×2 IMPLANT
GAUZE SPONGE 4X4 16PLY XRAY LF (GAUZE/BANDAGES/DRESSINGS) IMPLANT
GLOVE BIO SURGEON STRL SZ7 (GLOVE) ×3 IMPLANT
GLOVE BIOGEL PI IND STRL 7.5 (GLOVE) ×2 IMPLANT
GLOVE BIOGEL PI INDICATOR 7.5 (GLOVE) ×1
GOWN STRL NON-REIN LRG LVL3 (GOWN DISPOSABLE) ×6 IMPLANT
KIT BASIN OR (CUSTOM PROCEDURE TRAY) ×3 IMPLANT
KIT ROOM TURNOVER OR (KITS) ×3 IMPLANT
LIGASURE IMPACT 36 18CM CVD LR (INSTRUMENTS) IMPLANT
NS IRRIG 1000ML POUR BTL (IV SOLUTION) ×3 IMPLANT
PACK GENERAL/GYN (CUSTOM PROCEDURE TRAY) ×3 IMPLANT
PAD ARMBOARD 7.5X6 YLW CONV (MISCELLANEOUS) ×6 IMPLANT
SPECIMEN JAR LARGE (MISCELLANEOUS) IMPLANT
SPONGE GAUZE 4X4 12PLY (GAUZE/BANDAGES/DRESSINGS) ×3 IMPLANT
SPONGE LAP 18X18 X RAY DECT (DISPOSABLE) IMPLANT
STAPLER VISISTAT 35W (STAPLE) ×3 IMPLANT
SUCTION POOLE TIP (SUCTIONS) IMPLANT
SUT NOVA 1 T20/GS 25DT (SUTURE) ×9 IMPLANT
SUT PDS AB 1 TP1 96 (SUTURE) IMPLANT
SUT SILK 2 0 SH CR/8 (SUTURE) ×3 IMPLANT
SUT SILK 2 0 TIES 10X30 (SUTURE) ×3 IMPLANT
SUT SILK 3 0 SH CR/8 (SUTURE) ×3 IMPLANT
SUT SILK 3 0 TIES 10X30 (SUTURE) ×3 IMPLANT
SUT VIC AB 3-0 SH 18 (SUTURE) IMPLANT
TOWEL OR 17X24 6PK STRL BLUE (TOWEL DISPOSABLE) ×3 IMPLANT
TOWEL OR 17X26 10 PK STRL BLUE (TOWEL DISPOSABLE) ×3 IMPLANT
TRAY FOLEY CATH 14FRSI W/METER (CATHETERS) IMPLANT
UNDERPAD 30X30 INCONTINENT (UNDERPADS AND DIAPERS) IMPLANT
WATER STERILE IRR 1000ML POUR (IV SOLUTION) ×3 IMPLANT
YANKAUER SUCT BULB TIP NO VENT (SUCTIONS) IMPLANT

## 2012-10-29 NOTE — Anesthesia Procedure Notes (Addendum)
Performed by: Ellin Goodie   Date/Time: 10/29/2012 10:10 AM Performed by: Ellin Goodie Comments: #8 ETT in situ from SICU, + ETCO2, BBSE, O2 sats 100% .

## 2012-10-29 NOTE — Op Note (Signed)
Pre-op Diagnosis:  Open abdomen secondary to abdominal compartment syndrome Postop diagnosis: Same Procedure performed: Exploratory laparotomy with closure of abdominal wall after relaxing incisions bilaterally Surgeon:Linzy Darling K. Assistant: Dr. Violeta Gelinas Anesthesia: Gen. Indications: This is a 46 year old male who is 3 days postop from exploratory laparotomy, revision of a colon anastomosis, and primary repair of a ventral hernia. On postoperative day #1 the patient developed acute renal failure and was found to have abdominal compartment syndrome. He underwent exploratory laparotomy and it was some bleeding noted from his colon mesentery.  This was controlled and the patient's abdomen was left open. He is improved clinically. He returns the operating room today for washout and possible closure.  Description of procedure: The patient was brought to the operating room and placed in the supine position on the operating room table. After an adequate level of general anesthesia was obtained the pack was removed. We prepped the skin around the wound with chlor prep and prepped the wound with Betadine. It is a timeout was taken to ensure the proper patient and proper procedure. We draped in sterile fashion. The inner VAC sponge was removed. The bowel appears pink and healthy. We explored the abdomen and there was no blood clot or active bleeding noted. The abdomen seems quite clean. Our anastomosis is intact with no sign of leak. The mesentery at the colon anastomosis was carefully inspected. There is no hematoma or bleeding noted here. We irrigated the abdomen thoroughly. We raised subcutaneous skin flaps in both directions laterally. A left we raised the skin flap past the previous closure of the colostomy site. We then created vertical relaxing incisions by incising the anterior sheath of the external oblique muscles. This allowed the midline abdominal fascia to come together with very little  tension. We then closed the fascia with multiple interrupted figure-of-eight #1 Novafil sutures. We monitored the patient's peak airway pressures while we were closing. There seemed to be no increase in his peak airway pressures. Once the fascia was closed we thoroughly irrigated the subcutaneous tissues. We cut a medium VAC sponge to fit and seal this with occlusive drape. This was placed to suction. The patient was then transported back to the intensive care unit in critical, but stable condition. All sponge, instrument, and needle counts are correct.  Douglas Wiggins. [name], MD, [facility]  10/29/2012 11:50 AM

## 2012-10-29 NOTE — Progress Notes (Signed)
PULMONARY  / CRITICAL CARE MEDICINE  Name: Douglas Wiggins MRN: [id number] DOB: [birth year]    LOS: 3  REFERRING MD:  [name])  CHIEF COMPLAINT:  Post op respiratory failure  BRIEF PATIENT DESCRIPTION: 46 yo admitted 11/24 for exploratory laparotomy, partial colectomy with revision of colon anastomosis and primary repair of ventral hernia on 11/25.  Course was complicated by hemoperitoneum and abdominal compartment syndrome with repeated laparotomy on 11/26.  Brought to ICU hypoxic, mechanically ventilated.  LINES / TUBES: ETT 11/26 >>  L [place] CVL 11/26 >>> R rad A line 11/26 >>>   CULTURES: 11/26  Urine >>  ANTIBIOTICS: Primaxin 11/26 >>  Vancomycin 11/26 >>   SIGNIFICANT EVENTS:  11/24  Admitted for elective surgery 11/25  Exploratory laparotomy, partial colectomy with revision of colon anastomosis and primary repair of ventral hernia 11/26  Repeated laparotomy for abdominal compartment syndrome, brought to ICU hypoxic, mechanically ventilated  LEVEL OF CARE:  ICU PRIMARY SERVICE:  CCS CONSULTANTS:  PCCM CODE STATUS:  Full DIET:  NPO DVT Px:  SCDs GI Px:  Protonix   SUBJ: Following commands, thick brown secretions, CVP 11-13  VITAL SIGNS: Temp:  [98.2 F (36.8 C)-99.9 F (37.7 C)] 98.2 F (36.8 C) (11/28 0000) Pulse Rate:  [104-114] 105  (11/28 0045) Resp:  [14-29] 14  (11/28 0200) BP: (77-128)/(36-93) 88/44 mmHg (11/28 0200) SpO2:  [91 %-100 %] 97 % (11/28 0200) Arterial Line BP: (88-132)/(43-68) 102/58 mmHg (11/28 0200) FiO2 (%):  [39.6 %-100 %] 39.8 % (11/28 0200)  HEMODYNAMICS: CVP:  [11 mmHg-16 mmHg] 13 mmHg  VENTILATOR SETTINGS: Vent Mode:  [-] PRVC FiO2 (%):  [39.6 %-100 %] 39.8 % Set Rate:  [14 bmp-16 bmp] 14 bmp Vt Set:  [550 mL-700 mL] 550 mL PEEP:  [5 cmH20-10 cmH20] 5 cmH20 Plateau Pressure:  [17 cmH20-23 cmH20] 18 cmH20  INTAKE / OUTPUT: Intake/Output      11/27 0701 - 11/28 0700   I.V. (mL/kg) 1422.8 (12.4)   NG/GT 30   IV  Piggyback 210   Total Intake(mL/kg) 1662.8 (14.4)   Urine (mL/kg/hr) 1505 (0.5)   Emesis/NG output 100   Drains 600   Total Output 2205   Net -542.2        PHYSICAL EXAMINATION: General:  Sedated, comfortable PULM: Rhonchi bilat CV: TAchy, regular, loud systolic murmur AB: infrequent bowel sounds, wound vac Ext: warm, trace edema Neuro: Awake, nods head to voice, follows commands   LABS:  Lab 10/29/12 0315 10/28/12 0935 10/28/12 0540 10/28/12 0413 10/28/12 0410 10/28/12 0335 10/27/12 2141 10/27/12 2135 10/27/12 2033 10/27/12 1800 10/27/12 1724  HGB 7.4* -- -- -- 9.0* -- -- -- -- 9.9* --  WBC 7.5 -- -- -- 14.7* -- -- -- -- 8.4 --  PLT 147* -- -- -- 222 -- -- -- -- 207 --  NA 137 -- -- -- 136 -- -- -- -- -- 134*  K 4.5 -- -- -- 4.8 -- -- -- -- -- --  CL 107 -- -- -- 104 -- -- -- -- -- 102  CO2 23 -- -- -- 21 -- -- -- -- -- 21  GLUCOSE 103* -- -- -- 147* -- -- -- -- -- 123*  BUN 33* -- -- -- 35* -- -- -- -- -- 32*  CREATININE PENDING -- -- -- 3.18* -- -- -- -- -- 3.62*  CALCIUM 8.3* -- -- -- 7.8* -- -- -- -- -- 7.8*  MG -- -- -- -- -- -- -- -- -- -- --  PHOS 2.6 -- -- -- -- -- -- -- -- -- --  AST -- -- -- -- 608* -- -- -- -- -- --  ALT -- -- -- -- 819* -- -- -- -- -- --  ALKPHOS -- -- -- -- 54 -- -- -- -- -- --  BILITOT -- -- -- -- 0.4 -- -- -- -- -- --  PROT -- -- -- -- 5.3* -- -- -- -- -- --  ALBUMIN 2.3* -- -- -- 2.6* -- -- -- -- -- --  APTT -- -- -- -- -- -- -- -- -- -- --  INR -- -- -- -- -- -- -- -- -- -- --  LATICACIDVEN -- 1.2 -- -- -- 1.7 -- 1.3 -- -- --  TROPONINI -- -- -- -- -- -- -- -- -- -- --  PROCALCITON 0.68 -- -- -- 2.35 -- 2.68 -- -- -- --  PROBNP -- -- -- -- -- -- -- -- -- -- --  O2SATVEN -- -- -- -- -- -- -- -- -- -- --  PHART -- -- 7.360 7.344* -- -- -- -- 7.343* -- --  PCO2ART -- -- 38.3 39.7 -- -- -- -- 35.4 -- --  PO2ART -- -- 115.0* 193.0* -- -- -- -- 97.0 -- --   No results found for this basename: GLUCAP:5 in the last 168  hours  IMAGING:  US Renal Port  10/28/2012  *RADIOLOGY REPORT*  Clinical Data: Renal insufficiency, hypertension  RENAL/URINARY TRACT ULTRASOUND COMPLETE  Comparison:  06/16/2012  Findings:  Right Kidney:  12.6 cm length.  Normal cortex and echogenicity.  No hydronephrosis or focal abnormality.  Left Kidney:  13.1 cm length.  Normal cortex and echogenicity.  No hydronephrosis or focal abnormality.  Bladder:  Decompressed by Foley catheter.  IMPRESSION: No acute finding or hydronephrosis.   Original Report Authenticated By: Judie Petit. Miles Costain, M.D.    Dg Chest Port 1 View  10/28/2012  *RADIOLOGY REPORT*  Clinical Data: Endotracheal tube.  PORTABLE CHEST - 1 VIEW  Comparison: 10/27/2012.  Findings: Endotracheal tube is in satisfactory position. Nasogastric tube is followed into the stomach.  Left subclavian central line tip projects over the SVC.  Heart size stable.  Lungs are low in volume with central pulmonary vascular congestion and mild bibasilar volume loss.  No definite pleural fluid.  IMPRESSION: Low lung volumes with central pulmonary vascular congestion and bibasilar atelectasis.   Original Report Authenticated By: Leanna Battles, M.D.    Dg Chest Port 1 View  10/27/2012  *RADIOLOGY REPORT*  Clinical Data: Intubated, line placement  PORTABLE CHEST - 1 VIEW  Comparison: 04/06/2012  Findings: Endotracheal and nasogastric tubes appropriately positioned.  Left subclavian central line tip terminates over the proximal SVC.  Heart size normal.  Lung volumes are low with crowding of the bronchovascular markings.  Retrocardiac probable atelectasis noted.  No pleural effusion.  No pneumothorax.  IMPRESSION: Appropriate support apparatus position as above.  Low volumes with probable bibasilar atelectasis.   Original Report Authenticated By: Christiana Pellant, M.D.    ECG:  No new  DIAGNOSES: Principal Problem:  *Status post laparotomy, evac of hemoperitoneum and wound vac placement 11/26 Active Problems:  H/O  Hypertension  Hypothyroid  Encephalopathy acute  Abdominal compartment syndrome, post lap 11/26  Shock due to abdominal compartment syndrome and acute blood loss  Hemoperitoneum, resolved post lap 11/26  Respiratory failure, post-operative  Acute renal insufficiency, nonoliguric   Ex lap with partial colectomy with revision of colon anastomosis, repair of  ventral hernia 11/25  ASSESSMENT / PLAN:  PULMONARY  A:  Post op vent dependence - planned return to OR 11/28.  P:   Vent settings adjusted Daily WUA/SBT Assess for extubation after return to OR   CARDIOVASCULAR  A: Shock P:  Levophed gtt, titrate to off for MAP > 60 mmHg Holding antihypertensives   Blood transfusion today    RENAL  A:  AKI, likely secondary to compartment syndrome. P:   Goal CVP>10 Trend BMP IVF to [facility] Renal service following   GASTROINTESTINAL  A:  S/p laparotomy x 2.  Abdominal compartment syndrome.  Hemoperitoneum. P:   Post op mgmt per CCS Planned return to OR 11/28 for attempted closure   HEMATOLOGIC  A:  Acute blood loss anemia. P:  Trend CBC Blood transfusion today given drop in Hct, continued shock and trip to OR today   INFECTIOUS  A:  No evidence of acute infection. P:   Cultures and antibiotics as above per CCS PCT F/u cxr given thick secretions  ENDOCRINE   A:  Hypothyroidism. P:   Cont Synthroid IV  NEUROLOGIC  A:  Acute encephalopathy.  Post surgical pain. P:   Goal RASS -1 Fentanyl / Versed gtt    CRITICAL CARE:  The patient is critically ill with multiple organ systems failure and requires high complexity decision making for assessment and support, frequent evaluation and titration of therapies, application of advanced monitoring technologies and extensive interpretation of multiple databases. Critical Care Time devoted to patient care services described in this note is 35 minutes.   Max Fickle, MD  Pulmonary and Critical Care Medicine [facility] Pager: [phone]  10/29/2012, 4:39 AM

## 2012-10-29 NOTE — Transfer of Care (Signed)
Immediate Anesthesia Transfer of Care Note  Patient: Douglas Wiggins  Procedure(s) Performed: Procedure(s) (LRB) with comments: EXPLORATORY LAPAROTOMY (N/A) - exploratory laparotomy, partial closure of abdominal wound,   APPLICATION OF WOUND VAC ()  Patient Location: SICU  Anesthesia Type:General  Level of Consciousness: sedated  Airway & Oxygen Therapy: Patient remains intubated per anesthesia plan  Post-op Assessment: Post -op Vital signs reviewed and stable  Post vital signs: stable  Complications: No apparent anesthesia complications

## 2012-10-29 NOTE — Progress Notes (Addendum)
2 Days Post-Op  Subjective: Awake on vent, stable overnight  Objective: Vital signs in last 24 hours: Temp:  [98.1 F (36.7 C)-100.1 F (37.8 C)] 99.9 F (37.7 C) (11/28 0833) Pulse Rate:  [94-114] 94  (11/28 0715) Resp:  [14-29] 14  (11/28 0715) BP: (77-128)/(36-93) 108/54 mmHg (11/28 0715) SpO2:  [91 %-100 %] 98 % (11/28 0715) Arterial Line BP: (88-132)/(43-68) 108/53 mmHg (11/28 0715) FiO2 (%):  [39.6 %-49.9 %] 39.9 % (11/28 0715) Weight:  [116.2 kg (256 lb 2.8 oz)] 116.2 kg (256 lb 2.8 oz) (11/28 0530) Last BM Date:  (preop)  Intake/Output from previous day: 11/27 0701 - 11/28 0700 In: 1934.3 [I.V.:1594.3; NG/GT:30; IV Piggyback:310] Out: 3086 [VHQIO:9629; Emesis/NG output:100; Drains:700] Intake/Output this shift:    General appearance: cooperative Resp: few rhonchi Cardio: regular rate and rhythm GI: open abdominal VAC with good seal, 350cc out overnight  Lab Results:   [facility] 10/29/12 0315 10/28/12 0410  WBC 7.5 14.7*  HGB 7.4* 9.0*  HCT 21.1* 26.3*  PLT 147* 222   BMET  Basename 10/29/12 0315 10/28/12 0410  NA 137 136  K 4.5 4.8  CL 107 104  CO2 23 21  GLUCOSE 103* 147*  BUN 33* 35*  CREATININE 2.01* 3.18*  CALCIUM 8.3* 7.8*   PT/INR No results found for this basename: LABPROT:2,INR:2 in the last 72 hours ABG  Basename 10/29/12 0547 10/28/12 0540  PHART 7.326* 7.360  HCO3 22.3 21.6    Studies/Results: US Renal Port  10/28/2012  *RADIOLOGY REPORT*  Clinical Data: Renal insufficiency, hypertension  RENAL/URINARY TRACT ULTRASOUND COMPLETE  Comparison:  06/16/2012  Findings:  Right Kidney:  12.6 cm length.  Normal cortex and echogenicity.  No hydronephrosis or focal abnormality.  Left Kidney:  13.1 cm length.  Normal cortex and echogenicity.  No hydronephrosis or focal abnormality.  Bladder:  Decompressed by Foley catheter.  IMPRESSION: No acute finding or hydronephrosis.   Original Report Authenticated By: Judie Petit. Miles Costain, M.D.    Dg Chest Port 1  View  10/28/2012  *RADIOLOGY REPORT*  Clinical Data: Endotracheal tube.  PORTABLE CHEST - 1 VIEW  Comparison: 10/27/2012.  Findings: Endotracheal tube is in satisfactory position. Nasogastric tube is followed into the stomach.  Left subclavian central line tip projects over the SVC.  Heart size stable.  Lungs are low in volume with central pulmonary vascular congestion and mild bibasilar volume loss.  No definite pleural fluid.  IMPRESSION: Low lung volumes with central pulmonary vascular congestion and bibasilar atelectasis.   Original Report Authenticated By: Leanna Battles, M.D.    Dg Chest Port 1 View  10/27/2012  *RADIOLOGY REPORT*  Clinical Data: Intubated, line placement  PORTABLE CHEST - 1 VIEW  Comparison: 04/06/2012  Findings: Endotracheal and nasogastric tubes appropriately positioned.  Left subclavian central line tip terminates over the proximal SVC.  Heart size normal.  Lung volumes are low with crowding of the bronchovascular markings.  Retrocardiac probable atelectasis noted.  No pleural effusion.  No pneumothorax.  IMPRESSION: Appropriate support apparatus position as above.  Low volumes with probable bibasilar atelectasis.   Original Report Authenticated By: Christiana Pellant, M.D.     Anti-infectives: Anti-infectives     Start     Dose/Rate Route Frequency Ordered Stop   10/28/12 2200   imipenem-cilastatin (PRIMAXIN) 500 mg in sodium chloride 0.9 % 100 mL IVPB        500 mg 200 mL/hr over 30 Minutes Intravenous 3 times per day 10/28/12 1217     10/28/12 1930  vancomycin (VANCOCIN) 1,500 mg in sodium chloride 0.9 % 500 mL IVPB        1,500 mg 250 mL/hr over 120 Minutes Intravenous Every 24 hours 10/27/12 1758     10/27/12 1930   vancomycin (VANCOCIN) 2,000 mg in sodium chloride 0.9 % 500 mL IVPB        2,000 mg 250 mL/hr over 120 Minutes Intravenous  Once 10/27/12 1757 10/27/12 2124   10/27/12 1830   imipenem-cilastatin (PRIMAXIN) 250 mg in sodium chloride 0.9 % 100 mL IVPB         250 mg 200 mL/hr over 30 Minutes Intravenous 4 times per day 10/27/12 1758 10/28/12 1247   10/27/12 1330   ceFAZolin (ANCEF) IVPB 2 g/50 mL premix        2 g 100 mL/hr over 30 Minutes Intravenous  Once 10/27/12 1300 10/27/12 1501   10/26/12 1600   cefOXitin (MEFOXIN) 1 g in dextrose 5 % 50 mL IVPB        1 g 100 mL/hr over 30 Minutes Intravenous Every 6 hours 10/26/12 1517 10/27/12 0417   10/26/12 0600   ceFAZolin (ANCEF) 3 g in dextrose 5 % 50 mL IVPB        3 g 160 mL/hr over 30 Minutes Intravenous On call to O.R. 10/25/12 1337 10/26/12 0915          Assessment/Plan: s/p Procedure(s) (LRB) with comments: EXPLORATORY LAPAROTOMY (N/A) s/p revision of colon anastomosis; primary repair of ventral hernia; post-op hemoperitoneum with abdominal compartment syndrome Back to OR today for possible closure per Dr. [name], consent signed by family ABL anemia down some more - will see how surgery goes and transfuse as needed, 2u are available AKI - significant improvement  LOS: 3 days    Twisha Vanpelt E 10/29/2012

## 2012-10-29 NOTE — Anesthesia Postprocedure Evaluation (Signed)
  Anesthesia Post-op Note  Patient: Douglas Wiggins  Procedure(s) Performed: Procedure(s) (LRB) with comments: EXPLORATORY LAPAROTOMY (N/A) - exploratory laparotomy, partial closure of abdominal wound,   APPLICATION OF WOUND VAC ()  Patient Location: ICU  Anesthesia Type:General  Level of Consciousness: sedated  Airway and Oxygen Therapy: Patient remains intubated per anesthesia plan  Post-op Pain: none  Post-op Assessment: Post-op Vital signs reviewed, Patient's Cardiovascular Status Stable and Respiratory Function Stable  Post-op Vital Signs: Reviewed and stable  Complications: No apparent anesthesia complications

## 2012-10-29 NOTE — Brief Op Note (Signed)
10/26/2012 - 10/29/2012  11:40 AM  PATIENT:  Douglas Wiggins  46 y.o. male  PRE-OPERATIVE DIAGNOSIS:  Open Abdominal Incision  POST-OPERATIVE DIAGNOSIS:  Open Abdominal Incision   PROCEDURE:  Procedure(s) (LRB) with comments: EXPLORATORY LAPAROTOMY (N/A) - exploratory laparotomy, partial closure of abdominal wound,   APPLICATION OF WOUND VAC ()  SURGEON:  Surgeon(s) and Role:    [name]. [name], MD - Primary    * Liz Malady, MD - Assisting  PHYSICIAN ASSISTANT:   ASSISTANTSJanee Morn   ANESTHESIA:   general  EBL:  Total I/O In: 1350 [I.V.:1000; Blood:350] Out: 290 [Urine:260; Drains:30]  BLOOD ADMINISTERED:375 CC PRBC  DRAINS: VAC in midline wound   LOCAL MEDICATIONS USED:  NONE  SPECIMEN:  No Specimen  DISPOSITION OF SPECIMEN:  N/A  COUNTS:  YES  TOURNIQUET:  * No tourniquets in log *  DICTATION: .Dragon Dictation  PLAN OF CARE: Admit to inpatient   PATIENT DISPOSITION:  PACU - hemodynamically stable.   Delay start of Pharmacological VTE agent (>24hrs) due to surgical blood loss or risk of bleeding: yes  [name]. [name], MD, [facility]  10/29/2012 11:41 AM

## 2012-10-29 NOTE — Progress Notes (Signed)
Foley catheter occluded.  MD MxQuaid at bedside and made aware.  Order given to replace foley catheter.

## 2012-10-29 NOTE — Anesthesia Preprocedure Evaluation (Signed)
Anesthesia Evaluation  Patient identified by MRN, date of birth, ID bandGeneral Assessment Comment:Pt sedated on midazolam and fentanyl  Reviewed: Allergy & Precautions, H&P , NPO status , Patient's Chart, lab work & pertinent test results  Airway      Comment: Already intubated Dental   Pulmonary sleep apnea ,          Cardiovascular hypertension,     Neuro/Psych Depression    GI/Hepatic GERD-  ,  Endo/Other  Hypothyroidism   Renal/GU ARFRenal disease     Musculoskeletal   Abdominal   Peds  Hematology   Anesthesia Other Findings   Reproductive/Obstetrics                           Anesthesia Physical Anesthesia Plan  ASA: III  Anesthesia Plan: General   Post-op Pain Management:    Induction: Intravenous  Airway Management Planned: Oral ETT  Additional Equipment:   Intra-op Plan:   Post-operative Plan: Post-operative intubation/ventilation  Informed Consent: I have reviewed the patients History and Physical, chart, labs and discussed the procedure including the risks, benefits and alternatives for the proposed anesthesia with the patient or authorized representative who has indicated his/her understanding and acceptance.     Plan Discussed with: CRNA and Surgeon  Anesthesia Plan Comments:         Anesthesia Quick Evaluation

## 2012-10-29 NOTE — Progress Notes (Signed)
Subjective:  Events noted.  Weaning pressors.  Fortunately UOP is good and creatinine is trending down.  Had to have foley changed out for obstruction.  Going back to OR today.  Objective Vital signs in last 24 hours: Filed Vitals:   10/29/12 0700 10/29/12 0715 10/29/12 0800 10/29/12 0833  BP: 111/50 108/54 105/58   Pulse: 96 94 92   Temp:    99.9 F (37.7 C)  TempSrc:    Axillary  Resp: 17 14 15    Height:      Weight:      SpO2: 99% 98% 99%    Weight change: 1 kg (2 lb 3.3 oz)  Intake/Output Summary (Last 24 hours) at 10/29/12 0848 Last data filed at 10/29/12 0700  Gross per 24 hour  Intake 1748.83 ml  Output   3200 ml  Net -1451.17 ml   Labs: Basic Metabolic Panel:  Lab 10/29/12 1308 10/28/12 0410 10/27/12 1724  NA 137 136 134*  K 4.5 4.8 4.9  CL 107 104 102  CO2 23 21 21   GLUCOSE 103* 147* 123*  BUN 33* 35* 32*  CREATININE 2.01* 3.18* 3.62*  CALCIUM 8.3* 7.8* 7.8*  ALB -- -- --  PHOS 2.6 -- --   Liver Function Tests:  Lab 10/29/12 0315 10/28/12 0410  AST -- 608*  ALT -- 819*  ALKPHOS -- 54  BILITOT -- 0.4  PROT -- 5.3*  ALBUMIN 2.3* 2.6*   No results found for this basename: LIPASE:3,AMYLASE:3 in the last 168 hours No results found for this basename: AMMONIA:3 in the last 168 hours CBC:  Lab 10/29/12 0315 10/28/12 0410 10/27/12 1800 10/27/12 0650 10/26/12 1541  WBC 7.5 14.7* 8.4 -- --  NEUTROABS -- -- -- -- --  HGB 7.4* 9.0* 9.9* -- --  HCT 21.1* 26.3* 28.5* -- --  MCV 87.9 86.5 86.6 88.0 86.2  PLT 147* 222 207 -- --   Cardiac Enzymes: No results found for this basename: CKTOTAL:5,CKMB:5,CKMBINDEX:5,TROPONINI:5 in the last 168 hours CBG: No results found for this basename: GLUCAP:5 in the last 168 hours  Iron Studies: No results found for this basename: IRON,TIBC,TRANSFERRIN,FERRITIN in the last 72 hours Studies/Results: US Renal Port  10/28/2012  *RADIOLOGY REPORT*  Clinical Data: Renal insufficiency, hypertension  RENAL/URINARY TRACT  ULTRASOUND COMPLETE  Comparison:  06/16/2012  Findings:  Right Kidney:  12.6 cm length.  Normal cortex and echogenicity.  No hydronephrosis or focal abnormality.  Left Kidney:  13.1 cm length.  Normal cortex and echogenicity.  No hydronephrosis or focal abnormality.  Bladder:  Decompressed by Foley catheter.  IMPRESSION: No acute finding or hydronephrosis.   Original Report Authenticated By: Judie Petit. Miles Costain, M.D.    Dg Chest Port 1 View  10/28/2012  *RADIOLOGY REPORT*  Clinical Data: Endotracheal tube.  PORTABLE CHEST - 1 VIEW  Comparison: 10/27/2012.  Findings: Endotracheal tube is in satisfactory position. Nasogastric tube is followed into the stomach.  Left subclavian central line tip projects over the SVC.  Heart size stable.  Lungs are low in volume with central pulmonary vascular congestion and mild bibasilar volume loss.  No definite pleural fluid.  IMPRESSION: Low lung volumes with central pulmonary vascular congestion and bibasilar atelectasis.   Original Report Authenticated By: Leanna Battles, M.D.    Dg Chest Port 1 View  10/27/2012  *RADIOLOGY REPORT*  Clinical Data: Intubated, line placement  PORTABLE CHEST - 1 VIEW  Comparison: 04/06/2012  Findings: Endotracheal and nasogastric tubes appropriately positioned.  Left subclavian central line tip terminates over  the proximal SVC.  Heart size normal.  Lung volumes are low with crowding of the bronchovascular markings.  Retrocardiac probable atelectasis noted.  No pleural effusion.  No pneumothorax.  IMPRESSION: Appropriate support apparatus position as above.  Low volumes with probable bibasilar atelectasis.   Original Report Authenticated By: Christiana Pellant, M.D.    Medications: Infusions:    . dextrose 5 % and 0.9% NaCl 1,000 mL infusion 20 mL/hr at 10/28/12 1900  . fentaNYL infusion INTRAVENOUS 250 mcg/hr (10/29/12 9604)  . midazolam (VERSED) infusion 2 mg/hr (10/29/12 0400)  . norepinephrine (LEVOPHED) Adult infusion Stopped (10/29/12 0600)      Scheduled Medications:    . antiseptic oral rinse  15 mL Mouth Rinse q12n4p  . chlorhexidine  15 mL Mouth/Throat BID  . Chlorhexidine Gluconate Cloth  6 each Topical Daily  . [COMPLETED] imipenem-cilastatin  250 mg Intravenous Q6H  . imipenem-cilastatin  500 mg Intravenous Q8H  . levothyroxine  26 mcg Intravenous Daily  . mupirocin ointment  1 application Nasal BID  . pantoprazole (PROTONIX) IV  40 mg Intravenous Q24H  . vancomycin  1,500 mg Intravenous Q24H    have reviewed scheduled and prn medications.  Physical Exam: General: sedated but opening eyes to stimuli Heart: tachy Lungs: CBS bilaterally Abdomen:distended, vac in place Extremities: some dependant pitting edema    Assessment/ Plan: Pt is a 46 y.o. yo male who was admitted on 10/26/2012 with  Complicated abdominal surgery now with AKI  Assessment/Plan: 1. Renal- AKI after complicated abdominal surgery- likely multifactorial due to ATN/hypotension with previous ACE and ARB, also element of analgesic nephropathy (mobic/toradol) and increased intraabdominal pressure.  Fortunately seems to be turning the corner at least as far as kidney function.  No indications for HD  And creatinine is down.  Kidney function completely normal at baseline so is helping him here.   2. Volume- receiving IVF per CCM, CVP recorded at 12-13, hypotensive.  OK to give IVF to keep CVP up 3. Anemia- drop not surprising given 2 surgeries.  For third today, likely will need some blood, per surgery and CCM  Since going back to OR today, will come and see tomorrow.  Hopefully will continue to recover renal function.     Yecenia Dalgleish A   10/29/2012,8:48 AM  LOS: 3 days

## 2012-10-29 NOTE — Progress Notes (Addendum)
ANTIBIOTIC CONSULT NOTE - FOLLOW UP  Pharmacy Consult for Imipenem, Vancomycin Indication: empiric for abdominal coverage  Allergies  Allergen Reactions  . Sulfa Antibiotics Hives and Rash    Patient Measurements: Height: 6\' 4"  (193 cm) Weight: 256 lb 2.8 oz (116.2 kg) IBW/kg (Calculated) : 86.8   Vital Signs: Temp: 99.9 F (37.7 C) (11/28 0833) Temp src: Axillary (11/28 0833) BP: 105/58 mmHg (11/28 0800) Pulse Rate: 92  (11/28 0800) Intake/Output from previous day: 11/27 0701 - 11/28 0700 In: 1934.3 [I.V.:1594.3; NG/GT:30; IV Piggyback:310] Out: 1610 [RUEAV:4098; Emesis/NG output:100; Drains:700] Intake/Output from this shift: Total I/O In: 50 [I.V.:50] Out: 190 [Urine:160; Drains:30]  Labs:  [facility] 10/29/12 0315 10/28/12 0410 10/27/12 1800 10/27/12 1724  WBC 7.5 14.7* 8.4 --  HGB 7.4* 9.0* 9.9* --  PLT 147* 222 207 --  LABCREA -- -- -- --  CREATININE 2.01* 3.18* -- 3.62*   Estimated Creatinine Clearance: 65.4 ml/min (by C-G formula based on Cr of 2.01). No results found for this basename: VANCOTROUGH:2,VANCOPEAK:2,VANCORANDOM:2,GENTTROUGH:2,GENTPEAK:2,GENTRANDOM:2,TOBRATROUGH:2,TOBRAPEAK:2,TOBRARND:2,AMIKACINPEAK:2,AMIKACINTROU:2,AMIKACIN:2, in the last 72 hours   Microbiology: Recent Results (from the past 720 hour(s))  SURGICAL PCR SCREEN     Status: Abnormal   Collection Time   10/19/12  9:28 AM      Component Value Range Status Comment   MRSA, PCR NEGATIVE  NEGATIVE Final    Staphylococcus aureus POSITIVE (*) NEGATIVE Final   URINE CULTURE     Status: Normal   Collection Time   10/27/12  5:14 PM      Component Value Range Status Comment   Specimen Description URINE, CATHETERIZED   Final    Special Requests NONE   Final    Culture  Setup Time 10/27/2012 21:12   Final    Colony Count NO GROWTH   Final    Culture NO GROWTH   Final    Report Status 10/29/2012 FINAL   Final     Anti-infectives     Start     Dose/Rate Route Frequency Ordered Stop   10/28/12 2200   imipenem-cilastatin (PRIMAXIN) 500 mg in sodium chloride 0.9 % 100 mL IVPB        500 mg 200 mL/hr over 30 Minutes Intravenous 3 times per day 10/28/12 1217     10/28/12 1930   vancomycin (VANCOCIN) 1,500 mg in sodium chloride 0.9 % 500 mL IVPB        1,500 mg 250 mL/hr over 120 Minutes Intravenous Every 24 hours 10/27/12 1758     10/27/12 1930   vancomycin (VANCOCIN) 2,000 mg in sodium chloride 0.9 % 500 mL IVPB        2,000 mg 250 mL/hr over 120 Minutes Intravenous  Once 10/27/12 1757 10/27/12 2124   10/27/12 1830   imipenem-cilastatin (PRIMAXIN) 250 mg in sodium chloride 0.9 % 100 mL IVPB        250 mg 200 mL/hr over 30 Minutes Intravenous 4 times per day 10/27/12 1758 10/28/12 1247   10/27/12 1330   ceFAZolin (ANCEF) IVPB 2 g/50 mL premix        2 g 100 mL/hr over 30 Minutes Intravenous  Once 10/27/12 1300 10/27/12 1501   10/26/12 1600   cefOXitin (MEFOXIN) 1 g in dextrose 5 % 50 mL IVPB        1 g 100 mL/hr over 30 Minutes Intravenous Every 6 hours 10/26/12 1517 10/27/12 0417   10/26/12 0600   ceFAZolin (ANCEF) 3 g in dextrose 5 % 50 mL IVPB  3 g 160 mL/hr over 30 Minutes Intravenous On call to O.R. 10/25/12 1337 10/26/12 0915          Assessment: Abdominal compartment syndrome - initiated on empiric antibiotic coverage with Vancomycin and Primaxin 11/26. WBC are now wnl, Tmax of 99.9, PCT down to 0.68. His renal function is improving and his Vancomycin regimen requires adjustment today.   Goal of Therapy:  Vancomycin trough level 15-20 mcg/ml  Plan:  Continue Primaxin to 500mg  IV q8h Change Vancomycin to 1000mg  IV q12h Monitor renal function closely  Thank you, Franchot Erichsen, Pharm.D. Clinical Pharmacist   10/29/2012 9:11 AM   Addendum: Repeat Scr 1.5; CrCl ~ 83 ml/min Will adjust imipenem to 500 mg q 6 hrs.  Reece Leader, Pharm D 10/29/2012 8:23 PM

## 2012-10-30 ENCOUNTER — Inpatient Hospital Stay (HOSPITAL_COMMUNITY): Payer: BC Managed Care – PPO

## 2012-10-30 LAB — CBC
MCV: 88.3 fL (ref 78.0–100.0)
Platelets: 179 10*3/uL (ref 150–400)
RBC: 2.91 MIL/uL — ABNORMAL LOW (ref 4.22–5.81)
WBC: 7.4 10*3/uL (ref 4.0–10.5)

## 2012-10-30 LAB — POCT I-STAT 7, (LYTES, BLD GAS, ICA,H+H)
Acid-base deficit: 5 mmol/L — ABNORMAL HIGH (ref 0.0–2.0)
Bicarbonate: 20.8 mEq/L (ref 20.0–24.0)
Calcium, Ion: 1.1 mmol/L — ABNORMAL LOW (ref 1.12–1.23)
Hemoglobin: 9.9 g/dL — ABNORMAL LOW (ref 13.0–17.0)
O2 Saturation: 99 %
Patient temperature: 36
Sodium: 139 mEq/L (ref 135–145)
TCO2: 22 mmol/L (ref 0–100)
TCO2: 21 mmol/L (ref 0–100)
pH, Arterial: 7.269 — ABNORMAL LOW (ref 7.350–7.450)
pO2, Arterial: 59 mmHg — ABNORMAL LOW (ref 80.0–100.0)
pO2, Arterial: 173 mmHg — ABNORMAL HIGH (ref 80.0–100.0)

## 2012-10-30 LAB — RENAL FUNCTION PANEL
Albumin: 2.3 g/dL — ABNORMAL LOW (ref 3.5–5.2)
CO2: 24 mEq/L (ref 19–32)
Calcium: 8.5 mg/dL (ref 8.4–10.5)
Creatinine, Ser: 1.36 mg/dL — ABNORMAL HIGH (ref 0.50–1.35)
GFR calc non Af Amer: 62 mL/min — ABNORMAL LOW (ref >90)

## 2012-10-30 MED ORDER — HALOPERIDOL LACTATE 5 MG/ML IJ SOLN
INTRAMUSCULAR | Status: AC
  Filled 2012-10-30: qty 1

## 2012-10-30 MED ORDER — HALOPERIDOL LACTATE 5 MG/ML IJ SOLN
5.0000 mg | INTRAMUSCULAR | Status: DC | PRN
  Administered 2012-10-30 – 2012-10-31 (×5): 5 mg via INTRAVENOUS
  Filled 2012-10-30 (×4): qty 1

## 2012-10-30 MED ORDER — NALOXONE HCL 0.4 MG/ML IJ SOLN
INTRAMUSCULAR | Status: AC
  Filled 2012-10-30: qty 1

## 2012-10-30 MED ORDER — NALOXONE HCL 0.4 MG/ML IJ SOLN
INTRAMUSCULAR | Status: AC
  Administered 2012-10-30: 0.4 mg
  Filled 2012-10-30: qty 1

## 2012-10-30 MED ORDER — FENTANYL CITRATE 0.05 MG/ML IJ SOLN
100.0000 ug | INTRAMUSCULAR | Status: DC | PRN
  Administered 2012-10-30 – 2012-11-02 (×16): 100 ug via INTRAVENOUS
  Filled 2012-10-30 (×16): qty 2

## 2012-10-30 MED ORDER — LEVOTHYROXINE SODIUM 100 MCG IV SOLR
37.5000 ug | Freq: Every day | INTRAVENOUS | Status: DC
  Administered 2012-10-30 – 2012-11-02 (×4): 38 ug via INTRAVENOUS
  Filled 2012-10-30 (×4): qty 1.9

## 2012-10-30 NOTE — Procedures (Signed)
**Note De-Identified [facility] Adduci Obfuscation** Extubation Procedure Note  Patient Details:   Name: Douglas Wiggins DOB: Oct 04, 1966 MRN: [id number]   Airway Documentation:  Airway 8 mm (Active)  Secured at (cm) 25 cm 10/30/2012  8:20 AM  Measured From Lips 10/30/2012  8:20 AM  Secured Location Left 10/30/2012  8:20 AM  Secured By [employer] 10/30/2012  8:20 AM  Tube Holder Repositioned Yes 10/30/2012  8:20 AM  Cuff Pressure (cm H2O) 20 cm H2O 10/30/2012  8:20 AM  Site Condition Dry 10/29/2012  7:50 PM    Evaluation  O2 sats: stable throughout Complications: No apparent complications Patient did tolerate procedure well. Bilateral Breath Sounds: Rhonchi Suctioning: Oral;Airway No Patient self extubated Jaziah Goeller, Megan Salon 10/30/2012, 10:28 AM

## 2012-10-30 NOTE — Progress Notes (Signed)
PULMONARY  / CRITICAL CARE MEDICINE  Name: Douglas Wiggins MRN: [id number] DOB: Aug 13, 1966    LOS: 4  REFERRING MD:  [name])  CHIEF COMPLAINT:  Post op respiratory failure  BRIEF PATIENT DESCRIPTION: 46 yo admitted 11/24 for exploratory laparotomy, partial colectomy with revision of colon anastomosis and primary repair of ventral hernia on 11/25.  Course was complicated by hemoperitoneum and abdominal compartment syndrome with repeated laparotomy on 11/26.  Brought to ICU hypoxic, mechanically ventilated.  LINES / TUBES: ETT 11/26 >>  L [place] CVL 11/26 >>> R rad A line 11/26 >>> Foley 11/26 >>>  CULTURES: 11/26  Urine >>> ntd  ANTIBIOTICS: Primaxin 11/26 >>> 11/29 Vancomycin 11/26 >>> 11/29  SIGNIFICANT EVENTS:  11/24  Admitted for elective surgery 11/25  Exploratory laparotomy, partial colectomy with revision of colon anastomosis and primary repair of ventral hernia 11/26  Repeated laparotomy for abdominal compartment syndrome, brought to ICU hypoxic, mechanically ventilated 11/28  Fascia is closed, abx are d/c'd, weaning started  LEVEL OF CARE:  ICU  PRIMARY SERVICE:  CCS  CONSULTANTS:  PCCM, Renal  CODE STATUS:  Full  DIET:  NPO  DVT Px:  SCDs  GI Px:  Protonix  INTERVAL HISTORY:  Fascia closed.  Antibiotics s/c'd.  VITAL SIGNS: Temp:  [98 F (36.7 C)-99.8 F (37.7 C)] 98 F (36.7 C) (11/29 0801) Pulse Rate:  [92-103] 99  (11/29 0900) Resp:  [11-26] 17  (11/29 0900) BP: (104)/(49) 104/49 mmHg (11/29 0820) SpO2:  [95 %-100 %] 99 % (11/29 0900) Arterial Line BP: (96-144)/(46-66) 116/58 mmHg (11/29 0900) FiO2 (%):  [39.4 %-40.4 %] 40.2 % (11/29 0900) Weight:  [117.4 kg (258 lb 13.1 oz)] 117.4 kg (258 lb 13.1 oz) (11/29 0600)  HEMODYNAMICS: CVP:  [5 mmHg-7 mmHg] 7 mmHg  VENTILATOR SETTINGS: Vent Mode:  [-] PRVC FiO2 (%):  [39.4 %-40.4 %] 40.2 % Set Rate:  [14 bmp] 14 bmp Vt Set:  [550 mL] 550 mL PEEP:  [4.4 cmH20-5 cmH20] 5 cmH20 Plateau Pressure:   [0 cmH20-19 cmH20] 14 cmH20  INTAKE / OUTPUT: Intake/Output      11/28 0701 - 11/29 0700 11/29 0701 - 11/30 0700   I.V. (mL/kg) 1808 (15.4) 54 (0.5)   Blood 350    NG/GT     IV Piggyback 500    Total Intake(mL/kg) 2658 (22.6) 54 (0.5)   Urine (mL/kg/hr) 3035 (1.1) 475   Emesis/NG output     Drains 130 0   Total Output 3165 475   Net -507 -421         PHYSICAL EXAMINATION: General:  No distress PULM: Bilateral air entry CV: Tachy, regular, loud systolic murmur AB: Diminished bowel sounds Ext: Aarm, trace edema Neuro: Awake, nods head to voice, follows commands  LABS:  Lab 10/30/12 0900 10/30/12 0430 10/29/12 1325 10/29/12 1300 10/29/12 1030 10/29/12 0547 10/29/12 0315 10/28/12 0935 10/28/12 0540 10/28/12 0410 10/28/12 0335 10/27/12 2141 10/27/12 2135  HGB 8.7* -- -- 8.9* 6.1* -- -- -- -- -- -- -- --  WBC 7.4 -- -- 7.2 -- -- 7.5 -- -- -- -- -- --  PLT 179 -- -- 151 -- -- 147* -- -- -- -- -- --  NA -- 140 136 -- 144 -- -- -- -- -- -- -- --  K -- 4.1 4.2 -- -- -- -- -- -- -- -- -- --  CL -- 107 105 -- -- -- 107 -- -- -- -- -- --  CO2 -- 24 23 -- -- --  23 -- -- -- -- -- --  GLUCOSE -- 103* 101* -- -- -- 103* -- -- -- -- -- --  BUN -- 23 28* -- -- -- 33* -- -- -- -- -- --  CREATININE -- 1.36* 1.58* -- -- -- 2.01* -- -- -- -- -- --  CALCIUM -- 8.5 8.3* -- -- -- 8.3* -- -- -- -- -- --  MG -- -- -- -- -- -- -- -- -- -- -- -- --  PHOS -- 2.9 -- -- -- -- 2.6 -- -- -- -- -- --  AST -- -- -- -- -- -- -- -- -- 608* -- -- --  ALT -- -- -- -- -- -- -- -- -- 819* -- -- --  ALKPHOS -- -- -- -- -- -- -- -- -- 54 -- -- --  BILITOT -- -- -- -- -- -- -- -- -- 0.4 -- -- --  PROT -- -- -- -- -- -- -- -- -- 5.3* -- -- --  ALBUMIN -- 2.3* -- -- -- -- 2.3* -- -- 2.6* -- -- --  APTT -- -- -- -- -- -- -- -- -- -- -- -- --  INR -- -- -- -- -- -- -- -- -- -- -- -- --  LATICACIDVEN -- -- -- -- -- -- -- 1.2 -- -- 1.7 -- 1.3  TROPONINI -- -- -- -- -- -- -- -- -- -- -- -- --  PROCALCITON -- -- -- --  -- -- 0.68 -- -- 2.35 -- 2.68 --  PROBNP -- -- -- -- -- -- -- -- -- -- -- -- --  O2SATVEN -- -- -- -- -- -- -- -- -- -- -- -- --  PHART -- -- -- -- 7.337* 7.326* -- -- 7.360 -- -- -- --  PCO2ART -- -- -- -- 37.7 43.2 -- -- 38.3 -- -- -- --  PO2ART -- -- -- -- 173.0* 81.0 -- -- 115.0* -- -- -- --   No results found for this basename: GLUCAP:5 in the last 168 hours  IMAGING:  Dg Chest Port 1 View  10/30/2012  *RADIOLOGY REPORT*  Clinical Data: Evaluate endotracheal tube  PORTABLE CHEST - 1 VIEW  Comparison: 10/29/2012; 10/28/2012; 10/27/2012  Findings:  Grossly unchanged cardiac silhouette and mediastinal contours. Stable positioning of support apparatus.  The pulmonary vasculature remains indistinct.  Small layering bilateral effusions are unchanged.  Grossly unchanged perihilar and basilar opacities.  No new focal airspace opacities.  No pneumothorax.  Unchanged bones.  IMPRESSION: 1.  Stable positioning of support apparatus.  No pneumothorax. 2.  Grossly unchanged findings of pulmonary edema, small layering bilateral effusions and perihilar/basilar opacities, likely atelectasis.   Original Report Authenticated By: Tacey Ruiz, MD    Dg Chest Port 1 View  10/29/2012  *RADIOLOGY REPORT*  Clinical Data: Respiratory failure.  PORTABLE CHEST - 1 VIEW  Comparison: 10/28/2012.  Findings: The support apparatus is stable.  Lower lung volumes with increasing vascular crowding and bibasilar atelectasis.  Mild central vascular congestion without overt pulmonary edema. Bilateral pleural effusions are present, right greater than left.  IMPRESSION:  1.  Stable support apparatus. 2.  Slight increase in bibasilar atelectasis and small effusions.   Original Report Authenticated By: Rudie Meyer, M.D.    ECG:  No new  DIAGNOSES: Principal Problem:  *Status post laparotomy, evac of hemoperitoneum and wound vac placement 11/26 Active Problems:  H/O Hypertension  Hypothyroid  Encephalopathy acute  Abdominal  compartment syndrome, post lap 11/26  Shock due to abdominal compartment syndrome and acute blood loss  Hemoperitoneum, resolved post lap 11/26  Respiratory failure, post-operative  Acute renal insufficiency, nonoliguric   Ex lap with partial colectomy with revision of colon anastomosis, repair of ventral hernia 11/25  ASSESSMENT / PLAN:  PULMONARY  A:  Post op respiratory failure. P:   SBT with intent to extubate Goal SPO2 > 92 Albuterol PRN  CARDIOVASCULAR  A: Shock, resolved. P:  Holding antihypertensives    RENAL  A:  AKI, likely secondary to compartment syndrome, improving.  Renal signed off. P:   Goal CVP>10 Trend BMP IVF D5W @ 50  GASTROINTESTINAL  A:  S/p laparotomy x 2.  Abdominal compartment syndrome.  Hemoperitoneum.  Fascia closed 11/29. P:   Post op mgmt per CCS  HEMATOLOGIC  A:  Acute blood loss anemia, stable. P:  Trend CBC  INFECTIOUS  A:  No evidence of acute infection. P:   Antibiotics d/c'd by CCS  ENDOCRINE   A:  Hypothyroidism. P:   Synthroid IV  NEUROLOGIC  A:  Acute encephalopathy.  Post surgical pain. P:   D/c Fentanyl / Versed as anticipating extubation Start Fentanyl PRN pain  CLINICAL SUMMARY:  46 yo admitted 11/24 for exploratory laparotomy, partial colectomy with revision of colon anastomosis and primary repair of ventral hernia on 11/25.  Course was complicated by hemoperitoneum and abdominal compartment syndrome with repeated laparotomy on 11/26.  Brought to ICU hypoxic, mechanically ventilated.  Fascia closed 11/28.  Now off pressors / antibiotics, hemodynamically stable.  Will wean with intent to extubate.  CRITICAL CARE:  The patient is critically ill with multiple organ systems failure and requires high complexity decision making for assessment and support, frequent evaluation and titration of therapies, application of advanced monitoring technologies and extensive interpretation of multiple databases. Critical Care  Time devoted to patient care services described in this note is 35 minutes.   Lonia Farber, MD  Pulmonary and Critical Care Medicine [facility] Pager: [phone]  10/30/2012, 9:45 AM

## 2012-10-30 NOTE — Progress Notes (Signed)
Called by RN regarding patient pulling on central line.  Assessed at bedside - patient pulled distal port cap off leaving line unable to be used / open access for infection.  BP stable, not intubated, off pressors.    PLAN: -d/c central line -establish second PIV   Canary Brim, NP-C [facility] Pgr: [phone] or [phone]

## 2012-10-30 NOTE — Progress Notes (Signed)
Subjective:  S/p anothor trip to the OR for wound.  BP has still been soft but thankfully since second surgery UOP has been good and creatinine had trended down   Objective Vital signs in last 24 hours: Filed Vitals:   10/30/12 0427 10/30/12 0500 10/30/12 0600 10/30/12 0700  BP:      Pulse:  97 99 98  Temp: 99.6 F (37.6 C)     TempSrc: Oral     Resp:  26 19 16   Height:      Weight:   117.4 kg (258 lb 13.1 oz)   SpO2:  97% 98% 98%   Weight change: 1.2 kg (2 lb 10.3 oz)  Intake/Output Summary (Last 24 hours) at 10/30/12 0749 Last data filed at 10/30/12 0700  Gross per 24 hour  Intake   2658 ml  Output   3165 ml  Net   -507 ml   Labs: Basic Metabolic Panel:  Lab 10/30/12 1914 10/29/12 1325 10/29/12 1030 10/29/12 0315  NA 140 136 144 --  K 4.1 4.2 3.9 --  CL 107 105 -- 107  CO2 24 23 -- 23  GLUCOSE 103* 101* -- 103*  BUN 23 28* -- 33*  CREATININE 1.36* 1.58* -- 2.01*  CALCIUM 8.5 8.3* -- 8.3*  ALB -- -- -- --  PHOS 2.9 -- -- 2.6   Liver Function Tests:  Lab 10/30/12 0430 10/29/12 0315 10/28/12 0410  AST -- -- 608*  ALT -- -- 819*  ALKPHOS -- -- 54  BILITOT -- -- 0.4  PROT -- -- 5.3*  ALBUMIN 2.3* 2.3* 2.6*   No results found for this basename: LIPASE:3,AMYLASE:3 in the last 168 hours No results found for this basename: AMMONIA:3 in the last 168 hours CBC:  Lab 10/29/12 1300 10/29/12 1030 10/29/12 0315 10/28/12 0410 10/27/12 1800 10/27/12 0650  WBC 7.2 -- 7.5 14.7* -- --  NEUTROABS -- -- -- -- -- --  HGB 8.9* 6.1* 7.4* -- -- --  HCT 26.4* 18.0* 21.1* -- -- --  MCV 87.1 -- 87.9 86.5 86.6 88.0  PLT 151 -- 147* 222 -- --   Cardiac Enzymes: No results found for this basename: CKTOTAL:5,CKMB:5,CKMBINDEX:5,TROPONINI:5 in the last 168 hours CBG: No results found for this basename: GLUCAP:5 in the last 168 hours  Iron Studies: No results found for this basename: IRON,TIBC,TRANSFERRIN,FERRITIN in the last 72 hours Studies/Results: US Renal Port  10/28/2012   *RADIOLOGY REPORT*  Clinical Data: Renal insufficiency, hypertension  RENAL/URINARY TRACT ULTRASOUND COMPLETE  Comparison:  06/16/2012  Findings:  Right Kidney:  12.6 cm length.  Normal cortex and echogenicity.  No hydronephrosis or focal abnormality.  Left Kidney:  13.1 cm length.  Normal cortex and echogenicity.  No hydronephrosis or focal abnormality.  Bladder:  Decompressed by Foley catheter.  IMPRESSION: No acute finding or hydronephrosis.   Original Report Authenticated By: Judie Petit. Miles Costain, M.D.    Dg Chest Port 1 View  10/29/2012  *RADIOLOGY REPORT*  Clinical Data: Respiratory failure.  PORTABLE CHEST - 1 VIEW  Comparison: 10/28/2012.  Findings: The support apparatus is stable.  Lower lung volumes with increasing vascular crowding and bibasilar atelectasis.  Mild central vascular congestion without overt pulmonary edema. Bilateral pleural effusions are present, right greater than left.  IMPRESSION:  1.  Stable support apparatus. 2.  Slight increase in bibasilar atelectasis and small effusions.   Original Report Authenticated By: Rudie Meyer, M.D.    Medications: Infusions:    . dextrose 5 % and 0.9% NaCl 1,000 mL  infusion 20 mL/hr at 10/28/12 1900  . fentaNYL infusion INTRAVENOUS 300 mcg/hr (10/30/12 0700)  . midazolam (VERSED) infusion 4 mg/hr (10/30/12 0700)  . [DISCONTINUED] norepinephrine (LEVOPHED) Adult infusion Stopped (10/29/12 0600)    Scheduled Medications:    . antiseptic oral rinse  15 mL Mouth Rinse q12n4p  . chlorhexidine  15 mL Mouth/Throat BID  . Chlorhexidine Gluconate Cloth  6 each Topical Daily  . imipenem-cilastatin  500 mg Intravenous Q6H  . levothyroxine  26 mcg Intravenous Daily  . mupirocin ointment  1 application Nasal BID  . pantoprazole (PROTONIX) IV  40 mg Intravenous Q24H  . vancomycin  1,000 mg Intravenous Q12H  . [DISCONTINUED] imipenem-cilastatin  500 mg Intravenous Q8H  . [DISCONTINUED] vancomycin  1,500 mg Intravenous Q24H    have reviewed scheduled  and prn medications.  Physical Exam: General: sedated but opening eyes to stimuli Heart: tachy Lungs: CBS bilaterally Abdomen:distended, vac in place Extremities: improved dependant pitting edema    Assessment/ Plan: Pt is a 46 y.o. yo male who was admitted on 10/26/2012 with  Complicated abdominal surgery now with AKI  Assessment/Plan: 1. Renal- AKI after complicated abdominal surgery- likely multifactorial due to ATN/hypotension with previous ACE and ARB, also element of analgesic nephropathy (mobic/toradol) and increased intraabdominal pressure.  Fortunately seems to have turned the corner as far as kidney function. Good UOP and creatinine is down to near normal.   2. Volume- receiving IVF per CCM, CVP recorded at 12-13, hypotensive.  OK to give IVF to keep CVP up 3. Anemia- drop not surprising given 3 surgeries, per surgery and CCM  Doing well from kidney standpoint.  Renal will sign off     Douglas Wiggins A   10/30/2012,7:49 AM  LOS: 4 days

## 2012-10-30 NOTE — Progress Notes (Addendum)
1 Day Post-Op  Subjective: No major events. On vent  Objective: Vital signs in last 24 hours: Temp:  [98 F (36.7 C)-99.8 F (37.7 C)] 98 F (36.7 C) (11/29 0801) Pulse Rate:  [92-103] 94  (11/29 0820) Resp:  [11-26] 15  (11/29 0820) BP: (104)/(49) 104/49 mmHg (11/29 0820) SpO2:  [95 %-100 %] 98 % (11/29 0820) Arterial Line BP: (96-144)/(46-66) 106/49 mmHg (11/29 0700) FiO2 (%):  [39.4 %-40.4 %] 40 % (11/29 0820) Weight:  [258 lb 13.1 oz (117.4 kg)] 258 lb 13.1 oz (117.4 kg) (11/29 0600) Last BM Date:  (preop)  Intake/Output from previous day: 11/28 0701 - 11/29 0700 In: 2658 [I.V.:1808; Blood:350; IV Piggyback:500] Out: 3165 [Urine:3035; Drains:130] Intake/Output this shift:    Intubated, sedated cta ant with coarse bs at bases Reg Soft, nd, wound vac in place. hypoBS Scds, no edema  Lab Results:   Basename 10/29/12 1300 10/29/12 1030 10/29/12 0315  WBC 7.2 -- 7.5  HGB 8.9* 6.1* --  HCT 26.4* 18.0* --  PLT 151 -- 147*   BMET  Basename 10/30/12 0430 10/29/12 1325  NA 140 136  K 4.1 4.2  CL 107 105  CO2 24 23  GLUCOSE 103* 101*  BUN 23 28*  CREATININE 1.36* 1.58*  CALCIUM 8.5 8.3*   PT/INR No results found for this basename: LABPROT:2,INR:2 in the last 72 hours ABG  Basename 10/29/12 1030 10/29/12 0547  PHART 7.337* 7.326*  HCO3 20.2 22.3    Studies/Results: Dg Chest Port 1 View  10/30/2012  *RADIOLOGY REPORT*  Clinical Data: Evaluate endotracheal tube  PORTABLE CHEST - 1 VIEW  Comparison: 10/29/2012; 10/28/2012; 10/27/2012  Findings:  Grossly unchanged cardiac silhouette and mediastinal contours. Stable positioning of support apparatus.  The pulmonary vasculature remains indistinct.  Small layering bilateral effusions are unchanged.  Grossly unchanged perihilar and basilar opacities.  No new focal airspace opacities.  No pneumothorax.  Unchanged bones.  IMPRESSION: 1.  Stable positioning of support apparatus.  No pneumothorax. 2.  Grossly unchanged  findings of pulmonary edema, small layering bilateral effusions and perihilar/basilar opacities, likely atelectasis.   Original Report Authenticated By: Tacey Ruiz, MD    Dg Chest Port 1 View  10/29/2012  *RADIOLOGY REPORT*  Clinical Data: Respiratory failure.  PORTABLE CHEST - 1 VIEW  Comparison: 10/28/2012.  Findings: The support apparatus is stable.  Lower lung volumes with increasing vascular crowding and bibasilar atelectasis.  Mild central vascular congestion without overt pulmonary edema. Bilateral pleural effusions are present, right greater than left.  IMPRESSION:  1.  Stable support apparatus. 2.  Slight increase in bibasilar atelectasis and small effusions.   Original Report Authenticated By: Rudie Meyer, M.D.     Anti-infectives: Anti-infectives     Start     Dose/Rate Route Frequency Ordered Stop   10/29/12 2030   imipenem-cilastatin (PRIMAXIN) 500 mg in sodium chloride 0.9 % 100 mL IVPB        500 mg 200 mL/hr over 30 Minutes Intravenous 4 times per day 10/29/12 2024     10/29/12 1000   vancomycin (VANCOCIN) IVPB 1000 mg/200 mL premix        1,000 mg 200 mL/hr over 60 Minutes Intravenous Every 12 hours 10/29/12 0915     10/28/12 2200   imipenem-cilastatin (PRIMAXIN) 500 mg in sodium chloride 0.9 % 100 mL IVPB  Status:  Discontinued        500 mg 200 mL/hr over 30 Minutes Intravenous 3 times per day 10/28/12 1217 10/29/12 2024  10/28/12 1930   vancomycin (VANCOCIN) 1,500 mg in sodium chloride 0.9 % 500 mL IVPB  Status:  Discontinued        1,500 mg 250 mL/hr over 120 Minutes Intravenous Every 24 hours 10/27/12 1758 10/29/12 0915   10/27/12 1930   vancomycin (VANCOCIN) 2,000 mg in sodium chloride 0.9 % 500 mL IVPB        2,000 mg 250 mL/hr over 120 Minutes Intravenous  Once 10/27/12 1757 10/27/12 2124   10/27/12 1830   imipenem-cilastatin (PRIMAXIN) 250 mg in sodium chloride 0.9 % 100 mL IVPB        250 mg 200 mL/hr over 30 Minutes Intravenous 4 times per day 10/27/12  1758 10/28/12 1247   10/27/12 1330   ceFAZolin (ANCEF) IVPB 2 g/50 mL premix        2 g 100 mL/hr over 30 Minutes Intravenous  Once 10/27/12 1300 10/27/12 1501   10/26/12 1600   cefOXitin (MEFOXIN) 1 g in dextrose 5 % 50 mL IVPB        1 g 100 mL/hr over 30 Minutes Intravenous Every 6 hours 10/26/12 1517 10/27/12 0417   10/26/12 0600   ceFAZolin (ANCEF) 3 g in dextrose 5 % 50 mL IVPB        3 g 160 mL/hr over 30 Minutes Intravenous On call to O.R. 10/25/12 1337 10/26/12 0915          Assessment/Plan: s/p Procedure(s) (LRB) with comments: EXPLORATORY LAPAROTOMY (N/A) - exploratory laparotomy, partial closure of abdominal wound,   APPLICATION OF WOUND VAC () EXPLORATORY LAPAROTOMY (N/A)  s/p revision of colon anastomosis; primary repair of ventral hernia; post-op hemoperitoneum with abdominal compartment syndrome  Wean vent per CCM - (no additional surgeries planned - fascia is closed) HD stable; will check cbc now. AKI - Cr improving. uop ok ID - will stop abx Endo - will increase synthroid per pharm F/E/N - npo; increase mIVF from 20cc/hr to 50cc/hr VTE prophylaxis - scds, lovenox  Douglas Wiggins. Douglas Campanile, MD, FACS General, Bariatric, & Minimally Invasive Surgery [facility] [place]    LOS: 4 days    Douglas Wiggins 10/30/2012

## 2012-10-30 NOTE — Progress Notes (Signed)
10ml Versed and 50ml Fentanyl wasted by pouring down sink and flushed.  Witnessed by Francetta Found, RN and Acey Lav, RN.

## 2012-10-30 NOTE — Progress Notes (Signed)
UR Completed.  Abbygayle Helfand Jane [phone] 10/30/2012  

## 2012-10-31 LAB — TYPE AND SCREEN
Unit division: 0
Unit division: 0

## 2012-10-31 LAB — RENAL FUNCTION PANEL
Albumin: 2.5 g/dL — ABNORMAL LOW (ref 3.5–5.2)
Calcium: 8.9 mg/dL (ref 8.4–10.5)
GFR calc Af Amer: >90 mL/min (ref >90)
Phosphorus: 2.4 mg/dL (ref 2.3–4.6)
Potassium: 3.3 mEq/L — ABNORMAL LOW (ref 3.5–5.1)
Sodium: 145 mEq/L (ref 135–145)

## 2012-10-31 LAB — CBC
MCHC: 35.3 g/dL (ref 30.0–36.0)
MCV: 84.6 fL (ref 78.0–100.0)
Platelets: 165 10*3/uL (ref 150–400)
RDW: 13.3 % (ref 11.5–15.5)
WBC: 6.1 10*3/uL (ref 4.0–10.5)

## 2012-10-31 MED ORDER — DEXMEDETOMIDINE HCL IN NACL 200 MCG/50ML IV SOLN
0.2000 ug/kg/h | INTRAVENOUS | Status: AC
  Administered 2012-10-31 (×5): 0.7 ug/kg/h via INTRAVENOUS
  Administered 2012-10-31: 0.2 ug/kg/h via INTRAVENOUS
  Administered 2012-10-31: 0.7 ug/kg/h via INTRAVENOUS
  Filled 2012-10-31 (×11): qty 50

## 2012-10-31 MED ORDER — HEPARIN SODIUM (PORCINE) 5000 UNIT/ML IJ SOLN
5000.0000 [IU] | Freq: Three times a day (TID) | INTRAMUSCULAR | Status: DC
  Administered 2012-10-31 – 2012-11-03 (×9): 5000 [IU] via SUBCUTANEOUS
  Filled 2012-10-31 (×12): qty 1

## 2012-10-31 NOTE — Progress Notes (Addendum)
2 Days Post-Op  Subjective: Self extubated; reportedly confused & agitated overnight and this am. Started on precedex by CCM. Pt states he is thirsty  Objective: Vital signs in last 24 hours: Temp:  [97.7 F (36.5 C)-99.1 F (37.3 C)] 97.7 F (36.5 C) (11/30 1146) Pulse Rate:  [73-100] 79  (11/30 1200) Resp:  [19-32] 21  (11/30 1200) BP: (101-164)/(67-94) 131/84 mmHg (11/30 1200) SpO2:  [91 %-100 %] 100 % (11/30 1200) Arterial Line BP: (126-178)/(64-99) 141/85 mmHg (11/30 1200) FiO2 (%):  [35 %-40 %] 35 % (11/30 0600) Weight:  [258 lb 6.1 oz (117.2 kg)] 258 lb 6.1 oz (117.2 kg) (11/30 0400) Last BM Date:  (preop)  Intake/Output from previous day: 11/29 0701 - 11/30 0700 In: 1275.9 [I.V.:1175.9; NG/GT:90; IV Piggyback:10] Out: 4400 [Urine:4100; Emesis/NG output:200; Drains:100] Intake/Output this shift: Total I/O In: 2177.2 [I.V.:2147.2; NG/GT:30] Out: 450 [Urine:450]  A little drowsy, restrained. ox3 cta with decreased bs at bases Reg Soft, hypoBS, wound vac in place No edema  Lab Results:   Basename 10/31/12 0415 10/30/12 0900  WBC 6.1 7.4  HGB 8.5* 8.7*  HCT 24.1* 25.7*  PLT 165 179   BMET  Basename 10/31/12 0415 10/30/12 0430  NA 145 140  K 3.3* 4.1  CL 109 107  CO2 26 24  GLUCOSE 115* 103*  BUN 16 23  CREATININE 0.98 1.36*  CALCIUM 8.9 8.5   PT/INR No results found for this basename: LABPROT:2,INR:2 in the last 72 hours ABG  Basename 10/29/12 1030 10/29/12 0547  PHART 7.337* 7.326*  HCO3 20.2 22.3    Studies/Results: Dg Chest Port 1 View  10/30/2012  *RADIOLOGY REPORT*  Clinical Data: Evaluate endotracheal tube  PORTABLE CHEST - 1 VIEW  Comparison: 10/29/2012; 10/28/2012; 10/27/2012  Findings:  Grossly unchanged cardiac silhouette and mediastinal contours. Stable positioning of support apparatus.  The pulmonary vasculature remains indistinct.  Small layering bilateral effusions are unchanged.  Grossly unchanged perihilar and basilar opacities.  No  new focal airspace opacities.  No pneumothorax.  Unchanged bones.  IMPRESSION: 1.  Stable positioning of support apparatus.  No pneumothorax. 2.  Grossly unchanged findings of pulmonary edema, small layering bilateral effusions and perihilar/basilar opacities, likely atelectasis.   Original Report Authenticated By: Tacey Ruiz, MD     Anti-infectives: Anti-infectives     Start     Dose/Rate Route Frequency Ordered Stop   10/29/12 2030   imipenem-cilastatin (PRIMAXIN) 500 mg in sodium chloride 0.9 % 100 mL IVPB  Status:  Discontinued        500 mg 200 mL/hr over 30 Minutes Intravenous 4 times per day 10/29/12 2024 10/30/12 0841   10/29/12 1000   vancomycin (VANCOCIN) IVPB 1000 mg/200 mL premix  Status:  Discontinued        1,000 mg 200 mL/hr over 60 Minutes Intravenous Every 12 hours 10/29/12 0915 10/30/12 0841   10/28/12 2200   imipenem-cilastatin (PRIMAXIN) 500 mg in sodium chloride 0.9 % 100 mL IVPB  Status:  Discontinued        500 mg 200 mL/hr over 30 Minutes Intravenous 3 times per day 10/28/12 1217 10/29/12 2024   10/28/12 1930   vancomycin (VANCOCIN) 1,500 mg in sodium chloride 0.9 % 500 mL IVPB  Status:  Discontinued        1,500 mg 250 mL/hr over 120 Minutes Intravenous Every 24 hours 10/27/12 1758 10/29/12 0915   10/27/12 1930   vancomycin (VANCOCIN) 2,000 mg in sodium chloride 0.9 % 500 mL IVPB  2,000 mg 250 mL/hr over 120 Minutes Intravenous  Once 10/27/12 1757 10/27/12 2124   10/27/12 1830   imipenem-cilastatin (PRIMAXIN) 250 mg in sodium chloride 0.9 % 100 mL IVPB        250 mg 200 mL/hr over 30 Minutes Intravenous 4 times per day 10/27/12 1758 10/28/12 1247   10/27/12 1330   ceFAZolin (ANCEF) IVPB 2 g/50 mL premix        2 g 100 mL/hr over 30 Minutes Intravenous  Once 10/27/12 1300 10/27/12 1501   10/26/12 1600   cefOXitin (MEFOXIN) 1 g in dextrose 5 % 50 mL IVPB        1 g 100 mL/hr over 30 Minutes Intravenous Every 6 hours 10/26/12 1517 10/27/12 0417    10/26/12 0600   ceFAZolin (ANCEF) 3 g in dextrose 5 % 50 mL IVPB        3 g 160 mL/hr over 30 Minutes Intravenous On call to O.R. 10/25/12 1337 10/26/12 0915          Assessment/Plan: s/p Procedure(s) (LRB) with comments: EXPLORATORY LAPAROTOMY (N/A) - exploratory laparotomy, partial closure of abdominal wound,   APPLICATION OF WOUND VAC ()  D/c ng tube, aline Pt may have ice chips & sips of water ARF - Cr normal. Great uop. Keep foley secondary to agitation.  Agitation - ?etiology. Vitals ok. Labs ok. Cont precedex for now Anemia - hgb stable x 2 days VTE prophy - scds. Will restart subcu heparin - monitor cbc  Douglas Wiggins. Douglas Campanile, MD, FACS General, Bariatric, & Minimally Invasive Wiggins Douglas Wiggins, Douglas Wiggins   LOS: 5 days    Douglas Wiggins 10/31/2012

## 2012-10-31 NOTE — Progress Notes (Signed)
PULMONARY  / CRITICAL CARE MEDICINE  Name: Douglas Wiggins MRN: [id number] DOB: 1966/09/07    LOS: 5  REFERRING MD:  [name])  CHIEF COMPLAINT:  Post op respiratory failure  BRIEF PATIENT DESCRIPTION: 46 yo admitted 11/24 for exploratory laparotomy, partial colectomy with revision of colon anastomosis and primary repair of ventral hernia on 11/25.  Course was complicated by hemoperitoneum and abdominal compartment syndrome with repeated laparotomy on 11/26.  Brought to ICU hypoxic, mechanically ventilated.  LINES / TUBES: ETT 11/26 >>  L [place] CVL 11/26 >>> R rad A line 11/26 >>> Foley 11/26 >>>  CULTURES: 11/26  Urine >>> ntd  ANTIBIOTICS: Primaxin 11/26 >>> 11/29 Vancomycin 11/26 >>> 11/29  SIGNIFICANT EVENTS:  11/24  Admitted for elective surgery 11/25  Exploratory laparotomy, partial colectomy with revision of colon anastomosis and primary repair of ventral hernia 11/26  Repeated laparotomy for abdominal compartment syndrome, brought to ICU hypoxic, mechanically ventilated 11/28  Fascia is closed, abx are d/c'd, weaning started 11/29 getting agitated  LEVEL OF CARE:  ICU  PRIMARY SERVICE:  CCS  CONSULTANTS:  PCCM, Renal  CODE STATUS:  Full  DIET:  NPO  DVT Px:  SCDs  GI Px:  Protonix  INTERVAL HISTORY:    11/29 - pulled out central line and self extubated 11/30 - agitation RASS +1 to +3 continues. Needing restraints. Confused. Afebrile. RN does not think he is in pain. He gives remote hx of etoh. Currently CAGE - negative and drinks "half beer a week"  VITAL SIGNS: Temp:  [98.2 F (36.8 C)-99.1 F (37.3 C)] 98.2 F (36.8 C) (11/30 0746) Pulse Rate:  [82-101] 87  (11/30 0700) Resp:  [17-32] 27  (11/30 0700) BP: (101-164)/(67-92) 145/87 mmHg (11/30 0700) SpO2:  [91 %-100 %] 98 % (11/30 0700) Arterial Line BP: (116-178)/(54-99) 166/93 mmHg (11/30 0700) FiO2 (%):  [35 %-50 %] 35 % (11/30 0600) Weight:  [117.2 kg (258 lb 6.1 oz)] 117.2 kg (258 lb 6.1 oz)  (11/30 0400)  HEMODYNAMICS: CVP:  [6 mmHg] 6 mmHg  VENTILATOR SETTINGS: Vent Mode:  [-]  FiO2 (%):  [35 %-50 %] 35 %  INTAKE / OUTPUT: Intake/Output      11/29 0701 - 11/30 0700 11/30 0701 - 12/01 0700   I.V. (mL/kg) 1175.9 (10)    Blood     NG/GT 90    IV Piggyback 10    Total Intake(mL/kg) 1275.9 (10.9)    Urine (mL/kg/hr) 4100 (1.5)    Emesis/NG output 200    Drains 100    Total Output 4400    Net -3124.1          PHYSICAL EXAMINATION: General:  Unwell looking male PULM: Bilateral air entry CV: Tachy, regular, loud systolic murmur AB: Diminished bowel sounds Ext: Aarm, trace edema Neuro: Awake, RASS +1 to +3. Confused. Moves all 4s LABS:  Lab 10/31/12 0415 10/30/12 0900 10/30/12 0430 10/29/12 1325 10/29/12 1300 10/29/12 1030 10/29/12 0547 10/29/12 0315 10/28/12 0935 10/28/12 0540 10/28/12 0410 10/28/12 0335 10/27/12 2141 10/27/12 2135  HGB 8.5* 8.7* -- -- 8.9* -- -- -- -- -- -- -- -- --  WBC 6.1 7.4 -- -- 7.2 -- -- -- -- -- -- -- -- --  PLT 165 179 -- -- 151 -- -- -- -- -- -- -- -- --  NA 145 -- 140 136 -- -- -- -- -- -- -- -- -- --  K 3.3* -- 4.1 -- -- -- -- -- -- -- -- -- -- --  CL 109 -- 107 105 -- -- -- -- -- -- -- -- -- --  CO2 26 -- 24 23 -- -- -- -- -- -- -- -- -- --  GLUCOSE 115* -- 103* 101* -- -- -- -- -- -- -- -- -- --  BUN 16 -- 23 28* -- -- -- -- -- -- -- -- -- --  CREATININE 0.98 -- 1.36* 1.58* -- -- -- -- -- -- -- -- -- --  CALCIUM 8.9 -- 8.5 8.3* -- -- -- -- -- -- -- -- -- --  MG -- -- -- -- -- -- -- -- -- -- -- -- -- --  PHOS 2.4 -- 2.9 -- -- -- -- 2.6 -- -- -- -- -- --  AST -- -- -- -- -- -- -- -- -- -- 608* -- -- --  ALT -- -- -- -- -- -- -- -- -- -- 819* -- -- --  ALKPHOS -- -- -- -- -- -- -- -- -- -- 54 -- -- --  BILITOT -- -- -- -- -- -- -- -- -- -- 0.4 -- -- --  PROT -- -- -- -- -- -- -- -- -- -- 5.3* -- -- --  ALBUMIN 2.5* -- 2.3* -- -- -- -- 2.3* -- -- -- -- -- --  APTT -- -- -- -- -- -- -- -- -- -- -- -- -- --  INR -- -- -- -- -- --  -- -- -- -- -- -- -- --  LATICACIDVEN -- -- -- -- -- -- -- -- 1.2 -- -- 1.7 -- 1.3  TROPONINI -- -- -- -- -- -- -- -- -- -- -- -- -- --  PROCALCITON -- -- -- -- -- -- -- 0.68 -- -- 2.35 -- 2.68 --  PROBNP -- -- -- -- -- -- -- -- -- -- -- -- -- --  O2SATVEN -- -- -- -- -- -- -- -- -- -- -- -- -- --  PHART -- -- -- -- -- 7.337* 7.326* -- -- 7.360 -- -- -- --  PCO2ART -- -- -- -- -- 37.7 43.2 -- -- 38.3 -- -- -- --  PO2ART -- -- -- -- -- 173.0* 81.0 -- -- 115.0* -- -- -- --   No results found for this basename: GLUCAP:5 in the last 168 hours  IMAGING:  Dg Chest Port 1 View  10/30/2012  *RADIOLOGY REPORT*  Clinical Data: Evaluate endotracheal tube  PORTABLE CHEST - 1 VIEW  Comparison: 10/29/2012; 10/28/2012; 10/27/2012  Findings:  Grossly unchanged cardiac silhouette and mediastinal contours. Stable positioning of support apparatus.  The pulmonary vasculature remains indistinct.  Small layering bilateral effusions are unchanged.  Grossly unchanged perihilar and basilar opacities.  No new focal airspace opacities.  No pneumothorax.  Unchanged bones.  IMPRESSION: 1.  Stable positioning of support apparatus.  No pneumothorax. 2.  Grossly unchanged findings of pulmonary edema, small layering bilateral effusions and perihilar/basilar opacities, likely atelectasis.   Original Report Authenticated By: Tacey Ruiz, MD    ECG:  No new  DIAGNOSES: Principal Problem:  *Status post laparotomy, evac of hemoperitoneum and wound vac placement 11/26 Active Problems:  H/O Hypertension  Hypothyroid  Encephalopathy acute  Abdominal compartment syndrome, post lap 11/26  Shock due to abdominal compartment syndrome and acute blood loss  Hemoperitoneum, resolved post lap 11/26  Respiratory failure, post-operative  Acute renal insufficiency, nonoliguric   Ex lap with partial colectomy with revision of colon anastomosis, repair of ventral hernia 11/25  ASSESSMENT /  PLAN:  PULMONARY  A:  Post op respiratory  failure. 11/29 - selft extubated 11/30 - maintaing airway but agitated P:   Goal SPO2 > 92 Albuterol PRN  CARDIOVASCULAR  A: Shock, resolved. 10/31/12 - normal BP/hr P:  Holding antihypertensives    RENAL  A:  AKI, likely secondary to compartment syndrome, improving.  Renal signed off. 10/31/12: normal creat  P:   Goal CVP>10 Trend BMP  GASTROINTESTINAL  A:  S/p laparotomy x 2.  Abdominal compartment syndrome.  Hemoperitoneum.  Fascia closed 11/29. 10/31/12: no acute issues P:   Post op mgmt per CCS  HEMATOLOGIC  A:  Acute blood loss anemia, stable. P:  Trend CBC PRBC for hgb < 7gm% only  INFECTIOUS  A:  No evidence of acute infection. 10/31/12 - afebrile P:   Antibiotics d/c'd by CCS ? date  ENDOCRINE   A:  Hypothyroidism. P:   Synthroid IV  NEUROLOGIC  A:  Acute encephalopathy.  Post surgical pain. 10/31/12: Delirium + (agitated).? Cause  P:   STart precedex 10/31/12 t Fentanyl PRN pain  CLINICAL SUMMARY:  46 yo admitted 11/24 for exploratory laparotomy, partial colectomy with revision of colon anastomosis and primary repair of ventral hernia on 11/25.  Course was complicated by hemoperitoneum and abdominal compartment syndrome with repeated laparotomy on 11/26.  Brought to ICU hypoxic, mechanically ventilated.  Fascia closed 11/28.  Extubated 10/30/12. Agitated 10/31/12- Rx precedex  CRITICAL CARE:  The patient is critically ill with multiple organ systems failure and requires high complexity decision making for assessment and support, frequent evaluation and titration of therapies, application of advanced monitoring technologies and extensive interpretation of multiple databases. Critical Care Time devoted to patient care services described in this note is 35 minutes.     Dr. Kalman Shan, M.D., [facility].C.P Pulmonary and Critical Care Medicine Staff Physician [facility] Pager: [phone], If no  answer or between  15:00h - 7:00h: call [phone]  10/31/2012 8:53 AM

## 2012-10-31 NOTE — Progress Notes (Deleted)
eLink Physician-Brief Progress Note Patient Name: Douglas Wiggins DOB: 1966/05/14 MRN: [id number]  Date of Service  10/31/2012   HPI/Events of Note   Hypotension. CT reviewed - retroperitoneal hemorrhage.  CVP 5. Last Hct 10.   eICU Interventions   CBC, type&cross, APTT, INR now.  NS 1000 mL bolus x 1.  Heparin / Coumadin d/c'd.  SCDs placed.    Intervention Category Major Interventions: Hypotension - evaluation and management  Trinia Georgi 10/31/2012, 4:00 PM

## 2012-11-01 LAB — BASIC METABOLIC PANEL
Calcium: 9 mg/dL (ref 8.4–10.5)
GFR calc non Af Amer: 90 mL/min — ABNORMAL LOW (ref >90)
Glucose, Bld: 120 mg/dL — ABNORMAL HIGH (ref 70–99)
Potassium: 3.3 mEq/L — ABNORMAL LOW (ref 3.5–5.1)
Sodium: 141 mEq/L (ref 135–145)

## 2012-11-01 LAB — CBC
Hemoglobin: 8.5 g/dL — ABNORMAL LOW (ref 13.0–17.0)
MCH: 29.4 pg (ref 26.0–34.0)
MCHC: 35.1 g/dL (ref 30.0–36.0)
Platelets: 219 10*3/uL (ref 150–400)
RBC: 2.89 MIL/uL — ABNORMAL LOW (ref 4.22–5.81)

## 2012-11-01 MED ORDER — LORAZEPAM 2 MG/ML IJ SOLN
INTRAMUSCULAR | Status: AC
  Filled 2012-11-01: qty 1

## 2012-11-01 MED ORDER — POTASSIUM CHLORIDE 10 MEQ/100ML IV SOLN
10.0000 meq | INTRAVENOUS | Status: AC
  Administered 2012-11-01 (×4): 10 meq via INTRAVENOUS
  Filled 2012-11-01: qty 400

## 2012-11-01 MED ORDER — LORAZEPAM 2 MG/ML IJ SOLN
0.5000 mg | Freq: Once | INTRAMUSCULAR | Status: AC
  Administered 2012-11-01: 0.5 mg via INTRAVENOUS

## 2012-11-01 MED ORDER — POTASSIUM CHLORIDE 10 MEQ/50ML IV SOLN: 10.0000 meq | INTRAVENOUS | Status: DC

## 2012-11-01 NOTE — Progress Notes (Signed)
3 Days Post-Op  Subjective: Feels much better.  Wants to eat.  Having BM  Objective: Vital signs in last 24 hours: Temp:  [97.5 F (36.4 C)-98.4 F (36.9 C)] 97.5 F (36.4 C) (12/01 0718) Pulse Rate:  [49-89] 64  (12/01 0800) Resp:  [10-24] 24  (12/01 0800) BP: (112-157)/(64-100) 122/80 mmHg (12/01 0800) SpO2:  [94 %-100 %] 97 % (12/01 0800) Arterial Line BP: (137-145)/(81-87) 137/81 mmHg (11/30 1219) Weight:  [257 lb 11.5 oz (116.9 kg)] 257 lb 11.5 oz (116.9 kg) (12/01 0600) Last BM Date:  (preop)  Intake/Output from previous day: 11/30 0701 - 12/01 0700 In: 3601.7 [P.O.:75; I.V.:3486.7; NG/GT:30; IV Piggyback:10] Out: 1850 [Urine:1700; Drains:150] Intake/Output this shift: Total I/O In: 120.5 [P.O.:50; I.V.:70.5] Out: -   Incision/Wound:VAC IN PLACE SOFT ND AWAKE ALERT AND APPROPRIATE  Lab Results:   Basename 11/01/12 0500 10/31/12 0415  WBC 6.9 6.1  HGB 8.5* 8.5*  HCT 24.2* 24.1*  PLT 219 165   BMET  Basename 11/01/12 0500 10/31/12 0415  NA 141 145  K 3.3* 3.3*  CL 105 109  CO2 26 26  GLUCOSE 120* 115*  BUN 21 16  CREATININE 1.00 0.98  CALCIUM 9.0 8.9   PT/INR No results found for this basename: LABPROT:2,INR:2 in the last 72 hours ABG  Basename 10/29/12 1030  PHART 7.337*  HCO3 20.2    Studies/Results: No results found.  Anti-infectives: Anti-infectives     Start     Dose/Rate Route Frequency Ordered Stop   10/29/12 2030   imipenem-cilastatin (PRIMAXIN) 500 mg in sodium chloride 0.9 % 100 mL IVPB  Status:  Discontinued        500 mg 200 mL/hr over 30 Minutes Intravenous 4 times per day 10/29/12 2024 10/30/12 0841   10/29/12 1000   vancomycin (VANCOCIN) IVPB 1000 mg/200 mL premix  Status:  Discontinued        1,000 mg 200 mL/hr over 60 Minutes Intravenous Every 12 hours 10/29/12 0915 10/30/12 0841   10/28/12 2200   imipenem-cilastatin (PRIMAXIN) 500 mg in sodium chloride 0.9 % 100 mL IVPB  Status:  Discontinued        500 mg 200 mL/hr  over 30 Minutes Intravenous 3 times per day 10/28/12 1217 10/29/12 2024   10/28/12 1930   vancomycin (VANCOCIN) 1,500 mg in sodium chloride 0.9 % 500 mL IVPB  Status:  Discontinued        1,500 mg 250 mL/hr over 120 Minutes Intravenous Every 24 hours 10/27/12 1758 10/29/12 0915   10/27/12 1930   vancomycin (VANCOCIN) 2,000 mg in sodium chloride 0.9 % 500 mL IVPB        2,000 mg 250 mL/hr over 120 Minutes Intravenous  Once 10/27/12 1757 10/27/12 2124   10/27/12 1830   imipenem-cilastatin (PRIMAXIN) 250 mg in sodium chloride 0.9 % 100 mL IVPB        250 mg 200 mL/hr over 30 Minutes Intravenous 4 times per day 10/27/12 1758 10/28/12 1247   10/27/12 1330   ceFAZolin (ANCEF) IVPB 2 g/50 mL premix        2 g 100 mL/hr over 30 Minutes Intravenous  Once 10/27/12 1300 10/27/12 1501   10/26/12 1600   cefOXitin (MEFOXIN) 1 g in dextrose 5 % 50 mL IVPB        1 g 100 mL/hr over 30 Minutes Intravenous Every 6 hours 10/26/12 1517 10/27/12 0417   10/26/12 0600   ceFAZolin (ANCEF) 3 g in dextrose 5 % 50 mL  IVPB        3 g 160 mL/hr over 30 Minutes Intravenous On call to O.R. 10/25/12 1337 10/26/12 0915          Assessment/Plan: s/p Procedure(s) (LRB) with comments: EXPLORATORY LAPAROTOMY (N/A) - exploratory laparotomy, partial closure of abdominal wound,   APPLICATION OF WOUND VAC () Keep in ICU for today but looks much better. Monitor delerium. Try full liquids Cont wound vac Floor on Monday   LOS: 6 days    Esaias Cleavenger A. 11/01/2012

## 2012-11-01 NOTE — Progress Notes (Signed)
eLink Physician-Brief Progress Note Patient Name: Douglas Wiggins DOB: August 26, 1966 MRN: [id number]  Date of Service  11/01/2012   HPI/Events of Note  Call from nurse reporting that patient is anxious and agitated.  Requesting anxiolytic.  Had taken xanax in the past. Patient is not delirious.   eICU Interventions  Plan: 1 time dose of ativan 0.5 mg IV   Intervention Category Minor Interventions: Agitation / anxiety - evaluation and management  DETERDING,ELIZABETH 11/01/2012, 11:44 PM

## 2012-11-01 NOTE — Progress Notes (Signed)
E-MD called and discussed pt restlessness, pt also request medication for restlessness.  Orders received and appreciated.

## 2012-11-01 NOTE — Progress Notes (Addendum)
PULMONARY  / CRITICAL CARE MEDICINE  Name: Douglas Wiggins MRN: [id number] DOB: Mar 14, 1966    LOS: 6  REFERRING MD:  [name])  CHIEF COMPLAINT:  Post op respiratory failure  BRIEF PATIENT DESCRIPTION: 46 yo admitted 11/24 for exploratory laparotomy, partial colectomy with revision of colon anastomosis and primary repair of ventral hernia on 11/25.  Course was complicated by hemoperitoneum and abdominal compartment syndrome with repeated laparotomy on 11/26.  Brought to ICU hypoxic, mechanically ventilated.  LINES / TUBES: ETT 11/26 >>  L [place] CVL 11/26 >>> R rad A line 11/26 >>> Foley 11/26 >>>  CULTURES: 11/26  Urine >>> ntd  ANTIBIOTICS: Primaxin 11/26 >>> 11/29 Vancomycin 11/26 >>> 11/29  LEVEL OF CARE:  ICU  PRIMARY SERVICE:  CCS  CONSULTANTS:  PCCM, Renal  CODE STATUS:  Full  DIET:  NPO  DVT Px:  SCDs  GI Px:  Protonix   SIGNIFICANT EVENTS:  11/24  Admitted for elective surgery 11/25  Exploratory laparotomy, partial colectomy with revision of colon anastomosis and primary repair of ventral hernia 11/26  Repeated laparotomy for abdominal compartment syndrome, brought to ICU hypoxic, mechanically ventilated 11/28  Fascia is closed, abx are d/c'd, weaning started 11/29 getting agitated - pulled out central line and self extubated 11/30 - agitation RASS +1 to +3 continues. Needing restraints. Confused. Afebrile. RN does not think he is in pain. He gives remote hx of etoh. Currently CAGE - negative but drinks "half beer a week". Rx precedex   SUBJECTIVE/OVERNIGHT/INTERVAL HX 11/01/12: Delirium resolved while on precedex. Continues to be on precedex and is transferrring to chair from bed. Denies complaints    VITAL SIGNS: Temp:  [97.5 F (36.4 C)-98.4 F (36.9 C)] 97.5 F (36.4 C) (12/01 0718) Pulse Rate:  [49-89] 64  (12/01 0800) Resp:  [10-24] 24  (12/01 0800) BP: (112-157)/(64-100) 122/80 mmHg (12/01 0800) SpO2:  [94 %-100 %] 97 % (12/01  0800) Arterial Line BP: (137-154)/(81-90) 137/81 mmHg (11/30 1219) Weight:  [116.9 kg (257 lb 11.5 oz)] 116.9 kg (257 lb 11.5 oz) (12/01 0600)  INTAKE / OUTPUT: Intake/Output      11/30 0701 - 12/01 0700 12/01 0701 - 12/02 0700   P.O. 75 50   I.V. (mL/kg) 3486.7 (29.8) 70.5 (0.6)   NG/GT 30    IV Piggyback 10    Total Intake(mL/kg) 3601.7 (30.8) 120.5 (1)   Urine (mL/kg/hr) 1700 (0.6)    Emesis/NG output     Drains 150    Total Output 1850    Net +1751.7 +120.5         PHYSICAL EXAMINATION: General:  Unwell looking male but looking increasingly better as of 11/01/2012 PULM: Bilateral air entry CV: Tachy, regular, loud systolic murmur AB: Diminished bowel sounds Ext: Aarm, trace edema Neuro: Awake, RASS +1. CAM-ICU neg for delirium. GCS 15.  Moves all 4s LABS:  Lab 11/01/12 0500 10/31/12 0415 10/30/12 0900 10/30/12 0430 10/29/12 1030 10/29/12 0547 10/29/12 0315 10/28/12 0935 10/28/12 0540 10/28/12 0410 10/28/12 0335 10/27/12 2141 10/27/12 2135  HGB 8.5* 8.5* 8.7* -- -- -- -- -- -- -- -- -- --  WBC 6.9 6.1 7.4 -- -- -- -- -- -- -- -- -- --  PLT 219 165 179 -- -- -- -- -- -- -- -- -- --  NA 141 145 -- 140 -- -- -- -- -- -- -- -- --  K 3.3* 3.3* -- -- -- -- -- -- -- -- -- -- --  CL 105 109 --  107 -- -- -- -- -- -- -- -- --  CO2 26 26 -- 24 -- -- -- -- -- -- -- -- --  GLUCOSE 120* 115* -- 103* -- -- -- -- -- -- -- -- --  BUN 21 16 -- 23 -- -- -- -- -- -- -- -- --  CREATININE 1.00 0.98 -- 1.36* -- -- -- -- -- -- -- -- --  CALCIUM 9.0 8.9 -- 8.5 -- -- -- -- -- -- -- -- --  MG -- -- -- -- -- -- -- -- -- -- -- -- --  PHOS -- 2.4 -- 2.9 -- -- 2.6 -- -- -- -- -- --  AST -- -- -- -- -- -- -- -- -- 608* -- -- --  ALT -- -- -- -- -- -- -- -- -- 819* -- -- --  ALKPHOS -- -- -- -- -- -- -- -- -- 54 -- -- --  BILITOT -- -- -- -- -- -- -- -- -- 0.4 -- -- --  PROT -- -- -- -- -- -- -- -- -- 5.3* -- -- --  ALBUMIN -- 2.5* -- 2.3* -- -- 2.3* -- -- -- -- -- --  APTT -- -- -- -- -- -- -- --  -- -- -- -- --  INR -- -- -- -- -- -- -- -- -- -- -- -- --  LATICACIDVEN -- -- -- -- -- -- -- 1.2 -- -- 1.7 -- 1.3  TROPONINI -- -- -- -- -- -- -- -- -- -- -- -- --  PROCALCITON -- -- -- -- -- -- 0.68 -- -- 2.35 -- 2.68 --  PROBNP -- -- -- -- -- -- -- -- -- -- -- -- --  O2SATVEN -- -- -- -- -- -- -- -- -- -- -- -- --  PHART -- -- -- -- 7.337* 7.326* -- -- 7.360 -- -- -- --  PCO2ART -- -- -- -- 37.7 43.2 -- -- 38.3 -- -- -- --  PO2ART -- -- -- -- 173.0* 81.0 -- -- 115.0* -- -- -- --   No results found for this basename: GLUCAP:5 in the last 168 hours  IMAGING:  No results found. ECG:  No new  DIAGNOSES: Principal Problem:  *Status post laparotomy, evac of hemoperitoneum and wound vac placement 11/26 Active Problems:  H/O Hypertension  Hypothyroid  Encephalopathy acute  Abdominal compartment syndrome, post lap 11/26  Shock due to abdominal compartment syndrome and acute blood loss  Hemoperitoneum, resolved post lap 11/26  Respiratory failure, post-operative  Acute renal insufficiency, nonoliguric   Ex lap with partial colectomy with revision of colon anastomosis, repair of ventral hernia 11/25  ASSESSMENT / PLAN:  PULMONARY  A:  Post op respiratory failure. 11/29 - selft extubated 11/30 and 11/01/12- maintaing airway but agitated  P:   Goal SPO2 > 92 Albuterol PRN  CARDIOVASCULAR  A: Shock, resolved. 10/31/12 and 12.1.13- normal BP/hr;  ? had hypotension last night on precedex but resolved P:  Holding antihypertensives    RENAL  Lab 11/01/12 0500 10/31/12 0415 10/30/12 0430 10/29/12 1325 10/29/12 0315  CREATININE 1.00 0.98 1.36* 1.58* 2.01*    Lab 11/01/12 0500 10/31/12 0415 10/30/12 0430 10/29/12 1325 10/29/12 1030  K 3.3* 3.3* 4.1 4.2 3.9     A:  AKI, likely secondary to compartment syndrome, improving.  Renal signed off. 11/01/12: normal creat but K low  P:  Replete kcl  Goal CVP>10 Trend BMP  GASTROINTESTINAL  A:  S/p laparotomy x  2.  Abdominal  compartment syndrome.  Hemoperitoneum.  Fascia closed 11/29. 10/31/12 and 11/01/12: no acute issues P:   Post op mgmt per CCS  HEMATOLOGIC  A:  Acute blood loss anemia, stable.  Lab 11/01/12 0500 10/31/12 0415 10/30/12 0900 10/29/12 1300 10/29/12 1030  HGB 8.5* 8.5* 8.7* 8.9* 6.1*    P:  Trend CBC PRBC for hgb < 7gm% only  INFECTIOUS  A:  No evidence of acute infection. 10/31/12 and 11/01/12 - afebrile P:   Antibiotics d/c'd by CCS ? date  ENDOCRINE   A:  Hypothyroidism. P:   Synthroid   NEUROLOGIC  A:  Acute encephalopathy.  Post surgical pain. 10/31/12: Delirium + (agitated).? Cause - Rx precedex 12//1/13: Delirium resovled while on precedex  P:   Wean slowly to off as tolerated the precedex; started 10/31/12 t Fentanyl PRN pain  CLINICAL SUMMARY:  46 yo admitted 11/24 for exploratory laparotomy, partial colectomy with revision of colon anastomosis and primary repair of ventral hernia on 11/25.  Course was complicated by hemoperitoneum and abdominal compartment syndrome with repeated laparotomy on 11/26.  Brought to ICU hypoxic, mechanically ventilated.  Fascia closed 11/28.  Extubated 10/30/12. Agitated 10/31/12- Rx precedex   Dr. Kalman Shan, M.D., [facility].C.P Pulmonary and Critical Care Medicine Staff Physician [facility] [place] [facility] Pager: [phone], If no answer or between  15:00h - 7:00h: call [phone]  11/01/2012 9:09 AM

## 2012-11-02 ENCOUNTER — Encounter (HOSPITAL_COMMUNITY): Payer: Self-pay | Admitting: General Surgery

## 2012-11-02 LAB — CBC
Hemoglobin: 10 g/dL — ABNORMAL LOW (ref 13.0–17.0)
MCH: 29 pg (ref 26.0–34.0)
Platelets: 217 10*3/uL (ref 150–400)
RBC: 3.45 MIL/uL — ABNORMAL LOW (ref 4.22–5.81)
WBC: 8.9 10*3/uL (ref 4.0–10.5)

## 2012-11-02 LAB — COMPREHENSIVE METABOLIC PANEL
ALT: 63 U/L — ABNORMAL HIGH (ref 0–53)
AST: 30 U/L (ref 0–37)
Alkaline Phosphatase: 58 U/L (ref 39–117)
CO2: 22 mEq/L (ref 19–32)
Chloride: 102 mEq/L (ref 96–112)
GFR calc Af Amer: >90 mL/min (ref >90)
GFR calc non Af Amer: >90 mL/min (ref >90)
Glucose, Bld: 93 mg/dL (ref 70–99)
Potassium: 3.2 mEq/L — ABNORMAL LOW (ref 3.5–5.1)
Sodium: 138 mEq/L (ref 135–145)
Total Bilirubin: 0.3 mg/dL (ref 0.3–1.2)

## 2012-11-02 MED ORDER — LORAZEPAM 1 MG PO TABS
1.0000 mg | ORAL_TABLET | Freq: Three times a day (TID) | ORAL | Status: DC | PRN
  Administered 2012-11-02 – 2012-11-03 (×2): 1 mg via ORAL
  Filled 2012-11-02 (×2): qty 1

## 2012-11-02 MED ORDER — VENLAFAXINE HCL 75 MG PO TABS
75.0000 mg | ORAL_TABLET | Freq: Every day | ORAL | Status: DC
  Administered 2012-11-02: 75 mg via ORAL
  Filled 2012-11-02 (×2): qty 1

## 2012-11-02 MED ORDER — VENLAFAXINE HCL 75 MG PO TABS
150.0000 mg | ORAL_TABLET | Freq: Every day | ORAL | Status: DC
  Administered 2012-11-03: 150 mg via ORAL
  Filled 2012-11-02: qty 2

## 2012-11-02 MED ORDER — POTASSIUM CHLORIDE 10 MEQ/100ML IV SOLN
10.0000 meq | INTRAVENOUS | Status: AC
  Administered 2012-11-02 (×4): 10 meq via INTRAVENOUS
  Filled 2012-11-02: qty 400

## 2012-11-02 MED ORDER — LEVOTHYROXINE SODIUM 75 MCG PO TABS
75.0000 ug | ORAL_TABLET | Freq: Every day | ORAL | Status: DC
  Filled 2012-11-02 (×2): qty 1

## 2012-11-02 MED ORDER — PANTOPRAZOLE SODIUM 40 MG PO TBEC
40.0000 mg | DELAYED_RELEASE_TABLET | Freq: Every day | ORAL | Status: DC
  Administered 2012-11-02 – 2012-11-03 (×2): 40 mg via ORAL
  Filled 2012-11-02 (×2): qty 1

## 2012-11-02 MED ORDER — OXYCODONE-ACETAMINOPHEN 5-325 MG PO TABS
1.0000 | ORAL_TABLET | ORAL | Status: DC | PRN
  Administered 2012-11-02 (×3): 2 via ORAL
  Filled 2012-11-02 (×3): qty 2

## 2012-11-02 MED ORDER — QUINAPRIL HCL 10 MG PO TABS: 20.0000 mg | ORAL_TABLET | Freq: Every day | ORAL | Status: DC

## 2012-11-02 MED ORDER — LISINOPRIL 20 MG PO TABS
20.0000 mg | ORAL_TABLET | Freq: Every day | ORAL | Status: DC
  Administered 2012-11-02 – 2012-11-03 (×2): 20 mg via ORAL
  Filled 2012-11-02 (×3): qty 1

## 2012-11-02 MED ORDER — LOSARTAN POTASSIUM 50 MG PO TABS
100.0000 mg | ORAL_TABLET | Freq: Every day | ORAL | Status: DC
  Administered 2012-11-02: 100 mg via ORAL

## 2012-11-02 MED ORDER — LOSARTAN POTASSIUM 50 MG PO TABS
100.0000 mg | ORAL_TABLET | Freq: Every day | ORAL | Status: DC
  Administered 2012-11-02 – 2012-11-03 (×2): 100 mg via ORAL
  Filled 2012-11-02 (×3): qty 2

## 2012-11-02 MED ORDER — LEVOTHYROXINE SODIUM 75 MCG PO TABS
75.0000 ug | ORAL_TABLET | Freq: Every day | ORAL | Status: DC
Start: 2012-11-03 — End: 2012-11-03
  Administered 2012-11-03: 75 ug via ORAL
  Filled 2012-11-02 (×2): qty 1

## 2012-11-02 MED ORDER — POTASSIUM CHLORIDE 2 MEQ/ML IV SOLN
INTRAVENOUS | Status: DC
  Administered 2012-11-02 – 2012-11-03 (×2): via INTRAVENOUS
  Filled 2012-11-02 (×3): qty 1000

## 2012-11-02 MED ORDER — ZOLPIDEM TARTRATE 5 MG PO TABS
10.0000 mg | ORAL_TABLET | Freq: Every evening | ORAL | Status: DC | PRN
  Administered 2012-11-02: 10 mg via ORAL
  Filled 2012-11-02: qty 2

## 2012-11-02 NOTE — Progress Notes (Signed)
PULMONARY  / CRITICAL CARE MEDICINE  Name: Douglas Wiggins MRN: [id number] DOB: 1966/08/17    LOS: 7  REFERRING MD:  [name])  CHIEF COMPLAINT:  Post op respiratory failure  BRIEF PATIENT DESCRIPTION: 46 yo admitted 11/24 for exploratory laparotomy, partial colectomy with revision of colon anastomosis and primary repair of ventral hernia on 11/25.  Course was complicated by hemoperitoneum and abdominal compartment syndrome with repeated laparotomy on 11/26.  Brought to ICU hypoxic, mechanically ventilated.  LINES / TUBES: ETT 11/26 >> 11/29 L [place] CVL 11/26 >>> R rad A line 11/26 >>> out Foley 11/26 >>>  CULTURES: 11/26  Urine >>> ntd  ANTIBIOTICS: Primaxin 11/26 >>> 11/29 Vancomycin 11/26 >>> 11/29   SIGNIFICANT EVENTS:  11/24  Admitted for elective surgery 11/25  Exploratory laparotomy, partial colectomy with revision of colon anastomosis and primary repair of ventral hernia 11/26  Repeated laparotomy for abdominal compartment syndrome, brought to ICU hypoxic, mechanically ventilated 11/28  Fascia is closed, abx are d/c'd, weaning started 11/29 getting agitated - pulled out central line and self extubated 11/30 - agitation RASS +1 to +3 continues. Needing restraints. Confused. Afebrile. RN does not think he is in pain. He gives remote hx of etoh. Currently CAGE - negative but drinks "half beer a week". Rx precedex 11/01/12: Delirium resolved while on precedex.   SUBJECTIVE/OVERNIGHT/INTERVAL HX Off precedex x 24 h, required ativan overnight   VITAL SIGNS: Temp:  [97.8 F (36.6 C)-98.6 F (37 C)] 97.9 F (36.6 C) (12/02 0803) Pulse Rate:  [57-87] 71  (12/02 0800) Resp:  [19-28] 25  (12/02 0800) BP: (75-146)/(38-96) 144/72 mmHg (12/02 0800) SpO2:  [96 %-100 %] 99 % (12/02 0800)  INTAKE / OUTPUT: Intake/Output      12/01 0701 - 12/02 0700 12/02 0701 - 12/03 0700   P.O. 1970 240   I.V. (mL/kg) 1238.8 (10.6) 50 (0.4)   NG/GT     IV Piggyback 510    Total  Intake(mL/kg) 3718.8 (31.8) 290 (2.5)   Urine (mL/kg/hr) 1955 (0.7) 250   Drains 50    Total Output 2005 250   Net +1713.8 +40        Urine Occurrence 5 x    Stool Occurrence 5 x     PHYSICAL EXAMINATION: General:  oob to chair PULM: Bilateral air entry CV: Tachy, regular, loud systolic murmur AB: Diminished bowel sounds Ext: Aarm, trace edema Neuro: Awake, RASS +1. CAM-ICU neg for delirium. GCS 15.  Non focal LABS:  Lab 11/02/12 0450 11/01/12 0500 10/31/12 0415 10/30/12 0430 10/29/12 1030 10/29/12 0547 10/29/12 0315 10/28/12 0935 10/28/12 0540 10/28/12 0410 10/28/12 0335 10/27/12 2141 10/27/12 2135  HGB 10.0* 8.5* 8.5* -- -- -- -- -- -- -- -- -- --  WBC 8.9 6.9 6.1 -- -- -- -- -- -- -- -- -- --  PLT 217 219 165 -- -- -- -- -- -- -- -- -- --  NA 138 141 145 -- -- -- -- -- -- -- -- -- --  K 3.2* 3.3* -- -- -- -- -- -- -- -- -- -- --  CL 102 105 109 -- -- -- -- -- -- -- -- -- --  CO2 22 26 26  -- -- -- -- -- -- -- -- -- --  GLUCOSE 93 120* 115* -- -- -- -- -- -- -- -- -- --  BUN 13 21 16  -- -- -- -- -- -- -- -- -- --  CREATININE 0.88 1.00 0.98 -- -- -- -- -- -- -- -- -- --  CALCIUM 8.5 9.0 8.9 -- -- -- -- -- -- -- -- -- --  MG -- -- -- -- -- -- -- -- -- -- -- -- --  PHOS -- -- 2.4 2.9 -- -- 2.6 -- -- -- -- -- --  AST 30 -- -- -- -- -- -- -- -- 608* -- -- --  ALT 63* -- -- -- -- -- -- -- -- 819* -- -- --  ALKPHOS 58 -- -- -- -- -- -- -- -- 54 -- -- --  BILITOT 0.3 -- -- -- -- -- -- -- -- 0.4 -- -- --  PROT 5.9* -- -- -- -- -- -- -- -- 5.3* -- -- --  ALBUMIN 2.5* -- 2.5* 2.3* -- -- -- -- -- -- -- -- --  APTT -- -- -- -- -- -- -- -- -- -- -- -- --  INR -- -- -- -- -- -- -- -- -- -- -- -- --  LATICACIDVEN -- -- -- -- -- -- -- 1.2 -- -- 1.7 -- 1.3  TROPONINI -- -- -- -- -- -- -- -- -- -- -- -- --  PROCALCITON -- -- -- -- -- -- 0.68 -- -- 2.35 -- 2.68 --  PROBNP -- -- -- -- -- -- -- -- -- -- -- -- --  O2SATVEN -- -- -- -- -- -- -- -- -- -- -- -- --  PHART -- -- -- -- 7.337* 7.326*  -- -- 7.360 -- -- -- --  PCO2ART -- -- -- -- 37.7 43.2 -- -- 38.3 -- -- -- --  PO2ART -- -- -- -- 173.0* 81.0 -- -- 115.0* -- -- -- --   No results found for this basename: GLUCAP:5 in the last 168 hours  IMAGING:  No results found. ECG:  No new  DIAGNOSES: Principal Problem:  *Status post laparotomy, evac of hemoperitoneum and wound vac placement 11/26 Active Problems:  H/O Hypertension  Hypothyroid  Encephalopathy acute  Abdominal compartment syndrome, post lap 11/26  Shock due to abdominal compartment syndrome and acute blood loss  Hemoperitoneum, resolved post lap 11/26  Respiratory failure, post-operative  Acute renal insufficiency, nonoliguric   Ex lap with partial colectomy with revision of colon anastomosis, repair of ventral hernia 11/25  ASSESSMENT / PLAN:  PULMONARY  A:  Post op respiratory failure. 11/29 - selft extubated  P:   Goal SPO2 > 92 Albuterol PRN  CARDIOVASCULAR  A: Shock, resolved.  P:  Resume antihypertensives  as Bp rises  RENAL  Lab 11/02/12 0450 11/01/12 0500 10/31/12 0415 10/30/12 0430 10/29/12 1325  CREATININE 0.88 1.00 0.98 1.36* 1.58*    Lab 11/02/12 0450 11/01/12 0500 10/31/12 0415 10/30/12 0430 10/29/12 1325  K 3.2* 3.3* 3.3* 4.1 4.2     A:  AKI, likely secondary to compartment syndrome, improved.  Renal signed off. Hypokalemia   P:  Replete kcl  Goal CVP>10 Trend BMP  GASTROINTESTINAL  A:  S/p laparotomy x 2.  Abdominal compartment syndrome.  Hemoperitoneum.  Fascia closed 11/29.  P:   Post op mgmt per [facility]  HEMATOLOGIC  A:  Acute blood loss anemia, stable.  Lab 11/02/12 0450 11/01/12 0500 10/31/12 0415 10/30/12 0900 10/29/12 1300  HGB 10.0* 8.5* 8.5* 8.7* 8.9*    P:  Trend CBC PRBC for hgb < 7gm% only  INFECTIOUS  A:  No evidence of acute infection.  P:   Antibiotics d/c'd by [facility] ? date  ENDOCRINE   A:  Hypothyroidism. P:   Synthroid  NEUROLOGIC  A:  Acute encephalopathy.  Post surgical  pain. 12//1/13: Delirium resovled while on precedex  P:    Fentanyl PRN pain Haldol prn delerium  CLINICAL SUMMARY:  46 yo admitted 11/24 for exploratory laparotomy, partial colectomy with revision of colon anastomosis and primary repair of ventral hernia on 11/25.  Course was complicated by hemoperitoneum and abdominal compartment syndrome with repeated laparotomy on 11/26.  Brought to ICU hypoxic, mechanically ventilated.  Fascia closed 11/28.  Extubated 10/30/12. Post op delerium - now resovled. OK to transfer to floor. PCCM  To sign off   ALVA,RAKESH V.  [phone]  11/02/2012 9:12 AM

## 2012-11-02 NOTE — Progress Notes (Signed)
4 Days Post-Op  Subjective: Patient awake, alert, oriented Pain well-controlled Had bowel movements yesterday, per patient Voiding well  Objective: Vital signs in last 24 hours: Temp:  [97.8 F (36.6 C)-98.6 F (37 C)] 97.9 F (36.6 C) (12/02 0803) Pulse Rate:  [57-87] 71  (12/02 0800) Resp:  [17-28] 25  (12/02 0800) BP: (75-146)/(38-96) 144/72 mmHg (12/02 0800) SpO2:  [96 %-100 %] 99 % (12/02 0800) Last BM Date:  (preop) 11/01/12  Intake/Output from previous day: 12/01 0701 - 12/02 0700 In: 3718.8 [P.O.:1970; I.V.:1238.8; IV Piggyback:510] Out: 2005 [Urine:1955; Drains:50] Intake/Output this shift: Total I/O In: 290 [P.O.:240; I.V.:50] Out: 250 [Urine:250]  General appearance: alert, cooperative and no distress Resp: clear to auscultation bilaterally GI: soft, non-distended; Wound - VAC removed; clean; well-granulated  Lab Results:   Basename 11/02/12 0450 11/01/12 0500  WBC 8.9 6.9  HGB 10.0* 8.5*  HCT 28.7* 24.2*  PLT 217 219   BMET  Basename 11/02/12 0450 11/01/12 0500  NA 138 141  K 3.2* 3.3*  CL 102 105  CO2 22 26  GLUCOSE 93 120*  BUN 13 21  CREATININE 0.88 1.00  CALCIUM 8.5 9.0   PT/INR No results found for this basename: LABPROT:2,INR:2 in the last 72 hours ABG No results found for this basename: PHART:2,PCO2:2,PO2:2,HCO3:2 in the last 72 hours  Studies/Results: No results found.  Anti-infectives: Anti-infectives     Start     Dose/Rate Route Frequency Ordered Stop   10/29/12 2030   imipenem-cilastatin (PRIMAXIN) 500 mg in sodium chloride 0.9 % 100 mL IVPB  Status:  Discontinued        500 mg 200 mL/hr over 30 Minutes Intravenous 4 times per day 10/29/12 2024 10/30/12 0841   10/29/12 1000   vancomycin (VANCOCIN) IVPB 1000 mg/200 mL premix  Status:  Discontinued        1,000 mg 200 mL/hr over 60 Minutes Intravenous Every 12 hours 10/29/12 0915 10/30/12 0841   10/28/12 2200   imipenem-cilastatin (PRIMAXIN) 500 mg in sodium chloride 0.9 %  100 mL IVPB  Status:  Discontinued        500 mg 200 mL/hr over 30 Minutes Intravenous 3 times per day 10/28/12 1217 10/29/12 2024   10/28/12 1930   vancomycin (VANCOCIN) 1,500 mg in sodium chloride 0.9 % 500 mL IVPB  Status:  Discontinued        1,500 mg 250 mL/hr over 120 Minutes Intravenous Every 24 hours 10/27/12 1758 10/29/12 0915   10/27/12 1930   vancomycin (VANCOCIN) 2,000 mg in sodium chloride 0.9 % 500 mL IVPB        2,000 mg 250 mL/hr over 120 Minutes Intravenous  Once 10/27/12 1757 10/27/12 2124   10/27/12 1830   imipenem-cilastatin (PRIMAXIN) 250 mg in sodium chloride 0.9 % 100 mL IVPB        250 mg 200 mL/hr over 30 Minutes Intravenous 4 times per day 10/27/12 1758 10/28/12 1247   10/27/12 1330   ceFAZolin (ANCEF) IVPB 2 g/50 mL premix        2 g 100 mL/hr over 30 Minutes Intravenous  Once 10/27/12 1300 10/27/12 1501   10/26/12 1600   cefOXitin (MEFOXIN) 1 g in dextrose 5 % 50 mL IVPB        1 g 100 mL/hr over 30 Minutes Intravenous Every 6 hours 10/26/12 1517 10/27/12 0417   10/26/12 0600   ceFAZolin (ANCEF) 3 g in dextrose 5 % 50 mL IVPB  3 g 160 mL/hr over 30 Minutes Intravenous On call to O.R. 10/25/12 1337 10/26/12 0915          Assessment/Plan: s/p Procedure(s) (LRB) with comments: EXPLORATORY LAPAROTOMY (N/A) - exploratory laparotomy, partial closure of abdominal wound,   APPLICATION OF WOUND VAC () Transfer to floor Physical therapy - eval and treat WOCN - to apply VAC Home health - with [employer]CI VAC Advance diet PO pain meds Hopefully home in next 2 days.   LOS: 7 days    Douglas Wiggins K. 11/02/2012

## 2012-11-02 NOTE — Consult Note (Addendum)
WOC consult Note Reason for Consult: Consult requested for abd vac dressing.  Dr [name] in earlier to assess wound during first post-op dressing change. Wound type: Full thickness Measurement:19X11X3cm, no tunneling or undermining. Wound bed: 100% beefy red Drainage (amount, consistency, odor) No  Odor, mod pink drainage. Periwound: Intact skin surrounding. Dressing procedure/placement/frequency: Applied Mepitel over wound bed to protect and one piece black sponge to cont suction.  Pt tolerated with minimal discomfort.  Bedside nurse can change Q M/W/F   Cammie Mcgee, RN, MSN, [employer]  [phone]

## 2012-11-02 NOTE — Progress Notes (Signed)
Received patient from 2300. Alert and oriented, ambulatory, VSS,not in any distress. Will moniotr.

## 2012-11-02 NOTE — Evaluation (Signed)
Physical Therapy Evaluation Patient Details Name: Douglas Wiggins MRN: [id number] DOB: August 13, 1966 Today's Date: 11/02/2012 Time: 4098-1191 PT Time Calculation (min): 24 min  PT Assessment / Plan / Recommendation Clinical Impression  pt s/p ventral hernia repair with post op complications.  Now resolved and wound management being handled well with VAC.  Pt is a Independent level and pt is approaching baseline mobility.  D/C from PT, no further needs.    PT Assessment  Patent does not need any further PT services    Follow Up Recommendations  No PT follow up    Does the patient have the potential to tolerate intense rehabilitation      Barriers to Discharge        Equipment Recommendations  None recommended by PT    Recommendations for Other Services     Frequency      Precautions / Restrictions Restrictions Weight Bearing Restrictions: No   Pertinent Vitals/Pain       Mobility  Bed Mobility Bed Mobility: Not assessed Transfers Transfers: Sit to Stand;Stand to Sit Sit to Stand: 7: Independent Stand to Sit: 7: Independent Ambulation/Gait Ambulation/Gait Assistance: 7: Independent Ambulation Distance (Feet): 900 Feet Assistive device: None Ambulation/Gait Assistance Details: WFL Gait Pattern: Within Functional Limits Gait velocity: subjectively at community level Stairs: Yes Stairs Assistance: 7: Independent Stair Management Technique: No rails;Alternating pattern;Forwards Number of Stairs: 4  (limited by lines)    Shoulder Instructions     Exercises     PT Diagnosis:    PT Problem List:   PT Treatment Interventions:     PT Goals    Visit Information  Last PT Received On: 11/02/12 Assistance Needed: +1    Subjective Data  Subjective: I can't heal up at home, I don't think I need to stay here Patient Stated Goal: Home, back to work   Prior Functioning  Home Living Lives With: Alone;Other (Comment) (but going to his parents for 1-3 weeks) Available  Help at Discharge: Family Type of Home: House Prior Function Level of Independence: Independent Able to Take Stairs?: Yes Driving: Yes Vocation: Full time employment Communication Communication: No difficulties Dominant Hand: Right    Cognition  Arousal/Alertness: Awake/alert Orientation Level: Oriented X4 / Intact Behavior During Session: WFL for tasks performed    Extremity/Trunk Assessment Right Upper Extremity Assessment RUE ROM/Strength/Tone: Within functional levels Left Upper Extremity Assessment LUE ROM/Strength/Tone: Within functional levels Right Lower Extremity Assessment RLE ROM/Strength/Tone: Within functional levels Left Lower Extremity Assessment LLE ROM/Strength/Tone: Within functional levels   Balance Balance Balance Assessed:  (balance WFL and generally age appropriate)  End of Session PT - End of Session Activity Tolerance: Patient tolerated treatment well Patient left:  (I in room) Nurse Communication: Mobility status  GP     Trudi Morgenthaler, Eliseo Gum 11/02/2012, 5:51 PM  11/02/2012  [place] Bing, PT [phone] (pager)

## 2012-11-03 MED ORDER — OXYCODONE-ACETAMINOPHEN 5-325 MG PO TABS: 1.0000 | ORAL_TABLET | ORAL | Status: DC | PRN

## 2012-11-03 MED ORDER — PRO-STAT SUGAR FREE PO LIQD: 30.0000 mL | Freq: Two times a day (BID) | ORAL | Status: DC

## 2012-11-03 MED ORDER — PRO-STAT SUGAR FREE PO LIQD
30.0000 mL | Freq: Two times a day (BID) | ORAL | Status: DC
  Filled 2012-11-03: qty 30

## 2012-11-03 NOTE — Progress Notes (Signed)
Nutrition Follow-up  Intervention:    Prostat liquid protein 30 ml PO daily with lunch & dinner (100 kcals, 15 gm protein per dose) RD to follow for nutrition care plan  Assessment:   Patient s/p exploratory laparotomy & application of abdominal wound VAC 11/28.  Extubated 11/29.  Patient states his appetite is good.  PO intake 100% per flowsheet records.  CWOCN note reviewed 12/2.  Would benefit from additional kcal, protein with supplementation -- RD to order.  Diet Order:  Regular  Meds: Scheduled Meds:   . heparin subcutaneous  5,000 Units Subcutaneous Q8H  . levothyroxine  75 mcg Oral QAC breakfast  . levothyroxine  75 mcg Oral QAC breakfast  . lisinopril  20 mg Oral Daily  . losartan  100 mg Oral Daily  . pantoprazole  40 mg Oral Daily  . [COMPLETED] potassium chloride  10 mEq Intravenous Q1 Hr x 4  . venlafaxine  150 mg Oral Daily  . venlafaxine  75 mg Oral QHS  . [DISCONTINUED] losartan  100 mg Oral Q supper  . [DISCONTINUED] quinapril  20 mg Oral Daily   Continuous Infusions:   . dextrose 5 % and 0.9% NaCl 1,000 mL with potassium chloride 20 mEq infusion 50 mL/hr at 11/03/12 0108   PRN Meds:.albuterol, fentaNYL, LORazepam, oxyCODONE-acetaminophen, zolpidem   CMP     Component Value Date/Time   NA 138 11/02/2012 0450   K 3.2* 11/02/2012 0450   CL 102 11/02/2012 0450   CO2 22 11/02/2012 0450   GLUCOSE 93 11/02/2012 0450   BUN 13 11/02/2012 0450   CREATININE 0.88 11/02/2012 0450   CALCIUM 8.5 11/02/2012 0450   PROT 5.9* 11/02/2012 0450   ALBUMIN 2.5* 11/02/2012 0450   AST 30 11/02/2012 0450   ALT 63* 11/02/2012 0450   ALKPHOS 58 11/02/2012 0450   BILITOT 0.3 11/02/2012 0450   GFRNONAA >90 11/02/2012 0450   GFRAA >90 11/02/2012 0450     Intake/Output Summary (Last 24 hours) at 11/03/12 1154 Last data filed at 11/03/12 0900  Gross per 24 hour  Intake 2268.33 ml  Output    120 ml  Net 2148.33 ml    Weight Status:  116.9 kg (12/3) -- stable  Re-estimated needs:   2200-2400 kcals, 120-130 gm protein  Nutrition Dx:  Inadequate Oral Intake, resolved New Nutrition Dx: Increased Nutrient Needs r/t wound healing as evidenced by estimated nutrition needs, ongoing  New Goal:  Oral intake with meals & supplements to meet >/= 90% of estimated nutrition needs, progressing  Monitor:  PO & supplemental intake, weight, labs, I/O's  Kirkland Hun, RD, LDN Pager #: [phone] After-Hours Pager #: [phone]

## 2012-11-03 NOTE — Discharge Summary (Signed)
Physician Discharge Summary  Patient ID: Douglas Wiggins MRN: [id number] DOB/AGE: 1966/08/16 46 y.o.  Admit date: 10/26/2012 Discharge date: 11/03/2012  Admission Diagnoses:  Ventral hernia  Discharge Diagnoses: Ventral hernia    Abdominal compartment syndrome    Respiratory failure    Acute renal insufficiency Principal Problem:  *Status post laparotomy, evac of hemoperitoneum and wound vac placement 11/26 Active Problems:  H/O Hypertension  Hypothyroid  Encephalopathy acute  Abdominal compartment syndrome, post lap 11/26  Shock due to abdominal compartment syndrome and acute blood loss  Hemoperitoneum, resolved post lap 11/26  Respiratory failure, post-operative  Acute renal insufficiency, nonoliguric   Ex lap with partial colectomy with revision of colon anastomosis, repair of ventral hernia 11/25   Discharged Condition: good  Hospital Course: He underwent exploratory laparotomy on 11/25 with revision of his colon anastomosis and primary fascial closure repair of the ventral hernia.  He developed some post-operative bleeding and developed abdominal compartment syndrome, resulting in hypotension and acute renal insufficiency.  He was taken emergently to the OR on 10/27/12 and had exploratory laparotomy with open abdominal vac placement.  He improved over the next couple of days, and we reexplored him on 11/28.  No further bleeding was noted.  His abdomen was closed with relaxing incisions and primary closure.  A wound VAC was placed.  He has progressed nicely and is now having bowel movements and tolerating a regular diet.    Consults: pulmonary/intensive care and nephrology   Treatments: respiratory therapy: mechanical ventilation for 3 days and surgery: see above.  Discharge Exam: Blood pressure 152/87, pulse 78, temperature 98 F (36.7 C), temperature source Oral, resp. rate 20, height 6\' 4"  (1.93 m), weight 257 lb 11.5 oz (116.9 kg), SpO2 100.00%. GI: soft, minimally  tender VAC to midline wound with good seal  Disposition: 01-Home or Self Care Home health nursing for MWF VAC changes.   Discharge Orders    Future Appointments: Provider: Department: Dept Phone: Center:   11/19/2012 10:00 AM [name]. Aneri Slagel, MD [facility], [place] [phone] None     Future Orders Please Complete By Expires   Diet general      Increase activity slowly      May walk up steps      Driving Restrictions      Comments:   Do not drive while taking pain medications   Call MD for:  temperature >100.4      Call MD for:  persistant nausea and vomiting      Call MD for:  severe uncontrolled pain      Call MD for:  redness, tenderness, or signs of infection (pain, swelling, redness, odor or green/yellow discharge around incision site)      Discharge instructions      Comments:   Home Health nursing - KCI VAC to open abdominal wound - large black sponge at 125 mmHg.  Change MWF   [facility]      [facility], [place] [phone]  OPEN ABDOMINAL SURGERY: POST OP INSTRUCTIONS  Always review your discharge instruction sheet given to you by the facility where your surgery was performed.  IF YOU HAVE DISABILITY OR FAMILY LEAVE FORMS, YOU MUST BRING THEM TO THE OFFICE FOR PROCESSING.  PLEASE DO NOT GIVE THEM TO YOUR DOCTOR.  A prescription for pain medication may be given to you upon discharge.  Take your pain medication as prescribed, if needed.  If narcotic pain medicine is not needed, then you may take acetaminophen (Tylenol)  or ibuprofen (Advil) as needed. Take your usually prescribed medications unless otherwise directed. If you need a refill on your pain medication, please contact your pharmacy. They will contact our office to request authorization.  Prescriptions will not be filled after 5pm or on week-ends. You should follow a light diet the first few days after arrival home, such as soup and crackers, pudding, etc.unless your doctor has advised otherwise. A  high-fiber, low fat diet can be resumed as tolerated.   Be sure to include lots of fluids daily. Most patients will experience some swelling and bruising on the chest and neck area.  Ice packs will help.  Swelling and bruising can take several days to resolve Most patients will experience some swelling and bruising in the area of the incision. Ice pack will help. Swelling and bruising can take several days to resolve..  It is common to experience some constipation if taking pain medication after surgery.  Increasing fluid intake and taking a stool softener will usually help or prevent this problem from occurring.  A mild laxative (Milk of Magnesia or Miralax) should be taken according to package directions if there are no bowel movements after 48 hours.   ACTIVITIES:  You may resume regular (light) daily activities beginning the next day-such as daily self-care, walking, climbing stairs-gradually increasing activities as tolerated.  You may have sexual intercourse when it is comfortable.  Refrain from any heavy lifting or straining until approved by your doctor. You may drive when you no longer are taking prescription pain medication, you can comfortably wear a seatbelt, and you can safely maneuver your car and apply brakes Return to Work: ___________________________________ Douglas Wiggins should see your doctor in the office for a follow-up appointment approximately two weeks after your surgery.  Make sure that you call for this appointment within a day or two after you arrive home to insure a convenient appointment time. OTHER INSTRUCTIONS:  _____________________________________________________________ _____________________________________________________________  WHEN TO CALL YOUR DOCTOR: Fever over 101.0 Inability to urinate Nausea and/or vomiting Extreme swelling or bruising Continued bleeding from incision. Increased pain, redness, or drainage from the incision. Difficulty swallowing or  breathing Muscle cramping or spasms. Numbness or tingling in hands or feet or around lips.  The clinic staff is available to answer your questions during regular business hours.  Please don't hesitate to call and ask to speak to one of the nurses if you have concerns.  For further questions, please visit [email or web address]   Discharge wound care:      Comments:   If home VAC not available, please remove VAC and place wet to dry dressing with Kerlix.  This can be changed by family once a day until Home Health is able to place the [facility].       Medication List     As of 11/03/2012 12:25 PM    TAKE these medications         levothyroxine 75 MCG tablet   Commonly known as: SYNTHROID, LEVOTHROID   Take 75 mcg by mouth daily with supper.      losartan 100 MG tablet   Commonly known as: COZAAR   100 mg daily with supper.      meloxicam 15 MG tablet   Commonly known as: MOBIC   Take 7.5 mg by mouth daily.      oxyCODONE-acetaminophen 5-325 MG per tablet   Commonly known as: PERCOCET/ROXICET   Take 1-2 tablets by mouth every 4 (four) hours as needed.  quinapril 20 MG tablet   Commonly known as: ACCUPRIL   Take 20 mg by mouth daily.      traMADol 50 MG tablet   Commonly known as: ULTRAM   Take 50 mg by mouth every 6 (six) hours as needed. For pain      venlafaxine 75 MG tablet   Commonly known as: EFFEXOR   Take 150 mg by mouth.      zolpidem 10 MG tablet   Commonly known as: AMBIEN   Take 5-10 mg by mouth at bedtime as needed. For sleep           Follow-up Information    Follow up with Abdoul Encinas K., MD. In 2 weeks.   Contact information:   [address] [place] [address] [phone]          Signed: Wynona Luna. 11/03/2012, 12:25 PM

## 2012-11-03 NOTE — Progress Notes (Signed)
Pt to d/c home with VAC.  [employer] form completed. Once signed by MD, will fax and await approval.  Hopeful for this afternoon. HHRN will be arranged by pt choice with [facility]. Mother is able to change wet-to-dry dressings if the pt has to go home with that in place and wait for [facility] arrival.  [employer] is at times slow with approval so I will know more this afternoon after [employer] receives info and reviews it. PT, RN, [facility] all aware of plan.

## 2012-11-03 NOTE — Progress Notes (Signed)
5 Days Post-Op  Subjective: Doing quite well - back on home meds Wants to go home Awaiting home health for MWF VAC change  Objective: Vital signs in last 24 hours: Temp:  [98 F (36.7 C)-98.8 F (37.1 C)] 98 F (36.7 C) (12/03 0532) Pulse Rate:  [69-78] 78  (12/03 0532) Resp:  [18-25] 20  (12/03 0532) BP: (133-158)/(75-95) 152/87 mmHg (12/03 0532) SpO2:  [95 %-100 %] 100 % (12/03 0532) Last BM Date: 11/02/12  Intake/Output from previous day: 12/02 0701 - 12/03 0700 In: 2368.3 [P.O.:840; I.V.:1428.3; IV Piggyback:100] Out: 370 [Urine:250; Drains:120] Intake/Output this shift:    GI: soft, non-tender; bowel sounds normal; no masses,  no organomegaly and    VACwith good seal  Lab Results:   Basename 11/02/12 0450 11/01/12 0500  WBC 8.9 6.9  HGB 10.0* 8.5*  HCT 28.7* 24.2*  PLT 217 219   BMET  Basename 11/02/12 0450 11/01/12 0500  NA 138 141  K 3.2* 3.3*  CL 102 105  CO2 22 26  GLUCOSE 93 120*  BUN 13 21  CREATININE 0.88 1.00  CALCIUM 8.5 9.0   PT/INR No results found for this basename: LABPROT:2,INR:2 in the last 72 hours ABG No results found for this basename: PHART:2,PCO2:2,PO2:2,HCO3:2 in the last 72 hours  Studies/Results: No results found.  Anti-infectives: Anti-infectives     Start     Dose/Rate Route Frequency Ordered Stop   10/29/12 2030   imipenem-cilastatin (PRIMAXIN) 500 mg in sodium chloride 0.9 % 100 mL IVPB  Status:  Discontinued        500 mg 200 mL/hr over 30 Minutes Intravenous 4 times per day 10/29/12 2024 10/30/12 0841   10/29/12 1000   vancomycin (VANCOCIN) IVPB 1000 mg/200 mL premix  Status:  Discontinued        1,000 mg 200 mL/hr over 60 Minutes Intravenous Every 12 hours 10/29/12 0915 10/30/12 0841   10/28/12 2200   imipenem-cilastatin (PRIMAXIN) 500 mg in sodium chloride 0.9 % 100 mL IVPB  Status:  Discontinued        500 mg 200 mL/hr over 30 Minutes Intravenous 3 times per day 10/28/12 1217 10/29/12 2024   10/28/12 1930    vancomycin (VANCOCIN) 1,500 mg in sodium chloride 0.9 % 500 mL IVPB  Status:  Discontinued        1,500 mg 250 mL/hr over 120 Minutes Intravenous Every 24 hours 10/27/12 1758 10/29/12 0915   10/27/12 1930   vancomycin (VANCOCIN) 2,000 mg in sodium chloride 0.9 % 500 mL IVPB        2,000 mg 250 mL/hr over 120 Minutes Intravenous  Once 10/27/12 1757 10/27/12 2124   10/27/12 1830   imipenem-cilastatin (PRIMAXIN) 250 mg in sodium chloride 0.9 % 100 mL IVPB        250 mg 200 mL/hr over 30 Minutes Intravenous 4 times per day 10/27/12 1758 10/28/12 1247   10/27/12 1330   ceFAZolin (ANCEF) IVPB 2 g/50 mL premix        2 g 100 mL/hr over 30 Minutes Intravenous  Once 10/27/12 1300 10/27/12 1501   10/26/12 1600   cefOXitin (MEFOXIN) 1 g in dextrose 5 % 50 mL IVPB        1 g 100 mL/hr over 30 Minutes Intravenous Every 6 hours 10/26/12 1517 10/27/12 0417   10/26/12 0600   ceFAZolin (ANCEF) 3 g in dextrose 5 % 50 mL IVPB        3 g 160 mL/hr over 30 Minutes  Intravenous On call to O.R. 10/25/12 1337 10/26/12 0915          Assessment/Plan: s/p Procedure(s) (LRB) with comments: EXPLORATORY LAPAROTOMY (N/A) - exploratory laparotomy, partial closure of abdominal wound,   APPLICATION OF WOUND VAC () Discharge if home health VAC is arranged.  LOS: 8 days    Jorell Agne K. 11/03/2012

## 2012-11-03 NOTE — Progress Notes (Signed)
Discharge home with wound vac to abdomen

## 2012-11-09 ENCOUNTER — Telehealth (INDEPENDENT_AMBULATORY_CARE_PROVIDER_SITE_OTHER): Payer: Self-pay | Admitting: General Surgery

## 2012-11-09 ENCOUNTER — Ambulatory Visit (INDEPENDENT_AMBULATORY_CARE_PROVIDER_SITE_OTHER): Payer: BC Managed Care – PPO | Admitting: Surgery

## 2012-11-09 DIAGNOSIS — Z9889 Other specified postprocedural states: Secondary | ICD-10-CM

## 2012-11-09 MED ORDER — PROMETHAZINE HCL 12.5 MG PO TABS: 25.0000 mg | ORAL_TABLET | Freq: Four times a day (QID) | ORAL | Status: DC | PRN

## 2012-11-09 MED ORDER — LORAZEPAM 1 MG PO TABS: 1.0000 mg | ORAL_TABLET | Freq: Three times a day (TID) | ORAL | Status: DC

## 2012-11-09 NOTE — Progress Notes (Signed)
Patient's mother called for PRN nausea medicine and anti-anxiety medication.  I gave him a prescription for Phenergan and Ativan.  Keep his regular follow-up visit as scheduled.  [name]. [name], MD, [facility] Surgery  11/09/2012 1:57 PM

## 2012-11-09 NOTE — Telephone Encounter (Signed)
Pt's mother called to request meds for nausea and another for anxiety for her son.  He is having trouble eating secondary to nausea.  Also he cannot rest very well, so wants med for anxiety.  If possible, call to [facility] [phone].

## 2012-11-10 ENCOUNTER — Telehealth (INDEPENDENT_AMBULATORY_CARE_PROVIDER_SITE_OTHER): Payer: Self-pay | Admitting: General Surgery

## 2012-11-10 NOTE — Telephone Encounter (Signed)
Patient mother called in stating the patient has been running a fever of 100.8 and wanted to know if he could take Tylenol. Call placed on hold and I asked Debbe Odea, RN (working triage) as a consult. She advised that he needs to drink plenty of fluids and can take one regular strength Tylenol with his pain medication which already has Tylenol in it. He can take th Tylenol every 4 hours. Patient mother stated that she will keep him drinking fluids and will try the Tylenol again. He had taken some earlier and threw it up. She confirmed he has been receiving wound care. Nurse was there yesterday and will be there again tomorrow. I advised that the nurse will look out for any signs of infection and advise.

## 2012-11-16 ENCOUNTER — Telehealth (INDEPENDENT_AMBULATORY_CARE_PROVIDER_SITE_OTHER): Payer: Self-pay | Admitting: General Surgery

## 2012-11-16 NOTE — Telephone Encounter (Signed)
Inetta Fermo, nurse with [facility], called to report the pt's wound has opened slightly at the proximal end and is about 2 cm deep.  There is no exudate or fever.  She is packing with black foam; pt has wound vac.  Next appt is Thursday, 11/19/12 in the morning.  Paged and updated Dr. [name].  OK to wait for appt later this week.  Pt to call if any drainage, odor or fever develops.  Pt and home health nurse updated.

## 2012-11-18 ENCOUNTER — Ambulatory Visit (INDEPENDENT_AMBULATORY_CARE_PROVIDER_SITE_OTHER): Payer: BC Managed Care – PPO | Admitting: Surgery

## 2012-11-18 ENCOUNTER — Encounter (INDEPENDENT_AMBULATORY_CARE_PROVIDER_SITE_OTHER): Payer: Self-pay | Admitting: Surgery

## 2012-11-18 VITALS — BP 121/80 | HR 77 | Temp 98.6°F | Resp 18 | Ht 76.0 in | Wt 236.8 lb

## 2012-11-18 DIAGNOSIS — Z9889 Other specified postprocedural states: Secondary | ICD-10-CM

## 2012-11-18 MED ORDER — OXYCODONE-ACETAMINOPHEN 5-325 MG PO TABS: 1.0000 | ORAL_TABLET | ORAL | Status: DC | PRN

## 2012-11-18 NOTE — Progress Notes (Signed)
This patient comes in for his initial postoperative visit. On 10/26/12 the patient underwent exploratory laparotomy. Our original plan was to perform an open ventral hernia repair with mesh. However at the time of surgery his descending colon anastomosis was seen to be densely adherent to the abdominal wall. This required resection and revision of the anastomosis. Therefore we are unable to repair his hernia with mesh. His fascia was closed primarily with permanent suture. Unfortunately on postoperative day #1 the patient developed abdominal compartment syndrome and actually went into renal failure due to increased intra-abdominal pressure. He required emergent exploration and was on the ventilator for several days with his abdomen open. We were able to get his abdomen closed with lateral relaxing incisions. A wound VAC was placed and the patient was discharged home on 11/03/12. Home health has been changing his [facility] Monday Wednesday Friday. The patient continues to have some lateral abdominal pain. He also gets pain after eating. He is having daily bowel movements. Over the last couple days he has seen some blood clot with his bowel movements. He denies any fever. His appetite is improving. His overall mood is rather depressed as he has had a lot of medical issues over the last year.  Filed Vitals:   11/18/12 1525  BP: 121/80  Pulse: 77  Temp: 98.6 F (37 C)  Resp: 18   From the patient's abdomen is soft and nondistended. The wound VAC is removed. The wound measures about 9 x 8 cm. The majority of this wound is well granulated. However at the superior end of his wound there seems to be some necrosis of the fascia. The sutures are visible. Some of the necrotic fascia is debrided. This only tunnels about 2 cm deep. There is no visible bowel. The VAC is replaced.Rectal examination shows no sign of hemorrhoid disease or anal fissures.  I spent some time with the patient and his mother discussing his expected  recovery. I realize that he may have a depressed mood do to his medical issues. I encouraged him that the worst should be behind him. His wound should heal quickly with the VAC.  I encouraged him to establish a daily routine and to try to keep himself busy. He may use his abdominal binder to help with the abdominal soreness. A little bit of visible blood clot with bowel movements is not worrisome as this is likely from the anastomosis.The necrotic fascia is not unexpected. Continue with the VAC dressings until the wound is completely healed.Recheck 2 weeks.  [name]. [name], MD, [facility] Surgery  11/18/2012 5:41 PM

## 2012-11-19 ENCOUNTER — Encounter (INDEPENDENT_AMBULATORY_CARE_PROVIDER_SITE_OTHER): Payer: Self-pay | Admitting: Surgery

## 2012-12-03 IMAGING — CR DG CHEST 2V
2 series · 2 of 2 positions shown · non-contrast
Comparison: 09/20/2010 and earlier.

CLINICAL DATA: 45-year-old male with hypertension.  Preoperative
study for colon surgery.

CHEST - 2 VIEW

[w chest pa]
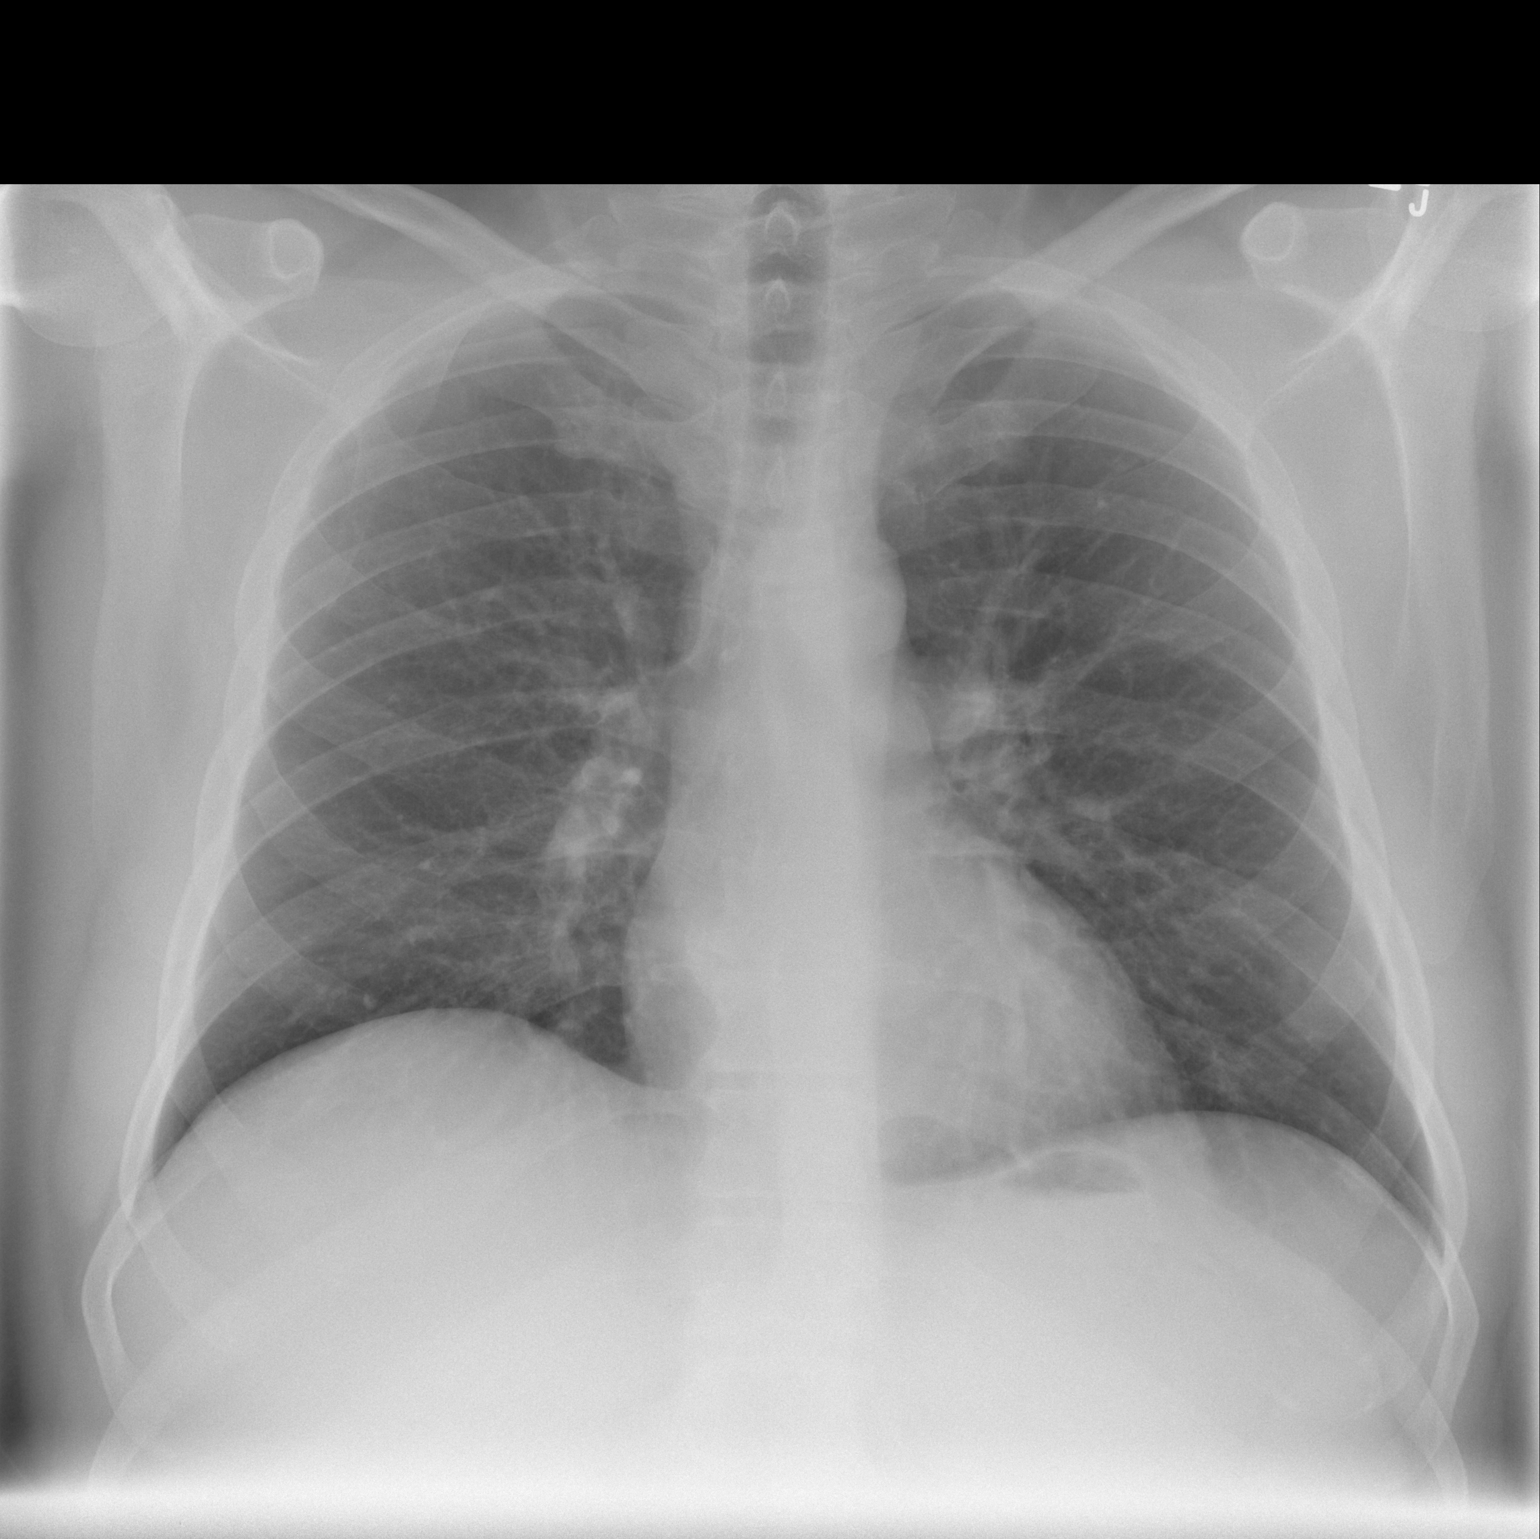

[w chest lat]
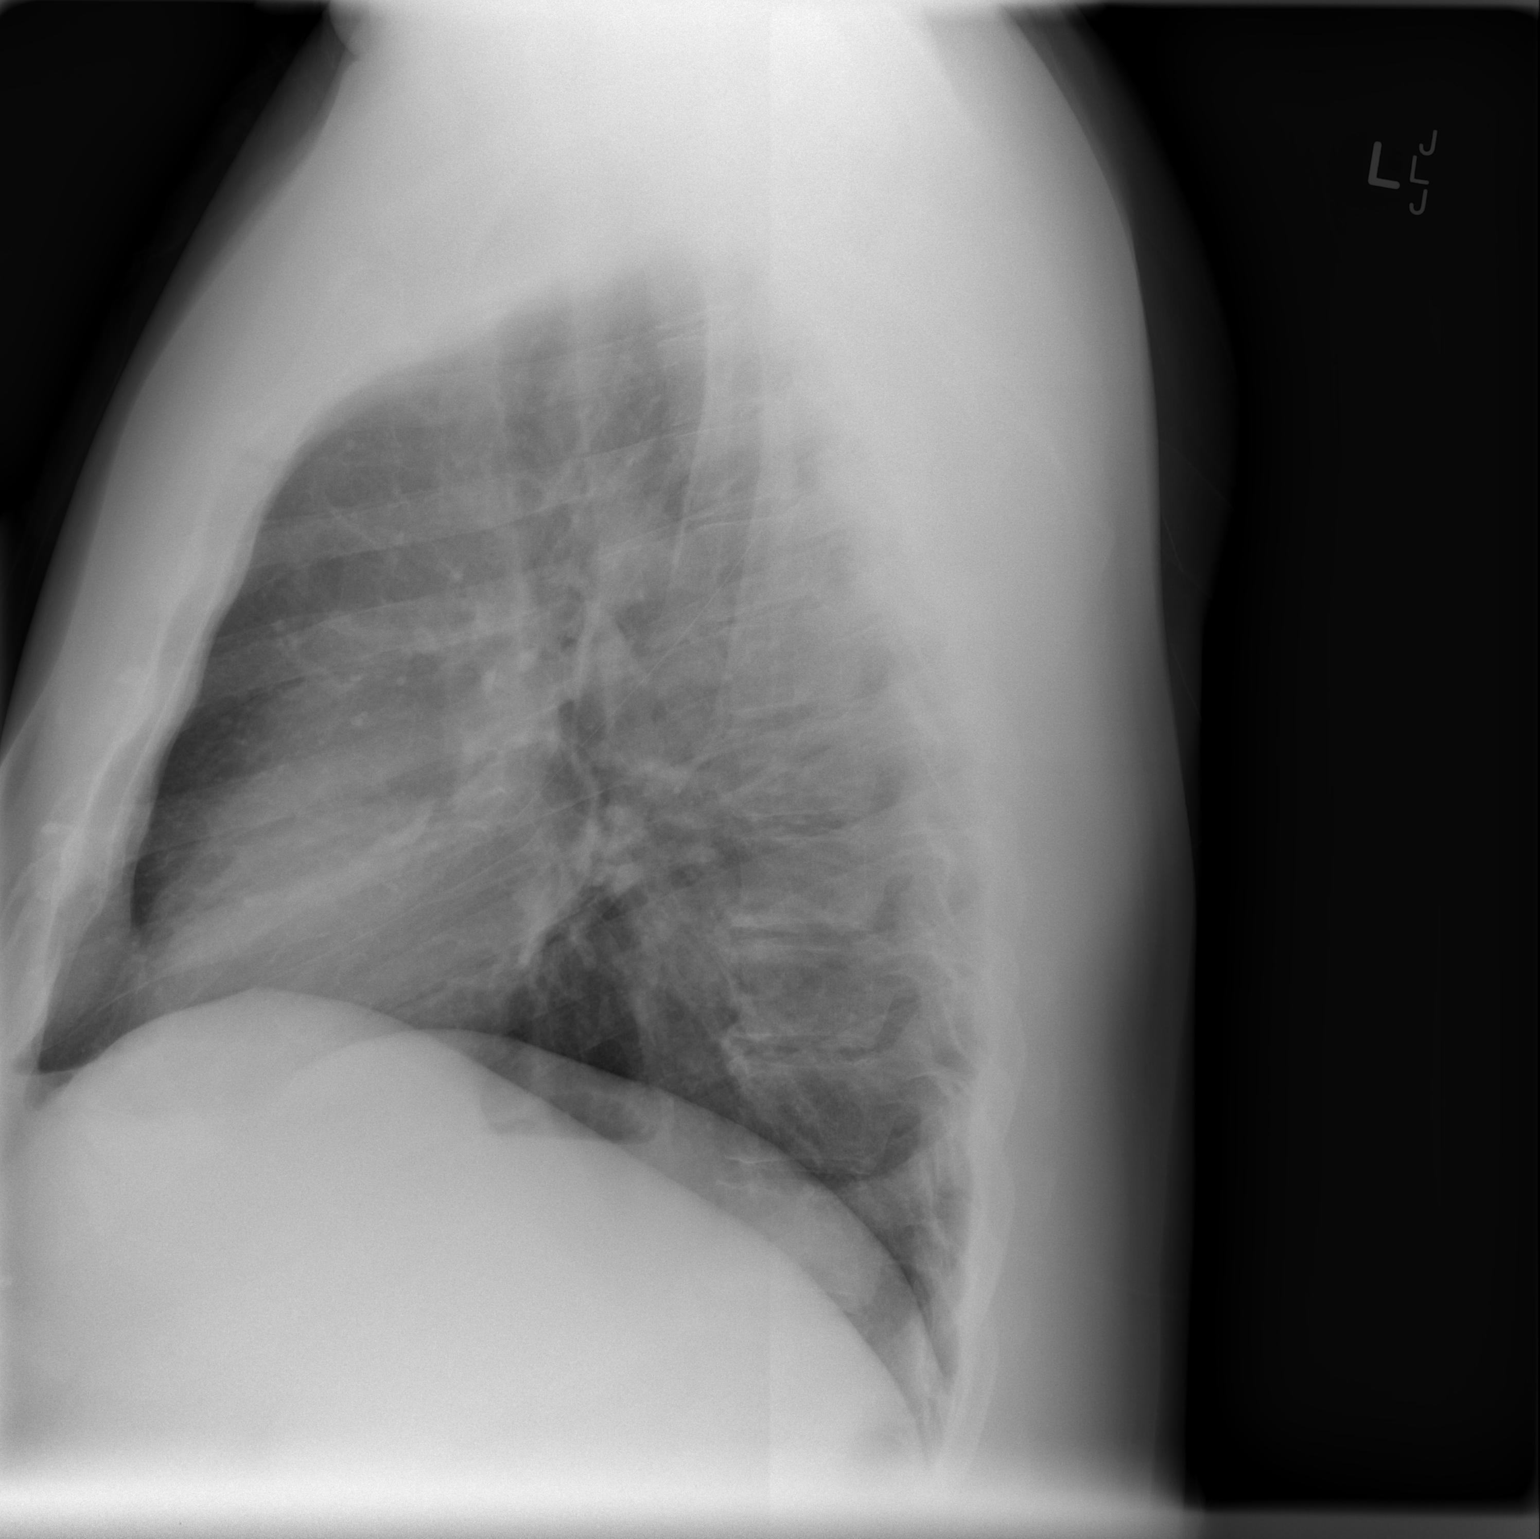

[2 of 2 positions shown; findings below may reference images not displayed]

FINDINGS: Lung volumes remain normal. Normal cardiac size and
mediastinal contours.  Visualized tracheal air column is within
normal limits.  The lungs are clear. No acute osseous abnormality
identified.
IMPRESSION: Negative, no acute cardiopulmonary abnormality.

## 2012-12-04 ENCOUNTER — Ambulatory Visit (INDEPENDENT_AMBULATORY_CARE_PROVIDER_SITE_OTHER): Payer: BC Managed Care – PPO | Admitting: Surgery

## 2012-12-04 ENCOUNTER — Encounter (INDEPENDENT_AMBULATORY_CARE_PROVIDER_SITE_OTHER): Payer: Self-pay | Admitting: Surgery

## 2012-12-04 VITALS — BP 122/82 | HR 98 | Temp 97.4°F | Ht 76.0 in | Wt 146.8 lb

## 2012-12-04 DIAGNOSIS — K439 Ventral hernia without obstruction or gangrene: Secondary | ICD-10-CM

## 2012-12-04 MED ORDER — OXYCODONE-ACETAMINOPHEN 5-325 MG PO TABS: 1.0000 | ORAL_TABLET | ORAL | Status: DC | PRN

## 2012-12-04 NOTE — Progress Notes (Signed)
He returns for a post-op visit.  He has not noted any more blood clots in his bowel movements.  He is having daily bowel movements and his appetite has returned to normal.  His abdominal pain in the lateral abdomen bilaterally is slowly improving.  His wound is healing nicely with the VAC.  Unfortunately, he was terminated from his job and is quite upset about that development.    Filed Vitals:   12/04/12 1115  BP: 122/82  Pulse: 98  Temp: 97.4 F (36.3 C)   His abdomen is soft with minimal lateral tenderness bilaterally.  The VAC is removed.  The wound is well-granulated flush with the surrounding skin.  Superiorly, minimal tunneling about 1 cm deep.  Some visible sutures.    Impression:  S/p primary repair of large ventral hernia with relaxing incisions and primary closure, complicated by post-op abdominal compartment syndrome.  Wound healing by secondary intention.    Patient is at elevated risk for hernia recurrence due to the absence of mesh (could not be placed in a contaminated environment due to the required revision of his colon anastomosis).  He is pursuing disability assistance.  I feel that if he returned to the same type of work, which regularly requires lifting over 100 lbs, he would be at high risk for hernia recurrence.  He continues to make progress and is motivated to return to normal health, but at this time, it is difficult to determine his level of disability.  It is likely that in a few months, he may return to full-time work, but in a job that requires lifting of 50 lbs or less.    We will reevaluate him in 3 weeks to check the level of wound healing.  [name]. [name], MD, [facility] Surgery  12/04/2012 2:43 PM

## 2012-12-07 ENCOUNTER — Other Ambulatory Visit (INDEPENDENT_AMBULATORY_CARE_PROVIDER_SITE_OTHER): Payer: Self-pay | Admitting: Surgery

## 2012-12-07 ENCOUNTER — Telehealth (INDEPENDENT_AMBULATORY_CARE_PROVIDER_SITE_OTHER): Payer: Self-pay | Admitting: General Surgery

## 2012-12-07 DIAGNOSIS — K439 Ventral hernia without obstruction or gangrene: Secondary | ICD-10-CM

## 2012-12-07 NOTE — Telephone Encounter (Signed)
Olegario Messier from [facility] called to get instructions on this patients care.  After speaking with Pattricia Boss, Dr. Fatima Sanger nurse, she explained that this patient should be having [facility] come out to do a wet to dry dressing change once a week and then the family should be doing it as well on a daily basis.  I faxed this order to Olegario Messier as requested to [phone] Attn: Hinton Rao.

## 2012-12-22 ENCOUNTER — Telehealth (INDEPENDENT_AMBULATORY_CARE_PROVIDER_SITE_OTHER): Payer: Self-pay

## 2012-12-22 ENCOUNTER — Telehealth (INDEPENDENT_AMBULATORY_CARE_PROVIDER_SITE_OTHER): Payer: Self-pay | Admitting: General Surgery

## 2012-12-22 ENCOUNTER — Ambulatory Visit (INDEPENDENT_AMBULATORY_CARE_PROVIDER_SITE_OTHER): Payer: BC Managed Care – PPO | Admitting: Surgery

## 2012-12-22 ENCOUNTER — Encounter (INDEPENDENT_AMBULATORY_CARE_PROVIDER_SITE_OTHER): Payer: Self-pay | Admitting: Surgery

## 2012-12-22 VITALS — BP 144/80 | HR 84 | Temp 97.5°F | Resp 20 | Ht 76.0 in | Wt 252.4 lb

## 2012-12-22 DIAGNOSIS — Z9889 Other specified postprocedural states: Secondary | ICD-10-CM

## 2012-12-22 MED ORDER — OXYCODONE-ACETAMINOPHEN 5-325 MG PO TABS: 1.0000 | ORAL_TABLET | Freq: Four times a day (QID) | ORAL | Status: DC | PRN

## 2012-12-22 NOTE — Telephone Encounter (Signed)
A rep from patients dsiability called asking if pt's ventral hernia could have been from some of his previous surgery. I went back and read Dr. Fatima Sanger note with him indicating that he thinks it is related. He will call back with any questions.

## 2012-12-22 NOTE — Telephone Encounter (Signed)
Faxed back to [facility] to the att Olegario Messier a note stating that discontinue home health nursing. Patient may do his own dressing changes, this is Per Dr [name]. Faxed to  [phone] to [facility]

## 2012-12-22 NOTE — Progress Notes (Signed)
The patient comes in today for another wound check.  Appetite and bowel movements are normal. He still gets occasional pain lateral to his incisions. He is not having any problems with dressing changes.  Filed Vitals:   12/22/12 1040  BP: 144/80  Pulse: 84  Temp: 97.5 F (36.4 C)  Resp: 20   His wound is much smaller. There is a lot of granulation tissue in this area. We cauterized the granulation tissue with silver nitrate. The undermining that was previously present in the upper part of the incision has now filled up with granulation tissue. A couple of the sutures are loose and these are removed. He can continue with the dressing changes and the wound should heal within a few weeks. The patient should wear an abdominal binder. Tentatively he can return to  Work at full activity at the beginning of March. I gave him a letter stating this. Hopefully he will be able to get his old job back.  Recheck 3 weeks.  [name]. [name], MD, [facility] Surgery  12/22/2012 12:12 PM

## 2013-01-11 ENCOUNTER — Encounter (INDEPENDENT_AMBULATORY_CARE_PROVIDER_SITE_OTHER): Payer: Self-pay | Admitting: Surgery

## 2013-01-11 ENCOUNTER — Ambulatory Visit (INDEPENDENT_AMBULATORY_CARE_PROVIDER_SITE_OTHER): Payer: Self-pay | Admitting: Surgery

## 2013-01-11 VITALS — BP 136/90 | HR 72 | Temp 97.4°F | Resp 16 | Ht 76.0 in | Wt 251.2 lb

## 2013-01-11 DIAGNOSIS — Z9889 Other specified postprocedural states: Secondary | ICD-10-CM

## 2013-01-11 MED ORDER — OXYCODONE-ACETAMINOPHEN 5-325 MG PO TABS: 1.0000 | ORAL_TABLET | Freq: Four times a day (QID) | ORAL | Status: DC | PRN

## 2013-01-11 NOTE — Progress Notes (Signed)
Filed Vitals:   01/11/13 0858  BP: 136/90  Pulse: 72  Temp: 97.4 F (36.3 C)  Resp: 16    The patient is here for a wound check. His appetite and bowel movements are normal. He still has some residual muscle soreness in his left lower quadrant near his old ostomy site. However the skin and subcutaneous tissues are soft in this area. No sign of infection. His midline wound is getting much smaller. There is abundant granulation tissue with some persistent drainage. No sign of undermining a fistula. We cauterized the granulation tissue with silver nitrate. We'll recheck a than 3 weeks. Continue with dressing changes.  [name]. [name], MD, [facility] Surgery  01/11/2013 9:21 AM

## 2013-01-29 ENCOUNTER — Encounter (INDEPENDENT_AMBULATORY_CARE_PROVIDER_SITE_OTHER): Payer: Self-pay | Admitting: Surgery

## 2013-01-29 ENCOUNTER — Ambulatory Visit (INDEPENDENT_AMBULATORY_CARE_PROVIDER_SITE_OTHER): Payer: BC Managed Care – PPO | Admitting: Surgery

## 2013-01-29 VITALS — BP 156/92 | HR 72 | Temp 97.0°F | Resp 16 | Ht 76.0 in | Wt 255.0 lb

## 2013-01-29 DIAGNOSIS — Z9889 Other specified postprocedural states: Secondary | ICD-10-CM

## 2013-01-29 DIAGNOSIS — K439 Ventral hernia without obstruction or gangrene: Secondary | ICD-10-CM

## 2013-01-29 MED ORDER — OXYCODONE-ACETAMINOPHEN 5-325 MG PO TABS: 1.0000 | ORAL_TABLET | ORAL | Status: DC | PRN

## 2013-01-29 NOTE — Progress Notes (Signed)
The patient comes back for a followup. His abdominal incision is almost completely healed. He just has a thin strip of granulation tissue that seems to be healing nicely. He is scheduled to return to work next week. There are no limits on his activity. I did encourage him to wear an abdominal binder while at work.  Apparently the patient has been denied for disability insurance. This is mind-boggling as the patient had obvious emergent medical indications for his initial surgery in February. The subsequent hernia operation and associated complications were all secondary to his initial diverticulitis. We will reappeal his insurance claim. He may followup with [place] as needed.  [name]. [name], MD, [facility] Surgery  01/29/2013 12:10 PM

## 2013-02-13 IMAGING — RF DG COLON W/ WATER SOL CM
15 series · 15 of 15 positions shown · non-contrast
Comparison: CT abdomen pelvis of 06/16/2012

CLINICAL DATA: Recent reversal of the colostomy, possible fistula
or leak by CT

WATER SOLUBLE CONTRAST ENEMA
TECHNIQUE: Initial scout AP supine abdominal image was obtained.
Water soluble contrast was introduced into the colon in a
retrograde fashion and refluxed from the rectum to the cecum.  Spot
images of the colon followed by overhead radiographs were obtained.
Fluoroscopy time: 1.3 minutes.

[Series 1: run · 1 of 1 slices shown (1 of 10)]
[im 1/1]
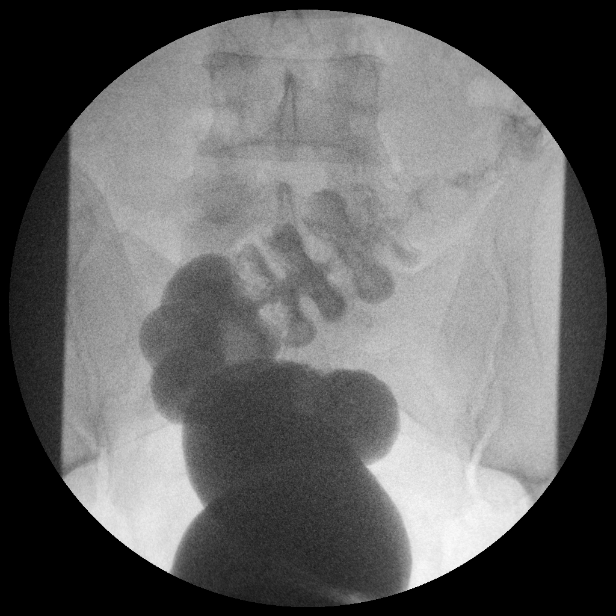

[Series 2: run · 1 of 1 slices shown (2 of 10)]
[im 1/1]
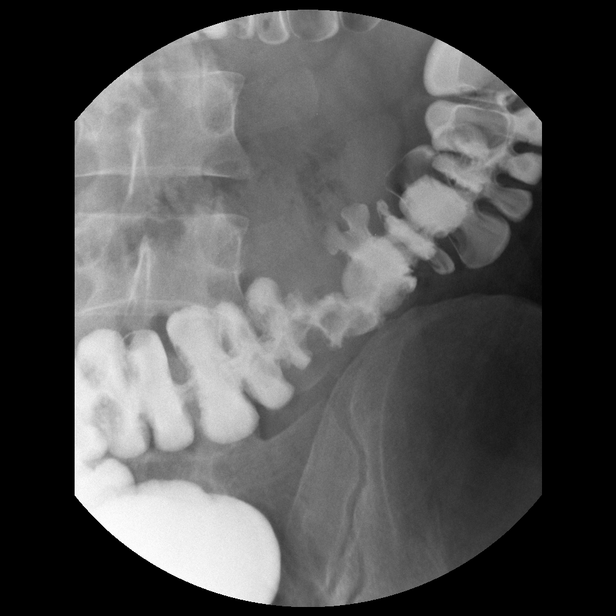

[Series 3: run · 1 of 1 slices shown (3 of 10)]
[im 1/1]
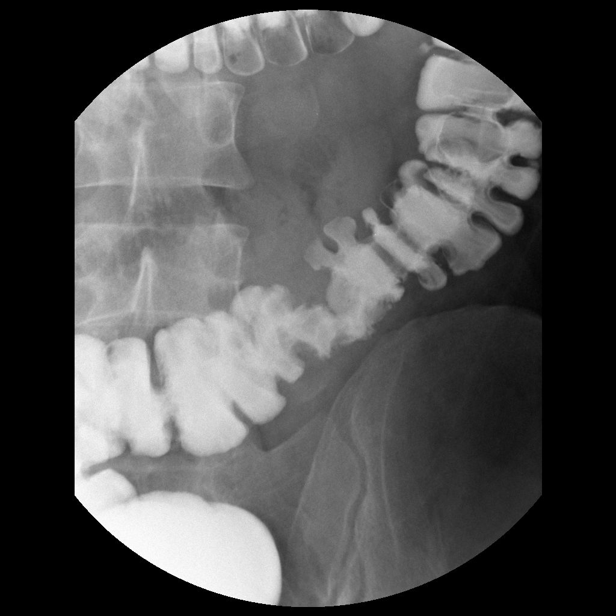

[Series 4: run · 1 of 1 slices shown (4 of 10)]
[im 1/1]
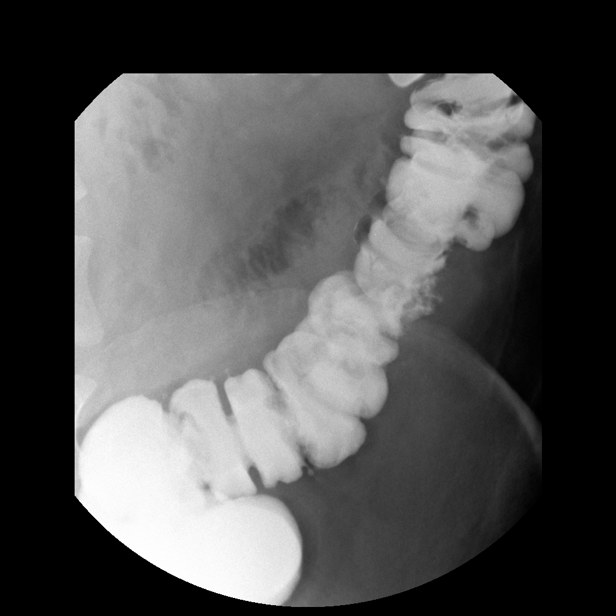

[Series 5: run · 1 of 1 slices shown (5 of 10)]
[im 1/1]
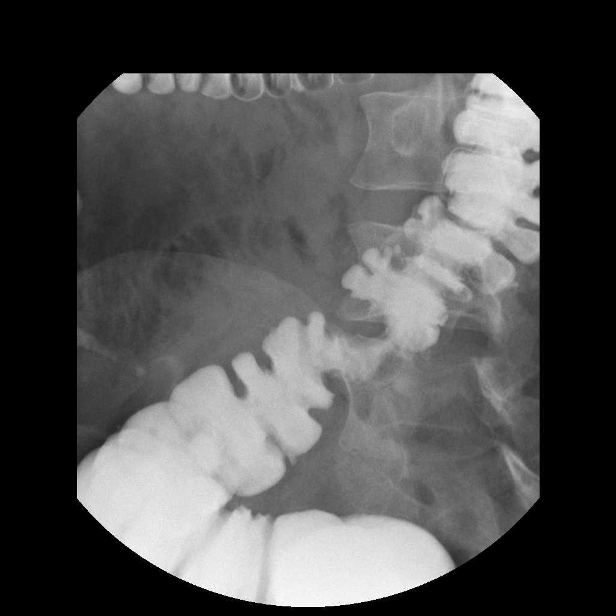

[Series 6: run · 1 of 1 slices shown (6 of 10)]
[im 1/1]
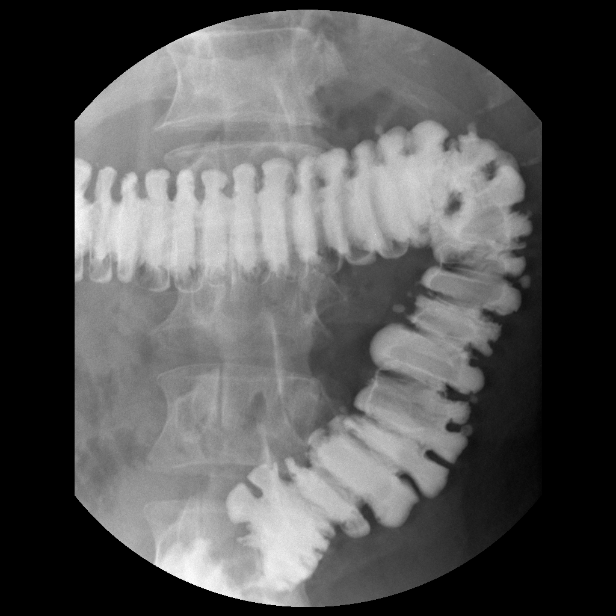

[Series 7: run · 1 of 1 slices shown (7 of 10)]
[im 1/1]
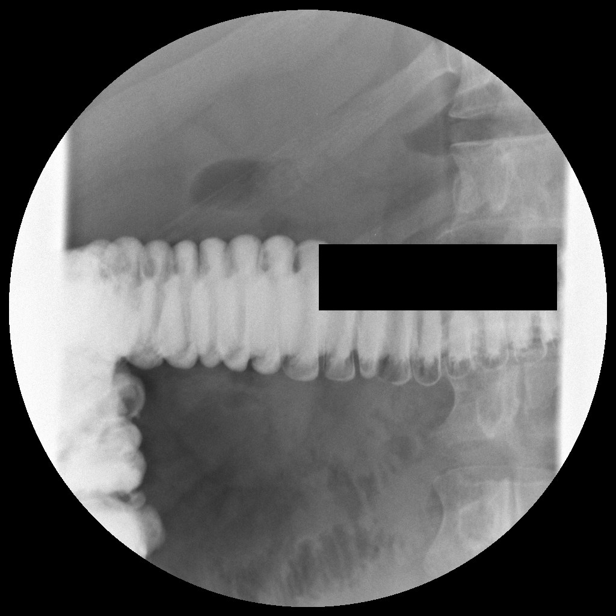

[Series 8: run · 1 of 1 slices shown (8 of 10)]
[im 1/1]
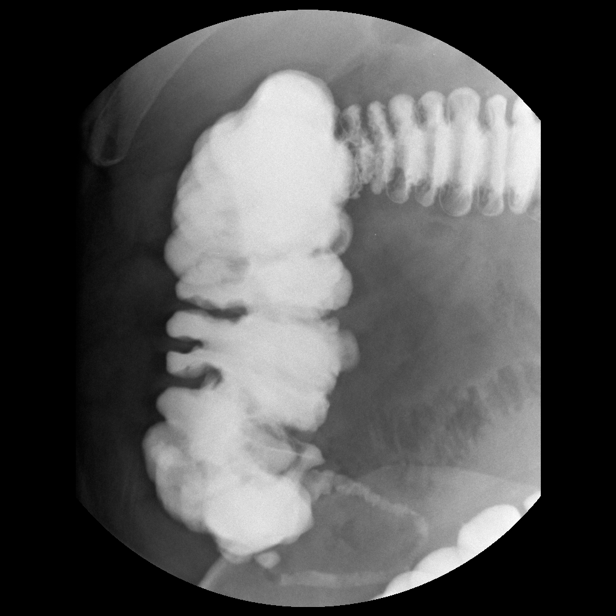

[Series 9: run · 1 of 1 slices shown (9 of 10)]
[im 1/1]
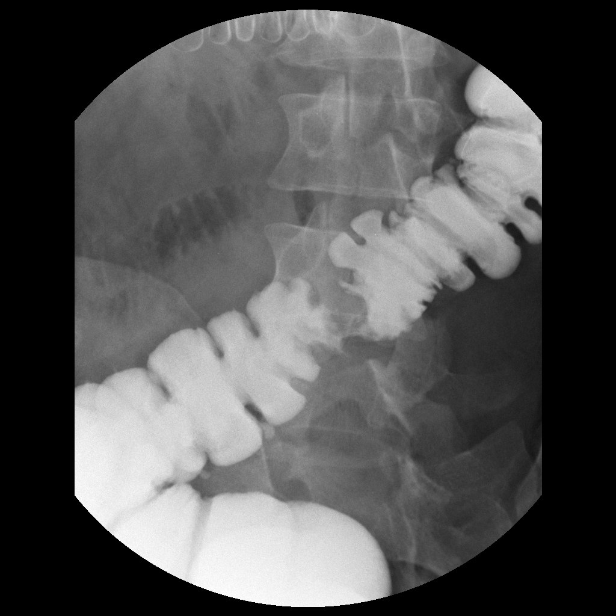

[Series 10: run · 1 of 1 slices shown (10 of 10)]
[im 1/1]
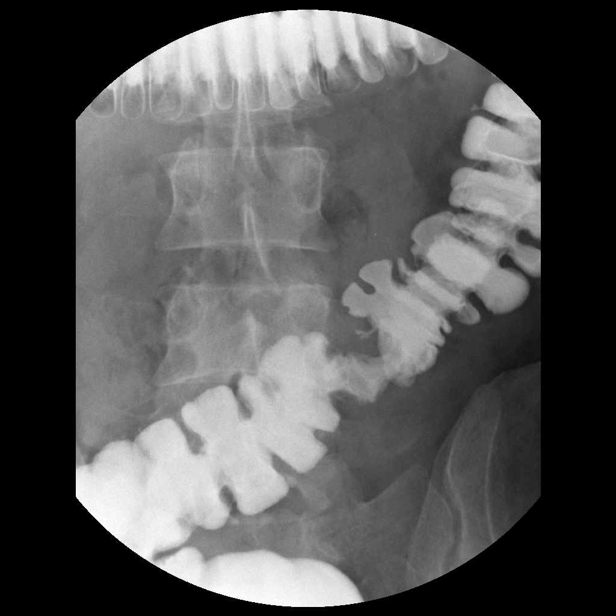

[Series 1001: view not recorded · 0.20mm/px · 1 of 1 slices shown (1 of 5)]
[im 1/1]
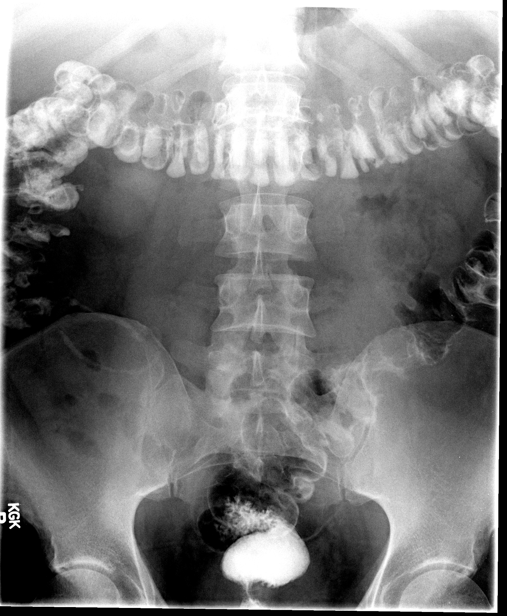

[Series 1002: view not recorded · 0.20mm/px · 1 of 1 slices shown (2 of 5)]
[im 1/1]
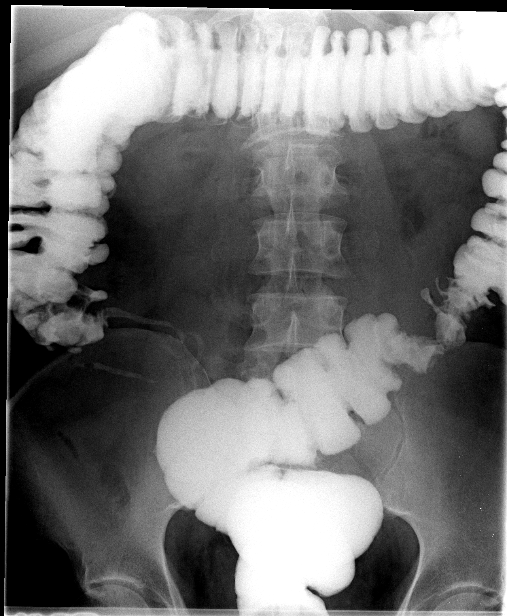

[Series 1003: view not recorded · 0.20mm/px · 1 of 1 slices shown (3 of 5)]
[im 1/1]
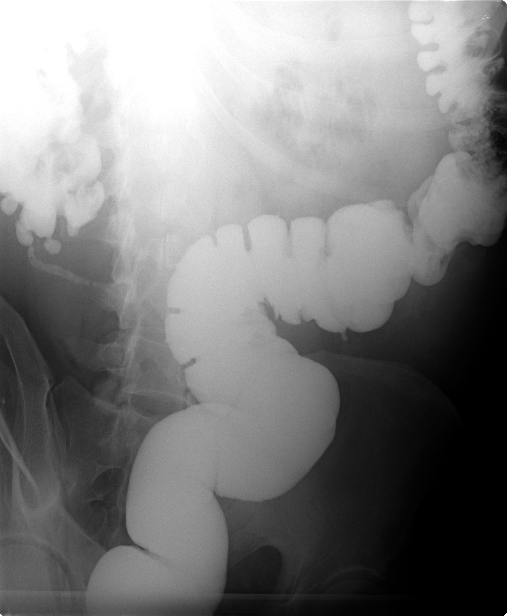

[Series 1004: view not recorded · 0.20mm/px · 1 of 1 slices shown (4 of 5)]
[im 1/1]
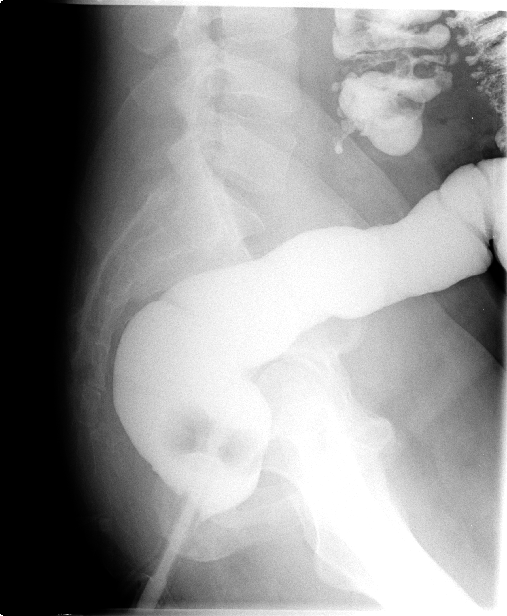

[Series 1005: view not recorded · 0.20mm/px · 1 of 1 slices shown (5 of 5)]
[im 1/1]
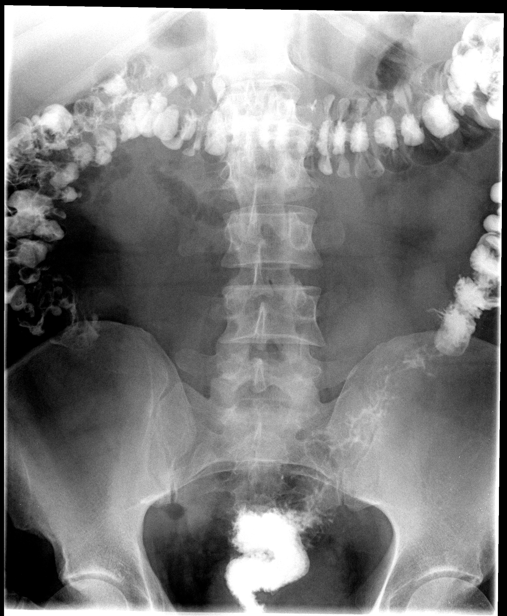

[15 of 15 positions shown; findings below may reference images not displayed]

FINDINGS: A preliminary film of the abdomen shows some residual
contrast throughout the colon from recent CT.  No bowel obstruction
is seen.

Water soluble contrast enters the colon with no obstruction or
delay.  At the site of reanastomosis, there is slight mucosal
irregularity most likely postoperative in nature.  However, no
fistulous communication is seen.  The more proximal colon is
unremarkable.  Delayed images after evacuation show no evidence of
fistula.
IMPRESSION: There is some mucosal edema at the site of the reanastomosis but no
leak or fistula is seen.

## 2013-02-24 ENCOUNTER — Ambulatory Visit (INDEPENDENT_AMBULATORY_CARE_PROVIDER_SITE_OTHER): Payer: BC Managed Care – PPO | Admitting: General Surgery

## 2013-02-24 ENCOUNTER — Encounter (INDEPENDENT_AMBULATORY_CARE_PROVIDER_SITE_OTHER): Payer: Self-pay | Admitting: General Surgery

## 2013-02-24 VITALS — BP 130/82 | HR 77 | Temp 98.0°F | Resp 20 | Ht 76.0 in | Wt 250.0 lb

## 2013-02-24 DIAGNOSIS — Z9889 Other specified postprocedural states: Secondary | ICD-10-CM

## 2013-02-24 DIAGNOSIS — T8189XA Other complications of procedures, not elsewhere classified, initial encounter: Secondary | ICD-10-CM

## 2013-02-24 DIAGNOSIS — T79A3XD Traumatic compartment syndrome of abdomen, subsequent encounter: Secondary | ICD-10-CM

## 2013-02-24 DIAGNOSIS — IMO0001 Reserved for inherently not codable concepts without codable children: Secondary | ICD-10-CM

## 2013-02-24 NOTE — Progress Notes (Signed)
Patient ID: Douglas Wiggins, male   DOB: 1966-11-03, 46 y.o.   MRN: [id number] History: This patient presents to the office to check his abdominal wound. He states that it has been swelling intermittently and tender but no drainage. He states that the open area in the center of the wound has not completely healed. He has a history of Hartman's procedure for perforated diverticulitis last year. The colostomy was reversed 3 months later. He subsequently underwent surgery for a ventral incisional hernia which involved the colon anastomosis necessitating revision and resection of the anastomosis. He developed abdominal compartment syndrome postop and had to be reexplored open abdomen and ultimately was closed. 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 His appetite is normal. His bowel function is normal.He continues to smoke  Exam: Patient is in no distress. BMI 30. 98.0. Pulse 77. BP 130/82. Abdomen: Obese. Soft and nontender laterally. In the central portion of his wound there is about a 3 cm irregular superficial area of granulation tissue. No purulence. No enteric drainage. No exposed sutures. There is some induration around this which is a little bit tender. No fluctuance. Nothing that looks like he needs to be drained or debrided today.  Assessment: Low-grade cellulitis of complex abdominal wound Nonhealing midline wound. Multifactorial. Tobacco abuse Complex surgical history with multiple operations as described above. In general has recovered and returned to full activities without any GI symptoms  Plan: Saline wet-to-dry dressings twice a day Clindamycin 600 mg every 6 hours for 7 days CT scan abdomen and pelvis to make sure we're not missing a deeper abdominal fluid collection Return to see Dr. [name] in one week.    Angelia Mould. Derrell Lolling, M.D., [facility] General and Minimally invasive Surgery Breast and Colorectal Surgery Office:    [phone] Pager:   [phone]

## 2013-02-24 NOTE — Patient Instructions (Signed)
You may have a low-grade infection of your abdominal wound. I could not identify any abscess that needed to be drained in the office today.  He have been given a prescription for clindamycin, an antibiotic, for 7 days.  Change the dressing with saline and fine-mesh gauze twice a day, as you have done in the past. Keep the wound very clean.  You will be scheduled for a CT scan of your  abdomen and pelvis to make sure that we had not miss an abscess.  Return to see Dr. [name]  next week. You will be given an appointment.

## 2013-02-26 ENCOUNTER — Ambulatory Visit
Admission: RE | Admit: 2013-02-26 | Discharge: 2013-02-26 | Disposition: A | Discharge status: Home / Self Care | Payer: BC Managed Care – PPO | Source: Ambulatory Visit | Attending: General Surgery | Admitting: General Surgery

## 2013-02-26 DIAGNOSIS — IMO0001 Reserved for inherently not codable concepts without codable children: Secondary | ICD-10-CM

## 2013-02-26 DIAGNOSIS — Z9889 Other specified postprocedural states: Secondary | ICD-10-CM

## 2013-02-26 MED ORDER — IOHEXOL 300 MG/ML  SOLN
125.0000 mL | Freq: Once | INTRAMUSCULAR | Status: AC | PRN
  Administered 2013-02-26: 125 mL via INTRAVENOUS

## 2013-03-01 ENCOUNTER — Ambulatory Visit (INDEPENDENT_AMBULATORY_CARE_PROVIDER_SITE_OTHER): Payer: BC Managed Care – PPO | Admitting: Surgery

## 2013-03-01 ENCOUNTER — Encounter (INDEPENDENT_AMBULATORY_CARE_PROVIDER_SITE_OTHER): Payer: Self-pay | Admitting: Surgery

## 2013-03-01 ENCOUNTER — Encounter (INDEPENDENT_AMBULATORY_CARE_PROVIDER_SITE_OTHER): Payer: Self-pay | Admitting: General Surgery

## 2013-03-01 VITALS — BP 130/81 | HR 76 | Temp 98.2°F | Resp 14 | Ht 76.0 in | Wt 255.8 lb

## 2013-03-01 DIAGNOSIS — T8130XA Disruption of wound, unspecified, initial encounter: Secondary | ICD-10-CM | POA: Insufficient documentation

## 2013-03-01 MED ORDER — OXYCODONE-ACETAMINOPHEN 5-325 MG PO TABS: 1.0000 | ORAL_TABLET | ORAL | Status: DC | PRN

## 2013-03-01 NOTE — Progress Notes (Signed)
The patient is here for followup after his urgent office visit last week with Dr. Derrell Lolling.  The CT scan showed no sign of abscess. He does have inflammation and scarring in his midline. His tenderness is mostly in the right lower corner of his previous incision. There are minimal areas of granulation tissue and the scar itself that seemed to be slowly healing. No drainage is noted. No erythema or induration.   Clinical Data: Abdominal pain. Abdominal wall infection.  CT ABDOMEN AND PELVIS WITH CONTRAST  Technique: Multidetector CT imaging of the abdomen and pelvis was  performed following the standard protocol during bolus  administration of intravenous contrast.  Contrast: 125 ml Omnipaque-300  Comparison: CT scan dated 06/16/2012  Findings: The scarring and/or inflammation around the previous  ostomy site in the left mid abdomen has appreciably improved.  There is increased fibrosis/ inflammatory response in the midline  extending along the anterior aspect of the rectus muscles to the  right and left of midline. There is no definable abscess. No  intra-abdominal extension of the process.  The liver, spleen, pancreas, adrenal glands, kidneys, and biliary  tree appear normal. The bowel appears normal including the  terminal ileum and appendix. No osseous abnormality.  IMPRESSION:  1. No evidence of a soft tissue abscess.  2. Increased inflammation and/or scarring in the midline at the  site of previous incision extending along the anterior aspect of  the rectus muscles.  Original Report Authenticated By: Francene Boyers, M.D.   The patient should finish his course of clindamycin. I gave him a prescription for some Percocet. He should wear an abdominal binder. Neosporin and dry gauze over the wound. Followup one week.  [name]. [name], MD, [facility] Surgery  03/01/2013 2:21 PM

## 2013-03-11 ENCOUNTER — Ambulatory Visit (INDEPENDENT_AMBULATORY_CARE_PROVIDER_SITE_OTHER): Payer: BC Managed Care – PPO | Admitting: Surgery

## 2013-03-11 ENCOUNTER — Encounter (INDEPENDENT_AMBULATORY_CARE_PROVIDER_SITE_OTHER): Payer: Self-pay | Admitting: Surgery

## 2013-03-11 VITALS — BP 130/84 | HR 78 | Temp 97.6°F | Resp 18 | Ht 76.0 in | Wt 254.4 lb

## 2013-03-11 DIAGNOSIS — Z9889 Other specified postprocedural states: Secondary | ICD-10-CM

## 2013-03-11 DIAGNOSIS — T8189XA Other complications of procedures, not elsewhere classified, initial encounter: Secondary | ICD-10-CM

## 2013-03-11 MED ORDER — OXYCODONE-ACETAMINOPHEN 5-325 MG PO TABS: 1.0000 | ORAL_TABLET | ORAL | Status: DC | PRN

## 2013-03-11 NOTE — Progress Notes (Signed)
No significant changes in the last 10 days.  He did develop some skin lesions on his chin and back that may be related to the Clindamycin.  He has completed his course of antibiotics.  The scar tissue in his midline wound appears unchanged.  He has a couple of areas of bare granulation tissue, but these are beginning to crust over.  Minimal drainage.  He is still tender in his RLQ of the large thick scar, but there is no sign of infection or fluctuance in this area.    Hopefully, this hard scar tissue will soften over time.  Continue with Neosporin and dry gauze over the midline wound.  Follow-up 3-4 weeks.  Refill Percocet.  [name]. [name], MD, [facility] Surgery  03/11/2013 5:25 PM

## 2013-04-13 ENCOUNTER — Encounter (INDEPENDENT_AMBULATORY_CARE_PROVIDER_SITE_OTHER): Payer: Self-pay | Admitting: Surgery

## 2013-04-13 ENCOUNTER — Ambulatory Visit (INDEPENDENT_AMBULATORY_CARE_PROVIDER_SITE_OTHER): Payer: BC Managed Care – PPO | Admitting: Surgery

## 2013-04-13 VITALS — BP 122/77 | HR 68 | Temp 98.0°F | Resp 16 | Ht 76.0 in | Wt 252.6 lb

## 2013-04-13 DIAGNOSIS — Z9889 Other specified postprocedural states: Secondary | ICD-10-CM

## 2013-04-13 MED ORDER — OXYCODONE-ACETAMINOPHEN 5-325 MG PO TABS: 1.0000 | ORAL_TABLET | ORAL | Status: DC | PRN

## 2013-04-13 NOTE — Progress Notes (Signed)
The patient comes in for another wound check. Appetite and bowel movements are normal. He has 3 areas of tenderness around the lower part of his incision.  The tenderness seems to be very superficial involving only the skin. The remainder of his wound seems to be healing well. There is no drainage. He has 3 small areas at the lower part of his incision. The one on the right has a whitish pocket that seems to be some localized infection. The other 2 were just mildly tender to palpation. We prepped the largest one on the right with Betadine and anesthetized with 1% lidocaine. I made a 1 cm incision over this area we expressed some old blood and some purulent fluid. The cavity is fairly superficial. We then drained the one on the left. Only a low blood old blood but no purulent fluid from the site. He'll treat both of these with Neosporin and dry dressings. Followup in 2-3 weeks.  [name]. [name], MD, [facility] Surgery  General/ Trauma Surgery  04/13/2013 4:09 PM

## 2013-05-06 ENCOUNTER — Encounter (INDEPENDENT_AMBULATORY_CARE_PROVIDER_SITE_OTHER): Payer: Self-pay | Admitting: Surgery

## 2013-05-06 ENCOUNTER — Ambulatory Visit (INDEPENDENT_AMBULATORY_CARE_PROVIDER_SITE_OTHER): Payer: BC Managed Care – PPO | Admitting: Surgery

## 2013-05-06 VITALS — BP 148/88 | HR 77 | Temp 97.3°F | Resp 16 | Ht 76.0 in | Wt 259.2 lb

## 2013-05-06 DIAGNOSIS — Z9889 Other specified postprocedural states: Secondary | ICD-10-CM

## 2013-05-06 MED ORDER — OXYCODONE-ACETAMINOPHEN 5-325 MG PO TABS: 1.0000 | ORAL_TABLET | ORAL | Status: DC | PRN

## 2013-05-06 NOTE — Progress Notes (Signed)
The patient's wound finally seems to be completely healed. The areas of tenderness in the low part of the incision have resolved. The remainder of his wound is contracting and healing well. About 4 cm below his midline wound at the belt line there is a small tender palpable area of inflammation. We prepped this with chlor prep and anesthetized with 1% lidocaine. I opened the wound and a small amount of purulent fluid was expressed. He will treat this with Neosporin. He may follow up with [place] as needed.   Douglas Wiggins. [name], MD, [facility] Surgery  General/ Trauma Surgery  05/06/2013 5:08 PM

## 2013-08-26 ENCOUNTER — Encounter (INDEPENDENT_AMBULATORY_CARE_PROVIDER_SITE_OTHER): Payer: Self-pay | Admitting: Surgery

## 2013-08-26 ENCOUNTER — Ambulatory Visit (INDEPENDENT_AMBULATORY_CARE_PROVIDER_SITE_OTHER): Payer: BC Managed Care – PPO | Admitting: Surgery

## 2013-08-26 VITALS — BP 140/81 | HR 66 | Temp 98.6°F | Resp 14 | Ht 76.0 in | Wt 265.6 lb

## 2013-08-26 DIAGNOSIS — K432 Incisional hernia without obstruction or gangrene: Secondary | ICD-10-CM

## 2013-08-26 MED ORDER — OXYCODONE-ACETAMINOPHEN 5-325 MG PO TABS: 1.0000 | ORAL_TABLET | ORAL | Status: DC | PRN

## 2013-08-26 NOTE — Progress Notes (Signed)
The patient is status post exploratory laparotomy, partial colon resection, and suture repair of a ventral hernia November 2013. This was complicated by abdominal compartment syndrome that required decompression and open abdomen for several days.  His abdominal wall was closed on 10/29/12 with relaxing incisions and closure of the midline fascia with interrupted figure-of-eight #1 Novafil sutures. The patient has been having increasing abdominal pain mostly at the upper end of his incision. He has developed 2 bulges in this area. These remain reducible. The patient has lost his job since he is unable to lift heavy objects. His appetite and bowel movements remain normal. He is not having any pain when he is relaxed and supine.  His left lower quadrant colostomy site is well-healed with no sign of hernia. The patient has a wide thick scar and his midline where his wound healed by secondary intention. There is some crusting in the middle of this incision. No drainage noted. He has 2 visible palpable ventral hernias above his midline scar. These remain soft and reducible. These enlarged with a scalpel maneuver. I cannot palpate any other ventral hernias.  We will check a CT scan to determine the size, location, and number of ventral hernia defect. At the time of his next surgery, I would like to excise his old scar. Hopefully, this time we will be able to use mesh to reinforce his abdominal wall.  We will evaluate him again after his CT scan to discuss surgical repair.  Douglas Wiggins. [name], MD, [facility]  General/ Trauma Surgery  08/26/2013 3:33 PM

## 2013-08-31 ENCOUNTER — Ambulatory Visit
Admission: RE | Admit: 2013-08-31 | Discharge: 2013-08-31 | Disposition: A | Discharge status: Home / Self Care | Payer: BC Managed Care – PPO | Source: Ambulatory Visit | Attending: Surgery | Admitting: Surgery

## 2013-08-31 DIAGNOSIS — K432 Incisional hernia without obstruction or gangrene: Secondary | ICD-10-CM

## 2013-08-31 MED ORDER — IOHEXOL 300 MG/ML  SOLN
125.0000 mL | Freq: Once | INTRAMUSCULAR | Status: AC | PRN
  Administered 2013-08-31: 125 mL via INTRAVENOUS

## 2013-09-14 ENCOUNTER — Telehealth (INDEPENDENT_AMBULATORY_CARE_PROVIDER_SITE_OTHER): Payer: Self-pay | Admitting: Surgery

## 2013-09-14 ENCOUNTER — Ambulatory Visit (INDEPENDENT_AMBULATORY_CARE_PROVIDER_SITE_OTHER): Payer: BC Managed Care – PPO | Admitting: Surgery

## 2013-09-14 ENCOUNTER — Encounter (INDEPENDENT_AMBULATORY_CARE_PROVIDER_SITE_OTHER): Payer: Self-pay | Admitting: Surgery

## 2013-09-14 VITALS — BP 136/90 | HR 78 | Temp 97.8°F | Resp 16 | Ht 76.0 in | Wt 265.8 lb

## 2013-09-14 DIAGNOSIS — K439 Ventral hernia without obstruction or gangrene: Secondary | ICD-10-CM

## 2013-09-14 NOTE — Progress Notes (Signed)
The patient returns for discussion of his CT scan. He has 2 small anterior abdominal wall hernias just above the upper end of his midline scar. These correspond with the visible, palpable hernias on physical examination. He also has a hernia below his old colostomy site. He has a significant amount of scarring in his abdominal wall but no other hernias. I would recommend laparoscopic ventral hernia repair with mesh. A minimally invasive approach would hopefully provide him with a shorter recovery time and less pain.  Ct Abdomen Pelvis W Contrast  08/31/2013   CLINICAL DATA:  Subsequent encounter for ventral abdominal wall hernia. Prior ventral hernia repair in November, 2013. Patient presents now with greater than 2 month history of mid abdominal pain. Surgical history also includes prior resection of sigmoid colon due to diverticulitis with colostomy and reanastomosis.  EXAM: CT ABDOMEN AND PELVIS WITH CONTRAST  TECHNIQUE: Multidetector CT imaging of the abdomen and pelvis was performed using the standard protocol following bolus administration of intravenous contrast.  CONTRAST:  OMNIPAQUE IOHEXOL 300 MG/ML IV. Oral contrast was also administered.  COMPARISON:  02/26/2013, 06/06/2012, 01/05/2012.  FINDINGS: Adjacent small anterior abdominal wall hernias at the upper end of the prior surgical scar. The adjacent hernias are in the midline and just to the right of midline and contain normal appearing small bowel. Bowel within the anterior abdominal wall defects is a new finding since the most recent prior CT. A hernia situated between the left rectus abdominus muscle and the left oblique abdominal muscles contains fat, but the defect has increased in size since the prior CT, now approximating 4 cm (previously 2 cm); the sigmoid colon just distal to the anastomosis is in close proximity to this defect, but is not within the defect currently.  Stomach normal in appearance. Circumferential thickening of the  wall of the visualized distal esophagus, unchanged. Small bowel normal in appearance. Widely patent colocolic anastomosis. Apparent wall thickening involving the ascending and transverse colon is similar to prior and likely due to incomplete distention. Normal appearing long appendix in the right upper pelvis. No ascites.  Diffuse hepatic steatosis without focal hepatic parenchymal abnormality, unchanged. Liver upper normal in size. Normal-appearing spleen, pancreas, adrenal glands, and kidneys; persistent fetal lobulation accounts for the lobular contour of both kidneys. Gallbladder unremarkable by CT. No biliary ductal dilation. Mild left common iliac artery atherosclerosis, unchanged. No significant lymphadenopathy.  Urinary bladder decompressed and unremarkable. Prostate gland and seminal vesicles normal for age. Small bilateral inguinal hernias containing fat, unchanged.  Bone window images demonstrate lower thoracic spondylosis, degenerative disk disease and spondylosis at L1-2, and degenerative changes in the right sacroiliac joint. Visualized lung bases clear. Heart size upper normal.  IMPRESSION: 1. Adjacent very small anterior abdominal wall hernias in the midline and just to the right of midline, located just above the upper end of the midline abdominal surgical scar. Each of these very small hernias contains normal appearing small bowel. 2. Abdominal wall hernia situated between the left rectus muscle on the left oblique muscles (Spigelian hernia), containing fat. This hernia has increased in size since March, 2014. The sigmoid colon just distal to the anastomosis is in close proximity to this hernia but is not within the hernia. 3. Widely patent colocolic anastomosis at the level of the distal descending and sigmoid colon. 4. Diffuse hepatic steatosis without focal hepatic parenchymal abnormality. 5. Circumferential wall thickening of the visualized distal esophagus without evidence of hiatal hernia.  Query GE reflux. 6. Mild left common  iliac artery atherosclerosis, advanced for age. 7. Small bilateral inguinal hernias containing fat, unchanged.   Electronically Signed   By: Hulan Saas   On: 08/31/2013 15:00   Filed Vitals:   09/14/13 1329  BP: 136/90  Pulse: 78  Temp: 97.8 F (36.6 C)  Resp: 16   Impression: Multiple ventral hernias Plan: We will attempt laparoscopic ventral hernia repair with mesh with the possibility of conversion to an open procedure.The surgical procedure has been discussed with the patient.  Potential risks, benefits, alternative treatments, and expected outcomes have been explained.  All of the patient's questions at this time have been answered.  The likelihood of reaching the patient's treatment goal is good.  The patient understand the proposed surgical procedure and wishes to proceed. [name]. [name], MD, [facility]  General/ Trauma Surgery  09/14/2013 1:47 PM

## 2013-09-14 NOTE — Telephone Encounter (Signed)
Went over pt financial responsibilities he will call when has deposit. Placed in pending folder.

## 2013-09-24 ENCOUNTER — Telehealth (INDEPENDENT_AMBULATORY_CARE_PROVIDER_SITE_OTHER): Payer: Self-pay | Admitting: *Deleted

## 2013-09-24 ENCOUNTER — Telehealth (INDEPENDENT_AMBULATORY_CARE_PROVIDER_SITE_OTHER): Payer: Self-pay | Admitting: General Surgery

## 2013-09-24 ENCOUNTER — Other Ambulatory Visit (INDEPENDENT_AMBULATORY_CARE_PROVIDER_SITE_OTHER): Payer: Self-pay | Admitting: Surgery

## 2013-09-24 MED ORDER — OXYCODONE-ACETAMINOPHEN 5-325 MG PO TABS: 1.0000 | ORAL_TABLET | ORAL | Status: DC | PRN

## 2013-09-24 NOTE — Telephone Encounter (Signed)
Patient's mother called to ask for a refill of pain medication.  Patient was last seen by Dr. [name] on 09/14/13.  Mother states patient's pain is in the abdominal area where he had surgery.  Mother denies patient has fever, signs of infection.  Patient is eating and having normal BMs.  Mother does state that patient becomes nauseated after eating but then it subsides.  Patient received Percocet #40 on 08/26/13.  Explained that I would send a message to Dr. [name] to ask if this would be appropriate then we can let her know.  Mother states understanding and agreeable at this time.

## 2013-09-24 NOTE — Telephone Encounter (Signed)
Called and spoke to patient mother and told her that we have a written Rx for Percocet 5/325 1 tab by mouth 4 hours as needed for pain

## 2013-10-19 ENCOUNTER — Other Ambulatory Visit (INDEPENDENT_AMBULATORY_CARE_PROVIDER_SITE_OTHER): Payer: Self-pay | Admitting: Surgery

## 2013-10-19 ENCOUNTER — Telehealth (INDEPENDENT_AMBULATORY_CARE_PROVIDER_SITE_OTHER): Payer: Self-pay | Admitting: General Surgery

## 2013-10-19 ENCOUNTER — Telehealth (INDEPENDENT_AMBULATORY_CARE_PROVIDER_SITE_OTHER): Payer: Self-pay

## 2013-10-19 ENCOUNTER — Encounter (INDEPENDENT_AMBULATORY_CARE_PROVIDER_SITE_OTHER): Payer: Self-pay | Admitting: Surgery

## 2013-10-19 MED ORDER — OXYCODONE-ACETAMINOPHEN 5-325 MG PO TABS: 1.0000 | ORAL_TABLET | ORAL | Status: DC | PRN

## 2013-10-19 NOTE — Telephone Encounter (Signed)
Patient mother called and asked for a refill on her son pain med. Dr [name] wrote Rx for Percocet 5/325 1 tab every 4 hours with no refill. The patient was in the lobby and I gave it to him in hand and put the orders in the his med list, the patient is also stated to me that he pay on his surgery down payment

## 2013-10-19 NOTE — Telephone Encounter (Signed)
Mother called stating patient needs refill on his Norco. Please advise

## 2013-11-04 ENCOUNTER — Other Ambulatory Visit (HOSPITAL_COMMUNITY): Payer: BC Managed Care – PPO

## 2013-11-09 ENCOUNTER — Encounter (HOSPITAL_COMMUNITY)
Admission: RE | Admit: 2013-11-09 | Discharge: 2013-11-09 | Disposition: A | Discharge status: Home / Self Care | Payer: BC Managed Care – PPO | Source: Ambulatory Visit | Attending: Surgery | Admitting: Surgery

## 2013-11-09 ENCOUNTER — Other Ambulatory Visit (HOSPITAL_COMMUNITY): Payer: Self-pay | Admitting: *Deleted

## 2013-11-09 ENCOUNTER — Encounter (HOSPITAL_COMMUNITY): Payer: Self-pay | Admitting: Pharmacy Technician

## 2013-11-09 ENCOUNTER — Encounter (HOSPITAL_COMMUNITY): Payer: Self-pay

## 2013-11-09 HISTORY — DX: Lichen planus, unspecified: L43.9

## 2013-11-09 LAB — COMPREHENSIVE METABOLIC PANEL
Alkaline Phosphatase: 83 U/L (ref 39–117)
BUN: 18 mg/dL (ref 6–23)
Calcium: 9.6 mg/dL (ref 8.4–10.5)
Chloride: 98 mEq/L (ref 96–112)
GFR calc Af Amer: >90 mL/min (ref >90)
GFR calc non Af Amer: 89 mL/min — ABNORMAL LOW (ref >90)
Glucose, Bld: 77 mg/dL (ref 70–99)
Potassium: 4.4 mEq/L (ref 3.5–5.1)
Total Protein: 7.6 g/dL (ref 6.0–8.3)

## 2013-11-09 LAB — CBC
HCT: 47.7 % (ref 39.0–52.0)
Hemoglobin: 16.9 g/dL (ref 13.0–17.0)
Platelets: 204 10*3/uL (ref 150–400)
RDW: 13.5 % (ref 11.5–15.5)
WBC: 7.8 10*3/uL (ref 4.0–10.5)

## 2013-11-09 MED ORDER — CHLORHEXIDINE GLUCONATE 4 % EX LIQD: 1.0000 "application " | Freq: Once | CUTANEOUS | Status: DC

## 2013-11-09 MED ORDER — DEXTROSE 5 % IV SOLN
3.0000 g | INTRAVENOUS | Status: AC
  Administered 2013-11-10: 3 g via INTRAVENOUS
  Filled 2013-11-09: qty 3000

## 2013-11-09 NOTE — Progress Notes (Signed)
Pt concerned that he was told that he would be admitted after surgery, but according to the OR schedule, he is an ambulatory patient. I called and spoke with Judeth Cornfield at Dr. Fatima Sanger office and she checked and stated that, yes, he was to be admitted and that she had mistakenly scheduled him as ambulatory. She states she will change that immediately. I informed pt's dad of this. Pt was having an EKG at the time.

## 2013-11-09 NOTE — Pre-Procedure Instructions (Signed)
Douglas Wiggins  11/09/2013   Your procedure is scheduled on:  Wednesday, November 10, 2013 at 8:30 AM.   Report to [name] Entrance "A" Admitting Office at 6:30 AM.   Call this number if you have problems the morning of surgery: [phone]   Remember:   Do not eat food or drink liquids after midnight tonight, 11/09/13.   Take these medicines the morning of surgery with A SIP OF WATER: escitalopram (LEXAPRO), levothyroxine (SYNTHROID, LEVOTHROID)      Do not wear jewelry, make-up or nail polish.  Do not wear lotions, powders, or perfumes. You may wear deodorant.  Do not shave 48 hours prior to surgery. Men may shave face and neck.  Do not bring valuables to the hospital.  [facility] is not responsible                  for any belongings or valuables.               Contacts, dentures or bridgework may not be worn into surgery.  Leave suitcase in the car. After surgery it may be brought to your room.  For patients admitted to the hospital, discharge time is determined by your                treatment team.               Patients discharged the day of surgery will not be allowed to drive  home.  Name and phone number of your driver: Family/friend   Special Instructions: Shower using CHG 2 nights before surgery and the night before surgery.  If you shower the day of surgery use CHG.  Use special wash - you have one bottle of CHG for all showers.  You should use approximately 1/3 of the bottle for each shower.   Please read over the following fact sheets that you were given: Pain Booklet, Coughing and Deep Breathing and Surgical Site Infection Prevention

## 2013-11-10 ENCOUNTER — Inpatient Hospital Stay (HOSPITAL_COMMUNITY)
Admission: RE | Admit: 2013-11-10 | Discharge: 2013-11-13 | DRG: 337 | Disposition: A | Discharge status: Home / Self Care | Payer: BC Managed Care – PPO | Source: Ambulatory Visit | Attending: Surgery | Admitting: Surgery

## 2013-11-10 ENCOUNTER — Encounter (HOSPITAL_COMMUNITY): Payer: Self-pay | Admitting: Surgery

## 2013-11-10 ENCOUNTER — Encounter (HOSPITAL_COMMUNITY)
Admission: RE | Disposition: A | Discharge status: Home / Self Care | Payer: Self-pay | Source: Ambulatory Visit | Attending: Surgery

## 2013-11-10 ENCOUNTER — Encounter (HOSPITAL_COMMUNITY): Payer: BC Managed Care – PPO | Admitting: Certified Registered Nurse Anesthetist

## 2013-11-10 ENCOUNTER — Ambulatory Visit (HOSPITAL_COMMUNITY): Payer: BC Managed Care – PPO | Admitting: Certified Registered Nurse Anesthetist

## 2013-11-10 DIAGNOSIS — Z79899 Other long term (current) drug therapy: Secondary | ICD-10-CM

## 2013-11-10 DIAGNOSIS — K660 Peritoneal adhesions (postprocedural) (postinfection): Secondary | ICD-10-CM | POA: Diagnosis present

## 2013-11-10 DIAGNOSIS — K432 Incisional hernia without obstruction or gangrene: Principal | ICD-10-CM | POA: Diagnosis present

## 2013-11-10 DIAGNOSIS — I10 Essential (primary) hypertension: Secondary | ICD-10-CM | POA: Diagnosis present

## 2013-11-10 DIAGNOSIS — K439 Ventral hernia without obstruction or gangrene: Secondary | ICD-10-CM | POA: Diagnosis present

## 2013-11-10 DIAGNOSIS — Z01818 Encounter for other preprocedural examination: Secondary | ICD-10-CM

## 2013-11-10 DIAGNOSIS — Z882 Allergy status to sulfonamides status: Secondary | ICD-10-CM

## 2013-11-10 DIAGNOSIS — Z01810 Encounter for preprocedural cardiovascular examination: Secondary | ICD-10-CM

## 2013-11-10 DIAGNOSIS — F329 Major depressive disorder, single episode, unspecified: Secondary | ICD-10-CM | POA: Diagnosis present

## 2013-11-10 DIAGNOSIS — F3289 Other specified depressive episodes: Secondary | ICD-10-CM | POA: Diagnosis present

## 2013-11-10 DIAGNOSIS — G4730 Sleep apnea, unspecified: Secondary | ICD-10-CM | POA: Diagnosis present

## 2013-11-10 DIAGNOSIS — Z01812 Encounter for preprocedural laboratory examination: Secondary | ICD-10-CM

## 2013-11-10 DIAGNOSIS — E039 Hypothyroidism, unspecified: Secondary | ICD-10-CM | POA: Diagnosis present

## 2013-11-10 HISTORY — DX: Other chronic pain: G89.29

## 2013-11-10 HISTORY — DX: Low back pain, unspecified: M54.50

## 2013-11-10 HISTORY — DX: Low back pain: M54.5

## 2013-11-10 HISTORY — PX: VENTRAL HERNIA REPAIR: SHX424

## 2013-11-10 HISTORY — DX: Migraine, unspecified, not intractable, without status migrainosus: G43.909

## 2013-11-10 HISTORY — PX: INSERTION OF MESH: SHX5868

## 2013-11-10 HISTORY — DX: Gout, unspecified: M10.9

## 2013-11-10 LAB — CBC
HCT: 48 % (ref 39.0–52.0)
Hemoglobin: 16.9 g/dL (ref 13.0–17.0)
MCHC: 35.2 g/dL (ref 30.0–36.0)
MCV: 86.6 fL (ref 78.0–100.0)
RBC: 5.54 MIL/uL (ref 4.22–5.81)
WBC: 12.8 10*3/uL — ABNORMAL HIGH (ref 4.0–10.5)

## 2013-11-10 LAB — CREATININE, SERUM
GFR calc Af Amer: >90 mL/min (ref >90)
GFR calc non Af Amer: >90 mL/min (ref >90)

## 2013-11-10 SURGERY — REPAIR, HERNIA, VENTRAL, LAPAROSCOPIC
Anesthesia: General

## 2013-11-10 MED ORDER — ONDANSETRON HCL 4 MG/2ML IJ SOLN
INTRAMUSCULAR | Status: DC | PRN
  Administered 2013-11-10: 4 mg via INTRAVENOUS

## 2013-11-10 MED ORDER — DEXAMETHASONE SODIUM PHOSPHATE 10 MG/ML IJ SOLN
INTRAMUSCULAR | Status: DC | PRN
  Administered 2013-11-10: 8 mg via INTRAVENOUS

## 2013-11-10 MED ORDER — HYDROMORPHONE 0.3 MG/ML IV SOLN
INTRAVENOUS | Status: DC
  Administered 2013-11-10: 23:00:00 via INTRAVENOUS
  Administered 2013-11-10: 3 mg via INTRAVENOUS
  Administered 2013-11-10 – 2013-11-11 (×2): via INTRAVENOUS
  Administered 2013-11-11: 2.7 mg via INTRAVENOUS
  Administered 2013-11-11: 2.1 mg via INTRAVENOUS
  Administered 2013-11-11: 1.45 mg via INTRAVENOUS
  Administered 2013-11-11: 3 mg via INTRAVENOUS
  Administered 2013-11-11: 2.1 mg via INTRAVENOUS
  Administered 2013-11-12: 0.3 mg via INTRAVENOUS
  Administered 2013-11-12: 1.5 mg via INTRAVENOUS
  Administered 2013-11-12: 08:00:00 via INTRAVENOUS
  Filled 2013-11-10 (×3): qty 25

## 2013-11-10 MED ORDER — HYDROMORPHONE 0.3 MG/ML IV SOLN
INTRAVENOUS | Status: AC
  Filled 2013-11-10: qty 25

## 2013-11-10 MED ORDER — LACTATED RINGERS IV SOLN
INTRAVENOUS | Status: DC | PRN
  Administered 2013-11-10 (×3): via INTRAVENOUS

## 2013-11-10 MED ORDER — HYDROMORPHONE HCL PF 1 MG/ML IJ SOLN
INTRAMUSCULAR | Status: DC | PRN
  Administered 2013-11-10 (×2): .25 mg via INTRAVENOUS

## 2013-11-10 MED ORDER — FENTANYL CITRATE 0.05 MG/ML IJ SOLN
INTRAMUSCULAR | Status: DC | PRN
  Administered 2013-11-10: 150 ug via INTRAVENOUS
  Administered 2013-11-10 (×7): 50 ug via INTRAVENOUS

## 2013-11-10 MED ORDER — VECURONIUM BROMIDE 10 MG IV SOLR
INTRAVENOUS | Status: DC | PRN
  Administered 2013-11-10: 2 mg via INTRAVENOUS
  Administered 2013-11-10: 4 mg via INTRAVENOUS
  Administered 2013-11-10 (×3): 2 mg via INTRAVENOUS

## 2013-11-10 MED ORDER — BUPIVACAINE-EPINEPHRINE 0.25% -1:200000 IJ SOLN
INTRAMUSCULAR | Status: DC | PRN
  Administered 2013-11-10: 20 mL

## 2013-11-10 MED ORDER — GLYCOPYRROLATE 0.2 MG/ML IJ SOLN
INTRAMUSCULAR | Status: DC | PRN
  Administered 2013-11-10: .6 mg via INTRAVENOUS
  Administered 2013-11-10: 0.4 mg via INTRAVENOUS

## 2013-11-10 MED ORDER — HYDROMORPHONE HCL PF 1 MG/ML IJ SOLN
0.2500 mg | INTRAMUSCULAR | Status: DC | PRN
  Administered 2013-11-10 (×2): 0.5 mg via INTRAVENOUS

## 2013-11-10 MED ORDER — NEOSTIGMINE METHYLSULFATE 1 MG/ML IJ SOLN
INTRAMUSCULAR | Status: DC | PRN
  Administered 2013-11-10: 4 mg via INTRAVENOUS

## 2013-11-10 MED ORDER — KCL IN DEXTROSE-NACL 20-5-0.45 MEQ/L-%-% IV SOLN
INTRAVENOUS | Status: DC
  Administered 2013-11-10 – 2013-11-11 (×3): via INTRAVENOUS
  Filled 2013-11-10 (×7): qty 1000

## 2013-11-10 MED ORDER — ONDANSETRON HCL 4 MG/2ML IJ SOLN: 4.0000 mg | Freq: Once | INTRAMUSCULAR | Status: DC | PRN

## 2013-11-10 MED ORDER — BUPIVACAINE-EPINEPHRINE (PF) 0.25% -1:200000 IJ SOLN
INTRAMUSCULAR | Status: AC
  Filled 2013-11-10: qty 30

## 2013-11-10 MED ORDER — CEFAZOLIN SODIUM 1-5 GM-% IV SOLN
1.0000 g | Freq: Four times a day (QID) | INTRAVENOUS | Status: AC
  Administered 2013-11-10 – 2013-11-11 (×3): 1 g via INTRAVENOUS
  Filled 2013-11-10 (×4): qty 50

## 2013-11-10 MED ORDER — PROPOFOL 10 MG/ML IV BOLUS
INTRAVENOUS | Status: DC | PRN
  Administered 2013-11-10 (×2): 200 mg via INTRAVENOUS

## 2013-11-10 MED ORDER — ONDANSETRON HCL 4 MG/2ML IJ SOLN
4.0000 mg | Freq: Four times a day (QID) | INTRAMUSCULAR | Status: DC | PRN
  Administered 2013-11-10: 4 mg via INTRAVENOUS
  Filled 2013-11-10 (×2): qty 2

## 2013-11-10 MED ORDER — ZOLPIDEM TARTRATE 5 MG PO TABS
5.0000 mg | ORAL_TABLET | Freq: Every evening | ORAL | Status: DC | PRN
  Administered 2013-11-10: 5 mg via ORAL
  Filled 2013-11-10: qty 1

## 2013-11-10 MED ORDER — ONDANSETRON HCL 4 MG PO TABS: 4.0000 mg | ORAL_TABLET | Freq: Four times a day (QID) | ORAL | Status: DC | PRN

## 2013-11-10 MED ORDER — HYDROMORPHONE HCL PF 1 MG/ML IJ SOLN
INTRAMUSCULAR | Status: AC
  Filled 2013-11-10: qty 1

## 2013-11-10 MED ORDER — NALOXONE HCL 0.4 MG/ML IJ SOLN
0.4000 mg | INTRAMUSCULAR | Status: DC | PRN
  Filled 2013-11-10: qty 1

## 2013-11-10 MED ORDER — LOSARTAN POTASSIUM 50 MG PO TABS
100.0000 mg | ORAL_TABLET | Freq: Every day | ORAL | Status: DC
  Administered 2013-11-10 – 2013-11-13 (×4): 100 mg via ORAL
  Filled 2013-11-10 (×8): qty 2

## 2013-11-10 MED ORDER — ONDANSETRON HCL 4 MG/2ML IJ SOLN: 4.0000 mg | Freq: Four times a day (QID) | INTRAMUSCULAR | Status: DC | PRN

## 2013-11-10 MED ORDER — 0.9 % SODIUM CHLORIDE (POUR BTL) OPTIME
TOPICAL | Status: DC | PRN
  Administered 2013-11-10: 1000 mL

## 2013-11-10 MED ORDER — SODIUM CHLORIDE 0.9 % IJ SOLN: 9.0000 mL | INTRAMUSCULAR | Status: DC | PRN

## 2013-11-10 MED ORDER — ARTIFICIAL TEARS OP OINT
TOPICAL_OINTMENT | OPHTHALMIC | Status: DC | PRN
  Administered 2013-11-10: 1 via OPHTHALMIC

## 2013-11-10 MED ORDER — PHENYLEPHRINE HCL 10 MG/ML IJ SOLN
INTRAMUSCULAR | Status: DC | PRN
  Administered 2013-11-10 (×2): 80 ug via INTRAVENOUS

## 2013-11-10 MED ORDER — DIPHENHYDRAMINE HCL 50 MG/ML IJ SOLN
12.5000 mg | Freq: Four times a day (QID) | INTRAMUSCULAR | Status: DC | PRN
  Filled 2013-11-10: qty 0.25

## 2013-11-10 MED ORDER — ROCURONIUM BROMIDE 100 MG/10ML IV SOLN
INTRAVENOUS | Status: DC | PRN
  Administered 2013-11-10: 50 mg via INTRAVENOUS

## 2013-11-10 MED ORDER — ENOXAPARIN SODIUM 40 MG/0.4ML ~~LOC~~ SOLN
40.0000 mg | SUBCUTANEOUS | Status: DC
  Administered 2013-11-11 – 2013-11-13 (×3): 40 mg via SUBCUTANEOUS
  Filled 2013-11-10 (×5): qty 0.4

## 2013-11-10 MED ORDER — SODIUM CHLORIDE 0.9 % IR SOLN
Status: DC | PRN
  Administered 2013-11-10: 1000 mL

## 2013-11-10 MED ORDER — LEVOTHYROXINE SODIUM 100 MCG PO TABS
100.0000 ug | ORAL_TABLET | Freq: Every day | ORAL | Status: DC
  Administered 2013-11-11 – 2013-11-13 (×3): 100 ug via ORAL
  Filled 2013-11-10 (×5): qty 1

## 2013-11-10 MED ORDER — ESCITALOPRAM OXALATE 10 MG PO TABS
10.0000 mg | ORAL_TABLET | Freq: Every day | ORAL | Status: DC
  Administered 2013-11-11 – 2013-11-13 (×3): 10 mg via ORAL
  Filled 2013-11-10 (×7): qty 1

## 2013-11-10 MED ORDER — DIPHENHYDRAMINE HCL 12.5 MG/5ML PO ELIX
12.5000 mg | ORAL_SOLUTION | Freq: Four times a day (QID) | ORAL | Status: DC | PRN
  Filled 2013-11-10: qty 5

## 2013-11-10 MED ORDER — LIDOCAINE HCL (CARDIAC) 20 MG/ML IV SOLN
INTRAVENOUS | Status: DC | PRN
  Administered 2013-11-10: 100 mg via INTRAVENOUS

## 2013-11-10 SURGICAL SUPPLY — 56 items
APPLICATOR COTTON TIP 6IN STRL (MISCELLANEOUS) ×4 IMPLANT
APPLIER CLIP LOGIC TI 5 (MISCELLANEOUS) IMPLANT
APPLIER CLIP ROT 10 11.4 M/L (STAPLE)
BINDER ABD UNIV 10 28-50 (GAUZE/BANDAGES/DRESSINGS) ×1 IMPLANT
BINDER ABD UNIV 12 45-62 (WOUND CARE) ×1 IMPLANT
BINDER ABDOM UNIV 10 (GAUZE/BANDAGES/DRESSINGS) ×2
BINDER ABDOMINAL 46IN 62IN (WOUND CARE) ×2
BLADE SURG ROTATE 9660 (MISCELLANEOUS) ×2 IMPLANT
CANISTER SUCTION 2500CC (MISCELLANEOUS) ×2 IMPLANT
CHLORAPREP W/TINT 26ML (MISCELLANEOUS) ×2 IMPLANT
CLIP APPLIE ROT 10 11.4 M/L (STAPLE) IMPLANT
COVER SURGICAL LIGHT HANDLE (MISCELLANEOUS) ×2 IMPLANT
DECANTER SPIKE VIAL GLASS SM (MISCELLANEOUS) IMPLANT
DERMABOND ADHESIVE PROPEN (GAUZE/BANDAGES/DRESSINGS) ×1
DERMABOND ADVANCED (GAUZE/BANDAGES/DRESSINGS) ×1
DERMABOND ADVANCED .7 DNX12 (GAUZE/BANDAGES/DRESSINGS) ×1 IMPLANT
DERMABOND ADVANCED .7 DNX6 (GAUZE/BANDAGES/DRESSINGS) ×1 IMPLANT
DEVICE SECURE STRAP 25 ABSORB (INSTRUMENTS) ×6 IMPLANT
DEVICE TROCAR PUNCTURE CLOSURE (ENDOMECHANICALS) ×4 IMPLANT
DRAPE INCISE IOBAN 85X60 (DRAPES) ×2 IMPLANT
DRAPE UTILITY 15X26 W/TAPE STR (DRAPE) ×4 IMPLANT
DRSG PAD ABDOMINAL 8X10 ST (GAUZE/BANDAGES/DRESSINGS) ×2 IMPLANT
ELECT CAUTERY BLADE 6.4 (BLADE) ×2 IMPLANT
ELECT REM PT RETURN 9FT ADLT (ELECTROSURGICAL) ×2
ELECTRODE REM PT RTRN 9FT ADLT (ELECTROSURGICAL) ×1 IMPLANT
FILTER SMOKE EVAC LAPAROSHD (FILTER) ×2 IMPLANT
GLOVE BIO SURGEON STRL SZ7 (GLOVE) ×4 IMPLANT
GLOVE BIOGEL PI IND STRL 7.0 (GLOVE) ×1 IMPLANT
GLOVE BIOGEL PI IND STRL 7.5 (GLOVE) ×1 IMPLANT
GLOVE BIOGEL PI INDICATOR 7.0 (GLOVE) ×1
GLOVE BIOGEL PI INDICATOR 7.5 (GLOVE) ×1
GLOVE SURG SS PI 7.0 STRL IVOR (GLOVE) ×2 IMPLANT
GOWN STRL NON-REIN LRG LVL3 (GOWN DISPOSABLE) ×4 IMPLANT
KIT BASIN OR (CUSTOM PROCEDURE TRAY) ×2 IMPLANT
KIT ROOM TURNOVER OR (KITS) ×2 IMPLANT
MARKER SKIN DUAL TIP RULER LAB (MISCELLANEOUS) ×2 IMPLANT
MESH VENTRALIGHT ST 4.5IN (Mesh General) ×2 IMPLANT
MESH VENTRALIGHT ST 8X10 (Mesh General) ×2 IMPLANT
NEEDLE SPNL 22GX3.5 QUINCKE BK (NEEDLE) ×2 IMPLANT
NS IRRIG 1000ML POUR BTL (IV SOLUTION) ×2 IMPLANT
PAD ARMBOARD 7.5X6 YLW CONV (MISCELLANEOUS) ×4 IMPLANT
PENCIL BUTTON HOLSTER BLD 10FT (ELECTRODE) ×2 IMPLANT
SCALPEL HARMONIC ACE (MISCELLANEOUS) IMPLANT
SCISSORS LAP 5X35 DISP (ENDOMECHANICALS) ×2 IMPLANT
SET IRRIG TUBING LAPAROSCOPIC (IRRIGATION / IRRIGATOR) ×2 IMPLANT
SLEEVE ENDOPATH XCEL 5M (ENDOMECHANICALS) ×4 IMPLANT
SUT MNCRL AB 4-0 PS2 18 (SUTURE) ×2 IMPLANT
SUT NOVA NAB GS-21 0 18 T12 DT (SUTURE) ×6 IMPLANT
TOWEL OR 17X24 6PK STRL BLUE (TOWEL DISPOSABLE) IMPLANT
TOWEL OR 17X26 10 PK STRL BLUE (TOWEL DISPOSABLE) ×2 IMPLANT
TRAY FOLEY CATH 16FR SILVER (SET/KITS/TRAYS/PACK) ×2 IMPLANT
TRAY LAPAROSCOPIC (CUSTOM PROCEDURE TRAY) ×2 IMPLANT
TROCAR XCEL BLUNT TIP 100MML (ENDOMECHANICALS) IMPLANT
TROCAR XCEL NON-BLD 11X100MML (ENDOMECHANICALS) ×2 IMPLANT
TROCAR XCEL NON-BLD 5MMX100MML (ENDOMECHANICALS) ×2 IMPLANT
WATER STERILE IRR 1000ML POUR (IV SOLUTION) IMPLANT

## 2013-11-10 NOTE — Op Note (Signed)
Laparoscopic Ventral Hernia Repair/ laparoscopic lysis of adhesions Procedure Note  Indications: Symptomatic ventral hernias; multiple previous surgeries  Pre-operative Diagnosis: ventral hernia x 3   Post-operative Diagnosis: ventral hernia x 3  Surgeon: Shateka Petrea K.   Assistants: none  Anesthesia: General endotracheal anesthesia  ASA Class: 2  Procedure Details  The patient was seen in the Holding Room. The risks, benefits, complications, treatment options, and expected outcomes were discussed with the patient. The possibilities of reaction to medication, pulmonary aspiration, perforation of viscus, bleeding, recurrent infection, the need for additional procedures, failure to diagnose a condition, and creating a complication requiring transfusion or operation were discussed with the patient. The patient concurred with the proposed plan, giving informed consent.  The site of surgery properly noted/marked. The patient was taken to the operating room, identified as Barnabas Harries and the procedure verified as laparoscopic ventral hernia repair with mesh. A Time Out was held and the above information confirmed.    The patient was placed supine.  After establishing general anesthesia, a Foley catheter was placed.  The abdomen was prepped with Chloraprep and Ioban and draped in standard fashion.  He has a raw area in the middle of his wide midline scar that is moist, but does not appear to be infected.  This area was prepped with Betadine and then isolated with the Ioban drape.  A 5 mm Optiview was used the cannulate the peritoneal cavity in the left upper quadrant below the costal margin.  Pneumoperitoneum was obtained by insufflating CO2, maintaining a maximum pressure of 15 mmHg.  The 5 mm 30-degree laparoscope was inserted.  There were significant omental adhesions to the anterior abdominal wall in and around the hernia defects in the upper midline  An 5-mm port was placed in the left anterior  axillary line at the level of the umbilicus.   Harmonic scalpel and gentle traction were used to dissect the omental adhesions away from the anterior abdominal wall.  We cleared the entire abdominal wall and were able to visualize two 2 cm fascial defects.  This extensive lysis of adhesions required additional ports on the right side, an 11-mm port in the RUQ and a 5 mm port in the RLQ.    The patient's pre-op CT scan identified a defect in the LLQ below his old colostomy site.  We dissected this area thoroughly.  He does not have a hernia defect here, but definitely has some muscle laxity in an area about 7 cm across.  A round 11.2 cm Bard Ventralight mesh was used to repair this area.  Four stay sutures of 0 Novofil were used to secure the mesh, along with the secure strap device.   We used a spinal needle to identify the extent of the hernia defects in the upper midline.  This covered an area of about  5 cms.  However, the remainder of the midline shows a lot of scarring and exposed sutures.  I felt that he would likely develop swiss-cheese defects in this area, so I decided to place mesh over the entire midline. We selected a 20 x 25 cm piece of Bard Ventralight mesh, which was cut down to 15 x 25 cm.  We placed six stay sutures of 0 Novofil around the edges of the mesh.  The mesh was then rolled up and inserted through the 11 mm port site.  The mesh was then unrolled.  The stay sutures were then pulled up through small stab incisions using the Endo-close  device.  This deployed the mesh widely over the fascial defects.  The stay sutures were then tied down.  The Secure Strap device was then used to tack down the edges of the mesh at 1 cm intervals circumferentially. We placed a few tacks inside the outer ring of tacks.  We inspected for hemostasis.  The fascial defect at the 11 mm port site was closed with a 0 Vicryl using the Endoclose device.  Pneumoperitoneum was then released as we removed the remainder  of the trocars.  The port sites were closed with 4-0 Monocryl.  All of the incisions and stay suture sites were then sealed with Dermabond.  An abdominal binder was placed around the patient's abdomen.  The patient was extubated and brought to the recovery room in stable condition.  All sponge, instrument, and needle counts were correct prior to closure and at the conclusion of the case.   Findings: 7 cm area of laxity in the LLQ; two upper midline hernia defects covering about 5 cm's; significant scarring  Estimated Blood Loss:  less than 100 mL         Complications:  None; patient tolerated the procedure well.         Disposition: PACU - hemodynamically stable.         Condition: stable  [name]. [name], MD, [facility]  General/ Trauma Surgery  11/10/2013 11:41 AM

## 2013-11-10 NOTE — Preoperative (Signed)
Beta Blockers   Reason not to administer Beta Blockers:Not Applicable 

## 2013-11-10 NOTE — Anesthesia Procedure Notes (Signed)
Procedure Name: Intubation Date/Time: 11/10/2013 8:42 AM Performed by: Angelica Pou Pre-anesthesia Checklist: Patient identified, Patient being monitored, Emergency Drugs available, Timeout performed and Suction available Patient Re-evaluated:Patient Re-evaluated prior to inductionOxygen Delivery Method: Circle system utilized Preoxygenation: Pre-oxygenation with 100% oxygen Intubation Type: IV induction Ventilation: Mask ventilation without difficulty and Oral airway inserted - appropriate to patient size Laryngoscope Size: Mac and 4 Grade View: Grade I Tube type: Oral Tube size: 7.5 mm Number of attempts: 1 Airway Equipment and Method: Stylet,  Bite block and Oral airway Placement Confirmation: ETT inserted through vocal cords under direct vision,  breath sounds checked- equal and bilateral and positive ETCO2 Secured at: 23 cm Tube secured with: Tape Dental Injury: Teeth and Oropharynx as per pre-operative assessment  Comments: Smooth IV induction by Dr Katrinka Blazing; easy atraumatic intubation by Pasty Arch CRNA. Lips/teeth/gums intact.

## 2013-11-10 NOTE — Anesthesia Preprocedure Evaluation (Addendum)
Anesthesia Evaluation  Patient identified by MRN, date of birth, ID band Patient awake    Reviewed: Allergy & Precautions, H&P , NPO status , Patient's Chart, lab work & pertinent test results  History of Anesthesia Complications Negative for: history of anesthetic complications  Airway Mallampati: II TM Distance: >3 FB Neck ROM: Full    Dental  (+) Loose and Dental Advisory Given,    Pulmonary sleep apnea ,  Noncompliant w CPAP breath sounds clear to auscultation  Pulmonary exam normal       Cardiovascular Exercise Tolerance: Good hypertension, Pt. on medications Rhythm:Regular Rate:Normal     Neuro/Psych  Headaches, Depression    GI/Hepatic GERD-  Poorly Controlled,  Endo/Other  Hypothyroidism   Renal/GU Renal diseaseHx ARF following prev hernia repair d/t abd compartment syndrome. Cr currently 1.0     Musculoskeletal   Abdominal   Peds  Hematology   Anesthesia Other Findings   Reproductive/Obstetrics                         Anesthesia Physical Anesthesia Plan  ASA: III  Anesthesia Plan: General   Post-op Pain Management:    Induction: Intravenous  Airway Management Planned: Oral ETT  Additional Equipment:   Intra-op Plan:   Post-operative Plan: Extubation in OR  Informed Consent:   Plan Discussed with:   Anesthesia Plan Comments:         Anesthesia Quick Evaluation                                  Anesthesia Evaluation  Patient identified by MRN, date of birth, ID bandGeneral Assessment Comment:Pt sedated on midazolam and fentanyl  Reviewed: Allergy & Precautions, H&P , NPO status , Patient's Chart, lab work & pertinent test results  Airway      Comment: Already intubated Dental   Pulmonary sleep apnea ,          Cardiovascular hypertension,     Neuro/Psych Depression    GI/Hepatic GERD-  ,  Endo/Other  Hypothyroidism   Renal/GU ARFRenal  disease     Musculoskeletal   Abdominal   Peds  Hematology   Anesthesia Other Findings   Reproductive/Obstetrics                           Anesthesia Physical Anesthesia Plan  ASA: III  Anesthesia Plan: General   Post-op Pain Management:    Induction: Intravenous  Airway Management Planned: Oral ETT  Additional Equipment:   Intra-op Plan:   Post-operative Plan: Post-operative intubation/ventilation  Informed Consent: I have reviewed the patients History and Physical, chart, labs and discussed the procedure including the risks, benefits and alternatives for the proposed anesthesia with the patient or authorized representative who has indicated his/her understanding and acceptance.     Plan Discussed with: CRNA and Surgeon  Anesthesia Plan Comments:         Anesthesia Quick Evaluation

## 2013-11-10 NOTE — Transfer of Care (Signed)
Immediate Anesthesia Transfer of Care Note  Patient: Douglas Wiggins  Procedure(s) Performed: Procedure(s): LAPAROSCOPIC VENTRAL HERNIA  (N/A) INSERTION OF MESH (N/A)  Patient Location: PACU  Anesthesia Type:General  Level of Consciousness: awake, alert , oriented and patient cooperative  Airway & Oxygen Therapy: Patient Spontanous Breathing and Patient connected to nasal cannula oxygen  Post-op Assessment: Report given to PACU RN, Post -op Vital signs reviewed and stable and Patient moving all extremities  Post vital signs: Reviewed and stable  Complications: No apparent anesthesia complications

## 2013-11-10 NOTE — H&P (Signed)
Douglas Wiggins is an 47 y.o. male.   Chief Complaint: Ventral hernias HPI: The patient is status post exploratory laparotomy, partial colon resection, and suture repair of a ventral hernia November 2013. This was complicated by abdominal compartment syndrome that required decompression and open abdomen for several days. His abdominal wall was closed on 10/29/12 with relaxing incisions and closure of the midline fascia with interrupted figure-of-eight #1 Novafil sutures. The patient has been having increasing abdominal pain mostly at the upper end of his incision. He has developed 2 bulges in this area. These remain reducible. The patient has lost his job since he is unable to lift heavy objects. His appetite and bowel movements remain normal. He is not having any pain when he is relaxed and supine.  His left lower quadrant colostomy site is well-healed with no sign of hernia. The patient has a wide thick scar and his midline where his wound healed by secondary intention. There is some crusting in the middle of this incision. No drainage noted. He has 2 visible palpable ventral hernias above his midline scar. These remain soft and reducible. These enlarged with a scalpel maneuver. I cannot palpate any other ventral hernias.   Past Medical History  Diagnosis Date  . Thyroid disease   . Depression   . Diverticulitis   . Pneumonia   . GERD (gastroesophageal reflux disease)   . Arthritis   . Hypertension     EKG 01/16/12 EPIC  . Sleep apnea     does not wear cpap/states MILD- no sleep study in years  . Hypothyroidism   . Head injury, closed   . Blood in stool   . Headache(784.0)     at times  . Lichen planus     Past Surgical History  Procedure Laterality Date  . Knee surgery    . Shoulder surgery    . Colostomy revision  01/06/2012    Procedure: COLON RESECTION SIGMOID;  Surgeon: [name]. [name], MD;  Location: [facility];  Service: General;  Laterality: N/A;  with hartmans procedure  . Ankle  surgery      arthroscopy right with excision  and scrapping of bone  . Colon surgery  01/2012  . Colostomy closure  04/13/2012    Procedure: COLOSTOMY CLOSURE;  Surgeon: [name]. [name], MD;  Location: [facility];  Service: General;  Laterality: N/A;  . Carpal tunnel release    . Ventral hernia repair  10/26/2012    Procedure: HERNIA REPAIR VENTRAL ADULT;  Surgeon: [name]. [name], MD;  Location: [facility] OR;  Service: General;  Laterality: N/A;  Primary Repair Ventral Hernia  . Laparotomy  10/26/2012    Procedure: EXPLORATORY LAPAROTOMY;  Surgeon: [name]. [name], MD;  Location: [facility] OR;  Service: General;  Laterality: N/A;  . Colon resection  10/26/2012    Procedure: COLON RESECTION;  Surgeon: [name]. [name], MD;  Location: [facility] OR;  Service: General;  Laterality: N/A;  Partial colon resection with anastomosis  . Laparotomy  10/27/2012    Procedure: EXPLORATORY LAPAROTOMY;  Surgeon: Liz Malady, MD;  Location: [facility] OR;  Service: General;  Laterality: N/A;  . Laparotomy  10/29/2012    Procedure: EXPLORATORY LAPAROTOMY;  Surgeon: [name]. [name], MD;  Location: [facility] OR;  Service: General;  Laterality: N/A;  exploratory laparotomy, partial closure of abdominal wound,    . Application of wound vac  10/29/2012    Procedure: APPLICATION OF WOUND VAC;  Surgeon: [name]. [name], MD;  Location: [facility]  OR;  Service: General;;  . Hernia repair      History reviewed. No pertinent family history. Social History:  reports that he has never smoked. He has never used smokeless tobacco. He reports that he does not drink alcohol or use illicit drugs.  Allergies:  Allergies  Allergen Reactions  . Sulfa Antibiotics Hives and Rash    Medications Prior to Admission  Medication Sig Dispense Refill  . escitalopram (LEXAPRO) 10 MG tablet Take 10 mg by mouth daily.       . hydrOXYzine (VISTARIL) 50 MG capsule Take 50 mg by mouth at bedtime as needed for anxiety.       Marland Kitchen levothyroxine (SYNTHROID, LEVOTHROID) 100 MCG  tablet Take 100 mcg by mouth daily before breakfast.      . losartan (COZAAR) 100 MG tablet Take 100 mg by mouth daily.       . meloxicam (MOBIC) 15 MG tablet Take 15 mg by mouth daily.       Marland Kitchen zolpidem (AMBIEN) 10 MG tablet Take 5-10 mg by mouth at bedtime as needed. For sleep        Results for orders placed during the hospital encounter of 11/09/13 (from the past 48 hour(s))  COMPREHENSIVE METABOLIC PANEL     Status: Abnormal   Collection Time    11/09/13  1:34 PM      Result Value Range   Sodium 136  135 - 145 mEq/L   Potassium 4.4  3.5 - 5.1 mEq/L   Chloride 98  96 - 112 mEq/L   CO2 27  19 - 32 mEq/L   Glucose, Bld 77  70 - 99 mg/dL   BUN 18  6 - 23 mg/dL   Creatinine, Ser 8.29  0.50 - 1.35 mg/dL   Calcium 9.6  8.4 - 56.2 mg/dL   Total Protein 7.6  6.0 - 8.3 g/dL   Albumin 4.0  3.5 - 5.2 g/dL   AST 28  0 - 37 U/L   ALT 33  0 - 53 U/L   Alkaline Phosphatase 83  39 - 117 U/L   Total Bilirubin 0.6  0.3 - 1.2 mg/dL   GFR calc non Af Amer 89 (*) >90 mL/min   GFR calc Af Amer >90  >90 mL/min   Comment: (NOTE)     The eGFR has been calculated using the CKD EPI equation.     This calculation has not been validated in all clinical situations.     eGFR's persistently <90 mL/min signify possible Chronic Kidney     Disease.  CBC     Status: None   Collection Time    11/09/13  1:34 PM      Result Value Range   WBC 7.8  4.0 - 10.5 K/uL   RBC 5.51  4.22 - 5.81 MIL/uL   Hemoglobin 16.9  13.0 - 17.0 g/dL   HCT 13.0  86.5 - 78.4 %   MCV 86.6  78.0 - 100.0 fL   MCH 30.7  26.0 - 34.0 pg   MCHC 35.4  30.0 - 36.0 g/dL   RDW 69.6  29.5 - 28.4 %   Platelets 204  150 - 400 K/uL   Dg Chest 2 View  11/09/2013   CLINICAL DATA:  Preop for ventral hernia repair.  Hypertension.  EXAM: CHEST  2 VIEW  COMPARISON:  10/30/2012  FINDINGS: The heart size and mediastinal contours are within normal limits. Both lungs are clear. There is mild widening of the  right AC joint, stable. The bony thorax is  otherwise intact.  IMPRESSION: No active cardiopulmonary disease.   Electronically Signed   By: Amie Portland M.D.   On: 11/09/2013 16:16   CLINICAL DATA: Subsequent encounter for ventral abdominal wall  hernia. Prior ventral hernia repair in November, 2013. Patient  presents now with greater than 2 month history of mid abdominal  pain. Surgical history also includes prior resection of sigmoid  colon due to diverticulitis with colostomy and reanastomosis.  EXAM:  CT ABDOMEN AND PELVIS WITH CONTRAST  TECHNIQUE:  Multidetector CT imaging of the abdomen and pelvis was performed  using the standard protocol following bolus administration of  intravenous contrast.  CONTRAST: OMNIPAQUE IOHEXOL 300 MG/ML IV. Oral contrast was  also administered.  COMPARISON: 02/26/2013, 06/06/2012, 01/05/2012.  FINDINGS:  Adjacent small anterior abdominal wall hernias at the upper end of  the prior surgical scar. The adjacent hernias are in the midline and  just to the right of midline and contain normal appearing small  bowel. Bowel within the anterior abdominal wall defects is a new  finding since the most recent prior CT. A hernia situated between  the left rectus abdominus muscle and the left oblique abdominal  muscles contains fat, but the defect has increased in size since the  prior CT, now approximating 4 cm (previously 2 cm); the sigmoid  colon just distal to the anastomosis is in close proximity to this  defect, but is not within the defect currently.  Stomach normal in appearance. Circumferential thickening of the wall  of the visualized distal esophagus, unchanged. Small bowel normal in  appearance. Widely patent colocolic anastomosis. Apparent wall  thickening involving the ascending and transverse colon is similar  to prior and likely due to incomplete distention. Normal appearing  long appendix in the right upper pelvis. No ascites.  Diffuse hepatic steatosis without focal hepatic  parenchymal  abnormality, unchanged. Liver upper normal in size. Normal-appearing  spleen, pancreas, adrenal glands, and kidneys; persistent fetal  lobulation accounts for the lobular contour of both kidneys.  Gallbladder unremarkable by CT. No biliary ductal dilation. Mild  left common iliac artery atherosclerosis, unchanged. No significant  lymphadenopathy.  Urinary bladder decompressed and unremarkable. Prostate gland and  seminal vesicles normal for age. Small bilateral inguinal hernias  containing fat, unchanged.  Bone window images demonstrate lower thoracic spondylosis,  degenerative disk disease and spondylosis at L1-2, and degenerative  changes in the right sacroiliac joint. Visualized lung bases clear.  Heart size upper normal.  IMPRESSION:  1. Adjacent very small anterior abdominal wall hernias in the  midline and just to the right of midline, located just above the  upper end of the midline abdominal surgical scar. Each of these very  small hernias contains normal appearing small bowel.  2. Abdominal wall hernia situated between the left rectus muscle on  the left oblique muscles (Spigelian hernia), containing fat. This  hernia has increased in size since March, 2014. The sigmoid colon  just distal to the anastomosis is in close proximity to this hernia  but is not within the hernia.  3. Widely patent colocolic anastomosis at the level of the distal  descending and sigmoid colon.  4. Diffuse hepatic steatosis without focal hepatic parenchymal  abnormality.  5. Circumferential wall thickening of the visualized distal  esophagus without evidence of hiatal hernia. Query GE reflux.  6. Mild left common iliac artery atherosclerosis, advanced for age.  7. Small bilateral inguinal hernias containing fat,  unchanged.  Electronically Signed  By: Hulan Saas  On: 08/31/2013 15:00  ROS  Blood pressure 135/92, pulse 62, temperature 97.5 F (36.4 C), temperature source Oral,  resp. rate 18, SpO2 96.00%. Physical Exam  WDWN in NAD HEENT:  EOMI, sclera anicteric Neck:  No masses, no thyromegaly Lungs:  CTA bilaterally; normal respiratory effort CV:  Regular rate and rhythm; no murmurs Abd:  +bowel sounds, soft, non-tender, no masses His left lower quadrant colostomy site is well-healed with no sign of hernia. The patient has a wide thick scar and his midline where his wound healed by secondary intention. There is some crusting in the middle of this incision. No drainage noted. He has 2 visible palpable ventral hernias above his midline scar. These remain soft and reducible. These enlarged with a scalpel maneuver. I cannot palpate any other ventral hernias.  Ext:  Well-perfused; no edema Skin:  Warm, dry; no sign of jaundice  Assessment/Plan Impression: Multiple ventral hernias  Plan: We will attempt laparoscopic ventral hernia repair with mesh with the possibility of conversion to an open procedure.The surgical procedure has been discussed with the patient. Potential risks, benefits, alternative treatments, and expected outcomes have been explained. All of the patient's questions at this time have been answered. The likelihood of reaching the patient's treatment goal is good. The patient understand the proposed surgical procedure and wishes to proceed.   Alwaleed Obeso K. 11/10/2013, 8:10 AM

## 2013-11-10 NOTE — Anesthesia Postprocedure Evaluation (Signed)
  Anesthesia Post-op Note  Patient: Douglas Wiggins  Procedure(s) Performed: Procedure(s): LAPAROSCOPIC VENTRAL HERNIA  (N/A) INSERTION OF MESH (N/A)  Patient Location: PACU  Anesthesia Type:General  Level of Consciousness: awake, alert , oriented and patient cooperative  Airway and Oxygen Therapy: Patient Spontanous Breathing  Post-op Pain: mild  Post-op Assessment: Post-op Vital signs reviewed, Patient's Cardiovascular Status Stable, Respiratory Function Stable, Patent Airway, No signs of Nausea or vomiting and Pain level controlled  Post-op Vital Signs: stable  Complications: No apparent anesthesia complications

## 2013-11-11 ENCOUNTER — Encounter (HOSPITAL_COMMUNITY): Payer: Self-pay | Admitting: Surgery

## 2013-11-11 LAB — CBC
HCT: 44.7 % (ref 39.0–52.0)
Hemoglobin: 15.8 g/dL (ref 13.0–17.0)
MCH: 30.7 pg (ref 26.0–34.0)
MCHC: 35.3 g/dL (ref 30.0–36.0)
MCV: 87 fL (ref 78.0–100.0)
Platelets: 215 10*3/uL (ref 150–400)
RBC: 5.14 MIL/uL (ref 4.22–5.81)
RDW: 13.4 % (ref 11.5–15.5)
WBC: 12.5 10*3/uL — ABNORMAL HIGH (ref 4.0–10.5)

## 2013-11-11 LAB — BASIC METABOLIC PANEL
CO2: 28 mEq/L (ref 19–32)
Calcium: 8.8 mg/dL (ref 8.4–10.5)
Chloride: 98 mEq/L (ref 96–112)
Creatinine, Ser: 0.87 mg/dL (ref 0.50–1.35)
GFR calc non Af Amer: >90 mL/min (ref >90)
Glucose, Bld: 115 mg/dL — ABNORMAL HIGH (ref 70–99)
Sodium: 137 mEq/L (ref 135–145)

## 2013-11-11 NOTE — Progress Notes (Signed)
1 Day Post-Op  Subjective: Sore, pain is tolerable Voiding well No nausea Passing flatus  Objective: Vital signs in last 24 hours: Temp:  [97.5 F (36.4 C)-98.6 F (37 C)] 97.5 F (36.4 C) (12/11 0946) Pulse Rate:  [70-97] 70 (12/11 0946) Resp:  [13-22] 18 (12/11 0946) BP: (132-162)/(71-103) 133/84 mmHg (12/11 0946) SpO2:  [91 %-96 %] 93 % (12/11 0946) Last BM Date: 11/10/13  Intake/Output from previous day: 12/10 0701 - 12/11 0700 In: 3996 [I.V.:3996] Out: 6450 [Urine:6450] Intake/Output this shift: Total I/O In: 240 [P.O.:240] Out: 100 [Urine:100]  General appearance: alert, cooperative and no distress GI: soft, tender around incisions;  Incisions c/d/i  Lab Results:   Recent Labs  11/10/13 1720 11/11/13 0430  WBC 12.8* 12.5*  HGB 16.9 15.8  HCT 48.0 44.7  PLT 203 215   BMET  Recent Labs  11/09/13 1334 11/10/13 1720 11/11/13 0430  NA 136  --  137  K 4.4  --  3.9  CL 98  --  98  CO2 27  --  28  GLUCOSE 77  --  115*  BUN 18  --  12  CREATININE 1.00 0.95 0.87  CALCIUM 9.6  --  8.8   PT/INR No results found for this basename: LABPROT, INR,  in the last 72 hours ABG No results found for this basename: PHART, PCO2, PO2, HCO3,  in the last 72 hours  Studies/Results: Dg Chest 2 View  11/09/2013   CLINICAL DATA:  Preop for ventral hernia repair.  Hypertension.  EXAM: CHEST  2 VIEW  COMPARISON:  10/30/2012  FINDINGS: The heart size and mediastinal contours are within normal limits. Both lungs are clear. There is mild widening of the right AC joint, stable. The bony thorax is otherwise intact.  IMPRESSION: No active cardiopulmonary disease.   Electronically Signed   By: Amie Portland M.D.   On: 11/09/2013 16:16    Anti-infectives: Anti-infectives   Start     Dose/Rate Route Frequency Ordered Stop   11/10/13 1400  ceFAZolin (ANCEF) IVPB 1 g/50 mL premix     1 g 100 mL/hr over 30 Minutes Intravenous Every 6 hours 11/10/13 1359 11/11/13 0300   11/10/13  0600  ceFAZolin (ANCEF) 3 g in dextrose 5 % 50 mL IVPB     3 g 160 mL/hr over 30 Minutes Intravenous On call to O.R. 11/09/13 1443 11/10/13 0845      Assessment/Plan: s/p Procedure(s): LAPAROSCOPIC VENTRAL HERNIA  (N/A) INSERTION OF MESH (N/A) Continue PCA for now. Mobilize Advance diet   LOS: 1 day    Douglas Wiggins K. 11/11/2013

## 2013-11-12 LAB — CBC
HCT: 44 % (ref 39.0–52.0)
MCH: 29.1 pg (ref 26.0–34.0)
MCV: 87.1 fL (ref 78.0–100.0)
Platelets: 175 10*3/uL (ref 150–400)
RBC: 5.05 MIL/uL (ref 4.22–5.81)
RDW: 13.4 % (ref 11.5–15.5)

## 2013-11-12 LAB — BASIC METABOLIC PANEL
BUN: 10 mg/dL (ref 6–23)
CO2: 28 mEq/L (ref 19–32)
Calcium: 9.4 mg/dL (ref 8.4–10.5)
Chloride: 95 mEq/L — ABNORMAL LOW (ref 96–112)
Creatinine, Ser: 0.99 mg/dL (ref 0.50–1.35)
Glucose, Bld: 114 mg/dL — ABNORMAL HIGH (ref 70–99)
Potassium: 4 mEq/L (ref 3.5–5.1)

## 2013-11-12 MED ORDER — BACITRACIN-NEOMYCIN-POLYMYXIN OINTMENT TUBE
TOPICAL_OINTMENT | Freq: Every day | CUTANEOUS | Status: DC
  Administered 2013-11-12 – 2013-11-13 (×2): via TOPICAL
  Filled 2013-11-12 (×2): qty 15

## 2013-11-12 MED ORDER — HYDROMORPHONE HCL PF 1 MG/ML IJ SOLN: 1.0000 mg | INTRAMUSCULAR | Status: DC | PRN

## 2013-11-12 MED ORDER — OXYCODONE-ACETAMINOPHEN 5-325 MG PO TABS
1.0000 | ORAL_TABLET | Freq: Four times a day (QID) | ORAL | Status: DC | PRN
  Administered 2013-11-12 – 2013-11-13 (×3): 2 via ORAL
  Filled 2013-11-12 (×3): qty 2

## 2013-11-12 MED ORDER — BISACODYL 5 MG PO TBEC
5.0000 mg | DELAYED_RELEASE_TABLET | Freq: Every day | ORAL | Status: DC | PRN
  Administered 2013-11-12: 5 mg via ORAL
  Filled 2013-11-12: qty 1

## 2013-11-12 NOTE — Progress Notes (Signed)
2 Days Post-Op  Subjective: Feeling better - more mobile Ambulating in halls Much flatus - no BM yet   Objective: Vital signs in last 24 hours: Temp:  [97.5 F (36.4 C)-98.3 F (36.8 C)] 98.1 F (36.7 C) (12/12 0523) Pulse Rate:  [70-86] 80 (12/12 0523) Resp:  [15-21] 15 (12/12 0730) BP: (133-157)/(84-98) 155/95 mmHg (12/12 0523) SpO2:  [93 %-99 %] 96 % (12/12 0730) FiO2 (%):  [93 %-99 %] 99 % (12/12 0400) Last BM Date: 11/10/13  Intake/Output from previous day: 12/11 0701 - 12/12 0700 In: 1697 [P.O.:900; I.V.:797] Out: 900 [Urine:900] Intake/Output this shift:    General appearance: alert, cooperative and no distress GI: non-distended; mild tenderness around laparoscopic incisions; no sign of hernia Raw area in midline incision - minimal drainage  Lab Results:   Recent Labs  11/11/13 0430 11/12/13 0543  WBC 12.5* 10.4  HGB 15.8 14.7  HCT 44.7 44.0  PLT 215 175   BMET  Recent Labs  11/11/13 0430 11/12/13 0543  NA 137 133*  K 3.9 4.0  CL 98 95*  CO2 28 28  GLUCOSE 115* 114*  BUN 12 10  CREATININE 0.87 0.99  CALCIUM 8.8 9.4   PT/INR No results found for this basename: LABPROT, INR,  in the last 72 hours ABG No results found for this basename: PHART, PCO2, PO2, HCO3,  in the last 72 hours  Studies/Results: No results found.  Anti-infectives: Anti-infectives   Start     Dose/Rate Route Frequency Ordered Stop   11/10/13 1400  ceFAZolin (ANCEF) IVPB 1 g/50 mL premix     1 g 100 mL/hr over 30 Minutes Intravenous Every 6 hours 11/10/13 1359 11/11/13 0300   11/10/13 0600  ceFAZolin (ANCEF) 3 g in dextrose 5 % 50 mL IVPB     3 g 160 mL/hr over 30 Minutes Intravenous On call to O.R. 11/09/13 1443 11/10/13 0845      Assessment/Plan: s/p Procedure(s): LAPAROSCOPIC VENTRAL HERNIA  (N/A) INSERTION OF MESH (N/A) Plan for discharge tomorrow D/C PCA; PO pain meds;   LOS: 2 days    Taveon Enyeart K. 11/12/2013

## 2013-11-13 MED ORDER — OXYCODONE-ACETAMINOPHEN 5-325 MG PO TABS: 1.0000 | ORAL_TABLET | Freq: Four times a day (QID) | ORAL | Status: DC | PRN

## 2013-11-13 NOTE — Progress Notes (Signed)
Discharge patient. Discharge instruction given, no question verbalized 

## 2013-11-13 NOTE — Discharge Summary (Signed)
Physician Discharge Summary  Patient ID: Douglas Wiggins MRN: [id number] DOB/AGE: [birth year] 47 y.o.  Admit date: 11/10/2013 Discharge date: 11/13/2013  Admission Diagnoses:  Recurrent ventral hernias  Discharge Diagnoses: same Active Problems:   Ventral hernia   Discharged Condition: good  Hospital Course: Laparoscopic lysis of adhesions/ ventral hernia repair with mesh on 11/10/13.  Pain control required PCA use for two days.  Weaned to [employer] on POD#2 and is now ready for discharge.  + BM; tolerating regular diet  Consults: None  Significant Diagnostic Studies: none  Treatments: surgery: Laparoscopic lysis of adhesions/ ventral hernia repair with mesh  Discharge Exam: Blood pressure 132/80, pulse 74, temperature 98.7 F (37.1 C), temperature source Oral, resp. rate 18, SpO2 97.00%. General appearance: alert, cooperative and no distress Incisions c/d/i; no sign of recurrent hernia; small raw area in midline with minimal drainage  Disposition: 06-Home-Health Care Svc  Discharge Orders   Future Appointments Provider Department Dept Phone   12/06/2013 9:10 AM [name]. Deasha Clendenin, MD [facility] Surgery, [place] [phone]   Future Orders Complete By Expires   Call MD for:  persistant nausea and vomiting  As directed    Call MD for:  redness, tenderness, or signs of infection (pain, swelling, redness, odor or green/yellow discharge around incision site)  As directed    Call MD for:  severe uncontrolled pain  As directed    Call MD for:  temperature >100.4  As directed    Diet general  As directed    Discharge wound care:  As directed    Comments:     Neosporin and dry gauze over small open area in mid-abdomen. Change daily.   Driving Restrictions  As directed    Comments:     Do not drive while taking pain medications   Increase activity slowly  As directed    May shower / Bathe  As directed    May walk up steps  As directed        Medication List         escitalopram 10 MG tablet  Commonly known as:  LEXAPRO  Take 10 mg by mouth daily.     hydrOXYzine 50 MG capsule  Commonly known as:  VISTARIL  Take 50 mg by mouth at bedtime as needed for anxiety.     levothyroxine 100 MCG tablet  Commonly known as:  SYNTHROID, LEVOTHROID  Take 100 mcg by mouth daily before breakfast.     losartan 100 MG tablet  Commonly known as:  COZAAR  Take 100 mg by mouth daily.     meloxicam 15 MG tablet  Commonly known as:  MOBIC  Take 15 mg by mouth daily.     oxyCODONE-acetaminophen 5-325 MG per tablet  Commonly known as:  PERCOCET/ROXICET  Take 1-2 tablets by mouth every 6 (six) hours as needed for moderate pain.     zolpidem 10 MG tablet  Commonly known as:  AMBIEN  Take 5-10 mg by mouth at bedtime as needed. For sleep           Follow-up Information   Follow up with Wynona Luna., MD. Schedule an appointment as soon as possible for a visit in 3 weeks.   Specialty:  General Surgery   Contact information:   [address] [place] [address] [phone]       Signed: Wynona Luna. 11/13/2013, 7:39 AM

## 2013-11-22 ENCOUNTER — Encounter (INDEPENDENT_AMBULATORY_CARE_PROVIDER_SITE_OTHER): Payer: Self-pay

## 2013-11-22 NOTE — Progress Notes (Signed)
Prescription written for Percocet #40.

## 2013-11-22 NOTE — Progress Notes (Signed)
Patient comes into office requesting a refill on his pain medication.  Patient s/p Vental hernia Repair on 11/10/13.  Patient rec'd a prescription for Oxycodone 5/325mg , #50.  Patient aware that Dr. Fatima Sanger not available and we will forward the request to Urgent Office for review.  Patient waiting in lobby.

## 2013-11-30 ENCOUNTER — Encounter (INDEPENDENT_AMBULATORY_CARE_PROVIDER_SITE_OTHER): Payer: BC Managed Care – PPO | Admitting: Surgery

## 2013-12-06 ENCOUNTER — Encounter (INDEPENDENT_AMBULATORY_CARE_PROVIDER_SITE_OTHER): Payer: Self-pay | Admitting: Surgery

## 2013-12-06 ENCOUNTER — Ambulatory Visit (INDEPENDENT_AMBULATORY_CARE_PROVIDER_SITE_OTHER): Payer: BC Managed Care – PPO | Admitting: Surgery

## 2013-12-06 VITALS — BP 122/81 | HR 70 | Temp 98.1°F | Resp 18 | Ht 76.0 in | Wt 271.4 lb

## 2013-12-06 DIAGNOSIS — K432 Incisional hernia without obstruction or gangrene: Secondary | ICD-10-CM

## 2013-12-06 MED ORDER — OXYCODONE-ACETAMINOPHEN 5-325 MG PO TABS: 1.0000 | ORAL_TABLET | Freq: Four times a day (QID) | ORAL | Status: DC | PRN

## 2013-12-06 NOTE — Progress Notes (Signed)
Status post laparoscopic ventral hernia repair with mesh on 11/10/13. The patient had several Swiss cheese defects in his midline. In his left lower quadrant below his old colostomy site he has some muscle laxity but no clear hernia defect. We placed a separate piece of mesh in this left lower quadrant. We placed a 20 x 25 cm piece of mesh in his midline. The patient is doing reasonably well. He is having a lot of pain at the suture sites, especially in his left lower quadrant. There is no sign of a bulge in his midline. He does have some laxity remaining in his left lower quadrant. Occasionally this pain radiates down into his groin and testicle.  His incisions are well-healed with no sign of infection. No sign of recurrent hernia in the midline. He still has some scaling of his midline skin incision. The left lower quadrant continues to show some laxity but no obvious hernia.  I refilled his pain medication. He should wear an abdominal binder. He should continue limiting his level of activity. Followup 3 weeks.  Imogene Burn. Georgette Dover, MD, [facility] Surgery  General/ Trauma Surgery  12/06/2013 9:50 AM

## 2013-12-27 ENCOUNTER — Encounter (INDEPENDENT_AMBULATORY_CARE_PROVIDER_SITE_OTHER): Payer: Self-pay | Admitting: Surgery

## 2013-12-27 ENCOUNTER — Ambulatory Visit (INDEPENDENT_AMBULATORY_CARE_PROVIDER_SITE_OTHER): Payer: BC Managed Care – PPO | Admitting: Surgery

## 2013-12-27 ENCOUNTER — Encounter (INDEPENDENT_AMBULATORY_CARE_PROVIDER_SITE_OTHER): Payer: Self-pay

## 2013-12-27 VITALS — BP 142/98 | HR 68 | Temp 97.3°F | Resp 15 | Ht 76.0 in | Wt 275.6 lb

## 2013-12-27 DIAGNOSIS — K439 Ventral hernia without obstruction or gangrene: Secondary | ICD-10-CM

## 2013-12-27 MED ORDER — OXYCODONE-ACETAMINOPHEN 5-325 MG PO TABS: 1.0000 | ORAL_TABLET | Freq: Four times a day (QID) | ORAL | Status: DC | PRN

## 2013-12-27 NOTE — Progress Notes (Signed)
Status post laparoscopic ventral hernia repair with mesh on 11/10/13. He continues to have some pain in his left lower quadrant. He has an area of laxity in this area but not a clear hernia defect. I did repair this with a separate piece of mesh. His midline incision is feeling better. The small open area of granulation tissue is getting smaller. It is dry. No sign of recurrent hernia. He does have some fullness in his left lower quadrant but I cannot palpate a distinct hernia.  He should continue wearing his abdominal binder if he is going to be on his feet for any length of time. Hopefully as the mesh scars of the back of his muscle it will relieve the tension on the stay sutures which will decrease his pain. We will recheck him in 4 weeks. I gave him a refill of his pain medicine.  Imogene Burn. Georgette Dover, MD, [facility] Surgery  General/ Trauma Surgery  12/27/2013 9:16 AM

## 2014-01-10 ENCOUNTER — Telehealth (INDEPENDENT_AMBULATORY_CARE_PROVIDER_SITE_OTHER): Payer: Self-pay | Admitting: General Surgery

## 2014-01-10 NOTE — Telephone Encounter (Signed)
Pt called for additional pain medication.  He has already had his first refill, so understands Dr. Georgette Dover will need to okay additional refills for narcotics.  Also understands Dr. Georgette Dover is not available today, so will address this tomorrow.  He will wait for [place] to call.

## 2014-01-11 ENCOUNTER — Encounter (INDEPENDENT_AMBULATORY_CARE_PROVIDER_SITE_OTHER): Payer: Self-pay | Admitting: Surgery

## 2014-01-11 ENCOUNTER — Other Ambulatory Visit (INDEPENDENT_AMBULATORY_CARE_PROVIDER_SITE_OTHER): Payer: Self-pay | Admitting: Surgery

## 2014-01-11 ENCOUNTER — Telehealth (INDEPENDENT_AMBULATORY_CARE_PROVIDER_SITE_OTHER): Payer: Self-pay | Admitting: General Surgery

## 2014-01-11 MED ORDER — OXYCODONE-ACETAMINOPHEN 5-325 MG PO TABS: 1.0000 | ORAL_TABLET | Freq: Four times a day (QID) | ORAL | Status: DC | PRN

## 2014-01-11 NOTE — Telephone Encounter (Signed)
Called and spoke to patient mother and told her that we have a written Rx for percocet 5/325 1-2 tabs by mouth every 6 hours as needed #50. The Rx will be at the front desk

## 2014-01-31 ENCOUNTER — Encounter (INDEPENDENT_AMBULATORY_CARE_PROVIDER_SITE_OTHER): Payer: Self-pay | Admitting: Surgery

## 2014-01-31 ENCOUNTER — Ambulatory Visit (INDEPENDENT_AMBULATORY_CARE_PROVIDER_SITE_OTHER): Payer: BC Managed Care – PPO | Admitting: Surgery

## 2014-01-31 DIAGNOSIS — K432 Incisional hernia without obstruction or gangrene: Secondary | ICD-10-CM

## 2014-01-31 MED ORDER — OXYCODONE-ACETAMINOPHEN 5-325 MG PO TABS: 1.0000 | ORAL_TABLET | Freq: Four times a day (QID) | ORAL | Status: DC | PRN

## 2014-01-31 MED ORDER — GABAPENTIN 600 MG PO TABS: 600.0000 mg | ORAL_TABLET | Freq: Two times a day (BID) | ORAL | Status: DC

## 2014-01-31 NOTE — Progress Notes (Signed)
The patient returns for another postoperative visit. He continues to have some chronic pain in his right upper quadrant as well as his left lower quadrant. When he tries to do even light activity and the pain shoots across the middle of his abdomen. His midline wound seems to be healing nicely. All of his laparoscopic port sites are well healed. He has some laxity in the left lower quadrant but no clear ventral hernias palpated. He has point tenderness in his right upper quadrant near the costal margin.    We will attempt to use Neurontin in addition to his Percocet to see if this helps with the neuropathic pain that he has in his abdominal wall. We will recheck him in 4 weeks.  Douglas Wiggins. Georgette Dover, MD, [facility] Surgery  General/ Trauma Surgery  01/31/2014 2:04 PM

## 2014-02-14 ENCOUNTER — Telehealth (INDEPENDENT_AMBULATORY_CARE_PROVIDER_SITE_OTHER): Payer: Self-pay | Admitting: General Surgery

## 2014-02-14 NOTE — Telephone Encounter (Signed)
Pt of Dr. Georgette Dover called for refill of percocet.  Last fill of it on 01/31/14 for #50.  Explained Dr. Georgette Dover is not available today, so may need 24-48 hours to "turn around" his request.  Asked for CCS to leave message on his parent's home phone when it is ready at the front desk.

## 2014-02-15 ENCOUNTER — Encounter (INDEPENDENT_AMBULATORY_CARE_PROVIDER_SITE_OTHER): Payer: Self-pay | Admitting: Surgery

## 2014-02-15 ENCOUNTER — Other Ambulatory Visit (INDEPENDENT_AMBULATORY_CARE_PROVIDER_SITE_OTHER): Payer: Self-pay | Admitting: Surgery

## 2014-02-15 MED ORDER — OXYCODONE-ACETAMINOPHEN 5-325 MG PO TABS: 1.0000 | ORAL_TABLET | Freq: Four times a day (QID) | ORAL | Status: DC | PRN

## 2014-02-15 NOTE — Telephone Encounter (Signed)
Called patient this morning and spoke to his mother and told her that we have a Rx for  Oxycodone 5/325 1 tab by month every 6 hours has needed for severe pain #50 with out refills

## 2014-03-03 ENCOUNTER — Ambulatory Visit (INDEPENDENT_AMBULATORY_CARE_PROVIDER_SITE_OTHER): Payer: BC Managed Care – PPO | Admitting: Surgery

## 2014-03-03 ENCOUNTER — Encounter (INDEPENDENT_AMBULATORY_CARE_PROVIDER_SITE_OTHER): Payer: Self-pay | Admitting: Surgery

## 2014-03-03 VITALS — BP 126/80 | HR 80 | Temp 97.3°F | Resp 16 | Ht 76.0 in | Wt 271.8 lb

## 2014-03-03 DIAGNOSIS — K432 Incisional hernia without obstruction or gangrene: Secondary | ICD-10-CM

## 2014-03-03 MED ORDER — OXYCODONE-ACETAMINOPHEN 5-325 MG PO TABS: 1.0000 | ORAL_TABLET | Freq: Four times a day (QID) | ORAL | Status: DC | PRN

## 2014-03-03 NOTE — Progress Notes (Signed)
Status post laparoscopic repair of recurrent ventral hernia. The Neurontin seems to be helping with his abdominal wall discomfort. 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 His incision continues to contract to get smaller. There is some minimal amounts of drainage from the middle of his incision. He is keeping this more stress with Neosporin as well as skin lotion. There are no fluctuant areas. No purulent foul-smelling fluid is draining from the wound. No sign of recurrent hernia at this time. Appetite and bowel movements are good.  The patient has a lot of arthritic pain and is seeing a rheumatologist. He also has an appointment with the pain clinic. For now I refilled his pain prescription. We will recheck him in 4-5 weeks.  Douglas Wiggins. Georgette Dover, MD, [facility] Surgery  General/ Trauma Surgery  03/03/2014 9:22 AM

## 2014-03-18 ENCOUNTER — Other Ambulatory Visit (INDEPENDENT_AMBULATORY_CARE_PROVIDER_SITE_OTHER): Payer: Self-pay

## 2014-03-18 ENCOUNTER — Telehealth (INDEPENDENT_AMBULATORY_CARE_PROVIDER_SITE_OTHER): Payer: Self-pay

## 2014-03-18 DIAGNOSIS — K432 Incisional hernia without obstruction or gangrene: Secondary | ICD-10-CM

## 2014-03-18 MED ORDER — OXYCODONE-ACETAMINOPHEN 5-325 MG PO TABS: 1.0000 | ORAL_TABLET | Freq: Four times a day (QID) | ORAL | Status: DC | PRN

## 2014-03-18 NOTE — Telephone Encounter (Signed)
Patient calling into office requesting a refill on his Oxycodone 5/325mg .  Patient s/p ventral hernia repair in 11/2013 but, last office note states recurrent incisional ventral hernia repair.  Patient last rec'd a refill on 03/03/14 and has been referred to pain management and still awaiting an appointment.  Dr. Johney Maine reviewed in Urgent Office and refilled Oxycodone 5/325mg , #40.  Patient aware and will pick up at the front desk.

## 2014-04-08 ENCOUNTER — Ambulatory Visit (INDEPENDENT_AMBULATORY_CARE_PROVIDER_SITE_OTHER): Payer: 59 | Admitting: Surgery

## 2014-04-08 ENCOUNTER — Encounter (INDEPENDENT_AMBULATORY_CARE_PROVIDER_SITE_OTHER): Payer: Self-pay | Admitting: Surgery

## 2014-04-08 VITALS — BP 124/78 | HR 80 | Temp 97.8°F | Ht 76.0 in | Wt 271.0 lb

## 2014-04-08 DIAGNOSIS — K439 Ventral hernia without obstruction or gangrene: Secondary | ICD-10-CM

## 2014-04-08 MED ORDER — OXYCODONE-ACETAMINOPHEN 5-325 MG PO TABS: 1.0000 | ORAL_TABLET | Freq: Four times a day (QID) | ORAL | Status: DC | PRN

## 2014-04-08 NOTE — Progress Notes (Signed)
Status post laparoscopic ventral hernia repair with mesh on 11/10/13. The patient continues to have some soreness around his incision but this seems to be improved. The Neurontin seems to be helping. He still requires frequent use of Percocet but he also has a lot of arthritis pains. He has not yet been seen by rheumatology but does have a pain medicine appointment in about 3 weeks.  His midline wound is healing. There is a very small area of scab but otherwise the wound seems to be completely closed. No sign of recurrent hernia. He will always have some muscle laxity on the left side as that muscle seems to be atrophic and denervated. However at this time, there are no surgical complications that require any intervention. I gave him one more refill of his pain medication and will see him back as needed.  Douglas Wiggins. Georgette Dover, MD, [facility] Surgery  General/ Trauma Surgery  04/08/2014 12:58 PM

## 2014-06-08 ENCOUNTER — Encounter (INDEPENDENT_AMBULATORY_CARE_PROVIDER_SITE_OTHER): Payer: Self-pay | Admitting: General Surgery

## 2014-06-08 ENCOUNTER — Ambulatory Visit (INDEPENDENT_AMBULATORY_CARE_PROVIDER_SITE_OTHER): Payer: 59 | Admitting: General Surgery

## 2014-06-08 VITALS — BP 130/76 | HR 72 | Temp 97.8°F | Ht 76.0 in | Wt 271.0 lb

## 2014-06-08 DIAGNOSIS — R109 Unspecified abdominal pain: Secondary | ICD-10-CM

## 2014-06-08 NOTE — Patient Instructions (Signed)
We will call you to schedule your CT scan.  Follow up with Dr Georgette Dover after CT has been completed.

## 2014-06-08 NOTE — Progress Notes (Signed)
Douglas Wiggins is a 48 y.o. male who is here for a follow up visit regarding his abd pain.  He reports swelling and bloating after meals with intermittent abd wall pain that is spasmatic in nature.  He believes this pain is due to his being out of narcotics.  He is having regular BM's and denies nausea or vomiting.  Objective: Filed Vitals:   06/08/14 1515  BP: 130/76  Pulse: 72  Temp: 97.8 F (36.6 C)    General appearance: alert and cooperative GI: normal findings: soft, tender to palpation in lateral abd walls No hernia palpated  Assessment and Plan: Pt with acute on chronic abd pain with bloating after meals.  Will get CT to confirm no chronic obstructions are present.  RTO after CT to discuss further with Dr Georgette Dover.      Rosario Adie, MD [facility] Surgery, [place]

## 2014-06-09 ENCOUNTER — Other Ambulatory Visit (INDEPENDENT_AMBULATORY_CARE_PROVIDER_SITE_OTHER): Payer: Self-pay | Admitting: *Deleted

## 2014-06-09 ENCOUNTER — Telehealth (INDEPENDENT_AMBULATORY_CARE_PROVIDER_SITE_OTHER): Payer: Self-pay | Admitting: *Deleted

## 2014-06-09 NOTE — Telephone Encounter (Signed)
Dad, Deniro Laymon called back and he will relay the apt information below.  He is on the HIPPA for [place] to speak to.  Anderson Malta

## 2014-06-09 NOTE — Telephone Encounter (Signed)
Tried to call pt regarding CT scheduled at [facility]) [address]. 06-13-14 arriving at 1:50.  Drink 1st bottle contrast 12:00 and 2nd bottle 1:00.  No solid foods 4 hours prior to test.  No answer and no machine, could not leave message.  I tried the cell number listed and it was not in service.  If pt calls in, please advise pt of this appt.  Thanks!  Anderson Malta

## 2014-06-13 ENCOUNTER — Other Ambulatory Visit: Payer: BC Managed Care – PPO

## 2014-07-12 ENCOUNTER — Telehealth (INDEPENDENT_AMBULATORY_CARE_PROVIDER_SITE_OTHER): Payer: Self-pay

## 2014-07-12 NOTE — Telephone Encounter (Signed)
I was notified by [facility] that pt did not show up for his CT that was scheduled for 06-13-14 that was ordered by Dr. Marcello Moores.   Anderson Malta

## 2014-07-12 NOTE — Telephone Encounter (Signed)
Pt s/p ventral hernia repair on 11/10/13 by Dr Georgette Dover. Pts mother is calling stating that she is concerned about the incision. Mother states that wound is has been draining whitish pus. Mother denies any swelling,redness, fevers or chills. She states that the incision has been draining whitish thick pus. Mother was given choice to come in to see one of Dr Vonna Kotyk partner's tomorrow, she would rather see Dr Georgette Dover. Advised mother that Dr Georgette Dover is urgent office clinic on Thursday, mother states that she will call back Thursday to see if she can get appt with Dr Georgette Dover.

## 2014-07-14 ENCOUNTER — Telehealth (INDEPENDENT_AMBULATORY_CARE_PROVIDER_SITE_OTHER): Payer: Self-pay

## 2014-07-14 NOTE — Telephone Encounter (Signed)
Pt called stating having soreness at incision. No redness,swelling or drainage. Pt request appt to have Dr. Georgette Dover reck incision. Pt given appt and advised if area becomes red,feverish,swollen or drains he needs appt sooner. States understanding.

## 2014-07-29 ENCOUNTER — Ambulatory Visit (INDEPENDENT_AMBULATORY_CARE_PROVIDER_SITE_OTHER): Payer: 59 | Admitting: Surgery

## 2014-07-29 ENCOUNTER — Encounter (INDEPENDENT_AMBULATORY_CARE_PROVIDER_SITE_OTHER): Payer: Self-pay | Admitting: Surgery

## 2014-07-29 VITALS — BP 126/74 | HR 81 | Temp 97.6°F | Ht 76.0 in | Wt 269.0 lb

## 2014-07-29 DIAGNOSIS — K432 Incisional hernia without obstruction or gangrene: Secondary | ICD-10-CM

## 2014-07-29 MED ORDER — OXYCODONE-ACETAMINOPHEN 5-325 MG PO TABS: 1.0000 | ORAL_TABLET | Freq: Four times a day (QID) | ORAL | Status: DC | PRN

## 2014-07-29 NOTE — Progress Notes (Signed)
The patient is status post ventral hernia repair with mesh in December of 2014. He has noticed a suture protruding from his midline. This appears to be a fascial suture of Prolene or Novafil. He gets some drainage around the site. He still has some chronic abdominal pain but this seems to be moderately well controlled. He occasionally gets pain down in his left groin. He has a piece of mesh in this area and this is likely a stay suture.  His abdomen is soft and nondistended. He has some mild tenderness in the left lower quadrant just above the groin. No sign of bulging hernia in this area. The midline Thick scar seems to continue to contract. There are 2 small openings. There is a protruding suture that I removed. This area will likely drain for a couple of days and then should seal. This explains why the area never healed up.  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 We have signed his paperwork. We will see him back as needed.  Imogene Burn. Georgette Dover, MD, [facility] Surgery  General/ Trauma Surgery  07/29/2014 10:08 AM

## 2014-08-31 ENCOUNTER — Telehealth (INDEPENDENT_AMBULATORY_CARE_PROVIDER_SITE_OTHER): Payer: Self-pay

## 2014-08-31 NOTE — Telephone Encounter (Signed)
Pt calling for refill of oxycodone 5\325 #50. Note sent to Dr Georgette Dover in allscripts.

## 2015-12-20 ENCOUNTER — Emergency Department (HOSPITAL_COMMUNITY): Payer: Self-pay

## 2015-12-20 ENCOUNTER — Emergency Department (HOSPITAL_COMMUNITY)
Admission: EM | Admit: 2015-12-20 | Discharge: 2015-12-21 | Disposition: A | Discharge status: Home / Self Care | Payer: Self-pay | Attending: Emergency Medicine | Admitting: Emergency Medicine

## 2015-12-20 ENCOUNTER — Encounter (HOSPITAL_COMMUNITY): Payer: Self-pay

## 2015-12-20 DIAGNOSIS — Z87828 Personal history of other (healed) physical injury and trauma: Secondary | ICD-10-CM | POA: Insufficient documentation

## 2015-12-20 DIAGNOSIS — Z872 Personal history of diseases of the skin and subcutaneous tissue: Secondary | ICD-10-CM | POA: Insufficient documentation

## 2015-12-20 DIAGNOSIS — E7800 Pure hypercholesterolemia, unspecified: Secondary | ICD-10-CM | POA: Insufficient documentation

## 2015-12-20 DIAGNOSIS — Z791 Long term (current) use of non-steroidal anti-inflammatories (NSAID): Secondary | ICD-10-CM | POA: Insufficient documentation

## 2015-12-20 DIAGNOSIS — H8309 Labyrinthitis, unspecified ear: Secondary | ICD-10-CM | POA: Insufficient documentation

## 2015-12-20 DIAGNOSIS — M1990 Unspecified osteoarthritis, unspecified site: Secondary | ICD-10-CM | POA: Insufficient documentation

## 2015-12-20 DIAGNOSIS — Z8701 Personal history of pneumonia (recurrent): Secondary | ICD-10-CM | POA: Insufficient documentation

## 2015-12-20 DIAGNOSIS — E079 Disorder of thyroid, unspecified: Secondary | ICD-10-CM | POA: Insufficient documentation

## 2015-12-20 DIAGNOSIS — F329 Major depressive disorder, single episode, unspecified: Secondary | ICD-10-CM | POA: Insufficient documentation

## 2015-12-20 DIAGNOSIS — R0789 Other chest pain: Secondary | ICD-10-CM | POA: Insufficient documentation

## 2015-12-20 DIAGNOSIS — R9431 Abnormal electrocardiogram [ECG] [EKG]: Secondary | ICD-10-CM | POA: Insufficient documentation

## 2015-12-20 DIAGNOSIS — G8929 Other chronic pain: Secondary | ICD-10-CM | POA: Insufficient documentation

## 2015-12-20 DIAGNOSIS — Z8719 Personal history of other diseases of the digestive system: Secondary | ICD-10-CM | POA: Insufficient documentation

## 2015-12-20 DIAGNOSIS — Z79899 Other long term (current) drug therapy: Secondary | ICD-10-CM | POA: Insufficient documentation

## 2015-12-20 DIAGNOSIS — E039 Hypothyroidism, unspecified: Secondary | ICD-10-CM | POA: Insufficient documentation

## 2015-12-20 DIAGNOSIS — I10 Essential (primary) hypertension: Secondary | ICD-10-CM | POA: Insufficient documentation

## 2015-12-20 DIAGNOSIS — N289 Disorder of kidney and ureter, unspecified: Secondary | ICD-10-CM | POA: Insufficient documentation

## 2015-12-20 DIAGNOSIS — G43909 Migraine, unspecified, not intractable, without status migrainosus: Secondary | ICD-10-CM | POA: Insufficient documentation

## 2015-12-20 LAB — CBC
HEMATOCRIT: 48.5 % (ref 39.0–52.0)
Hemoglobin: 17.3 g/dL — ABNORMAL HIGH (ref 13.0–17.0)
MCH: 31.5 pg (ref 26.0–34.0)
MCHC: 35.7 g/dL (ref 30.0–36.0)
MCV: 88.2 fL (ref 78.0–100.0)
PLATELETS: 158 10*3/uL (ref 150–400)
RBC: 5.5 MIL/uL (ref 4.22–5.81)
RDW: 12.7 % (ref 11.5–15.5)
WBC: 7.7 10*3/uL (ref 4.0–10.5)

## 2015-12-20 LAB — BASIC METABOLIC PANEL
Anion gap: 12 (ref 5–15)
BUN: 17 mg/dL (ref 6–20)
CHLORIDE: 102 mmol/L (ref 101–111)
CO2: 23 mmol/L (ref 22–32)
CREATININE: 1.59 mg/dL — AB (ref 0.61–1.24)
Calcium: 9.6 mg/dL (ref 8.9–10.3)
GFR calc non Af Amer: 50 mL/min — ABNORMAL LOW (ref >60)
GFR, EST AFRICAN AMERICAN: 58 mL/min — AB (ref >60)
Glucose, Bld: 109 mg/dL — ABNORMAL HIGH (ref 65–99)
POTASSIUM: 3.9 mmol/L (ref 3.5–5.1)
Sodium: 137 mmol/L (ref 135–145)

## 2015-12-20 LAB — I-STAT TROPONIN, ED: Troponin i, poc: 0 ng/mL (ref 0.00–0.08)

## 2015-12-20 MED ORDER — ASPIRIN 81 MG PO CHEW
324.0000 mg | CHEWABLE_TABLET | Freq: Once | ORAL | Status: AC
  Administered 2015-12-20: 324 mg via ORAL
  Filled 2015-12-20: qty 4

## 2015-12-20 MED ORDER — MECLIZINE HCL 25 MG PO TABS
25.0000 mg | ORAL_TABLET | Freq: Once | ORAL | Status: AC
  Administered 2015-12-20: 25 mg via ORAL
  Filled 2015-12-20: qty 1

## 2015-12-20 MED ORDER — SODIUM CHLORIDE 0.9 % IV SOLN
1000.0000 mL | Freq: Once | INTRAVENOUS | Status: AC
  Administered 2015-12-20: 1000 mL via INTRAVENOUS

## 2015-12-20 MED ORDER — ONDANSETRON HCL 4 MG/2ML IJ SOLN
4.0000 mg | Freq: Once | INTRAMUSCULAR | Status: AC
  Administered 2015-12-20: 4 mg via INTRAVENOUS
  Filled 2015-12-20: qty 2

## 2015-12-20 MED ORDER — ONDANSETRON HCL 4 MG PO TABS: 4.0000 mg | ORAL_TABLET | Freq: Four times a day (QID) | ORAL | Status: DC | PRN

## 2015-12-20 MED ORDER — SODIUM CHLORIDE 0.9 % IV SOLN
1000.0000 mL | INTRAVENOUS | Status: DC
  Administered 2015-12-20: 1000 mL via INTRAVENOUS

## 2015-12-20 MED ORDER — MECLIZINE HCL 25 MG PO TABS: 25.0000 mg | ORAL_TABLET | Freq: Three times a day (TID) | ORAL | Status: DC | PRN

## 2015-12-20 NOTE — ED Provider Notes (Signed)
CSN: [id number]     Arrival date & time 12/20/15  1719 History   None    Chief Complaint  Patient presents with  . Chest Pain  . Abnormal ECG     (Consider location/radiation/quality/duration/timing/severity/associated sxs/prior Treatment) The history is provided by the patient.  50 year old male has not felt well for the last several days. He has felt swimmy-headed and off balance. He has been having intermittent chest pains which he describes as a sharp pain which is in the mid chest and sometimes radiates down his left arm. Pain can last anywhere from a few seconds to a few minutes. There is no associated dyspnea. He has had some nausea but no diaphoresis. He went to urgent care today where an ECG was done which showed an old MI and he was referred to the ED. He does have cardiac risk factors of hypertension and hyperlipidemia as well as family history of premature coronary atherosclerosis. No history of diabetes or tobacco abuse.  Past Medical History  Diagnosis Date  . Thyroid disease   . Depression   . Diverticulitis   . Pneumonia   . GERD (gastroesophageal reflux disease)   . Hypertension     EKG 01/16/12 EPIC  . Hypothyroidism   . Head injury, closed 12/06/1990  . Blood in stool   . Lichen planus   . High cholesterol   . Sleep apnea     does not wear cpap/states MILD- no sleep study in years  . History of blood transfusion 2013  . H/O hiatal hernia   . Headache(784.0)     "used to keep them; sinus allergies; more controlled now" (11/10/2013)  . Migraines     "rarely anymore" (11/10/2013)  . Arthritis     "I'm eat up w/it" (11/10/2013)  . Gout   . Chronic lower back pain    Past Surgical History  Procedure Laterality Date  . Knee arthroscopy Right 1990's  . Shoulder arthroscopy Right 1990's    "spurs and arthritis scraped off" (11/10/2013)  . 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 Georgette Dover, MD;  Location: [facility];   Service: General;  Laterality: N/A;  with hartmans procedure  . Ankle arthroscopy Right     with excision and scrapping of bone  . Colon surgery  01/2012  . Colostomy closure  04/13/2012    Procedure: COLOSTOMY CLOSURE;  Surgeon: Imogene Burn. Georgette Dover, MD;  Location: [facility];  Service: General;  Laterality: N/A;  . Carpal tunnel release Left 1990's  . Ventral hernia repair  10/26/2012    Procedure: HERNIA REPAIR VENTRAL ADULT;  Surgeon: Imogene Burn. Georgette Dover, MD;  Location: [place];  Service: General;  Laterality: N/A;  Primary Repair Ventral Hernia  . Laparotomy  10/26/2012    Procedure: EXPLORATORY LAPAROTOMY;  Surgeon: Imogene Burn. Georgette Dover, MD;  Location: [place];  Service: General;  Laterality: N/A;  . Colon resection  10/26/2012    Procedure: COLON RESECTION;  Surgeon: Imogene Burn. Georgette Dover, MD;  Location: [place] [facility];  Service: General;  Laterality: N/A;  Partial colon resection with anastomosis  . Laparotomy  10/27/2012    Procedure: EXPLORATORY LAPAROTOMY;  Surgeon: Zenovia Jarred, MD;  Location: [place];  Service: General;  Laterality: N/A;  . Laparotomy  10/29/2012    Procedure: EXPLORATORY LAPAROTOMY;  Surgeon: Imogene Burn. Georgette Dover, MD;  Location: [place];  Service: General;  Laterality: N/A;  exploratory laparotomy, partial closure of abdominal wound,    .  Application of wound vac  10/29/2012    Procedure: APPLICATION OF WOUND VAC;  Surgeon: Imogene Burn. Georgette Dover, MD;  Location: [place];  Service: General;;  . Hernia repair  10/26/2012; 11/10/2013    VHR; [place] w/mesh (11/10/2013)  . Reattachment hand Left 2000's    "pinky" (11/10/2013)  . Ventral hernia repair N/A 11/10/2013    Procedure: LAPAROSCOPIC VENTRAL HERNIA ;  Surgeon: Imogene Burn. Georgette Dover, MD;  Location: [place];  Service: General;  Laterality: N/A;  . Insertion of mesh N/A 11/10/2013    Procedure: INSERTION OF MESH;  Surgeon: Imogene Burn. Georgette Dover, MD;  Location: [place];  Service: General;  Laterality: N/A;   No family history on file. Social History  Substance Use  Topics  . Smoking status: Never Smoker   . Smokeless tobacco: Never Used  . Alcohol Use: Yes     Comment: 11/10/2013 "drink of beer once in a blue moon; used to drink too much as a teen"    Review of Systems  All other systems reviewed and are negative.     Allergies  Sulfa antibiotics  Home Medications   Prior to Admission medications   Medication Sig Start Date End Date Taking? Authorizing Provider  escitalopram (LEXAPRO) 10 MG tablet Take 10 mg by mouth daily.  09/09/13   Historical Provider, MD  gabapentin (NEURONTIN) 600 MG tablet Take 1 tablet (600 mg total) by mouth 2 (two) times daily. 01/31/14   Donnie Mesa, MD  hydrOXYzine (VISTARIL) 50 MG capsule Take 50 mg by mouth at bedtime as needed for anxiety.  09/09/13   Historical Provider, MD  levothyroxine (SYNTHROID, LEVOTHROID) 100 MCG tablet Take 100 mcg by mouth daily before breakfast.    Historical Provider, MD  losartan (COZAAR) 100 MG tablet Take 100 mg by mouth daily.  01/03/12   Historical Provider, MD  meloxicam (MOBIC) 15 MG tablet Take 15 mg by mouth daily.     Historical Provider, MD  oxyCODONE-acetaminophen (PERCOCET/ROXICET) 5-325 MG per tablet Take 1-2 tablets by mouth every 6 (six) hours as needed for moderate pain. 04/08/14   Donnie Mesa, MD  oxyCODONE-acetaminophen (PERCOCET/ROXICET) 5-325 MG per tablet Take 1 tablet by mouth every 6 (six) hours as needed for severe pain. 07/29/14   Donnie Mesa, MD  zolpidem (AMBIEN) 10 MG tablet Take 5-10 mg by mouth at bedtime as needed. For sleep    Historical Provider, MD   BP 155/103 mmHg  Pulse 81  Temp(Src) 98.5 F (36.9 C) (Oral)  Resp 16  SpO2 94% Physical Exam  Nursing note and vitals reviewed.  50 year old male, resting comfortably and in no acute distress. Vital signs are significant for hypertension. Oxygen saturation is 94%, which is normal. Head is normocephalic and atraumatic. PERRLA, EOMI. Oropharynx is clear. Neck is nontender and supple without  adenopathy or JVD. Back is nontender and there is no CVA tenderness. Lungs are clear without rales, wheezes, or rhonchi. Chest is nontender. Heart has regular rate and rhythm without murmur. Abdomen is soft, flat, nontender without masses or hepatosplenomegaly and peristalsis is normoactive. Extremities have no cyanosis or edema, full range of motion is present. Skin is warm and dry without rash. Neurologic: Mental status is normal, cranial nerves are intact, there are no motor or sensory deficits. Dizziness is reproduced by passive head movement. There is moderate horizontal nystagmus on lateral gaze and dizziness is reproduced with lateral gaze.  ED Course  Procedures (including critical care time) Labs Review Results for orders placed or  performed during the hospital encounter of [id number]  Basic metabolic panel  Result Value Ref Range   Sodium 137 135 - 145 mmol/L   Potassium 3.9 3.5 - 5.1 mmol/L   Chloride 102 101 - 111 mmol/L   CO2 23 22 - 32 mmol/L   Glucose, Bld 109 (H) 65 - 99 mg/dL   BUN 17 6 - 20 mg/dL   Creatinine, Ser 1.59 (H) 0.61 - 1.24 mg/dL   Calcium 9.6 8.9 - 10.3 mg/dL   GFR calc non Af Amer 50 (L) >60 mL/min   GFR calc Af Amer 58 (L) >60 mL/min   Anion gap 12 5 - 15  CBC  Result Value Ref Range   WBC 7.7 4.0 - 10.5 K/uL   RBC 5.50 4.22 - 5.81 MIL/uL   Hemoglobin 17.3 (H) 13.0 - 17.0 g/dL   HCT 48.5 39.0 - 52.0 %   MCV 88.2 78.0 - 100.0 fL   MCH 31.5 26.0 - 34.0 pg   MCHC 35.7 30.0 - 36.0 g/dL   RDW 12.7 11.5 - 15.5 %   Platelets 158 150 - 400 K/uL  I-stat troponin, ED (not at [facility], [facility])  Result Value Ref Range   Troponin i, poc 0.00 0.00 - 0.08 ng/mL   Comment 3           Imaging Review Dg Chest 2 View  12/20/2015  CLINICAL DATA:  Not feeling well, lethargic, dizziness, chest pain and headache intermittent. EXAM: CHEST  2 VIEW COMPARISON:  Chest x-ray dated 11/09/2013. FINDINGS: The heart size and mediastinal contours are within normal limits. Both  lungs are clear. The visualized skeletal structures are unremarkable. IMPRESSION: No active cardiopulmonary disease. Electronically Signed   By: Franki Cabot M.D.   On: 12/20/2015 18:35   I have personally reviewed and evaluated these images and lab results as part of my medical decision-making.   EKG Interpretation   Date/Time:  Wednesday December 20 2015 17:28:21 EST Ventricular Rate:  81 PR Interval:  166 QRS Duration: 108 QT Interval:  380 QTC Calculation: 441 R Axis:   60 Text Interpretation:  Normal sinus rhythm Nonspecific ST abnormality  Abnormal ECG When compared with ECG of 11/09/2013, Nonspecific ST  abnormality is now Present Confirmed by [facility]  MD, Katey Barrie ([id number]) on  12/20/2015 8:45:18 PM      MDM   Final diagnoses:  Labyrinthitis, unspecified laterality  Atypical chest pain  Renal insufficiency    Symptom complex seems most consistent with a viral illness with labyrinthitis and atypical chest pain. Although there are some nonspecific ST changes are present, symptoms do not sound cardiac. Renal insufficiency is noted and he might be somewhat dehydrated. He also has a significant increase in his hemoglobin which would go along with dehydration. He'll be given IV fluids as well as ondansetron and meclizine and reassessed. He is also given a dose of aspirin.  Following above-noted treatment, he felt considerably better. He was able to walk to the bathroom without feeling swimmy-headed. He is discharged with prescription for meclizine and ondansetron and referred back to his PCP to repeat creatinine in 1-2 weeks. He is concerned about his ECG and is referred to cardiology for follow-up.  Delora Fuel, MD [id number]

## 2015-12-20 NOTE — ED Notes (Signed)
Pt reports he has been having CP for two days and the pain radiates down his left arm. He went to urgent care today and was told his EKG was abnormal, "they said it looked like I had a heart attack in the past."

## 2015-12-20 NOTE — Discharge Instructions (Signed)
Drink plenty of fluids. Take acetaminophen as needed for pain. Do not take ibuprofen, because it can harm your kidneys.  Today, blood test of your kidney was a little high - creatinine 1.59 and BUN 17. Please see your doctor in 1-2 weeks to repeat those tests.  Regarding your EKG, make an appointment with the cardiologist.  Vertigo Vertigo means you feel like you or your surroundings are moving when they are not. Vertigo can be dangerous if it occurs when you are at work, driving, or performing difficult activities.  CAUSES  Vertigo occurs when there is a conflict of signals sent to your brain from the visual and sensory systems in your body. There are many different causes of vertigo, including:  Infections, especially in the inner ear.  A bad reaction to a drug or misuse of alcohol and medicines.  Withdrawal from drugs or alcohol.  Rapidly changing positions, such as lying down or rolling over in bed.  A migraine headache.  Decreased blood flow to the brain.  Increased pressure in the brain from a head injury, infection, tumor, or bleeding. SYMPTOMS  You may feel as though the world is spinning around or you are falling to the ground. Because your balance is upset, vertigo can cause nausea and vomiting. You may have involuntary eye movements (nystagmus). DIAGNOSIS  Vertigo is usually diagnosed by physical exam. If the cause of your vertigo is unknown, your caregiver may perform imaging tests, such as an MRI scan (magnetic resonance imaging). TREATMENT  Most cases of vertigo resolve on their own, without treatment. Depending on the cause, your caregiver may prescribe certain medicines. If your vertigo is related to body position issues, your caregiver may recommend movements or procedures to correct the problem. In rare cases, if your vertigo is caused by certain inner ear problems, you may need surgery. HOME CARE INSTRUCTIONS   Follow your caregiver's instructions.  Avoid  driving.  Avoid operating heavy machinery.  Avoid performing any tasks that would be dangerous to you or others during a vertigo episode.  Tell your caregiver if you notice that certain medicines seem to be causing your vertigo. Some of the medicines used to treat vertigo episodes can actually make them worse in some people. SEEK IMMEDIATE MEDICAL CARE IF:   Your medicines do not relieve your vertigo or are making it worse.  You develop problems with talking, walking, weakness, or using your arms, hands, or legs.  You develop severe headaches.  Your nausea or vomiting continues or gets worse.  You develop visual changes.  A family member notices behavioral changes.  Your condition gets worse. MAKE SURE YOU:  Understand these instructions.  Will watch your condition.  Will get help right away if you are not doing well or get worse.   This information is not intended to replace advice given to you by your health care provider. Make sure you discuss any questions you have with your health care provider.   Document Released: 08/28/2005 Document Revised: 02/10/2012 Document Reviewed: 03/13/2015 Elsevier Interactive Patient Education 2016 [employer]  Nonspecific Chest Pain  Chest pain can be caused by many different conditions. There is always a chance that your pain could be related to something serious, such as a heart attack or a blood clot in your lungs. Chest pain can also be caused by conditions that are not life-threatening. If you have chest pain, it is very important to follow up with your health care provider. CAUSES  Chest pain can be  caused by:  Heartburn.  Pneumonia or bronchitis.  Anxiety or stress.  Inflammation around your heart (pericarditis) or lung (pleuritis or pleurisy).  A blood clot in your lung.  A collapsed lung (pneumothorax). It can develop suddenly on its own (spontaneous pneumothorax) or from trauma to the chest.  Shingles infection  (varicella-zoster virus).  Heart attack.  Damage to the bones, muscles, and cartilage that make up your chest wall. This can include:  Bruised bones due to injury.  Strained muscles or cartilage due to frequent or repeated coughing or overwork.  Fracture to one or more ribs.  Sore cartilage due to inflammation (costochondritis). RISK FACTORS  Risk factors for chest pain may include:  Activities that increase your risk for trauma or injury to your chest.  Respiratory infections or conditions that cause frequent coughing.  Medical conditions or overeating that can cause heartburn.  Heart disease or family history of heart disease.  Conditions or health behaviors that increase your risk of developing a blood clot.  Having had chicken pox (varicella zoster). SIGNS AND SYMPTOMS Chest pain can feel like:  Burning or tingling on the surface of your chest or deep in your chest.  Crushing, pressure, aching, or squeezing pain.  Dull or sharp pain that is worse when you move, cough, or take a deep breath.  Pain that is also felt in your back, neck, shoulder, or arm, or pain that spreads to any of these areas. Your chest pain may come and go, or it may stay constant. DIAGNOSIS Lab tests or other studies may be needed to find the cause of your pain. Your health care provider may have you take a test called an ambulatory ECG (electrocardiogram). An ECG records your heartbeat patterns at the time the test is performed. You may also have other tests, such as:  Transthoracic echocardiogram (TTE). During echocardiography, sound waves are used to create a picture of all of the heart structures and to look at how blood flows through your heart.  Transesophageal echocardiogram (TEE).This is a more advanced imaging test that obtains images from inside your body. It allows your health care provider to see your heart in finer detail.  Cardiac monitoring. This allows your health care provider to  monitor your heart rate and rhythm in real time.  Holter monitor. This is a portable device that records your heartbeat and can help to diagnose abnormal heartbeats. It allows your health care provider to track your heart activity for several days, if needed.  Stress tests. These can be done through exercise or by taking medicine that makes your heart beat more quickly.  Blood tests.  Imaging tests. TREATMENT  Your treatment depends on what is causing your chest pain. Treatment may include:  Medicines. These may include:  Acid blockers for heartburn.  Anti-inflammatory medicine.  Pain medicine for inflammatory conditions.  Antibiotic medicine, if an infection is present.  Medicines to dissolve blood clots.  Medicines to treat coronary artery disease.  Supportive care for conditions that do not require medicines. This may include:  Resting.  Applying heat or cold packs to injured areas.  Limiting activities until pain decreases. HOME CARE INSTRUCTIONS  If you were prescribed an antibiotic medicine, finish it all even if you start to feel better.  Avoid any activities that bring on chest pain.  Do not use any tobacco products, including cigarettes, chewing tobacco, or electronic cigarettes. If you need help quitting, ask your health care provider.  Do not drink alcohol.  Take  medicines only as directed by your health care provider.  Keep all follow-up visits as directed by your health care provider. This is important. This includes any further testing if your chest pain does not go away.  If heartburn is the cause for your chest pain, you may be told to keep your head raised (elevated) while sleeping. This reduces the chance that acid will go from your stomach into your esophagus.  Make lifestyle changes as directed by your health care provider. These may include:  Getting regular exercise. Ask your health care provider to suggest some activities that are safe for  you.  Eating a heart-healthy diet. A registered dietitian can help you to learn healthy eating options.  Maintaining a healthy weight.  Managing diabetes, if necessary.  Reducing stress. SEEK MEDICAL CARE IF:  Your chest pain does not go away after treatment.  You have a rash with blisters on your chest.  You have a fever. SEEK IMMEDIATE MEDICAL CARE IF:   Your chest pain is worse.  You have an increasing cough, or you cough up blood.  You have severe abdominal pain.  You have severe weakness.  You faint.  You have chills.  You have sudden, unexplained chest discomfort.  You have sudden, unexplained discomfort in your arms, back, neck, or jaw.  You have shortness of breath at any time.  You suddenly start to sweat, or your skin gets clammy.  You feel nauseous or you vomit.  You suddenly feel light-headed or dizzy.  Your heart begins to beat quickly, or it feels like it is skipping beats. These symptoms may represent a serious problem that is an emergency. Do not wait to see if the symptoms will go away. Get medical help right away. Call your local emergency services (911 in the [place] Do not drive yourself to the hospital.   This information is not intended to replace advice given to you by your health care provider. Make sure you discuss any questions you have with your health care provider.   Document Released: 08/28/2005 Document Revised: 12/09/2014 Document Reviewed: 06/24/2014 Elsevier Interactive Patient Education 2016 [place] tablets or capsules What is this medicine? MECLIZINE (MEK li zeen) is an antihistamine. It is used to prevent nausea, vomiting, or dizziness caused by motion sickness. It is also used to prevent and treat vertigo (extreme dizziness or a feeling that you or your surroundings are tilting or spinning around). This medicine may be used for other purposes; ask your health care provider or pharmacist if you have  questions. What should I tell my health care provider before I take this medicine? They need to know if you have any of these conditions: -glaucoma -lung or breathing disease, like asthma -problems urinating -prostate disease -stomach or intestine problems -an unusual or allergic reaction to meclizine, other medicines, foods, dyes, or preservatives -pregnant or trying to get pregnant -breast-feeding How should I use this medicine? Take this medicine by mouth with a glass of water. Follow the directions on the prescription label. If you are using this medicine to prevent motion sickness, take the dose at least 1 hour before travel. If it upsets your stomach, take it with food or milk. Take your doses at regular intervals. Do not take your medicine more often than directed. Talk to your pediatrician regarding the use of this medicine in children. Special care may be needed. Overdosage: If you think you have taken too much of this medicine contact a poison control center  or emergency room at once. NOTE: This medicine is only for you. Do not share this medicine with others. What if I miss a dose? If you miss a dose, take it as soon as you can. If it is almost time for your next dose, take only that dose. Do not take double or extra doses. What may interact with this medicine? Do not take this medicine with any of the following medications: -MAOIs like Carbex, Eldepryl, Marplan, Nardil, and Parnate This medicine may also interact with the following medications: -alcohol -antihistamines for allergy, cough and cold -certain medicines for anxiety or sleep -certain medicines for depression, like amitriptyline, fluoxetine, sertraline -certain medicines for seizures like phenobarbital, primidone -general anesthetics like halothane, isoflurane, methoxyflurane, propofol -local anesthetics like lidocaine, pramoxine, tetracaine -medicines that relax muscles for surgery -narcotic medicines for  pain -phenothiazines like chlorpromazine, mesoridazine, prochlorperazine, thioridazine This list may not describe all possible interactions. Give your health care provider a list of all the medicines, herbs, non-prescription drugs, or dietary supplements you use. Also tell them if you smoke, drink alcohol, or use illegal drugs. Some items may interact with your medicine. What should I watch for while using this medicine? Tell your doctor or healthcare professional if your symptoms do not start to get better or if they get worse. You may get drowsy or dizzy. Do not drive, use machinery, or do anything that needs mental alertness until you know how this medicine affects you. Do not stand or sit up quickly, especially if you are an older patient. This reduces the risk of dizzy or fainting spells. Alcohol may interfere with the effect of this medicine. Avoid alcoholic drinks. Your mouth may get dry. Chewing sugarless gum or sucking hard candy, and drinking plenty of water may help. Contact your doctor if the problem does not go away or is severe. This medicine may cause dry eyes and blurred vision. If you wear contact lenses you may feel some discomfort. Lubricating drops may help. See your eye doctor if the problem does not go away or is severe. What side effects may I notice from receiving this medicine? Side effects that you should report to your doctor or health care professional as soon as possible: -feeling faint or lightheaded, falls -fast, irregular heartbeat Side effects that usually do not require medical attention (report these to your doctor or health care professional if they continue or are bothersome): -constipation -headache -trouble passing urine or change in the amount of urine -trouble sleeping -upset stomach This list may not describe all possible side effects. Call your doctor for medical advice about side effects. You may report side effects to FDA at 1-800-FDA-1088. Where should  I keep my medicine? Keep out of the reach of children. Store at room temperature between 15 and 30 degrees C (59 and 86 degrees F). Keep container tightly closed. Throw away any unused medicine after the expiration date. NOTE: This sheet is a summary. It may not cover all possible information. If you have questions about this medicine, talk to your doctor, pharmacist, or health care provider.    2016, Else[employer]. (2015-05-25 09:20:57)  Ondansetron tablets What is this medicine? ONDANSETRON (on DAN se tron) is used to treat nausea and vomiting caused by chemotherapy. It is also used to prevent or treat nausea and vomiting after surgery. This medicine may be used for other purposes; ask your health care provider or pharmacist if you have questions. What should I tell my health care provider before I take  this medicine? They need to know if you have any of these conditions: -heart disease -history of irregular heartbeat -liver disease -low levels of magnesium or potassium in the blood -an unusual or allergic reaction to ondansetron, granisetron, other medicines, foods, dyes, or preservatives -pregnant or trying to get pregnant -breast-feeding How should I use this medicine? Take this medicine by mouth with a glass of water. Follow the directions on your prescription label. Take your doses at regular intervals. Do not take your medicine more often than directed. Talk to your pediatrician regarding the use of this medicine in children. Special care may be needed. Overdosage: If you think you have taken too much of this medicine contact a poison control center or emergency room at once. NOTE: This medicine is only for you. Do not share this medicine with others. What if I miss a dose? If you miss a dose, take it as soon as you can. If it is almost time for your next dose, take only that dose. Do not take double or extra doses. What may interact with this medicine? Do not take this  medicine with any of the following medications: -apomorphine -certain medicines for fungal infections like fluconazole, itraconazole, ketoconazole, posaconazole, voriconazole -cisapride -dofetilide -dronedarone -pimozide -thioridazine -ziprasidone This medicine may also interact with the following medications: -carbamazepine -certain medicines for depression, anxiety, or psychotic disturbances -fentanyl -linezolid -MAOIs like Carbex, Eldepryl, Marplan, Nardil, and Parnate -methylene blue (injected into a vein) -other medicines that prolong the QT interval (cause an abnormal heart rhythm) -phenytoin -rifampicin -tramadol This list may not describe all possible interactions. Give your health care provider a list of all the medicines, herbs, non-prescription drugs, or dietary supplements you use. Also tell them if you smoke, drink alcohol, or use illegal drugs. Some items may interact with your medicine. What should I watch for while using this medicine? Check with your doctor or health care professional right away if you have any sign of an allergic reaction. What side effects may I notice from receiving this medicine? Side effects that you should report to your doctor or health care professional as soon as possible: -allergic reactions like skin rash, itching or hives, swelling of the face, lips or tongue -breathing problems -confusion -dizziness -fast or irregular heartbeat -feeling faint or lightheaded, falls -fever and chills -loss of balance or coordination -seizures -sweating -swelling of the hands or feet -tightness in the chest -tremors -unusually weak or tired Side effects that usually do not require medical attention (report to your doctor or health care professional if they continue or are bothersome): -constipation or diarrhea -headache This list may not describe all possible side effects. Call your doctor for medical advice about side effects. You may report side  effects to FDA at 1-800-FDA-1088. Where should I keep my medicine? Keep out of the reach of children. Store between 2 and 30 degrees C (36 and 86 degrees F). Throw away any unused medicine after the expiration date. NOTE: This sheet is a summary. It may not cover all possible information. If you have questions about this medicine, talk to your doctor, pharmacist, or health care provider.    2016, [employer]. (2013-08-25 16:27:45)

## 2016-10-22 ENCOUNTER — Emergency Department (HOSPITAL_BASED_OUTPATIENT_CLINIC_OR_DEPARTMENT_OTHER)
Admission: EM | Admit: 2016-10-22 | Discharge: 2016-10-22 | Disposition: A | Discharge status: Home / Self Care | Payer: Self-pay | Attending: Emergency Medicine | Admitting: Emergency Medicine

## 2016-10-22 ENCOUNTER — Encounter (HOSPITAL_BASED_OUTPATIENT_CLINIC_OR_DEPARTMENT_OTHER): Payer: Self-pay | Admitting: Emergency Medicine

## 2016-10-22 DIAGNOSIS — M5431 Sciatica, right side: Secondary | ICD-10-CM

## 2016-10-22 DIAGNOSIS — B370 Candidal stomatitis: Secondary | ICD-10-CM

## 2016-10-22 DIAGNOSIS — E039 Hypothyroidism, unspecified: Secondary | ICD-10-CM | POA: Insufficient documentation

## 2016-10-22 DIAGNOSIS — I10 Essential (primary) hypertension: Secondary | ICD-10-CM | POA: Insufficient documentation

## 2016-10-22 DIAGNOSIS — B379 Candidiasis, unspecified: Secondary | ICD-10-CM | POA: Insufficient documentation

## 2016-10-22 DIAGNOSIS — M5441 Lumbago with sciatica, right side: Secondary | ICD-10-CM | POA: Insufficient documentation

## 2016-10-22 DIAGNOSIS — Z79899 Other long term (current) drug therapy: Secondary | ICD-10-CM | POA: Insufficient documentation

## 2016-10-22 MED ORDER — CYCLOBENZAPRINE HCL 10 MG PO TABS: 10.0000 mg | ORAL_TABLET | Freq: Three times a day (TID) | ORAL | 0 refills | Status: DC | PRN

## 2016-10-22 MED ORDER — PREDNISONE 50 MG PO TABS: 50.0000 mg | ORAL_TABLET | Freq: Every day | ORAL | 0 refills | Status: DC

## 2016-10-22 MED ORDER — NYSTATIN 100000 UNIT/ML MT SUSP: OROMUCOSAL | 0 refills | Status: DC

## 2016-10-22 MED ORDER — HYDROCODONE-ACETAMINOPHEN 5-325 MG PO TABS: 1.0000 | ORAL_TABLET | Freq: Four times a day (QID) | ORAL | 0 refills | Status: DC | PRN

## 2016-10-22 NOTE — Discharge Instructions (Signed)
Return here as needed.  Follow-up with the, Dr. provided along with your primary doctor

## 2016-10-22 NOTE — ED Provider Notes (Signed)
[facility] DEPT [facility] Provider Note   CSN: [id number] Arrival date & time: 10/22/16  1631  By signing my name below, I, Douglas Wiggins, attest that this documentation has been prepared under the direction and in the presence of [employer], PA-C. Electronically Signed: Judithann Sauger, ED Scribe. 10/22/16. 6:52 PM.   History   Chief Complaint Chief Complaint  Patient presents with  . Hip Pain    right    HPI Comments: Douglas Wiggins is a 50 y.o. male with a hx or hypertension, chronic lower back pain, and gout who presents to the Emergency Department complaining of and acute exacerbation of his chronic right lower back pain onset 3 days ago. He reports associated moderate right lower back pain that radiates to his right hip and down his posterior right leg. He denies any recent injuries, trauma, or injuries to his back. No alleviating factors noted. Pt states that he has tried aleve, ibuprofen, mobic, and gabapentin with no relief. Pt has an allergy to sulfa antibiotics. He states that he has been evaluated by a chiropractor who informed him of multiple herniated discs. He denies any fever, chills, vomiting, bladder/bowel incontinence, saddle anesthesia, numbness/tingling in BLE, or weakness.   He also c/o dental problem he describes as burning with white patches on his gum and tongue onset several months ago. He states that he has been evaluated by a provider who informed him that he has a yeast infection in his mouth. He adds that he has tried magic mouthwash and other OTC mouth washes with no relief. He denies any drainage in mouth. No other complaints at this time.   The history is provided by the patient. No language interpreter was used.    Past Medical History:  Diagnosis Date  . Arthritis    "I'm eat up w/it" (11/10/2013)  . Blood in stool   . Chronic lower back pain   . Depression   . Diverticulitis   . GERD (gastroesophageal reflux disease)   . Gout   . H/O  hiatal hernia   . Head injury, closed 12/06/1990  . Headache(784.0)    "used to keep them; sinus allergies; more controlled now" (11/10/2013)  . High cholesterol   . History of blood transfusion 2013  . Hypertension    EKG 01/16/12 EPIC  . Hypothyroidism   . Lichen planus   . Migraines    "rarely anymore" (11/10/2013)  . Pneumonia   . Sleep apnea    does not wear cpap/states MILD- no sleep study in years  . Thyroid disease     Patient Active Problem List   Diagnosis Date Noted  . Ventral hernia 11/10/2013  . Recurrent ventral incisional hernia 08/26/2013  . Disruption of wound, unspecified 03/01/2013  . Respiratory failure, post-operative ([place]) 10/28/2012  . Acute renal insufficiency, nonoliguric 10/28/2012  . Status post laparotomy, evac of hemoperitoneum and wound vac placement 11/26 10/28/2012  .  Ex lap with partial colectomy with revision of colon anastomosis, repair of ventral hernia 11/25 10/28/2012  . Encephalopathy acute 10/27/2012  . Abdominal compartment syndrome, post lap 11/26 10/27/2012  . Shock due to abdominal compartment syndrome and acute blood loss 10/27/2012  . Hemoperitoneum, resolved post lap 11/26 10/27/2012  . H/O Hypertension 01/06/2012  . Hypothyroid 01/06/2012    Past Surgical History:  Procedure Laterality Date  . ANKLE ARTHROSCOPY Right    with excision and scrapping of bone  . APPLICATION OF WOUND VAC  10/29/2012   Procedure: APPLICATION OF WOUND  VAC;  Surgeon: Imogene Burn. Georgette Dover, MD;  Location: [facility];  Service: General;;  . CARPAL TUNNEL RELEASE Left 1990's  . COLON RESECTION  10/26/2012   Procedure: COLON RESECTION;  Surgeon: Imogene Burn. Georgette Dover, MD;  Location: [facility];  Service: General;  Laterality: N/A;  Partial colon resection with anastomosis  . COLON SURGERY  01/2012  . COLOSTOMY CLOSURE  04/13/2012   Procedure: COLOSTOMY CLOSURE;  Surgeon: Imogene Burn. Georgette Dover, MD;  [facility];  Service: General;  Laterality: N/A;  . COLOSTOMY REVISION   01/06/2012   Procedure: COLON RESECTION SIGMOID;  Surgeon: Imogene Burn. Georgette Dover, MD;  [facility];  Service: General;  Laterality: N/A;  with hartmans procedure  . HERNIA REPAIR  10/26/2012; 11/10/2013   VHR; [place] w/mesh (11/10/2013)  . INSERTION OF MESH N/A 11/10/2013   Procedure: INSERTION OF MESH;  Surgeon: Imogene Burn. Georgette Dover, MD;  Location: [facility];  Service: General;  Laterality: N/A;  . KNEE ARTHROSCOPY Right 1990's  . LAPAROTOMY  10/26/2012   Procedure: EXPLORATORY LAPAROTOMY;  Surgeon: Imogene Burn. Georgette Dover, MD;  Location: [facility];  Service: General;  Laterality: N/A;  . LAPAROTOMY  10/27/2012   Procedure: EXPLORATORY LAPAROTOMY;  Surgeon: Zenovia Jarred, MD;  Location: [facility];  Service: General;  Laterality: N/A;  . LAPAROTOMY  10/29/2012   Procedure: EXPLORATORY LAPAROTOMY;  Surgeon: Imogene Burn. Georgette Dover, MD;  Location: [facility];  Service: General;  Laterality: N/A;  exploratory laparotomy, partial closure of abdominal wound,    . REATTACHMENT HAND Left 2000's   "pinky" (11/10/2013)  . SHOULDER ARTHROSCOPY Right 1990's   "spurs and arthritis scraped off" (11/10/2013)  . VENTRAL HERNIA REPAIR  10/26/2012   Procedure: HERNIA REPAIR VENTRAL ADULT;  Surgeon: Imogene Burn. Georgette Dover, MD;  Location: [facility];  Service: General;  Laterality: N/A;  Primary Repair Ventral Hernia  . VENTRAL HERNIA REPAIR N/A 11/10/2013   Procedure: LAPAROSCOPIC VENTRAL HERNIA ;  Surgeon: Imogene Burn. Georgette Dover, MD;  Location: [facility];  Service: General;  Laterality: N/A;       Home Medications    Prior to Admission medications   Medication Sig Start Date End Date Taking? Authorizing Provider  DULoxetine (CYMBALTA) 60 MG capsule Take 60 mg by mouth daily. 11/13/15  Yes Historical Provider, MD  gabapentin (NEURONTIN) 600 MG tablet Take 1 tablet (600 mg total) by mouth 2 (two) times daily. 01/31/14  Yes Donnie Mesa, MD  levothyroxine (SYNTHROID, LEVOTHROID) 100 MCG tablet Take 100 mcg by mouth daily before breakfast.   Yes Historical  Provider, MD  losartan (COZAAR) 100 MG tablet Take 100 mg by mouth daily.  01/03/12  Yes Historical Provider, MD  meloxicam (MOBIC) 15 MG tablet Take 15 mg by mouth daily. Reported on 12/20/2015   Yes Historical Provider, MD  meclizine (ANTIVERT) 25 MG tablet Take 1 tablet (25 mg total) by mouth 3 (three) times daily as needed for dizziness. 123456   Delora Fuel, MD  ondansetron (ZOFRAN) 4 MG tablet Take 1 tablet (4 mg total) by mouth every 6 (six) hours as needed for nausea or vomiting. [id number]   Delora Fuel, MD    Family History History reviewed. No pertinent family history.  Social History Social History  Substance Use Topics  . Smoking status: Never Smoker  . Smokeless tobacco: Never Used  . Alcohol use Yes     Comment: 11/10/2013 "drink of beer once in a blue moon; used to drink too much as a teen"     Allergies   Sulfa  antibiotics   Review of Systems Review of Systems  A complete 10 system review of systems was obtained and all systems are negative except as noted in the HPI and PMH.   Physical Exam Updated Vital Signs BP (!) 158/117 (BP Location: Right Arm)   Pulse 74   Temp 99 F (37.2 C) (Oral)   Resp 18   Ht 6\' 4"  (1.93 m)   Wt 270 lb (122.5 kg)   SpO2 99%   BMI 32.87 kg/m   Physical Exam  Constitutional: He is oriented to person, place, and time. He appears well-developed and well-nourished.  HENT:  Head: Normocephalic and atraumatic.  White coating to tongue and white plaque to teeth/gum  Pulmonary/Chest: Effort normal.  Musculoskeletal: He exhibits tenderness.  Right lower back TTP Good strength and sensation   Neurological: He is alert and oriented to person, place, and time.  Skin: Skin is warm and dry.  Psychiatric: He has a normal mood and affect.  Nursing note and vitals reviewed.    ED Treatments / Results  DIAGNOSTIC STUDIES: Oxygen Saturation is 99% on RA, normal by my interpretation.    COORDINATION OF CARE: 6:34 PM- Pt advised of  plan for treatment and pt agrees. Pt will receive prednisone, vicodin, flexeril, and mycostatin.    Labs (all labs ordered are listed, but only abnormal results are displayed) Labs Reviewed - No data to display  EKG  EKG Interpretation None       Radiology No results found.  Procedures Procedures (including critical care time)  Medications Ordered in ED Medications - No data to display   Initial Impression / Assessment and Plan / ED Course  Irena Cords, PA-C has reviewed the triage vital signs and the nursing notes.  Pertinent labs & imaging results that were available during my care of the patient were reviewed by me and considered in my medical decision making (see chart for details).  Clinical Course     Patient be treated for sciatica, referred to neurosurgery.  Told to return here as needed.  Patient agrees the plan and all questions were answered  Final Clinical Impressions(s) / ED Diagnoses   Final diagnoses:  None    New Prescriptions New Prescriptions   No medications on file   I personally performed the services described in this documentation, which was scribed in my presence. The recorded information has been reviewed and is accurate.   Dalia Heading, PA-C 10/24/16 [place], MD 10/24/16 [phone]

## 2016-10-22 NOTE — ED Notes (Signed)
Pt. Able to walk and urinate and have BMs with no difficulty.

## 2016-10-22 NOTE — ED Triage Notes (Signed)
Patient c/o constant chronic right hip pain. Patient has taken aleve, and ibuprofen, mobic, and gabapentin without relief. Patient states he also takes "what ever pain pills I can get or find". Patient denies any recent injuries to cause a worsening in pain.

## 2017-05-02 DIAGNOSIS — Z8619 Personal history of other infectious and parasitic diseases: Secondary | ICD-10-CM

## 2017-05-02 HISTORY — DX: Personal history of other infectious and parasitic diseases: Z86.19

## 2017-05-23 ENCOUNTER — Encounter (HOSPITAL_BASED_OUTPATIENT_CLINIC_OR_DEPARTMENT_OTHER): Payer: Self-pay

## 2017-05-23 ENCOUNTER — Emergency Department (HOSPITAL_BASED_OUTPATIENT_CLINIC_OR_DEPARTMENT_OTHER): Payer: Self-pay

## 2017-05-23 ENCOUNTER — Inpatient Hospital Stay (HOSPITAL_BASED_OUTPATIENT_CLINIC_OR_DEPARTMENT_OTHER)
Admission: EM | Admit: 2017-05-23 | Discharge: 2017-05-26 | DRG: 871 | Disposition: A | Discharge status: Home / Self Care | Payer: Self-pay | Attending: Internal Medicine | Admitting: Internal Medicine

## 2017-05-23 DIAGNOSIS — G9340 Encephalopathy, unspecified: Secondary | ICD-10-CM

## 2017-05-23 DIAGNOSIS — I959 Hypotension, unspecified: Secondary | ICD-10-CM | POA: Diagnosis present

## 2017-05-23 DIAGNOSIS — A419 Sepsis, unspecified organism: Principal | ICD-10-CM | POA: Diagnosis present

## 2017-05-23 DIAGNOSIS — I248 Other forms of acute ischemic heart disease: Secondary | ICD-10-CM | POA: Diagnosis present

## 2017-05-23 DIAGNOSIS — E039 Hypothyroidism, unspecified: Secondary | ICD-10-CM | POA: Diagnosis present

## 2017-05-23 DIAGNOSIS — R7989 Other specified abnormal findings of blood chemistry: Secondary | ICD-10-CM | POA: Diagnosis present

## 2017-05-23 DIAGNOSIS — Z7952 Long term (current) use of systemic steroids: Secondary | ICD-10-CM

## 2017-05-23 DIAGNOSIS — I10 Essential (primary) hypertension: Secondary | ICD-10-CM | POA: Diagnosis present

## 2017-05-23 DIAGNOSIS — I639 Cerebral infarction, unspecified: Secondary | ICD-10-CM

## 2017-05-23 DIAGNOSIS — N179 Acute kidney failure, unspecified: Secondary | ICD-10-CM | POA: Diagnosis present

## 2017-05-23 DIAGNOSIS — K219 Gastro-esophageal reflux disease without esophagitis: Secondary | ICD-10-CM | POA: Diagnosis present

## 2017-05-23 DIAGNOSIS — G9341 Metabolic encephalopathy: Secondary | ICD-10-CM | POA: Diagnosis present

## 2017-05-23 DIAGNOSIS — G4733 Obstructive sleep apnea (adult) (pediatric): Secondary | ICD-10-CM | POA: Diagnosis present

## 2017-05-23 DIAGNOSIS — F1910 Other psychoactive substance abuse, uncomplicated: Secondary | ICD-10-CM | POA: Diagnosis present

## 2017-05-23 DIAGNOSIS — M1990 Unspecified osteoarthritis, unspecified site: Secondary | ICD-10-CM | POA: Diagnosis present

## 2017-05-23 DIAGNOSIS — R011 Cardiac murmur, unspecified: Secondary | ICD-10-CM | POA: Diagnosis present

## 2017-05-23 DIAGNOSIS — E785 Hyperlipidemia, unspecified: Secondary | ICD-10-CM | POA: Diagnosis present

## 2017-05-23 DIAGNOSIS — R197 Diarrhea, unspecified: Secondary | ICD-10-CM | POA: Diagnosis present

## 2017-05-23 DIAGNOSIS — F329 Major depressive disorder, single episode, unspecified: Secondary | ICD-10-CM | POA: Diagnosis present

## 2017-05-23 DIAGNOSIS — B370 Candidal stomatitis: Secondary | ICD-10-CM | POA: Diagnosis present

## 2017-05-23 DIAGNOSIS — Z882 Allergy status to sulfonamides status: Secondary | ICD-10-CM

## 2017-05-23 DIAGNOSIS — N289 Disorder of kidney and ureter, unspecified: Secondary | ICD-10-CM

## 2017-05-23 DIAGNOSIS — Z79899 Other long term (current) drug therapy: Secondary | ICD-10-CM

## 2017-05-23 DIAGNOSIS — R778 Other specified abnormalities of plasma proteins: Secondary | ICD-10-CM | POA: Diagnosis present

## 2017-05-23 DIAGNOSIS — L439 Lichen planus, unspecified: Secondary | ICD-10-CM | POA: Diagnosis present

## 2017-05-23 LAB — I-STAT VENOUS BLOOD GAS, ED
Acid-base deficit: 3 mmol/L — ABNORMAL HIGH (ref 0.0–2.0)
BICARBONATE: 24.2 mmol/L (ref 20.0–28.0)
O2 Saturation: 35 %
PCO2 VEN: 48.1 mmHg (ref 44.0–60.0)
PH VEN: 7.308 (ref 7.250–7.430)
TCO2: 26 mmol/L (ref 0–100)
pO2, Ven: 23 mmHg — CL (ref 32.0–45.0)

## 2017-05-23 LAB — COMPREHENSIVE METABOLIC PANEL
ALT: 62 U/L (ref 17–63)
ANION GAP: 17 — AB (ref 5–15)
AST: 45 U/L — ABNORMAL HIGH (ref 15–41)
Albumin: 5.2 g/dL — ABNORMAL HIGH (ref 3.5–5.0)
Alkaline Phosphatase: 109 U/L (ref 38–126)
BILIRUBIN TOTAL: 2.2 mg/dL — AB (ref 0.3–1.2)
BUN: 36 mg/dL — ABNORMAL HIGH (ref 6–20)
CALCIUM: 10.3 mg/dL (ref 8.9–10.3)
CO2: 24 mmol/L (ref 22–32)
Chloride: 95 mmol/L — ABNORMAL LOW (ref 101–111)
Creatinine, Ser: 3.23 mg/dL — ABNORMAL HIGH (ref 0.61–1.24)
GFR calc Af Amer: 24 mL/min — ABNORMAL LOW (ref >60)
GFR, EST NON AFRICAN AMERICAN: 21 mL/min — AB (ref >60)
Glucose, Bld: 150 mg/dL — ABNORMAL HIGH (ref 65–99)
POTASSIUM: 3.6 mmol/L (ref 3.5–5.1)
Sodium: 136 mmol/L (ref 135–145)
TOTAL PROTEIN: 8.8 g/dL — AB (ref 6.5–8.1)

## 2017-05-23 LAB — CBC WITH DIFFERENTIAL/PLATELET
BASOS ABS: 0 10*3/uL (ref 0.0–0.1)
BASOS PCT: 0 %
EOS PCT: 0 %
Eosinophils Absolute: 0 10*3/uL (ref 0.0–0.7)
HEMATOCRIT: 53.4 % — AB (ref 39.0–52.0)
Hemoglobin: 19 g/dL — ABNORMAL HIGH (ref 13.0–17.0)
LYMPHS ABS: 2.4 10*3/uL (ref 0.7–4.0)
Lymphocytes Relative: 11 %
MCH: 29.7 pg (ref 26.0–34.0)
MCHC: 35.6 g/dL (ref 30.0–36.0)
MCV: 83.6 fL (ref 78.0–100.0)
Monocytes Absolute: 1.5 10*3/uL — ABNORMAL HIGH (ref 0.1–1.0)
Monocytes Relative: 7 %
NEUTROS ABS: 18 10*3/uL — AB (ref 1.7–7.7)
Neutrophils Relative %: 82 %
Platelets: 272 10*3/uL (ref 150–400)
RBC: 6.39 MIL/uL — ABNORMAL HIGH (ref 4.22–5.81)
RDW: 16.9 % — AB (ref 11.5–15.5)
WBC: 21.9 10*3/uL — ABNORMAL HIGH (ref 4.0–10.5)

## 2017-05-23 LAB — I-STAT CG4 LACTIC ACID, ED
LACTIC ACID, VENOUS: 2.2 mmol/L — AB (ref 0.5–1.9)
Lactic Acid, Venous: 3.37 mmol/L (ref 0.5–1.9)

## 2017-05-23 LAB — ETHANOL: Alcohol, Ethyl (B): <5 mg/dL (ref <5)

## 2017-05-23 LAB — TROPONIN I: TROPONIN I: 0.08 ng/mL — AB (ref <0.03)

## 2017-05-23 LAB — CBG MONITORING, ED: GLUCOSE-CAPILLARY: 175 mg/dL — AB (ref 65–99)

## 2017-05-23 LAB — BRAIN NATRIURETIC PEPTIDE: B NATRIURETIC PEPTIDE 5: 81.3 pg/mL (ref 0.0–100.0)

## 2017-05-23 LAB — CK: Total CK: 357 U/L (ref 49–397)

## 2017-05-23 MED ORDER — VANCOMYCIN HCL IN DEXTROSE 1-5 GM/200ML-% IV SOLN
1000.0000 mg | Freq: Once | INTRAVENOUS | Status: AC
  Administered 2017-05-23: 1000 mg via INTRAVENOUS
  Filled 2017-05-23: qty 200

## 2017-05-23 MED ORDER — DIPHENHYDRAMINE HCL 50 MG/ML IJ SOLN
50.0000 mg | Freq: Once | INTRAMUSCULAR | Status: AC
  Administered 2017-05-23: 50 mg via INTRAVENOUS
  Filled 2017-05-23: qty 1

## 2017-05-23 MED ORDER — PIPERACILLIN-TAZOBACTAM 3.375 G IVPB 30 MIN
3.3750 g | Freq: Once | INTRAVENOUS | Status: AC
  Administered 2017-05-23: 3.375 g via INTRAVENOUS
  Filled 2017-05-23 (×2): qty 50

## 2017-05-23 MED ORDER — VANCOMYCIN HCL 10 G IV SOLR
1500.0000 mg | INTRAVENOUS | Status: DC
  Filled 2017-05-23: qty 1500

## 2017-05-23 MED ORDER — SODIUM CHLORIDE 0.9 % IV BOLUS (SEPSIS)
1000.0000 mL | Freq: Once | INTRAVENOUS | Status: AC
  Administered 2017-05-23: 1000 mL via INTRAVENOUS

## 2017-05-23 MED ORDER — PIPERACILLIN-TAZOBACTAM 3.375 G IVPB
3.3750 g | Freq: Three times a day (TID) | INTRAVENOUS | Status: DC
  Filled 2017-05-23 (×2): qty 50

## 2017-05-23 NOTE — ED Provider Notes (Signed)
[facility] DEPT [facility] Provider Note   CSN: [id number] Arrival date & time: 05/23/17  1907  By signing my name below, I, Reola Mosher, attest that this documentation has been prepared under the direction and in the presence of Iyannah Blake, Fredia Sorrow, MD. Electronically Signed: Reola Mosher, ED Scribe. 05/23/17. 7:49 PM.  History   Chief Complaint Chief Complaint  Patient presents with  . Shortness of Breath   The history is provided by the patient. No language interpreter was used.    HPI Comments: Douglas Wiggins is a 51 y.o. male with a h/o OSA, heart murmur dx'd in November, prior MI ~1 year ago, hypothyroidism, HTN/HLD, diverticulitis, chronic lower back pain, and depression, who presents to the Emergency Department complaining of intermittent episodes of shortness of breath beginning two days ago. Per significant other, pt has been increasingly spending time outside over the past three days mowing his lawn in the sun for long hours. She states that over the past two days, however, that he has been experiencing some episodes of dyspnea while sleeping, upper abdominal tightness, leg weakness with intermittent cramping, and near-syncopal episodes while standing. His significant other states that he has been attempting to stay adequately hydrates while working outside. He also notes some chest discomfort but denies any overt pain. She also states that today while on the phone that he seemed to have some slurred speech and difficulty speaking away from his baseline and difficulty ambulating into their house as well. Wife also reports that he was diagnosed with a heart murmur ~7 months ago with his PCP; he has not followed up since this diagnosis. Wife notes on the side that he does have a h/o prior drug usage and is concerned for possible IVDU. No illicit drug or alcohol usage, per pt. Pt is a current smoker. Pt had a recent colectomy in 2013, with ostomy reversal. He denies chest  pain, fever, or any other associated symptoms.   Past Medical History:  Diagnosis Date  . Arthritis    "I'm eat up w/it" (11/10/2013)  . Blood in stool   . Chronic lower back pain   . Depression   . Diverticulitis   . GERD (gastroesophageal reflux disease)   . Gout   . H/O hiatal hernia   . Head injury, closed 12/06/1990  . Headache(784.0)    "used to keep them; sinus allergies; more controlled now" (11/10/2013)  . High cholesterol   . History of blood transfusion 2013  . Hypertension    EKG 01/16/12 EPIC  . Hypothyroidism   . Lichen planus   . Migraines    "rarely anymore" (11/10/2013)  . Pneumonia   . Sleep apnea    does not wear cpap/states MILD- no sleep study in years  . Thyroid disease    Patient Active Problem List   Diagnosis Date Noted  . Ventral hernia 11/10/2013  . Recurrent ventral incisional hernia 08/26/2013  . Disruption of wound, unspecified 03/01/2013  . Respiratory failure, post-operative ([place]) 10/28/2012  . Acute renal insufficiency, nonoliguric 10/28/2012  . Status post laparotomy, evac of hemoperitoneum and wound vac placement 11/26 10/28/2012  .  Ex lap with partial colectomy with revision of colon anastomosis, repair of ventral hernia 11/25 10/28/2012  . Encephalopathy acute 10/27/2012  . Abdominal compartment syndrome, post lap 11/26 10/27/2012  . Shock due to abdominal compartment syndrome and acute blood loss 10/27/2012  . Hemoperitoneum, resolved post lap 11/26 10/27/2012  . H/O Hypertension 01/06/2012  . Hypothyroid 01/06/2012  Past Surgical History:  Procedure Laterality Date  . ANKLE ARTHROSCOPY Right    with excision and scrapping of bone  . APPLICATION OF WOUND VAC  10/29/2012   Procedure: APPLICATION OF WOUND VAC;  Surgeon: Imogene Burn. Georgette Dover, MD;  Location: [facility];  Service: General;;  . CARPAL TUNNEL RELEASE Left 1990's  . COLON RESECTION  10/26/2012   Procedure: COLON RESECTION;  Surgeon: Imogene Burn. Georgette Dover, MD;  Location: [facility];   Service: General;  Laterality: N/A;  Partial colon resection with anastomosis  . COLON SURGERY  01/2012  . COLOSTOMY CLOSURE  04/13/2012   Procedure: COLOSTOMY CLOSURE;  Surgeon: Imogene Burn. Georgette Dover, MD;  [facility];  Service: General;  Laterality: N/A;  . COLOSTOMY REVISION  01/06/2012   Procedure: COLON RESECTION SIGMOID;  Surgeon: Imogene Burn. Georgette Dover, MD;  [facility];  Service: General;  Laterality: N/A;  with hartmans procedure  . HERNIA REPAIR  10/26/2012; 11/10/2013   VHR; [place] w/mesh (11/10/2013)  . INSERTION OF MESH N/A 11/10/2013   Procedure: INSERTION OF MESH;  Surgeon: Imogene Burn. Georgette Dover, MD;  Location: [facility];  Service: General;  Laterality: N/A;  . KNEE ARTHROSCOPY Right 1990's  . LAPAROTOMY  10/26/2012   Procedure: EXPLORATORY LAPAROTOMY;  Surgeon: Imogene Burn. Georgette Dover, MD;  Location: [facility];  Service: General;  Laterality: N/A;  . LAPAROTOMY  10/27/2012   Procedure: EXPLORATORY LAPAROTOMY;  Surgeon: Zenovia Jarred, MD;  Location: [facility];  Service: General;  Laterality: N/A;  . LAPAROTOMY  10/29/2012   Procedure: EXPLORATORY LAPAROTOMY;  Surgeon: Imogene Burn. Georgette Dover, MD;  Location: [facility];  Service: General;  Laterality: N/A;  exploratory laparotomy, partial closure of abdominal wound,    . REATTACHMENT HAND Left 2000's   "pinky" (11/10/2013)  . SHOULDER ARTHROSCOPY Right 1990's   "spurs and arthritis scraped off" (11/10/2013)  . VENTRAL HERNIA REPAIR  10/26/2012   Procedure: HERNIA REPAIR VENTRAL ADULT;  Surgeon: Imogene Burn. Georgette Dover, MD;  Location: [facility];  Service: General;  Laterality: N/A;  Primary Repair Ventral Hernia  . VENTRAL HERNIA REPAIR N/A 11/10/2013   Procedure: LAPAROSCOPIC VENTRAL HERNIA ;  Surgeon: Imogene Burn. Georgette Dover, MD;  Location: [facility];  Service: General;  Laterality: N/A;    Home Medications    Prior to Admission medications   Medication Sig Start Date End Date Taking? Authorizing Provider  losartan-hydrochlorothiazide (HYZAAR) 100-25 MG tablet Take 1 tablet by  mouth daily.   Yes [provider]  cyclobenzaprine (FLEXERIL) 10 MG tablet Take 1 tablet (10 mg total) by mouth 3 (three) times daily as needed for muscle spasms. 10/22/16   Lawyer, Harrell Gave, PA-C  DULoxetine (CYMBALTA) 60 MG capsule Take 60 mg by mouth daily. 11/13/15   [provider]  gabapentin (NEURONTIN) 600 MG tablet Take 1 tablet (600 mg total) by mouth 2 (two) times daily. 01/31/14   Donnie Mesa, MD  HYDROcodone-acetaminophen (NORCO/VICODIN) 5-325 MG tablet Take 1 tablet by mouth every 6 (six) hours as needed for moderate pain. 10/22/16   Lawyer, Harrell Gave, PA-C  levothyroxine (SYNTHROID, LEVOTHROID) 100 MCG tablet Take 100 mcg by mouth daily before breakfast.    [provider]  meloxicam (MOBIC) 15 MG tablet Take 15 mg by mouth daily. Reported on 12/20/2015    [provider]  nystatin (MYCOSTATIN) 100000 UNIT/ML suspension 5 mls swish and hold in your mouth for 2 minutes QID then swallow 10/22/16   Lawyer, Harrell Gave, PA-C  predniSONE (DELTASONE) 50 MG tablet Take 1 tablet (50 mg total) by  mouth daily. 10/22/16   Dalia Heading, PA-C   Family History No family history on file.  Social History Social History  Substance Use Topics  . Smoking status: Current Some Day Smoker    Years: 1.00  . Smokeless tobacco: Never Used  . Alcohol use No   Allergies   Sulfa antibiotics  Review of Systems Review of Systems  Constitutional: Negative for fever.  Respiratory: Positive for shortness of breath.   Cardiovascular: Negative for chest pain.  Gastrointestinal: Positive for abdominal pain.  Musculoskeletal: Positive for gait problem and myalgias (BLE).  Neurological: Positive for syncope (near while standing), speech difficulty and weakness (BLE).  All other systems reviewed and are negative.  Physical Exam Updated Vital Signs Ht 6\' 6"  (1.981 m)   Wt 270 lb (122.5 kg)   BMI 31.20 kg/m   Physical Exam  Constitutional: He appears  well-developed and well-nourished. No distress.  Tan and sunburnt diffusely.  HENT:  Head: Normocephalic and atraumatic.  Eyes: Conjunctivae are normal.  Neck: Normal range of motion.  Cardiovascular: Normal rate and regular rhythm.   Murmur heard. Loud systolic murmur heard.   Pulmonary/Chest: Effort normal and breath sounds normal. No respiratory distress. He has no wheezes. He has no rales.  Abdominal: Soft. He exhibits no distension. There is no tenderness.  Large scar to abdomen.  Musculoskeletal: Normal range of motion.  Neurological: He is alert.  Mild slurred speech.  Skin: No pallor.  Evidence of scratch mark to the [employer] [facility] overlying the vein on the right hand side. Slow capillary refill.  Psychiatric: He has a normal mood and affect. His behavior is normal.  Nursing note and vitals reviewed.  ED Treatments / Results  DIAGNOSTIC STUDIES: Oxygen Saturation is 97% on RA, normal by my interpretation.   COORDINATION OF CARE: 7:35 PM-Discussed next steps with pt. Pt verbalized understanding and is agreeable with the plan.   Labs (all labs ordered are listed, but only abnormal results are displayed) Labs Reviewed - No data to display  EKG  EKG Interpretation None      Radiology No results found.  Procedures Procedures   Medications Ordered in ED Medications - No data to display  Initial Impression / Assessment and Plan / ED Course  I have reviewed the triage vital signs and the nursing notes.  Pertinent labs & imaging results that were available during my care of the patient were reviewed by me and considered in my medical decision making (see chart for details).    Patient is a 51 year old male presenting with weakness and hypotension. The patient has had 2 days of weakness. Patient spent several days outside in the sun. Patient's girlfriend says she's been trying to get him to stay hydrated. However is has had a couple of presyncopal events in the last day. No  focal symptoms. Patient's scalp and says that he has used IV drugs in the past. Patient denied. Patient does have appears to be track marks on the right arm. However patient denies using IV drugs and endorses a scratch from a tree.  On physical exam patient has a very loud systolic murmur. Patient's girlfriend said that this was noted on a exam done for disability insurance. However it has never been worked up.  Given patient's hypotension, elevated lactic, and loud systolic murmur and concern for endocarditis. We'll treat with broad-spectrum antibiotics.  Other than that there is no clear source for infection.  10:10 PM Also considered some. However patient's CR can't tolerate angio.  No known risk factors, so will continue to work up for sepsis as source of shock.      EMERGENCY DEPARTMENT [place] CARDIAC EXAM "Study: Limited Ultrasound of the Heart and Pericardium"  INDICATIONS:Abnormal vital signs Multiple views of the heart and pericardium were obtained in real-time with a multi-frequency probe.  PERFORMED QI:WLNLGX IMAGES ARCHIVED?: Yes LIMITATIONS:  [place] not capturing video VIEWS USED: Parasternal long axis INTERPRETATION: Cardiac activity present, Pericardial effusion present and Normal contractility   CRITICAL CARE Performed by: Gardiner Sleeper Total critical care time: 60 minutes Critical care time was exclusive of separately billable procedures and treating other patients. Critical care was necessary to treat or prevent imminent or life-threatening deterioration. Critical care was time spent personally by me on the following activities: development of treatment plan with patient and/or surrogate as well as nursing, discussions with consultants, evaluation of patient's response to treatment, examination of patient, obtaining history from patient or surrogate, ordering and performing treatments and interventions, ordering and review of laboratory studies, ordering and review of  radiographic studies, pulse oximetry and re-evaluation of patient's condition.     Final Clinical Impressions(s) / ED Diagnoses   Final diagnoses:  None   New Prescriptions New Prescriptions   No medications on file   I personally performed the services described in this documentation, which was scribed in my presence. The recorded information has been reviewed and is accurate.       Macarthur Critchley, MD 05/23/17 2230

## 2017-05-23 NOTE — ED Notes (Signed)
Pt's skin is more red than his usual, denies any SOB, denies throat swelling, abx are finished at this time.  Dr. Thomasene Lot made aware.

## 2017-05-23 NOTE — Progress Notes (Addendum)
[facility] transfer discussed with Dr. Thomasene Lot Mr. Meech is a 51 year old male with pmh of polysubstance abuse, hypothyroidism,lichen planus, HLD, GERD, PNA, and OSA; who presented with complaints of malaise. Reported being outside completing yardwork last few days. On physical exam patient found to have new heart murmur and possible track marks found. Initial vital signs patient was afebrile, heart rates up to 106, respirations up to 24, blood pressures as low as 77/62, and O2 saturations maintained. Labs revealed WBC 21.9, hemoglobin 19, lactic acid 3.37, BUN 36, creatinine 3.23, troponin 0.08. Sepsis protocol was initiated with blood cultures, and the patient was resuscitated over 4 L of normal saline IV fluids. Blood pressures currently noted to be stable. Patient was given empiric antibiotics of vancomycin and Zosyn. Cardiology was called unclear formally consulted, and recommended to check an echocardiogram. Admit to stepdown as an inpatient here [facility].

## 2017-05-23 NOTE — ED Notes (Signed)
Pt's capillary refill and BP have improved following the initial treatment.  Pt attempted to urinate and is still unable to do so at this time.

## 2017-05-23 NOTE — ED Notes (Signed)
Pt c/o worsening SOB and weakness for the last two days.  He has been near syncopal upon ambulation at times.  Pt denies any fevers at home, he denies chest pain.  Pt has large umbilical scar from multiple abdominal surgeries, but he and girlfriend state the scar looks normal.  Pt is alert and oriented, talking in complete sentences without problem.  Nurse noted what appeared to be track marks on right [facility] when second IV was started, however pt denies IV drug use.

## 2017-05-23 NOTE — Progress Notes (Addendum)
Pharmacy Antibiotic Note Douglas Wiggins is a 51 y.o. male admitted on 05/23/2017 with SOB and weakness, near syncopal. Starting broad spectrum abx. With AKI, SCr 1.5 >3.2, normalized eCrCl ~ 25 ml/min, LA 3.3.   Plan: -Vancomycin 2 g IV x1 then 1500 mg IV q48h -Zosyn 3.375 g IV q8h -Monitor renal fx, cultures, VT at Css   Height: 6\' 6"  (198.1 cm) Weight: 270 lb (122.5 kg) IBW/kg (Calculated) : 91.4  Temp (24hrs), Avg:98.4 F (36.9 C), Min:98.2 F (36.8 C), Max:98.5 F (36.9 C)   Recent Labs Lab 05/23/17 1935 05/23/17 1948  WBC 21.9*  --   CREATININE 3.23*  --   LATICACIDVEN  --  3.37*    Estimated Creatinine Clearance: 41.1 mL/min (A) (by C-G formula based on SCr of 3.23 mg/dL (H)).    Allergies  Allergen Reactions  . Sulfa Antibiotics Hives and Rash    Antimicrobials this admission: 6/22 vancomycin > 6/22 zosyn >   Dose adjustments this admission: N/A   Microbiology results: 6/22 blood cx:   Douglas Wiggins 05/23/2017 8:33 PM

## 2017-05-23 NOTE — ED Notes (Signed)
CBG is 175

## 2017-05-23 NOTE — ED Notes (Signed)
EMT at bedside assisting patient with urinal.

## 2017-05-23 NOTE — ED Notes (Signed)
Pharmacy at [facility] called to state that the duplicate vancomycin order is not duplicate at all.  Pt to receive another gram of vancomycin.

## 2017-05-23 NOTE — ED Notes (Signed)
Updated family members to delay and plan of care.

## 2017-05-23 NOTE — ED Triage Notes (Signed)
Pt c/o episodes where he becomes SOB, "legs give out and I fall" x months-pt NAD-presents to triage in w/c

## 2017-05-24 ENCOUNTER — Encounter (HOSPITAL_COMMUNITY): Payer: Self-pay | Admitting: *Deleted

## 2017-05-24 DIAGNOSIS — N289 Disorder of kidney and ureter, unspecified: Secondary | ICD-10-CM

## 2017-05-24 DIAGNOSIS — A419 Sepsis, unspecified organism: Principal | ICD-10-CM

## 2017-05-24 DIAGNOSIS — E039 Hypothyroidism, unspecified: Secondary | ICD-10-CM

## 2017-05-24 DIAGNOSIS — F1910 Other psychoactive substance abuse, uncomplicated: Secondary | ICD-10-CM

## 2017-05-24 DIAGNOSIS — R778 Other specified abnormalities of plasma proteins: Secondary | ICD-10-CM | POA: Diagnosis present

## 2017-05-24 DIAGNOSIS — R7989 Other specified abnormal findings of blood chemistry: Secondary | ICD-10-CM

## 2017-05-24 DIAGNOSIS — R748 Abnormal levels of other serum enzymes: Secondary | ICD-10-CM

## 2017-05-24 DIAGNOSIS — I10 Essential (primary) hypertension: Secondary | ICD-10-CM

## 2017-05-24 DIAGNOSIS — R197 Diarrhea, unspecified: Secondary | ICD-10-CM | POA: Diagnosis present

## 2017-05-24 LAB — CBC WITH DIFFERENTIAL/PLATELET
BASOS ABS: 0 10*3/uL (ref 0.0–0.1)
BASOS PCT: 0 %
EOS ABS: 0.1 10*3/uL (ref 0.0–0.7)
EOS PCT: 1 %
HCT: 45.4 % (ref 39.0–52.0)
Hemoglobin: 15.2 g/dL (ref 13.0–17.0)
Lymphocytes Relative: 16 %
Lymphs Abs: 2.1 10*3/uL (ref 0.7–4.0)
MCH: 29 pg (ref 26.0–34.0)
MCHC: 33.5 g/dL (ref 30.0–36.0)
MCV: 86.6 fL (ref 78.0–100.0)
MONO ABS: 0.9 10*3/uL (ref 0.1–1.0)
Monocytes Relative: 7 %
Neutro Abs: 9.6 10*3/uL — ABNORMAL HIGH (ref 1.7–7.7)
Neutrophils Relative %: 76 %
PLATELETS: 154 10*3/uL (ref 150–400)
RBC: 5.24 MIL/uL (ref 4.22–5.81)
RDW: 14.5 % (ref 11.5–15.5)
WBC: 12.7 10*3/uL — ABNORMAL HIGH (ref 4.0–10.5)

## 2017-05-24 LAB — URINALYSIS, MICROSCOPIC (REFLEX)

## 2017-05-24 LAB — GASTROINTESTINAL PANEL BY PCR, STOOL (REPLACES STOOL CULTURE)
ADENOVIRUS F40/41: NOT DETECTED
Astrovirus: NOT DETECTED
CYCLOSPORA CAYETANENSIS: NOT DETECTED
Campylobacter species: NOT DETECTED
Cryptosporidium: NOT DETECTED
ENTEROAGGREGATIVE E COLI (EAEC): DETECTED — AB
ENTEROPATHOGENIC E COLI (EPEC): NOT DETECTED
Entamoeba histolytica: NOT DETECTED
Enterotoxigenic E coli (ETEC): NOT DETECTED
GIARDIA LAMBLIA: NOT DETECTED
Norovirus GI/GII: NOT DETECTED
PLESIMONAS SHIGELLOIDES: NOT DETECTED
Rotavirus A: NOT DETECTED
SALMONELLA SPECIES: NOT DETECTED
SHIGELLA/ENTEROINVASIVE E COLI (EIEC): NOT DETECTED
Sapovirus (I, II, IV, and V): NOT DETECTED
Shiga like toxin producing E coli (STEC): NOT DETECTED
VIBRIO SPECIES: NOT DETECTED
Vibrio cholerae: NOT DETECTED
YERSINIA ENTEROCOLITICA: NOT DETECTED

## 2017-05-24 LAB — RAPID URINE DRUG SCREEN, HOSP PERFORMED
AMPHETAMINES: POSITIVE — AB
Barbiturates: NOT DETECTED
Benzodiazepines: NOT DETECTED
COCAINE: NOT DETECTED
OPIATES: NOT DETECTED
Tetrahydrocannabinol: NOT DETECTED

## 2017-05-24 LAB — COMPREHENSIVE METABOLIC PANEL
ALBUMIN: 3.8 g/dL (ref 3.5–5.0)
ALK PHOS: 84 U/L (ref 38–126)
ALT: 42 U/L (ref 17–63)
ANION GAP: 10 (ref 5–15)
AST: 31 U/L (ref 15–41)
BILIRUBIN TOTAL: 1.8 mg/dL — AB (ref 0.3–1.2)
BUN: 33 mg/dL — AB (ref 6–20)
CALCIUM: 8.8 mg/dL — AB (ref 8.9–10.3)
CO2: 24 mmol/L (ref 22–32)
CREATININE: 2.18 mg/dL — AB (ref 0.61–1.24)
Chloride: 99 mmol/L — ABNORMAL LOW (ref 101–111)
GFR calc Af Amer: 39 mL/min — ABNORMAL LOW (ref >60)
GFR calc non Af Amer: 34 mL/min — ABNORMAL LOW (ref >60)
GLUCOSE: 105 mg/dL — AB (ref 65–99)
Potassium: 3.2 mmol/L — ABNORMAL LOW (ref 3.5–5.1)
Sodium: 133 mmol/L — ABNORMAL LOW (ref 135–145)
TOTAL PROTEIN: 6.6 g/dL (ref 6.5–8.1)

## 2017-05-24 LAB — URINALYSIS, ROUTINE W REFLEX MICROSCOPIC
BILIRUBIN URINE: NEGATIVE
GLUCOSE, UA: 250 mg/dL — AB
Ketones, ur: NEGATIVE mg/dL
Leukocytes, UA: NEGATIVE
NITRITE: NEGATIVE
PH: 5.5 (ref 5.0–8.0)
Protein, ur: 30 mg/dL — AB
Specific Gravity, Urine: 1.017 (ref 1.005–1.030)

## 2017-05-24 LAB — TROPONIN I
TROPONIN I: 0.04 ng/mL — AB (ref <0.03)
TROPONIN I: 0.03 ng/mL — AB (ref <0.03)
Troponin I: 0.05 ng/mL (ref <0.03)
Troponin I: 0.05 ng/mL (ref <0.03)

## 2017-05-24 LAB — C DIFFICILE QUICK SCREEN W PCR REFLEX
C Diff antigen: NEGATIVE
C Diff interpretation: NOT DETECTED
C Diff toxin: NEGATIVE

## 2017-05-24 LAB — MRSA PCR SCREENING: MRSA by PCR: NEGATIVE

## 2017-05-24 LAB — HIV ANTIBODY (ROUTINE TESTING W REFLEX): HIV Screen 4th Generation wRfx: NONREACTIVE

## 2017-05-24 LAB — I-STAT CG4 LACTIC ACID, ED: Lactic Acid, Venous: 1.67 mmol/L (ref 0.5–1.9)

## 2017-05-24 MED ORDER — VANCOMYCIN HCL 10 G IV SOLR
1500.0000 mg | INTRAVENOUS | Status: DC
  Administered 2017-05-24: 1500 mg via INTRAVENOUS
  Filled 2017-05-24 (×2): qty 1500

## 2017-05-24 MED ORDER — SODIUM CHLORIDE 0.9% FLUSH
3.0000 mL | Freq: Two times a day (BID) | INTRAVENOUS | Status: DC
  Administered 2017-05-24 – 2017-05-26 (×5): 3 mL via INTRAVENOUS

## 2017-05-24 MED ORDER — METRONIDAZOLE IN NACL 5-0.79 MG/ML-% IV SOLN
500.0000 mg | Freq: Three times a day (TID) | INTRAVENOUS | Status: DC
  Administered 2017-05-24 – 2017-05-25 (×3): 500 mg via INTRAVENOUS
  Filled 2017-05-24 (×5): qty 100

## 2017-05-24 MED ORDER — LEVOTHYROXINE SODIUM 100 MCG PO TABS: 100.0000 ug | ORAL_TABLET | Freq: Every day | ORAL | Status: DC

## 2017-05-24 MED ORDER — CYCLOBENZAPRINE HCL 10 MG PO TABS
10.0000 mg | ORAL_TABLET | Freq: Three times a day (TID) | ORAL | Status: DC | PRN
Start: 2017-05-24 — End: 2017-05-26
  Administered 2017-05-24: 10 mg via ORAL
  Filled 2017-05-24: qty 1

## 2017-05-24 MED ORDER — ACETAMINOPHEN 325 MG PO TABS
650.0000 mg | ORAL_TABLET | Freq: Four times a day (QID) | ORAL | Status: DC | PRN
Start: 2017-05-24 — End: 2017-05-26

## 2017-05-24 MED ORDER — NYSTATIN 100000 UNIT/ML MT SUSP
5.0000 mL | Freq: Four times a day (QID) | OROMUCOSAL | Status: DC
Start: 2017-05-24 — End: 2017-05-26
  Administered 2017-05-24 – 2017-05-26 (×7): 500000 [IU] via ORAL
  Filled 2017-05-24 (×7): qty 5

## 2017-05-24 MED ORDER — ACETAMINOPHEN 650 MG RE SUPP
650.0000 mg | Freq: Four times a day (QID) | RECTAL | Status: DC | PRN
Start: 2017-05-24 — End: 2017-05-26

## 2017-05-24 MED ORDER — DIPHENHYDRAMINE HCL 25 MG PO CAPS
25.0000 mg | ORAL_CAPSULE | Freq: Four times a day (QID) | ORAL | Status: DC | PRN
  Administered 2017-05-25 (×2): 25 mg via ORAL
  Administered 2017-05-25: 50 mg via ORAL
  Filled 2017-05-24 (×2): qty 1
  Filled 2017-05-24: qty 2

## 2017-05-24 MED ORDER — HEPARIN SODIUM (PORCINE) 5000 UNIT/ML IJ SOLN
5000.0000 [IU] | Freq: Three times a day (TID) | INTRAMUSCULAR | Status: DC
  Administered 2017-05-24 – 2017-05-26 (×7): 5000 [IU] via SUBCUTANEOUS
  Filled 2017-05-24 (×7): qty 1

## 2017-05-24 MED ORDER — HYDROXYZINE HCL 25 MG PO TABS
25.0000 mg | ORAL_TABLET | Freq: Three times a day (TID) | ORAL | Status: DC | PRN
Start: 2017-05-24 — End: 2017-05-26

## 2017-05-24 MED ORDER — SODIUM CHLORIDE 0.9 % IV BOLUS (SEPSIS)
500.0000 mL | Freq: Once | INTRAVENOUS | Status: AC
  Administered 2017-05-24: 500 mL via INTRAVENOUS

## 2017-05-24 MED ORDER — GABAPENTIN 600 MG PO TABS
600.0000 mg | ORAL_TABLET | Freq: Two times a day (BID) | ORAL | Status: DC
Start: 2017-05-24 — End: 2017-05-26
  Administered 2017-05-24 – 2017-05-26 (×5): 600 mg via ORAL
  Filled 2017-05-24 (×5): qty 1

## 2017-05-24 MED ORDER — SODIUM CHLORIDE 0.9 % IV SOLN
INTRAVENOUS | Status: AC
  Administered 2017-05-24: 05:00:00 via INTRAVENOUS

## 2017-05-24 MED ORDER — ONDANSETRON HCL 4 MG/2ML IJ SOLN: 4.0000 mg | Freq: Four times a day (QID) | INTRAMUSCULAR | Status: DC | PRN

## 2017-05-24 MED ORDER — HYDROCODONE-ACETAMINOPHEN 5-325 MG PO TABS
1.0000 | ORAL_TABLET | Freq: Four times a day (QID) | ORAL | Status: DC | PRN
  Administered 2017-05-24 – 2017-05-25 (×3): 2 via ORAL
  Filled 2017-05-24 (×3): qty 2

## 2017-05-24 MED ORDER — DULOXETINE HCL 60 MG PO CPEP
60.0000 mg | ORAL_CAPSULE | Freq: Every day | ORAL | Status: DC
  Administered 2017-05-24 – 2017-05-26 (×3): 60 mg via ORAL
  Filled 2017-05-24 (×3): qty 1

## 2017-05-24 MED ORDER — ONDANSETRON HCL 4 MG PO TABS: 4.0000 mg | ORAL_TABLET | Freq: Four times a day (QID) | ORAL | Status: DC | PRN

## 2017-05-24 MED ORDER — HYDROXYZINE HCL 25 MG PO TABS
25.0000 mg | ORAL_TABLET | Freq: Three times a day (TID) | ORAL | Status: DC | PRN
  Administered 2017-05-24: 25 mg via ORAL
  Filled 2017-05-24: qty 1

## 2017-05-24 MED ORDER — LEVOTHYROXINE SODIUM 112 MCG PO TABS
112.0000 ug | ORAL_TABLET | Freq: Every day | ORAL | Status: DC
  Administered 2017-05-24 – 2017-05-26 (×3): 112 ug via ORAL
  Filled 2017-05-24 (×3): qty 1

## 2017-05-24 MED ORDER — DEXTROSE 5 % IV SOLN
2.0000 g | INTRAVENOUS | Status: DC
  Administered 2017-05-24: 2 g via INTRAVENOUS
  Filled 2017-05-24 (×2): qty 2

## 2017-05-24 NOTE — Progress Notes (Addendum)
Pharmacy Antibiotic Note Douglas Wiggins is a 51 y.o. male admitted on 05/23/2017 with SOB and weakness, near syncopal. Starting broad spectrum abx w/ concern for endocarditis.   AKI improved overnight with SCr 1.5 >3.2>2.2, normalized eCrCl ~ 42 ml/min. Patient received zosyn in the ED, which was changed to cefepime. Will empirically increase vancomycin dose with improved renal function and follow closely.   Plan: -Vancomycin 1500mg  IV every 24 hours -Cefepime 2gm IV every 24 hours -Monitor renal fx, cultures, VT at Css   Height: 6\' 4"  (193 cm) Weight: 283 lb (128.4 kg) IBW/kg (Calculated) : 86.8  Temp (24hrs), Avg:98.5 F (36.9 C), Min:97.9 F (36.6 C), Max:99.5 F (37.5 C)   Recent Labs Lab 05/23/17 1935 05/23/17 1948 05/23/17 2245 05/24/17 0228 05/24/17 0447  WBC 21.9*  --   --   --  12.7*  CREATININE 3.23*  --   --   --  2.18*  LATICACIDVEN  --  3.37* 2.20* 1.67  --     Estimated Creatinine Clearance: 60.6 mL/min (A) (by C-G formula based on SCr of 2.18 mg/dL (H)).    Allergies  Allergen Reactions  . Sulfa Antibiotics Hives and Rash    Antimicrobials this admission: 6/22 vancomycin > 6/22 zosyn x1 6/23 cefepime   Dose adjustments this admission: N/A   Microbiology results: 6/22 blood cx:  Demetrius Charity, PharmD Acute Care Pharmacy Resident  Pager: [phone] 05/24/2017

## 2017-05-24 NOTE — Progress Notes (Signed)
[facility] PLAN OF CARE NOTE Patient: Douglas Wiggins EZB:[id number]   PCP: System, Pcp Not In DOB: May 14, 1966   DOA: 05/23/2017   DOS: 05/24/2017    Patient was admitted by my colleague Dr. Myna Hidalgo earlier on 05/24/2017. I have reviewed the H&P as well as assessment and plan and agree with the same. Important changes in the plan are listed below.  Plan of care: Principal Problem:   Sepsis ([place]) Active Problems:   H/O Hypertension   Hypothyroid   Acute renal insufficiency, nonoliguric   Substance abuse   Elevated troponin   Diarrhea Cefepime added to the regimen ordered When necessary hydroxyzine for anxiety, will not escalate further. Nystatin for oral thrush. Pending echocardiogram  Author: Berle Mull, MD [facility] Pager: [phone] 05/24/2017 6:08 PM   If 7PM-7AM, please contact night-coverage at [email or web address], password [facility]

## 2017-05-24 NOTE — ED Notes (Signed)
Pt assisted to bedside commode without difficulty.  He denies dizziness or weakness upon standing.

## 2017-05-24 NOTE — ED Notes (Signed)
Updated EDP to sepsis tracking question about remaining IV fluids to meet the sepsis criteria.  Pt to receive another 537ml IV.  Pt is tolerating PO fluids at this time as well.

## 2017-05-24 NOTE — Progress Notes (Signed)
[facility] admissions pager paged via Amion to notify of patient's arrive to unit.

## 2017-05-24 NOTE — ED Notes (Addendum)
Pt sitting up in bed, alert and oriented, family at bedside.  Updated to plan of care and delay in transportation.  Gave pt small snack of crackers and cheese.

## 2017-05-24 NOTE — H&P (Signed)
History and Physical    Douglas Wiggins GMW:[id number] DOB: [birth year] DOA: 05/23/2017  PCP: System, Pcp Not In   Patient coming from: Home, by way of [facility]  Chief Complaint: SOB, gen weakness, malaise   HPI: Douglas Wiggins is a 51 y.o. male with medical history significant for chronic low back pain, depression, hypothyroidism, hypertension, and polysubstance abuse, presenting to the emergency department with shortness of breath, generalized weakness, and malaise. Patient reports that he had been in his usual state of health until approximately a month ago when he noted the insidious development of exertional dyspnea associated with a generalized weakness and nonspecific malaise. The symptoms have been progressive over the past month, now with the patient unable to attend to his basic ADLs due to fatigue and malaise. He denies any fever, chills, or sweats. Denies chest pain, palpitations, or cough. No significant abdominal pain, nausea, vomiting, or diarrhea. Denies recent long distance travel or sick contacts. Reports being diagnosed with a heart murmur recently but has not had further workup of this. Patient has not attempted any interventions for his symptoms.   [facility] ED Course: Upon arrival to the [facility] ED, patient is found to have a temp of 37.5 C, saturating well on room air, blood pressure 86/35, and vitals otherwise stable. EKG features a sinus tachycardia with rate 104, inferior Q wave, and QTc interval of 500 ms. Chest x-ray is negative for acute cardiopulmonary disease. Chemistry panel reveals a BUN of 36 and serum creatinine of 3.23, up from 1.59 in January 2017. CBC is notable for a leukocytosis to 21,900 and a polycythemia with hemoglobin 19. Lactic acid is elevated to 3.37, troponin is elevated to 0.08, urinalysis is unremarkable, and UDS is positive for amphetamines. 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 CT of the abdomen and pelvis is negative for any acute process, but notable for hepatic steatosis. Blood cultures were obtained, 30 cc/kg normal saline bolus was given, and the patient was started on empiric vancomycin and Zosyn. There was concern for endocarditis and cardiology was consulted by the ED physician. Consultant advised a medical admission with transthoracic echo. Lactic acid normalized with IV fluids, blood pressure improved, and troponin trended down slightly to 0.05. Transferred to [facility] was arranged and the patient will be admitted to the stepdown unit as an inpatient here for ongoing evaluation and management of sepsis with conspicuous murmur and history of IV drug abuse raising concern for endocarditis.  Review of Systems:  All other systems reviewed and apart from HPI, are negative.  Past Medical History:  Diagnosis Date  . Arthritis    "I'm eat up w/it" (11/10/2013)  . Blood in stool   . Chronic lower back pain   . Depression   . Diverticulitis   . GERD (gastroesophageal reflux disease)   . Gout   . H/O hiatal hernia   . Head injury, closed 12/06/1990  . Headache(784.0)    "used to keep them; sinus allergies; more controlled now" (11/10/2013)  . High cholesterol   . History of blood transfusion 2013  . Hypertension    EKG 01/16/12 EPIC  . Hypothyroidism   . Lichen planus   . Migraines    "rarely anymore" (11/10/2013)  . Pneumonia   . Sleep apnea    does not wear cpap/states MILD- no sleep study in years  . Thyroid disease  Past Surgical History:  Procedure Laterality Date  . ANKLE ARTHROSCOPY Right    with excision and scrapping of bone  . APPLICATION OF WOUND VAC  10/29/2012   Procedure: APPLICATION OF WOUND VAC;  Surgeon: Imogene Burn. Georgette Dover, MD;  Location: [place] OR;  Service: General;;  . CARPAL TUNNEL RELEASE Left 1990's  . COLON RESECTION  10/26/2012   Procedure: COLON RESECTION;  Surgeon: Imogene Burn.  Georgette Dover, MD;  Location: [place] OR;  Service: General;  Laterality: N/A;  Partial colon resection with anastomosis  . COLON SURGERY  01/2012  . COLOSTOMY CLOSURE  04/13/2012   Procedure: COLOSTOMY CLOSURE;  Surgeon: Imogene Burn. Georgette Dover, MD;  Location: [facility];  Service: General;  Laterality: N/A;  . COLOSTOMY REVISION  01/06/2012   Procedure: COLON RESECTION SIGMOID;  Surgeon: Imogene Burn. Georgette Dover, MD;  Location: [facility];  Service: General;  Laterality: N/A;  with hartmans procedure  . HERNIA REPAIR  10/26/2012; 11/10/2013   VHR; [place] w/mesh (11/10/2013)  . INSERTION OF MESH N/A 11/10/2013   Procedure: INSERTION OF MESH;  Surgeon: Imogene Burn. Georgette Dover, MD;  Location: [place];  Service: General;  Laterality: N/A;  . KNEE ARTHROSCOPY Right 1990's  . LAPAROTOMY  10/26/2012   Procedure: EXPLORATORY LAPAROTOMY;  Surgeon: Imogene Burn. Georgette Dover, MD;  Location: [place];  Service: General;  Laterality: N/A;  . LAPAROTOMY  10/27/2012   Procedure: EXPLORATORY LAPAROTOMY;  Surgeon: Zenovia Jarred, MD;  Location: [place];  Service: General;  Laterality: N/A;  . LAPAROTOMY  10/29/2012   Procedure: EXPLORATORY LAPAROTOMY;  Surgeon: Imogene Burn. Georgette Dover, MD;  Location: [place];  Service: General;  Laterality: N/A;  exploratory laparotomy, partial closure of abdominal wound,    . REATTACHMENT HAND Left 2000's   "pinky" (11/10/2013)  . SHOULDER ARTHROSCOPY Right 1990's   "spurs and arthritis scraped off" (11/10/2013)  . VENTRAL HERNIA REPAIR  10/26/2012   Procedure: HERNIA REPAIR VENTRAL ADULT;  Surgeon: Imogene Burn. Georgette Dover, MD;  Location: [place];  Service: General;  Laterality: N/A;  Primary Repair Ventral Hernia  . VENTRAL HERNIA REPAIR N/A 11/10/2013   Procedure: LAPAROSCOPIC VENTRAL HERNIA ;  Surgeon: Imogene Burn. Georgette Dover, MD;  Location: [place];  Service: General;  Laterality: N/A;     reports that he has been smoking.  He has smoked for the past 1.00 year. He has never used smokeless tobacco. He reports that he does not drink alcohol or use  drugs.  Allergies  Allergen Reactions  . Sulfa Antibiotics Hives and Rash    History reviewed. No pertinent family history.   Prior to Admission medications   Medication Sig Start Date End Date Taking? Authorizing Provider  losartan-hydrochlorothiazide (HYZAAR) 100-25 MG tablet Take 1 tablet by mouth daily.   Yes [provider]  cyclobenzaprine (FLEXERIL) 10 MG tablet Take 1 tablet (10 mg total) by mouth 3 (three) times daily as needed for muscle spasms. 10/22/16   Lawyer, Harrell Gave, PA-C  DULoxetine (CYMBALTA) 60 MG capsule Take 60 mg by mouth daily. 11/13/15   [provider]  gabapentin (NEURONTIN) 600 MG tablet Take 1 tablet (600 mg total) by mouth 2 (two) times daily. 01/31/14   Donnie Mesa, MD  HYDROcodone-acetaminophen (NORCO/VICODIN) 5-325 MG tablet Take 1 tablet by mouth every 6 (six) hours as needed for moderate pain. 10/22/16   Lawyer, Harrell Gave, PA-C  levothyroxine (SYNTHROID, LEVOTHROID) 100 MCG tablet Take 100 mcg by mouth daily before breakfast.    [provider]  meloxicam (MOBIC) 15 MG tablet Take 15 mg  by mouth daily. Reported on 12/20/2015    [provider]  nystatin (MYCOSTATIN) 100000 UNIT/ML suspension 5 mls swish and hold in your mouth for 2 minutes QID then swallow 10/22/16   Dalia Heading, PA-C    Physical Exam: Vitals:   05/24/17 0215 05/24/17 0227 05/24/17 0313 05/24/17 0330  BP: 105/69 102/70  122/88  Pulse: 89 90  94  Resp: 19 (!) 22    Temp:  99.5 F (37.5 C)  97.9 F (36.6 C)  TempSrc:  Oral  Oral  SpO2: 96% 97%  97%  Weight:   128.4 kg (283 lb)   Height:   6\' 4"  (1.93 m)       Constitutional: No acute distress. Obese Eyes: PERTLA, lids and conjunctivae normal ENMT: Mucous membranes are moist. Posterior pharynx clear of any exudate or lesions.   Neck: normal, supple, no masses, no thyromegaly Respiratory: clear to auscultation bilaterally, no wheezing, no crackles. Normal respiratory effort.   Cardiovascular: S1 & S2 heard, regular rate and rhythm, grade 3 holosystolic murmur at RSB. No significant JVD. Abdomen: No distension, no tenderness, no masses palpated. Bowel sounds normal.  Musculoskeletal: no clubbing / cyanosis. No joint deformity upper and lower extremities.    Skin: no significant rashes, lesions, ulcers. Sunburn. Neurologic: CN 2-12 grossly intact. Sensation intact, DTR normal. Strength 5/5 in all 4 limbs.  Psychiatric: Alert and oriented x 3. Calm and cooperative.     Labs on Admission: I have personally reviewed following labs and imaging studies  CBC:  Recent Labs Lab 05/23/17 1935  WBC 21.9*  NEUTROABS 18.0*  HGB 19.0*  HCT 53.4*  MCV 83.6  PLT 161   Basic Metabolic Panel:  Recent Labs Lab 05/23/17 1935  NA 136  K 3.6  CL 95*  CO2 24  GLUCOSE 150*  BUN 36*  CREATININE 3.23*  CALCIUM 10.3   GFR: Estimated Creatinine Clearance: 40.9 mL/min (A) (by C-G formula based on SCr of 3.23 mg/dL (H)). Liver Function Tests:  Recent Labs Lab 05/23/17 1935  AST 45*  ALT 62  ALKPHOS 109  BILITOT 2.2*  PROT 8.8*  ALBUMIN 5.2*   No results for input(s): LIPASE, AMYLASE in the last 168 hours. No results for input(s): AMMONIA in the last 168 hours. Coagulation Profile: No results for input(s): INR, PROTIME in the last 168 hours. Cardiac Enzymes:  Recent Labs Lab 05/23/17 1935 05/24/17 0220  CKTOTAL 357  --   TROPONINI 0.08* 0.05*   BNP (last 3 results) No results for input(s): PROBNP in the last 8760 hours. HbA1C: No results for input(s): HGBA1C in the last 72 hours. CBG:  Recent Labs Lab 05/23/17 1938  GLUCAP 175*   Lipid Profile: No results for input(s): CHOL, HDL, LDLCALC, TRIG, CHOLHDL, LDLDIRECT in the last 72 hours. Thyroid Function Tests: No results for input(s): TSH, T4TOTAL, FREET4, T3FREE, THYROIDAB in the last 72 hours. Anemia Panel: No results for input(s): VITAMINB12, FOLATE, FERRITIN, TIBC, IRON, RETICCTPCT in  the last 72 hours. Urine analysis:    Component Value Date/Time   COLORURINE YELLOW 05/24/2017 0004   APPEARANCEUR CLEAR 05/24/2017 0004   LABSPEC 1.017 05/24/2017 0004   PHURINE 5.5 05/24/2017 0004   GLUCOSEU 250 (A) 05/24/2017 0004   HGBUR TRACE (A) 05/24/2017 0004   BILIRUBINUR NEGATIVE 05/24/2017 0004   KETONESUR NEGATIVE 05/24/2017 0004   PROTEINUR 30 (A) 05/24/2017 0004   UROBILINOGEN 0.2 10/27/2012 1714   NITRITE NEGATIVE 05/24/2017 0004   LEUKOCYTESUR NEGATIVE 05/24/2017 0004   Sepsis  Labs: @LABRCNTIP (procalcitonin:4,lacticidven:4) )No results found for this or any previous visit (from the past 240 hour(s)).   Radiological Exams on Admission: Ct Head Wo Contrast  Result Date: 05/23/2017 CLINICAL DATA:  51 year old male with syncopal episode. EXAM: CT HEAD WITHOUT CONTRAST TECHNIQUE: Contiguous axial images were obtained from the base of the skull through the vertex without intravenous contrast. COMPARISON:  None. FINDINGS: Brain: There is an area of old-appearing infarct with encephalomalacia in the left frontal lobe. The ventricles and sulci appropriate in size for patient's age. There is no intracranial hemorrhage. No mass effect or midline shift noted. No intra-axial fluid collection. Vascular: No hyperdense vessel or unexpected calcification. Skull: Normal. Negative for fracture or focal lesion. Sinuses/Orbits: No acute finding. Other: None IMPRESSION: 1. No acute intracranial hemorrhage. 2. 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 If symptoms persist, and there are no contraindications, MRI may provide better evaluation if clinically indicated. Electronically Signed   By: Anner Crete M.D.   On: 05/23/2017 21:36   Dg Chest Port 1 View  Result Date: 05/23/2017 CLINICAL DATA:  Abdominal pain and shortness of breath. Elevated lactate, history of multiple prior bowel surgeries. EXAM: PORTABLE CHEST 1 VIEW COMPARISON:  Radiograph 12/20/2015 FINDINGS: The  cardiomediastinal contours are normal. The lungs are clear. Pulmonary vasculature is normal. No consolidation, pleural effusion, or pneumothorax. No acute osseous abnormalities are seen. IMPRESSION: No active disease. Electronically Signed   By: Jeb Levering M.D.   On: 05/23/2017 21:36   Ct Renal Stone Study  Result Date: 05/23/2017 CLINICAL DATA:  Pt states over past two days he has been experiencing some episodes of dyspnea while sleeping, upper abdominal tightness, leg weakness with intermittent cramping, and near-syncopal episodes while standing. Abd pain and SOBHX back sx, recent mouth infections, HTN, diverticulosis, bowel sx EXAM: CT ABDOMEN AND PELVIS WITHOUT CONTRAST TECHNIQUE: Multidetector CT imaging of the abdomen and pelvis was performed following the standard protocol without IV contrast. COMPARISON:  08/31/2013 FINDINGS: Lower chest: Clear lung bases.  Heart normal size. Hepatobiliary: Extensive fatty infiltration of the liver. No liver mass or focal lesion. Liver is borderline enlarged. Gallbladder is unremarkable. No bile duct dilation. Pancreas: Unremarkable. No pancreatic ductal dilatation or surrounding inflammatory changes. Spleen: Normal in size without focal abnormality. Adrenals/Urinary Tract: Adrenal glands are unremarkable. Kidneys are normal, without renal calculi, focal lesion, or hydronephrosis. Bladder is unremarkable. Stomach/Bowel: Stomach and small bowel unremarkable. There is a bowel anastomosis staple line along the junction of the descending and sigmoid colon. No colonic wall thickening or adjacent inflammation. Normal appendix is visualized. Vascular/Lymphatic: No significant vascular findings are present. No enlarged abdominal or pelvic lymph nodes. Reproductive: Prostate is unremarkable. Other: There is a small fat containing hernia to the left of the left rectus abdominus muscle in the left lower quadrant. There are stable postsurgical changes along the anterior  abdominal wall midline with no other hernia. No ascites. Musculoskeletal: No fracture or acute finding. Degenerative changes of the lower thoracic and upper lumbar spine. Arthropathic changes of the hips, greater on the right. IMPRESSION: 1. No acute abnormalities within the abdomen or pelvis. 2. Extensive hepatic steatosis with borderline hepatomegaly. 3. Chronic changes from previous colon surgery. Small stable spur fat containing left lower quadrant abdominal wall hernia. Electronically Signed   By: Lajean Manes M.D.   On: 05/23/2017 21:40    EKG: Independently reviewed. Sinus tachycardia (rate 104), inferior Q-wave, QTc 500 ms.   Assessment/Plan  1. Sepsis, suspected secondary to endocarditis   - Pt  presents with hypotension, leukocytosis, elevated lactate, AKI  - Hx of IVDA and new murmur raises concern for endocarditis; CXR and urine unremarkable  - Blood cultures taken in ED, 30 cc/kg NS bolus given, and empiric abx initiated with vancomycin with Zosyn  - Lactate and BP has normalized with IVF  - Continue empiric abx with vancomycin, obtain echocardiogram    2. Acute kidney injury  - SCr is 3.23 on admission, up from 1.59 in 2017  - Suspect an AKI, likely prerenal in setting of suspected infection with hypotension and elevated lactate  - Pt was fluid-resuscitated in ED  - Hold ARB, HCTZ, and Mobic  - CT abd/pelvis without evidence for obstructive etiology  - Check urine studies, continue IVF hydration, repeat chem panel in am   3. Elevated troponin - Troponin 0.08 initially, trending down to 0.05  - No anginal complaints  - Likely a demand ischemia in setting of hypotension and renal insufficiency   4. Substance abuse  - Pt with hx of IVDA - UDS positive for amphetamines this admission  - Social work consultation requested   5. Hypertension - Hypotensive on arrival  - Losartan and HCTZ held on admission    6. Hypothyroidism - Continue Synthroid     DVT prophylaxis: sq  heparin Code Status: Full  Family Communication: Significant other updated at bedside Disposition Plan: Admit to SDU Consults called: Cardiology Admission status: Inpatient    Vianne Bulls, MD [facility] Pager [phone]  If 7PM-7AM, please contact night-coverage [email or web address] Password [facility]  05/24/2017, 4:29 AM

## 2017-05-24 NOTE — ED Notes (Addendum)
Sepsis tracking calling asking why pt has not received the additional 590ml of IV fluid.  At this time, there is not an order for fluids and EDP was waiting.  Pt is attempting to urinate again and nurse will check with EDP.  Sepsis tracking is also asking when pt will be transferred; per carelink dispatch, there are no available trucks at this time.

## 2017-05-24 NOTE — ED Notes (Signed)
Pt admits to increased SOB when transferring from bedside commode to bed, denies SOB while sitting in bed without exertion.

## 2017-05-24 NOTE — ED Notes (Signed)
EDP aware as to lactic acid, wants to hold off on more IV fluid at this time.

## 2017-05-25 ENCOUNTER — Other Ambulatory Visit (HOSPITAL_COMMUNITY): Payer: Self-pay

## 2017-05-25 ENCOUNTER — Inpatient Hospital Stay (HOSPITAL_COMMUNITY): Payer: Self-pay

## 2017-05-25 LAB — COMPREHENSIVE METABOLIC PANEL
ALT: 39 U/L (ref 17–63)
AST: 35 U/L (ref 15–41)
Albumin: 3.5 g/dL (ref 3.5–5.0)
Alkaline Phosphatase: 116 U/L (ref 38–126)
Anion gap: 9 (ref 5–15)
BILIRUBIN TOTAL: 0.7 mg/dL (ref 0.3–1.2)
BUN: 22 mg/dL — AB (ref 6–20)
CO2: 24 mmol/L (ref 22–32)
CREATININE: 1.38 mg/dL — AB (ref 0.61–1.24)
Calcium: 8.8 mg/dL — ABNORMAL LOW (ref 8.9–10.3)
Chloride: 102 mmol/L (ref 101–111)
GFR calc Af Amer: >60 mL/min (ref >60)
GFR, EST NON AFRICAN AMERICAN: 59 mL/min — AB (ref >60)
Glucose, Bld: 135 mg/dL — ABNORMAL HIGH (ref 65–99)
POTASSIUM: 3.2 mmol/L — AB (ref 3.5–5.1)
Sodium: 135 mmol/L (ref 135–145)
TOTAL PROTEIN: 6 g/dL — AB (ref 6.5–8.1)

## 2017-05-25 LAB — CBC WITH DIFFERENTIAL/PLATELET
BASOS ABS: 0 10*3/uL (ref 0.0–0.1)
Basophils Relative: 0 %
EOS ABS: 0.1 10*3/uL (ref 0.0–0.7)
EOS PCT: 1 %
HCT: 42.2 % (ref 39.0–52.0)
Hemoglobin: 14.3 g/dL (ref 13.0–17.0)
Lymphocytes Relative: 25 %
Lymphs Abs: 1.8 10*3/uL (ref 0.7–4.0)
MCH: 29 pg (ref 26.0–34.0)
MCHC: 33.9 g/dL (ref 30.0–36.0)
MCV: 85.6 fL (ref 78.0–100.0)
Monocytes Absolute: 0.5 10*3/uL (ref 0.1–1.0)
Monocytes Relative: 7 %
Neutro Abs: 4.6 10*3/uL (ref 1.7–7.7)
Neutrophils Relative %: 67 %
PLATELETS: 119 10*3/uL — AB (ref 150–400)
RBC: 4.93 MIL/uL (ref 4.22–5.81)
RDW: 13.9 % (ref 11.5–15.5)
WBC: 6.9 10*3/uL (ref 4.0–10.5)

## 2017-05-25 LAB — MAGNESIUM: MAGNESIUM: 1.6 mg/dL — AB (ref 1.7–2.4)

## 2017-05-25 LAB — LACTIC ACID, PLASMA: Lactic Acid, Venous: 1.8 mmol/L (ref 0.5–1.9)

## 2017-05-25 MED ORDER — SACCHAROMYCES BOULARDII 250 MG PO CAPS
250.0000 mg | ORAL_CAPSULE | Freq: Two times a day (BID) | ORAL | Status: DC
  Administered 2017-05-25 – 2017-05-26 (×3): 250 mg via ORAL
  Filled 2017-05-25 (×3): qty 1

## 2017-05-25 MED ORDER — MELOXICAM 7.5 MG PO TABS
15.0000 mg | ORAL_TABLET | Freq: Every day | ORAL | Status: DC
  Administered 2017-05-25 – 2017-05-26 (×2): 15 mg via ORAL
  Filled 2017-05-25 (×2): qty 2

## 2017-05-25 MED ORDER — PANTOPRAZOLE SODIUM 40 MG PO TBEC
40.0000 mg | DELAYED_RELEASE_TABLET | Freq: Every day | ORAL | Status: DC
  Administered 2017-05-26: 40 mg via ORAL
  Filled 2017-05-25: qty 1

## 2017-05-25 MED ORDER — PREDNISONE 20 MG PO TABS
20.0000 mg | ORAL_TABLET | Freq: Every day | ORAL | Status: AC
  Administered 2017-05-25: 20 mg via ORAL
  Filled 2017-05-25: qty 1

## 2017-05-25 MED ORDER — MAGNESIUM SULFATE 2 GM/50ML IV SOLN
2.0000 g | Freq: Once | INTRAVENOUS | Status: AC
  Administered 2017-05-25: 2 g via INTRAVENOUS
  Filled 2017-05-25: qty 50

## 2017-05-25 MED ORDER — CIPROFLOXACIN HCL 500 MG PO TABS
500.0000 mg | ORAL_TABLET | Freq: Two times a day (BID) | ORAL | Status: DC
  Administered 2017-05-25 – 2017-05-26 (×2): 500 mg via ORAL
  Filled 2017-05-25 (×2): qty 1

## 2017-05-25 MED ORDER — DEXTROSE 5 % IV SOLN
2.0000 g | Freq: Two times a day (BID) | INTRAVENOUS | Status: DC
  Administered 2017-05-25: 2 g via INTRAVENOUS
  Filled 2017-05-25 (×2): qty 2

## 2017-05-25 MED ORDER — POTASSIUM CHLORIDE CRYS ER 10 MEQ PO TBCR
30.0000 meq | EXTENDED_RELEASE_TABLET | ORAL | Status: AC
  Administered 2017-05-25 (×2): 30 meq via ORAL
  Filled 2017-05-25 (×2): qty 1

## 2017-05-25 MED ORDER — PREDNISONE 10 MG PO TABS
10.0000 mg | ORAL_TABLET | Freq: Every day | ORAL | Status: DC
  Administered 2017-05-26: 10 mg via ORAL
  Filled 2017-05-25: qty 1

## 2017-05-25 NOTE — Progress Notes (Signed)
[facility] Progress Note  Patient: Douglas Wiggins [id number]   PCP: System, Pcp Not In DOB: 11-01-1966   DOA: 05/23/2017   DOS: 05/25/2017   Date of Service: the patient was seen and examined on 05/25/2017  Subjective: Diarrhea somewhat better. No nausea no vomiting. No abdominal pain. Wife mentions of the patient was somewhat confused last night. No confusion right now.  Brief hospital course: Pt. with PMH of chronic back pain, depression, hypothyroidism, hypertension, substance abuse; admitted on 05/23/2017, presented with complaint of fatigue and weakness and confusion, was found to have sepsis. Currently further plan is further workup.  Assessment and Plan: 1. Suspected sepsis. Enteroaggregative E coli (EAEC) causing diarrhea. Blood cultures negative for 24 hours. I will de-escalate the antibiotic regimen Cipro Floxin. Continue monitoring the blood culture for now. Clinically getting better.  2. Acute encephalopathy as well as generalized weakness. CT head unremarkable. Given the results of possible old CVA, recommended MRI if the symptoms persist. We'll get an MRI to rule out any acute abnormality. Next and monitor.  3. Elevated troponin - Troponin 0.08 initially, trending down to 0.05  - No anginal complaints  - Likely a demand ischemia in setting of hypotension and renal insufficiency   4. Substance abuse  - Pt with hx of IVDA - UDS positive for amphetamines this admission  - Social work consultation requested   5. Hypertension - Hypotensive on arrival  - Losartan and HCTZ held on admission    6. Hypothyroidism - Continue Synthroid    7. Acute kidney injury  - SCr is 3.23 on admission, up from 1.59 in 2017  - Suspect an AKI, likely prerenal in setting of suspected infection with hypotension and elevated lactate  - Pt was fluid-resuscitated in ED  - Hold ARB, HCTZ, and resume Mobic  - CT abd/pelvis without evidence for obstructive etiology   Diet:  regular diet DVT Prophylaxis: subcutaneous Heparin  Advance goals of care discussion: full code  Family Communication: family was present at bedside, at the time of interview. The pt provided permission to discuss medical plan with the family. Opportunity was given to ask question and all questions were answered satisfactorily.   Disposition:  Discharge to home.  Consultants: none Procedures: nonoe  Antibiotics: Anti-infectives    Start     Dose/Rate Route Frequency Ordered Stop   05/25/17 2200  vancomycin (VANCOCIN) 1,500 mg in sodium chloride 0.9 % 500 mL IVPB  Status:  Discontinued     1,500 mg 250 mL/hr over 120 Minutes Intravenous Every 48 hours 05/23/17 2119 05/24/17 1150   05/25/17 1000  ceFEPIme (MAXIPIME) 2 g in dextrose 5 % 50 mL IVPB     2 g 100 mL/hr over 30 Minutes Intravenous Every 12 hours 05/25/17 0919     05/24/17 2330  vancomycin (VANCOCIN) 1,500 mg in sodium chloride 0.9 % 500 mL IVPB     1,500 mg 250 mL/hr over 120 Minutes Intravenous Every 24 hours 05/24/17 1150     05/24/17 2030  metroNIDAZOLE (FLAGYL) IVPB 500 mg     500 mg 100 mL/hr over 60 Minutes Intravenous Every 8 hours 05/24/17 1949     05/24/17 1200  ceFEPIme (MAXIPIME) 2 g in dextrose 5 % 50 mL IVPB  Status:  Discontinued     2 g 100 mL/hr over 30 Minutes Intravenous Every 24 hours 05/24/17 1150 05/25/17 0919   05/24/17 0400  piperacillin-tazobactam (ZOSYN) IVPB 3.375 g  Status:  Discontinued     3.375 g  12.5 mL/hr over 240 Minutes Intravenous Every 8 hours 05/23/17 2119 05/24/17 0429   05/23/17 2100  vancomycin (VANCOCIN) IVPB 1000 mg/200 mL premix     1,000 mg 200 mL/hr over 60 Minutes Intravenous  Once 05/23/17 2034 05/23/17 2241   05/23/17 2030  piperacillin-tazobactam (ZOSYN) IVPB 3.375 g     3.375 g 100 mL/hr over 30 Minutes Intravenous  Once 05/23/17 2017 05/23/17 2057   05/23/17 2030  vancomycin (VANCOCIN) IVPB 1000 mg/200 mL premix     1,000 mg 200 mL/hr over 60 Minutes Intravenous   Once 05/23/17 2017 05/23/17 2138       Objective: Physical Exam: Vitals:   05/25/17 0029 05/25/17 0429 05/25/17 1400 05/25/17 1555  BP: 136/84 139/90 (!) 145/98 137/84  Pulse: 76 70 80 86  Resp:   20   Temp: 97.8 F (36.6 C) 98 F (36.7 C) 98.1 F (36.7 C) 98 F (36.7 C)  TempSrc: Oral Oral Oral Oral  SpO2: 98% 94% 98%   Weight:  128.7 kg (283 lb 11.2 oz)    Height:        Intake/Output Summary (Last 24 hours) at 05/25/17 1815 Last data filed at 05/25/17 1300  Gross per 24 hour  Intake             1950 ml  Output              700 ml  Net             1250 ml   Filed Weights   05/23/17 1917 05/24/17 0313 05/25/17 0429  Weight: 122.5 kg (270 lb) 128.4 kg (283 lb) 128.7 kg (283 lb 11.2 oz)   General: Alert, Awake and Oriented to Time, Place and Person. Appear in  distress, affect normal Eyes: PERRL, Conjunctiva normal ENT: Oral Mucosa clear moist Neck: no JVD, no Abnormal Mass Or lumps Cardiovascular: S1 and S2 Present, no Murmur, Peripheral Pulses Present Respiratory: normal respiratory effort, Bilateral Air entry equal and Decreased, no use of accessory muscle, Clear to Auscultation, no Crackles, no wheezes Abdomen: Bowel Sound present, Soft and no tenderness, no hernia Skin: no redness, no Rash, no induration Extremities: no Pedal edema, no calf tenderness Neurologic: Grossly no focal neuro deficit. Bilaterally Equal motor strength  Data Reviewed: CBC:  Recent Labs Lab 05/23/17 1935 05/24/17 0447 05/25/17 0324  WBC 21.9* 12.7* 6.9  NEUTROABS 18.0* 9.6* 4.6  HGB 19.0* 15.2 14.3  HCT 53.4* 45.4 42.2  MCV 83.6 86.6 85.6  PLT 272 154 937*   Basic Metabolic Panel:  Recent Labs Lab 05/23/17 1935 05/24/17 0447 05/25/17 0324  NA 136 133* 135  K 3.6 3.2* 3.2*  CL 95* 99* 102  CO2 24 24 24   GLUCOSE 150* 105* 135*  BUN 36* 33* 22*  CREATININE 3.23* 2.18* 1.38*  CALCIUM 10.3 8.8* 8.8*  MG  --   --  1.6*    Liver Function Tests:  Recent Labs Lab  05/23/17 1935 05/24/17 0447 05/25/17 0324  AST 45* 31 35  ALT 62 42 39  ALKPHOS 109 84 116  BILITOT 2.2* 1.8* 0.7  PROT 8.8* 6.6 6.0*  ALBUMIN 5.2* 3.8 3.5   No results for input(s): LIPASE, AMYLASE in the last 168 hours. No results for input(s): AMMONIA in the last 168 hours. Coagulation Profile: No results for input(s): INR, PROTIME in the last 168 hours. Cardiac Enzymes:  Recent Labs Lab 05/23/17 1935 05/24/17 0220 05/24/17 0447 05/24/17 1011 05/24/17 1610  CKTOTAL 357  --   --   --   --  TROPONINI 0.08* 0.05* 0.05* 0.04* 0.03*   BNP (last 3 results) No results for input(s): PROBNP in the last 8760 hours. CBG:  Recent Labs Lab 05/23/17 1938  GLUCAP 175*   Studies: Mr Brain Wo Contrast  Result Date: 05/25/2017 CLINICAL DATA:  Acute encephalopathy. EXAM: MRI HEAD WITHOUT CONTRAST TECHNIQUE: Multiplanar, multiecho pulse sequences of the brain and surrounding structures were obtained without intravenous contrast. COMPARISON:  Head CT 05/23/2017 FINDINGS: Brain: There is no evidence of acute infarct, mass, midline shift, or extra-axial fluid collection. There is mild cerebral atrophy which is advanced for age. Encephalomalacia is again seen anteriorly in the left frontal lobe with associated chronic hemosiderin deposition. There is also a small amount of encephalomalacia in the anterior right frontal lobe at the same level. Mild scattered cerebral white matter T2 hyperintensities elsewhere are nonspecific. Vascular: Major intracranial vascular flow voids are preserved. Dominant left vertebral artery. Skull and upper cervical spine: Unremarkable bone marrow signal. Sinuses/Orbits: Unremarkable orbits. Paranasal sinuses and mastoid air cells are clear. Other: None. IMPRESSION: 1. No acute intracranial abnormality. 2. 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 3. Mild cerebral atrophy, advanced for age. Electronically Signed   By:  Logan Bores M.D.   On: 05/25/2017 13:23    Scheduled Meds: . DULoxetine  60 mg Oral Daily  . gabapentin  600 mg Oral BID  . heparin  5,000 Units Subcutaneous Q8H  . levothyroxine  112 mcg Oral QAC breakfast  . meloxicam  15 mg Oral Daily  . nystatin  5 mL Oral QID  . [START ON 05/26/2017] predniSONE  10 mg Oral Q breakfast  . saccharomyces boulardii  250 mg Oral BID  . sodium chloride flush  3 mL Intravenous Q12H   Continuous Infusions: . ceFEPime (MAXIPIME) IV Stopped (05/25/17 1053)  . metronidazole Stopped (05/25/17 1431)  . vancomycin Stopped (05/25/17 0138)   PRN Meds: acetaminophen **OR** acetaminophen, cyclobenzaprine, diphenhydrAMINE, HYDROcodone-acetaminophen, hydrOXYzine, ondansetron **OR** ondansetron (ZOFRAN) IV  Time spent: 35 minutes  Author: Berle Mull, MD [facility] Pager: [phone] 05/25/2017 6:15 PM  If 7PM-7AM, please contact night-coverage at [email or web address], password [facility]

## 2017-05-25 NOTE — Progress Notes (Signed)
Pharmacy Antibiotic Note Douglas Wiggins is a 51 y.o. male admitted on 05/23/2017 with SOB and weakness, near syncopal. Was started on vancomycin, cefepime, and metronidazole; however, cultures remain negative except for enteroaggregative E coli per stool PCR.    AKI improved overnight with SCr 1.5 >3.2>2.2, normalized eCrCl ~ 42 ml/min. MD wishes to de-escalate therapy to ciprofloxacin. Based on last doses of antibiotics, will start ciprofloxacin tonight.   Plan: Start ciprofloxacin 500mg  PO q12h Stop vancomycin, cefepime, and metronidazole Monitor renal function, C/S, and length of therapy   Height: 6\' 4"  (193 cm) Weight: 283 lb 11.2 oz (128.7 kg) IBW/kg (Calculated) : 86.8  Temp (24hrs), Avg:98.1 F (36.7 C), Min:97.8 F (36.6 C), Max:98.8 F (37.1 C)   Recent Labs Lab 05/23/17 1935 05/23/17 1948 05/23/17 2245 05/24/17 0228 05/24/17 0447 05/25/17 0324  WBC 21.9*  --   --   --  12.7* 6.9  CREATININE 3.23*  --   --   --  2.18* 1.38*  LATICACIDVEN  --  3.37* 2.20* 1.67  --  1.8    Estimated Creatinine Clearance: 95.9 mL/min (A) (by C-G formula based on SCr of 1.38 mg/dL (H)).    Allergies  Allergen Reactions  . Sulfa Antibiotics Hives and Rash    Antimicrobials this admission: 6/22 vancomycin >>6/24 6/22 zosyn x1 6/23 cefepime >>6/24 6/23 metronidazole >>6/24  Dose adjustments this admission: N/A   Microbiology results: 6/22 blood cx: Buckhorn, Cain Sieve, PharmD Clinical Pharmacy Resident [phone] (Pager) 05/25/2017 6:41 PM

## 2017-05-26 ENCOUNTER — Inpatient Hospital Stay (HOSPITAL_COMMUNITY): Payer: Self-pay

## 2017-05-26 DIAGNOSIS — A4151 Sepsis due to Escherichia coli [E. coli]: Secondary | ICD-10-CM

## 2017-05-26 DIAGNOSIS — I360 Nonrheumatic tricuspid (valve) stenosis: Secondary | ICD-10-CM

## 2017-05-26 LAB — BASIC METABOLIC PANEL
ANION GAP: 6 (ref 5–15)
BUN: 21 mg/dL — ABNORMAL HIGH (ref 6–20)
CO2: 24 mmol/L (ref 22–32)
Calcium: 8.6 mg/dL — ABNORMAL LOW (ref 8.9–10.3)
Chloride: 100 mmol/L — ABNORMAL LOW (ref 101–111)
Creatinine, Ser: 1.14 mg/dL (ref 0.61–1.24)
GFR calc Af Amer: >60 mL/min (ref >60)
GFR calc non Af Amer: >60 mL/min (ref >60)
GLUCOSE: 109 mg/dL — AB (ref 65–99)
Potassium: 3.9 mmol/L (ref 3.5–5.1)
Sodium: 130 mmol/L — ABNORMAL LOW (ref 135–145)

## 2017-05-26 LAB — ECHOCARDIOGRAM COMPLETE
Height: 76 in
WEIGHTICAEL: 4552 [oz_av]

## 2017-05-26 MED ORDER — AMLODIPINE BESYLATE 5 MG PO TABS
5.0000 mg | ORAL_TABLET | Freq: Every day | ORAL | Status: DC
  Administered 2017-05-26: 5 mg via ORAL
  Filled 2017-05-26: qty 1

## 2017-05-26 MED ORDER — PANTOPRAZOLE SODIUM 40 MG PO TBEC: 40.0000 mg | DELAYED_RELEASE_TABLET | Freq: Every day | ORAL | 0 refills | Status: DC

## 2017-05-26 MED ORDER — CIPROFLOXACIN HCL 500 MG PO TABS: 500.0000 mg | ORAL_TABLET | Freq: Two times a day (BID) | ORAL | 0 refills | Status: AC

## 2017-05-26 MED ORDER — AMLODIPINE BESYLATE 5 MG PO TABS: 5.0000 mg | ORAL_TABLET | Freq: Every day | ORAL | 0 refills | Status: DC

## 2017-05-26 NOTE — Care Management Note (Signed)
Case Management Note  Patient Details  Name: NICODEMUS DENK MRN: [id number] Date of Birth: June 21, 1966  Subjective/Objective:  Pt presented for  SOB, Weakness, Syncope and treated for Sepsis with IV Vancomycin. Pt is from home with the support of Girlfriend Nira Conn @ [phone]. Pt is without insurance and states he has an appeal for Medicaid Pending. Pt was denied disability.                  Action/Plan: CM did call the Financial Counseling Office- unable to speak with a representative and a voicemail was left for the [facility] office to call patient.  Pt aware that if he does not receive a call to call office of FC. Pt states he will be able to get medications at [facility] because his sister will assist with medications. No further needs from CM at this time.   Expected Discharge Date:  05/26/17               Expected Discharge Plan:  Home/Self Care  In-House Referral:  Financial Counselor  Discharge planning Services  CM Consult  Post Acute Care Choice:  NA Choice offered to:  NA  DME Arranged:  N/A DME Agency:  NA  HH Arranged:  NA HH Agency:  NA  Status of Service:  Completed, signed off  If discussed at [place] of Stay Meetings, dates discussed:    Additional Comments:  Bethena Roys, RN 05/26/2017, 12:19 PM

## 2017-05-26 NOTE — Progress Notes (Signed)
The patient has been given discharge instructions along with a new medication list and what to take today. He has a follow up appointment with a new MD set up and prescriptions to pick up. He is discharging with his girl friend via car.   Saddie Benders RN

## 2017-05-26 NOTE — Progress Notes (Signed)
  Echocardiogram 2D Echocardiogram has been performed.  Douglas Wiggins 05/26/2017, 9:38 AM

## 2017-05-28 NOTE — Discharge Summary (Signed)
[facility] Discharge Summary   Patient: Douglas Wiggins GYJ:[id number]   PCP: System, Pcp Not In DOB: [birth year]   Date of admission: 05/23/2017   Date of discharge: 05/26/2017     Discharge Diagnoses:  Principal Problem:   Sepsis ([place]) Active Problems:   H/O Hypertension   Hypothyroid   Acute renal insufficiency, nonoliguric   Substance abuse   Elevated troponin   Diarrhea   Admitted From: home Disposition:  home  Recommendations for Outpatient Follow-up:  1. Please follow up with PCP in 1 week   Follow-up Information    PCP. Schedule an appointment as soon as possible for a visit in 1 week(s).        Octavio Graves, DO. Go on 05/29/2017.   Why:  4 pm complete and bring new patient paper work.   Contact information: [address] [place] [address] [phone]          Diet recommendation: cardiac diet  Activity: The patient is advised to gradually reintroduce usual activities.  Discharge Condition: good  Code Status: full code  History of present illness: As per the H and P dictated on admission, "Douglas Wiggins is a 51 y.o. male with medical history significant for chronic low back pain, depression, hypothyroidism, hypertension, and polysubstance abuse, presenting to the emergency department with shortness of breath, generalized weakness, and malaise. Patient reports that he had been in his usual state of health until approximately a month ago when he noted the insidious development of exertional dyspnea associated with a generalized weakness and nonspecific malaise. The symptoms have been progressive over the past month, now with the patient unable to attend to his basic ADLs due to fatigue and malaise. He denies any fever, chills, or sweats. Denies chest pain, palpitations, or cough. No significant abdominal pain, nausea, vomiting, or diarrhea. Denies recent long distance travel or sick contacts. Reports being diagnosed with a heart murmur recently but has  not had further workup of this. Patient has not attempted any interventions for his symptoms. "  Hospital Course:  Summary of his active problems in the hospital is as following. 1. Suspected sepsis. Enteroaggregative E coli (EAEC) causing diarrhea. Blood cultures negative for 48 hours. I will de-escalate the antibiotic regimen Cipro..  2. Acute encephalopathy as well as generalized weakness. CT head unremarkable. Given the results of possible old CVA, recommended MRI if the symptoms persist. MRI negative for acute abnormality.  3. Elevated troponin - Troponin 0.08 initially, trending down to 0.05  - No anginal complaints  - Likely a demand ischemia in setting of hypotension and renal insufficiency  4. Substance abuse  - Pt with hx of IVDA - UDS positive for amphetamines this admission  - Social work consultation requested   5. Hypertension - Hypotensive on arrival  - Losartan and HCTZ held on admission   6. Hypothyroidism - Continue Synthroid   7. Acute kidney injury  - SCr is 3.23 on admission, up from 1.59 in 2017  - Suspect an AKI, likely prerenal in setting of suspected infection with hypotension and elevated lactate  - Pt was fluid-resuscitated in ED  - Hold ARB, HCTZ, and resume Mobic  - CT abd/pelvis without evidence for obstructive etiology   All other chronic medical condition were stable during the hospitalization.  Patient was ambulatory without any assistance. On the day of the discharge the patient's vitals were stable, and no other acute medical condition were reported by patient. the patient was felt  safe to be discharge at home with family.  Procedures and Results:  none   Consultations:  none  DISCHARGE MEDICATION: Discharge Medication List as of 05/26/2017 11:56 AM    START taking these medications   Details  amLODipine (NORVASC) 5 MG tablet Take 1 tablet (5 mg total) by mouth daily., Starting Tue 05/27/2017, Normal    ciprofloxacin  (CIPRO) 500 MG tablet Take 1 tablet (500 mg total) by mouth 2 (two) times daily., Starting Mon 05/26/2017, Until Tue 05/27/2017, Normal    pantoprazole (PROTONIX) 40 MG tablet Take 1 tablet (40 mg total) by mouth at bedtime., Starting Mon 05/26/2017, Until Mon 06/09/2017, Normal      CONTINUE these medications which have NOT CHANGED   Details  cyclobenzaprine (FLEXERIL) 10 MG tablet Take 1 tablet (10 mg total) by mouth 3 (three) times daily as needed for muscle spasms., Starting Tue 10/22/2016, Print    DULoxetine (CYMBALTA) 60 MG capsule Take 60 mg by mouth daily., Starting Mon 11/13/2015, Historical Med    gabapentin (NEURONTIN) 600 MG tablet Take 1 tablet (600 mg total) by mouth 2 (two) times daily., Starting Mon 01/31/2014, Print    HYDROcodone-acetaminophen (NORCO/VICODIN) 5-325 MG tablet Take 1 tablet by mouth every 6 (six) hours as needed for moderate pain., Starting Tue 10/22/2016, Print    levothyroxine (SYNTHROID, LEVOTHROID) 112 MCG tablet Take 112 mcg by mouth daily before breakfast. , Historical Med    meloxicam (MOBIC) 15 MG tablet Take 15 mg by mouth daily. Reported on 12/20/2015, Until Discontinued, Historical Med    nystatin (MYCOSTATIN) 100000 UNIT/ML suspension 5 mls swish and hold in your mouth for 2 minutes QID then swallow, Print      STOP taking these medications     losartan-hydrochlorothiazide (HYZAAR) 100-25 MG tablet        Allergies  Allergen Reactions  . Sulfa Antibiotics Hives and Rash   Discharge Instructions    Diet - low sodium heart healthy    Complete by:  As directed    Discharge instructions    Complete by:  As directed    It is important that you read following instructions as well as go over your medication list with RN to help you understand your care after this hospitalization.  Discharge Instructions: Please follow-up with PCP in one week  Please request your primary care physician to go over all Hospital Tests and Procedure/Radiological  results at the follow up,  Please get all Hospital records sent to your PCP by signing hospital release before you go home.   Do not take more than prescribed Pain, Sleep and Anxiety Medications. You were cared for by a hospitalist during your hospital stay. If you have any questions about your discharge medications or the care you received while you were in the hospital after you are discharged, you can call the unit and ask to speak with the hospitalist on call if the hospitalist that took care of you is not available.  Once you are discharged, your primary care physician will handle any further medical issues. Please note that NO REFILLS for any discharge medications will be authorized once you are discharged, as it is imperative that you return to your primary care physician (or establish a relationship with a primary care physician if you do not have one) for your aftercare needs so that they can reassess your need for medications and monitor your lab values. You Must read complete instructions/literature along with all the possible adverse reactions/side effects for all  the Medicines you take and that have been prescribed to you. Take any new Medicines after you have completely understood and accept all the possible adverse reactions/side effects. Wear Seat belts while driving. If you have smoked or chewed Tobacco in the last 2 yrs please stop smoking and/or stop any Recreational drug use.   Increase activity slowly    Complete by:  As directed      Discharge Exam: Filed Weights   05/24/17 0313 05/25/17 0429 05/26/17 0500  Weight: 128.4 kg (283 lb) 128.7 kg (283 lb 11.2 oz) 129 kg (284 lb 8 oz)   Vitals:   05/26/17 0810 05/26/17 0950  BP: (!) 154/102 (!) 162/99  Pulse: 68   Resp: 20   Temp: 97.6 F (36.4 C)    General: Appear in no distress, no Rash; Oral Mucosa moist. Cardiovascular: S1 and S2 Present, no Murmur, no JVD Respiratory: Bilateral Air entry present and Clear to  Auscultation, no Crackles, no wheezes Abdomen: Bowel Sound present, Soft and no tenderness Extremities: no Pedal edema, no calf tenderness Neurology: Grossly no focal neuro deficit.  The results of significant diagnostics from this hospitalization (including imaging, microbiology, ancillary and laboratory) are listed below for reference.    Significant Diagnostic Studies: Ct Head Wo Contrast  Result Date: 05/23/2017 CLINICAL DATA:  51 year old male with syncopal episode. EXAM: CT HEAD WITHOUT CONTRAST TECHNIQUE: Contiguous axial images were obtained from the base of the skull through the vertex without intravenous contrast. COMPARISON:  None. FINDINGS: Brain: There is an area of old-appearing infarct with encephalomalacia in the left frontal lobe. The ventricles and sulci appropriate in size for patient's age. There is no intracranial hemorrhage. No mass effect or midline shift noted. No intra-axial fluid collection. Vascular: No hyperdense vessel or unexpected calcification. Skull: Normal. Negative for fracture or focal lesion. Sinuses/Orbits: No acute finding. Other: None IMPRESSION: 1. No acute intracranial hemorrhage. 2. 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 If symptoms persist, and there are no contraindications, MRI may provide better evaluation if clinically indicated. Electronically Signed   By: Anner Crete M.D.   On: 05/23/2017 21:36   Mr Brain Wo Contrast  Result Date: 05/25/2017 CLINICAL DATA:  Acute encephalopathy. EXAM: MRI HEAD WITHOUT CONTRAST TECHNIQUE: Multiplanar, multiecho pulse sequences of the brain and surrounding structures were obtained without intravenous contrast. COMPARISON:  Head CT 05/23/2017 FINDINGS: Brain: There is no evidence of acute infarct, mass, midline shift, or extra-axial fluid collection. There is mild cerebral atrophy which is advanced for age. Encephalomalacia is again seen anteriorly in the left frontal lobe with associated  chronic hemosiderin deposition. There is also a small amount of encephalomalacia in the anterior right frontal lobe at the same level. Mild scattered cerebral white matter T2 hyperintensities elsewhere are nonspecific. Vascular: Major intracranial vascular flow voids are preserved. Dominant left vertebral artery. Skull and upper cervical spine: Unremarkable bone marrow signal. Sinuses/Orbits: Unremarkable orbits. Paranasal sinuses and mastoid air cells are clear. Other: None. IMPRESSION: 1. No acute intracranial abnormality. 2. 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 3. Mild cerebral atrophy, advanced for age. Electronically Signed   By: Logan Bores M.D.   On: 05/25/2017 13:23   Dg Chest Port 1 View  Result Date: 05/23/2017 CLINICAL DATA:  Abdominal pain and shortness of breath. Elevated lactate, history of multiple prior bowel surgeries. EXAM: PORTABLE CHEST 1 VIEW COMPARISON:  Radiograph 12/20/2015 FINDINGS: The cardiomediastinal contours are normal. The lungs are clear. Pulmonary vasculature is normal. No  consolidation, pleural effusion, or pneumothorax. No acute osseous abnormalities are seen. IMPRESSION: No active disease. Electronically Signed   By: Jeb Levering M.D.   On: 05/23/2017 21:36   Ct Renal Stone Study  Result Date: 05/23/2017 CLINICAL DATA:  Pt states over past two days he has been experiencing some episodes of dyspnea while sleeping, upper abdominal tightness, leg weakness with intermittent cramping, and near-syncopal episodes while standing. Abd pain and SOBHX back sx, recent mouth infections, HTN, diverticulosis, bowel sx EXAM: CT ABDOMEN AND PELVIS WITHOUT CONTRAST TECHNIQUE: Multidetector CT imaging of the abdomen and pelvis was performed following the standard protocol without IV contrast. COMPARISON:  08/31/2013 FINDINGS: Lower chest: Clear lung bases.  Heart normal size. Hepatobiliary: Extensive fatty infiltration of the  liver. No liver mass or focal lesion. Liver is borderline enlarged. Gallbladder is unremarkable. No bile duct dilation. Pancreas: Unremarkable. No pancreatic ductal dilatation or surrounding inflammatory changes. Spleen: Normal in size without focal abnormality. Adrenals/Urinary Tract: Adrenal glands are unremarkable. Kidneys are normal, without renal calculi, focal lesion, or hydronephrosis. Bladder is unremarkable. Stomach/Bowel: Stomach and small bowel unremarkable. There is a bowel anastomosis staple line along the junction of the descending and sigmoid colon. No colonic wall thickening or adjacent inflammation. Normal appendix is visualized. Vascular/Lymphatic: No significant vascular findings are present. No enlarged abdominal or pelvic lymph nodes. Reproductive: Prostate is unremarkable. Other: There is a small fat containing hernia to the left of the left rectus abdominus muscle in the left lower quadrant. There are stable postsurgical changes along the anterior abdominal wall midline with no other hernia. No ascites. Musculoskeletal: No fracture or acute finding. Degenerative changes of the lower thoracic and upper lumbar spine. Arthropathic changes of the hips, greater on the right. IMPRESSION: 1. No acute abnormalities within the abdomen or pelvis. 2. Extensive hepatic steatosis with borderline hepatomegaly. 3. Chronic changes from previous colon surgery. Small stable spur fat containing left lower quadrant abdominal wall hernia. Electronically Signed   By: Lajean Manes M.D.   On: 05/23/2017 21:40    Microbiology: Recent Results (from the past 240 hour(s))  Blood culture (routine x 2)     Status: None (Preliminary result)   Collection Time: 05/23/17  7:25 PM  Result Value Ref Range Status   Specimen Description BLOOD LEFT ANTECUBITAL  Final   Special Requests   Final    BOTTLES DRAWN AEROBIC AND ANAEROBIC Blood Culture adequate volume   Culture   Final    NO GROWTH 4 DAYS Performed at [facility], [address], [place], [place] [address]    Report Status PENDING  Incomplete  Blood culture (routine x 2)     Status: None (Preliminary result)   Collection Time: 05/23/17  7:45 PM  Result Value Ref Range Status   Specimen Description BLOOD RIGHT ANTECUBITAL  Final   Special Requests   Final    BOTTLES DRAWN AEROBIC AND ANAEROBIC Blood Culture adequate volume   Culture   Final    NO GROWTH 4 DAYS Performed at [facility], [place] [address], [place], [place] [address]    Report Status PENDING  Incomplete  MRSA PCR Screening     Status: None   Collection Time: 05/24/17  3:26 AM  Result Value Ref Range Status   MRSA by PCR NEGATIVE NEGATIVE Final    Comment:        The GeneXpert MRSA Assay (FDA approved for NASAL specimens only), is one component of a comprehensive MRSA colonization  surveillance program. It is not intended to diagnose MRSA infection nor to guide or monitor treatment for MRSA infections.   C difficile quick scan w PCR reflex     Status: None   Collection Time: 05/24/17 10:08 AM  Result Value Ref Range Status   C Diff antigen NEGATIVE NEGATIVE Final   C Diff toxin NEGATIVE NEGATIVE Final   C Diff interpretation No C. difficile detected.  Final  Gastrointestinal Panel by PCR , Stool     Status: Abnormal   Collection Time: 05/24/17 10:08 AM  Result Value Ref Range Status   Campylobacter species NOT DETECTED NOT DETECTED Final   Plesimonas shigelloides NOT DETECTED NOT DETECTED Final   Salmonella species NOT DETECTED NOT DETECTED Final   Yersinia enterocolitica NOT DETECTED NOT DETECTED Final   Vibrio species NOT DETECTED NOT DETECTED Final   Vibrio cholerae NOT DETECTED NOT DETECTED Final   Enteroaggregative E coli (EAEC) DETECTED (A) NOT DETECTED Final    Comment: RESULT CALLED TO, READ BACK BY AND VERIFIED WITH: MATT PEARCE AT 1900 ON 05/24/17 RWW    Enteropathogenic E coli (EPEC) NOT DETECTED NOT DETECTED Final   Enterotoxigenic E coli  (ETEC) NOT DETECTED NOT DETECTED Final   Shiga like toxin producing E coli (STEC) NOT DETECTED NOT DETECTED Final   Shigella/Enteroinvasive E coli (EIEC) NOT DETECTED NOT DETECTED Final   Cryptosporidium NOT DETECTED NOT DETECTED Final   Cyclospora cayetanensis NOT DETECTED NOT DETECTED Final   Entamoeba histolytica NOT DETECTED NOT DETECTED Final   Giardia lamblia NOT DETECTED NOT DETECTED Final   Adenovirus F40/41 NOT DETECTED NOT DETECTED Final   Astrovirus NOT DETECTED NOT DETECTED Final   Norovirus GI/GII NOT DETECTED NOT DETECTED Final   Rotavirus A NOT DETECTED NOT DETECTED Final   Sapovirus (I, II, IV, and V) NOT DETECTED NOT DETECTED Final     Labs: CBC:  Recent Labs Lab 05/23/17 1935 05/24/17 0447 05/25/17 0324  WBC 21.9* 12.7* 6.9  NEUTROABS 18.0* 9.6* 4.6  HGB 19.0* 15.2 14.3  HCT 53.4* 45.4 42.2  MCV 83.6 86.6 85.6  PLT 272 154 902*   Basic Metabolic Panel:  Recent Labs Lab 05/23/17 1935 05/24/17 0447 05/25/17 0324 05/26/17 0756  NA 136 133* 135 130*  K 3.6 3.2* 3.2* 3.9  CL 95* 99* 102 100*  CO2 24 24 24 24   GLUCOSE 150* 105* 135* 109*  BUN 36* 33* 22* 21*  CREATININE 3.23* 2.18* 1.38* 1.14  CALCIUM 10.3 8.8* 8.8* 8.6*  MG  --   --  1.6*  --    Liver Function Tests:  Recent Labs Lab 05/23/17 1935 05/24/17 0447 05/25/17 0324  AST 45* 31 35  ALT 62 42 39  ALKPHOS 109 84 116  BILITOT 2.2* 1.8* 0.7  PROT 8.8* 6.6 6.0*  ALBUMIN 5.2* 3.8 3.5   No results for input(s): LIPASE, AMYLASE in the last 168 hours. No results for input(s): AMMONIA in the last 168 hours. Cardiac Enzymes:  Recent Labs Lab 05/23/17 1935 05/24/17 0220 05/24/17 0447 05/24/17 1011 05/24/17 1610  CKTOTAL 357  --   --   --   --   TROPONINI 0.08* 0.05* 0.05* 0.04* 0.03*   BNP (last 3 results)  Recent Labs  05/23/17 1941  BNP 81.3   CBG:  Recent Labs Lab 05/23/17 1938  GLUCAP 175*   Time spent: 35 minutes  Signed:  PATEL, PRANAV  [facility] 05/26/2017   , 11:05 PM

## 2017-05-29 LAB — CULTURE, BLOOD (ROUTINE X 2)
CULTURE: NO GROWTH
Culture: NO GROWTH
SPECIAL REQUESTS: ADEQUATE
Special Requests: ADEQUATE

## 2018-04-17 ENCOUNTER — Ambulatory Visit: Payer: Self-pay | Admitting: Family Medicine

## 2018-08-07 ENCOUNTER — Ambulatory Visit (HOSPITAL_COMMUNITY)
Admission: RE | Admit: 2018-08-07 | Discharge: 2018-08-07 | Disposition: A | Discharge status: Home / Self Care | Payer: Medicaid Other | Source: Ambulatory Visit | Attending: *Deleted | Admitting: *Deleted

## 2018-08-07 ENCOUNTER — Other Ambulatory Visit (HOSPITAL_COMMUNITY): Payer: Self-pay | Admitting: Family Medicine

## 2018-08-07 ENCOUNTER — Other Ambulatory Visit (HOSPITAL_COMMUNITY): Payer: Self-pay | Admitting: *Deleted

## 2018-08-07 DIAGNOSIS — M5136 Other intervertebral disc degeneration, lumbar region: Secondary | ICD-10-CM | POA: Diagnosis not present

## 2018-08-07 DIAGNOSIS — M25562 Pain in left knee: Secondary | ICD-10-CM | POA: Diagnosis not present

## 2018-08-07 DIAGNOSIS — M238X2 Other internal derangements of left knee: Secondary | ICD-10-CM | POA: Insufficient documentation

## 2018-08-07 DIAGNOSIS — M160 Bilateral primary osteoarthritis of hip: Secondary | ICD-10-CM | POA: Diagnosis present

## 2018-08-07 DIAGNOSIS — M5134 Other intervertebral disc degeneration, thoracic region: Secondary | ICD-10-CM | POA: Diagnosis not present

## 2018-08-07 DIAGNOSIS — M5030 Other cervical disc degeneration, unspecified cervical region: Secondary | ICD-10-CM | POA: Insufficient documentation

## 2018-08-07 DIAGNOSIS — M542 Cervicalgia: Secondary | ICD-10-CM

## 2018-08-07 DIAGNOSIS — M24851 Other specific joint derangements of right hip, not elsewhere classified: Secondary | ICD-10-CM | POA: Insufficient documentation

## 2019-01-11 NOTE — H&P (Signed)
TOTAL HIP ADMISSION H&P  Patient is admitted for right total hip arthroplasty, anterior approach.  Subjective:  Chief Complaint:   Right hip primary OA / pain  HPI: Douglas Wiggins, 53 y.o. male, has a history of pain and functional disability in the right hip(s) due to arthritis and patient has failed non-surgical conservative treatments for greater than 12 weeks to include NSAID's and/or analgesics, corticosteriod injections and activity modification.  Onset of symptoms was gradual starting 3+ years ago with gradually worsening course since that time.The patient noted no past surgery on the right hip(s).  Patient currently rates pain in the right hip at 9 out of 10 with activity. Patient has night pain, worsening of pain with activity and weight bearing, trendelenberg gait, pain that interfers with activities of daily living and pain with passive range of motion. Patient has evidence of periarticular osteophytes and joint space narrowing by imaging studies. This condition presents safety issues increasing the risk of falls. There is no current active infection.  Risks, benefits and expectations were discussed with the patient.  Risks including but not limited to the risk of anesthesia, blood clots, nerve damage, blood vessel damage, failure of the prosthesis, infection and up to and including death.  Patient understand the risks, benefits and expectations and wishes to proceed with surgery.   PCP: System, Pcp Not In  D/C Plans:       Home   Post-op Meds:       No Rx given   Tranexamic Acid:      To be given - IV   Decadron:      Is to be given  FYI:      ASA  Norco  DME:   Pt already has equipment  PT:   No PT     Patient Active Problem List   Diagnosis Date Noted  . Substance abuse ([place]) 05/24/2017  . Elevated troponin 05/24/2017  . Diarrhea 05/24/2017  . Sepsis ([place]) 05/23/2017  . Ventral hernia 11/10/2013  . Recurrent ventral incisional hernia 08/26/2013  . Disruption of  wound, unspecified 03/01/2013  . Respiratory failure, post-operative ([place]) 10/28/2012  . Acute renal insufficiency, nonoliguric 10/28/2012  . Status post laparotomy, evac of hemoperitoneum and wound vac placement 11/26 10/28/2012  .  Ex lap with partial colectomy with revision of colon anastomosis, repair of ventral hernia 11/25 10/28/2012  . Encephalopathy acute 10/27/2012  . Abdominal compartment syndrome, post lap 11/26 10/27/2012  . Shock due to abdominal compartment syndrome and acute blood loss 10/27/2012  . Hemoperitoneum, resolved post lap 11/26 10/27/2012  . H/O Hypertension 01/06/2012  . Hypothyroid 01/06/2012   Past Medical History:  Diagnosis Date  . Arthritis    "I'm eat up w/it" (11/10/2013)  . Blood in stool   . Chronic lower back pain   . Depression   . Diverticulitis   . GERD (gastroesophageal reflux disease)   . Gout   . H/O hiatal hernia   . Head injury, closed 12/06/1990  . Headache(784.0)    "used to keep them; sinus allergies; more controlled now" (11/10/2013)  . High cholesterol   . History of blood transfusion 2013  . Hypertension    EKG 01/16/12 EPIC  . Hypothyroidism   . Lichen planus   . Migraines    "rarely anymore" (11/10/2013)  . Pneumonia   . Sleep apnea    does not wear cpap/states MILD- no sleep study in years  . Thyroid disease     Past Surgical History:  Procedure Laterality Date  . ANKLE ARTHROSCOPY Right    with excision and scrapping of bone  . APPLICATION OF WOUND VAC  10/29/2012   Procedure: APPLICATION OF WOUND VAC;  Surgeon: Imogene Burn. Georgette Dover, MD;  Location: [facility];  Service: General;;  . CARPAL TUNNEL RELEASE Left 1990's  . COLON RESECTION  10/26/2012   Procedure: COLON RESECTION;  Surgeon: Imogene Burn. Georgette Dover, MD;  Location: [facility];  Service: General;  Laterality: N/A;  Partial colon resection with anastomosis  . COLON SURGERY  01/2012  . COLOSTOMY CLOSURE  04/13/2012   Procedure: COLOSTOMY CLOSURE;  Surgeon: Imogene Burn. Georgette Dover, MD;   Location: [facility];  Service: General;  Laterality: N/A;  . COLOSTOMY REVISION  01/06/2012   Procedure: COLON RESECTION SIGMOID;  Surgeon: Imogene Burn. Georgette Dover, MD;  Location: [facility];  Service: General;  Laterality: N/A;  with hartmans procedure  . HERNIA REPAIR  10/26/2012; 11/10/2013   VHR; [place] w/mesh (11/10/2013)  . INSERTION OF MESH N/A 11/10/2013   Procedure: INSERTION OF MESH;  Surgeon: Imogene Burn. Georgette Dover, MD;  Location: [facility];  Service: General;  Laterality: N/A;  . KNEE ARTHROSCOPY Right 1990's  . LAPAROTOMY  10/26/2012   Procedure: EXPLORATORY LAPAROTOMY;  Surgeon: Imogene Burn. Georgette Dover, MD;  Location: [facility];  Service: General;  Laterality: N/A;  . LAPAROTOMY  10/27/2012   Procedure: EXPLORATORY LAPAROTOMY;  Surgeon: Zenovia Jarred, MD;  Location: [facility];  Service: General;  Laterality: N/A;  . LAPAROTOMY  10/29/2012   Procedure: EXPLORATORY LAPAROTOMY;  Surgeon: Imogene Burn. Georgette Dover, MD;  Location: [facility];  Service: General;  Laterality: N/A;  exploratory laparotomy, partial closure of abdominal wound,    . REATTACHMENT HAND Left 2000's   "pinky" (11/10/2013)  . SHOULDER ARTHROSCOPY Right 1990's   "spurs and arthritis scraped off" (11/10/2013)  . VENTRAL HERNIA REPAIR  10/26/2012   Procedure: HERNIA REPAIR VENTRAL ADULT;  Surgeon: Imogene Burn. Georgette Dover, MD;  Location: [facility];  Service: General;  Laterality: N/A;  Primary Repair Ventral Hernia  . VENTRAL HERNIA REPAIR N/A 11/10/2013   Procedure: LAPAROSCOPIC VENTRAL HERNIA ;  Surgeon: Imogene Burn. Georgette Dover, MD;  Location: [facility];  Service: General;  Laterality: N/A;    No current facility-administered medications for this encounter.    Current Outpatient Medications  Medication Sig Dispense Refill Last Dose  . amLODipine (NORVASC) 5 MG tablet Take 1 tablet (5 mg total) by mouth daily. 30 tablet 0   . cyclobenzaprine (FLEXERIL) 10 MG tablet Take 1 tablet (10 mg total) by mouth 3 (three) times daily as needed for muscle spasms. 15 tablet 0 Past Week at Unknown  time  . DULoxetine (CYMBALTA) 60 MG capsule Take 60 mg by mouth daily.   Past Week at Unknown time  . gabapentin (NEURONTIN) 600 MG tablet Take 1 tablet (600 mg total) by mouth 2 (two) times daily. (Patient taking differently: Take 300 mg by mouth 3 (three) times daily. ) 60 tablet 3 Past Week at Unknown time  . HYDROcodone-acetaminophen (NORCO/VICODIN) 5-325 MG tablet Take 1 tablet by mouth every 6 (six) hours as needed for moderate pain. 15 tablet 0 Past Week at Unknown time  . levothyroxine (SYNTHROID, LEVOTHROID) 112 MCG tablet Take 112 mcg by mouth daily before breakfast.    Past Week at Unknown time  . meloxicam (MOBIC) 15 MG tablet Take 15 mg by mouth daily. Reported on 12/20/2015   Past Week at Unknown time  . nystatin (MYCOSTATIN) 100000 UNIT/ML suspension 5 mls swish and hold  in your mouth for 2 minutes QID then swallow (Patient not taking: Reported on 05/24/2017) 60 mL 0 Completed Course at Unknown time  . pantoprazole (PROTONIX) 40 MG tablet Take 1 tablet (40 mg total) by mouth at bedtime. 14 tablet 0    Allergies  Allergen Reactions  . Sulfa Antibiotics Hives and Rash    Social History   Tobacco Use  . Smoking status: Current Some Day Smoker    Years: 1.00  . Smokeless tobacco: Never Used  Substance Use Topics  . Alcohol use: No       Review of Systems  Constitutional: Negative.   HENT: Negative.   Eyes: Negative.   Respiratory: Negative.   Cardiovascular: Negative.   Gastrointestinal: Positive for heartburn.  Genitourinary: Negative.   Musculoskeletal: Positive for back pain and joint pain.  Skin: Negative.   Neurological: Negative.   Endo/Heme/Allergies: Negative.   Psychiatric/Behavioral: Positive for depression.    Objective:  Physical Exam  Constitutional: He is oriented to person, place, and time. He appears well-developed.  HENT:  Head: Normocephalic.  Eyes: Pupils are equal, round, and reactive to light.  Neck: Neck supple. No JVD present. No tracheal  deviation present. No thyromegaly present.  Cardiovascular: Normal rate, regular rhythm and intact distal pulses.  Murmur heard. Respiratory: Effort normal and breath sounds normal. No respiratory distress. He has no wheezes.  GI: Soft. There is no abdominal tenderness. There is no guarding.  Musculoskeletal:     Right hip: He exhibits decreased range of motion, decreased strength, tenderness and bony tenderness. He exhibits no swelling, no deformity and no laceration.  Lymphadenopathy:    He has no cervical adenopathy.  Neurological: He is alert and oriented to person, place, and time.  Skin: Skin is warm and dry.  Psychiatric: He has a normal mood and affect.     Labs:   Estimated body mass index is 34.63 kg/m as calculated from the following:   Height as of 05/24/17: 6\' 4"  (1.93 m).   Weight as of 05/26/17: 129 kg.   Imaging Review Plain radiographs demonstrate severe degenerative joint disease of the right hip. The bone quality appears to be good for age and reported activity level.     Assessment/Plan:  End stage arthritis, right hip  The patient history, physical examination, clinical judgement of the provider and imaging studies are consistent with end stage degenerative joint disease of the right hip and total hip arthroplasty is deemed medically necessary. The treatment options including medical management, injection therapy, arthroscopy and arthroplasty were discussed at length. The risks and benefits of total hip arthroplasty were presented and reviewed. The risks due to aseptic loosening, infection, stiffness, dislocation/subluxation,  thromboembolic complications and other imponderables were discussed.  The patient acknowledged the explanation, agreed to proceed with the plan and consent was signed. Patient is being admitted for inpatient treatment for surgery, pain control, PT, OT, prophylactic antibiotics, VTE prophylaxis, progressive ambulation and ADL's and discharge  planning.The patient is planning to be discharged home.   [place] Shelli Portilla   PA-C  01/11/2019, 12:01 PM

## 2019-01-14 NOTE — Patient Instructions (Addendum)
Douglas Wiggins  1966-03-11     Your procedure is scheduled on:  01-19-2019   Report to [facility] Main  Entrance, Report to admitting at  09:00 AM    Call this number if you have problems the morning of surgery [phone]        Remember: Do not eat food or drink liquids :After Midnight.   BRUSH YOUR TEETH MORNING OF SURGERY AND RINSE YOUR MOUTH OUT, NO CHEWING GUM CANDY OR MINTS.        Take these medicines the morning of surgery with A SIP OF WATER:   Carbomazepine  (tegretol), Duloxetine (cymbalta), Levothyroxine, Pantoprazole (protonix)                                   You may not have any metal on your body including hair pins and               piercings  Do not wear jewelry, make-up, lotions, powders or perfumes, deodorant                         Men may shave face and neck.      Do not bring valuables to the hospital. [place].  Contacts, dentures or bridgework may not be worn into surgery.  Leave suitcase in the car. After surgery it may be brought to your room.    _____________________________________________________________________            [facility] - Preparing for Surgery Before surgery, you can play an important role.  Because skin is not sterile, your skin needs to be as free of germs as possible.  You can reduce the number of germs on your skin by washing with CHG (chlorahexidine gluconate) soap before surgery.  CHG is an antiseptic cleaner which kills germs and bonds with the skin to continue killing germs even after washing. Please DO NOT use if you have an allergy to CHG or antibacterial soaps.  If your skin becomes reddened/irritated stop using the CHG and inform your nurse when you arrive at Short Stay. Do not shave (including legs and underarms) for at least 48 hours prior to the first CHG shower.  You may shave your face/neck. Please follow these instructions  carefully:  1.  Shower with CHG Soap the night before surgery and the  morning of Surgery.  2.  If you choose to wash your hair, wash your hair first as usual with your  normal  shampoo.  3.  After you shampoo, rinse your hair and body thoroughly to remove the  shampoo.                            4.  Use CHG as you would any other liquid soap.  You can apply chg directly  to the skin and wash                       Gently with a scrungie or clean washcloth.  5.  Apply the CHG Soap to your body ONLY FROM THE NECK DOWN.   Do not use on face/ open  Wound or open sores. Avoid contact with eyes, ears mouth and genitals (private parts).                       Wash face,  Genitals (private parts) with your normal soap.             6.  Wash thoroughly, paying special attention to the area where your surgery  will be performed.  7.  Thoroughly rinse your body with warm water from the neck down.  8.  DO NOT shower/wash with your normal soap after using and rinsing off  the CHG Soap.             9.  Pat yourself dry with a clean towel.            10.  Wear clean pajamas.            11.  Place clean sheets on your bed the night of your first shower and do not  sleep with pets. Day of Surgery : Do not apply any lotions/deodorants the morning of surgery.  Please wear clean clothes to the hospital/surgery center.  FAILURE TO FOLLOW THESE INSTRUCTIONS MAY RESULT IN THE CANCELLATION OF YOUR SURGERY PATIENT SIGNATURE_________________________________  NURSE SIGNATURE__________________________________  ________________________________________________________________________   Douglas Wiggins  An incentive spirometer is a tool that can help keep your lungs clear and active. This tool measures how well you are filling your lungs with each breath. Taking long deep breaths may help reverse or decrease the chance of developing breathing (pulmonary) problems (especially infection)  following:  A long period of time when you are unable to move or be active. BEFORE THE PROCEDURE   If the spirometer includes an indicator to show your best effort, your nurse or respiratory therapist will set it to a desired goal.  If possible, sit up straight or lean slightly forward. Try not to slouch.  Hold the incentive spirometer in an upright position. INSTRUCTIONS FOR USE  1. Sit on the edge of your bed if possible, or sit up as far as you can in bed or on a chair. 2. Hold the incentive spirometer in an upright position. 3. Breathe out normally. 4. Place the mouthpiece in your mouth and seal your lips tightly around it. 5. Breathe in slowly and as deeply as possible, raising the piston or the ball toward the top of the column. 6. Hold your breath for 3-5 seconds or for as long as possible. Allow the piston or ball to fall to the bottom of the column. 7. Remove the mouthpiece from your mouth and breathe out normally. 8. Rest for a few seconds and repeat Steps 1 through 7 at least 10 times every 1-2 hours when you are awake. Take your time and take a few normal breaths between deep breaths. 9. The spirometer may include an indicator to show your best effort. Use the indicator as a goal to work toward during each repetition. 10. After each set of 10 deep breaths, practice coughing to be sure your lungs are clear. If you have an incision (the cut made at the time of surgery), support your incision when coughing by placing a pillow or rolled up towels firmly against it. Once you are able to get out of bed, walk around indoors and cough well. You may stop using the incentive spirometer when instructed by your caregiver.  RISKS AND COMPLICATIONS  Take your time so you do not get dizzy or light-headed.  If you are in pain, you may need to take or ask for pain medication before doing incentive spirometry. It is harder to take a deep breath if you are having pain. AFTER USE  Rest and  breathe slowly and easily.  It can be helpful to keep track of a log of your progress. Your caregiver can provide you with a simple table to help with this. If you are using the spirometer at home, follow these instructions: [place] IF:   You are having difficultly using the spirometer.  You have trouble using the spirometer as often as instructed.  Your pain medication is not giving enough relief while using the spirometer.  You develop fever of 100.5 F (38.1 C) or higher. SEEK IMMEDIATE MEDICAL CARE IF:   You cough up bloody sputum that had not been present before.  You develop fever of 102 F (38.9 C) or greater.  You develop worsening pain at or near the incision site. MAKE SURE YOU:   Understand these instructions.  Will watch your condition.  Will get help right away if you are not doing well or get worse. Document Released: 03/31/2007 Document Revised: 02/10/2012 Document Reviewed: 06/01/2007 ExitCare Patient Information 2014 [employer], [place].   ________________________________________________________________________  WHAT IS A BLOOD TRANSFUSION? Blood Transfusion Information  A transfusion is the replacement of blood or some of its parts. Blood is made up of multiple cells which provide different functions.  Red blood cells carry oxygen and are used for blood loss replacement.  White blood cells fight against infection.  Platelets control bleeding.  Plasma helps clot blood.  Other blood products are available for specialized needs, such as hemophilia or other clotting disorders. BEFORE THE TRANSFUSION  Who gives blood for transfusions?   Healthy volunteers who are fully evaluated to make sure their blood is safe. This is blood bank blood. Transfusion therapy is the safest it has ever been in the practice of medicine. Before blood is taken from a donor, a complete history is taken to make sure that person has no history of diseases nor engages in  risky social behavior (examples are intravenous drug use or sexual activity with multiple partners). The donor's travel history is screened to minimize risk of transmitting infections, such as malaria. The donated blood is tested for signs of infectious diseases, such as HIV and hepatitis. The blood is then tested to be sure it is compatible with you in order to minimize the chance of a transfusion reaction. If you or a relative donates blood, this is often done in anticipation of surgery and is not appropriate for emergency situations. It takes many days to process the donated blood. RISKS AND COMPLICATIONS Although transfusion therapy is very safe and saves many lives, the main dangers of transfusion include:   Getting an infectious disease.  Developing a transfusion reaction. This is an allergic reaction to something in the blood you were given. Every precaution is taken to prevent this. The decision to have a blood transfusion has been considered carefully by your caregiver before blood is given. Blood is not given unless the benefits outweigh the risks. AFTER THE TRANSFUSION  Right after receiving a blood transfusion, you will usually feel much better and more energetic. This is especially true if your red blood cells have gotten low (anemic). The transfusion raises the level of the red blood cells which carry oxygen, and this usually causes an energy increase.  The nurse administering the transfusion will monitor you carefully for  complications. HOME CARE INSTRUCTIONS  No special instructions are needed after a transfusion. You may find your energy is better. Speak with your caregiver about any limitations on activity for underlying diseases you may have. SEEK MEDICAL CARE IF:   Your condition is not improving after your transfusion.  You develop redness or irritation at the intravenous (IV) site. SEEK IMMEDIATE MEDICAL CARE IF:  Any of the following symptoms occur over the next 12  hours:  Shaking chills.  You have a temperature by mouth above 102 F (38.9 C), not controlled by medicine.  Chest, back, or muscle pain.  People around you feel you are not acting correctly or are confused.  Shortness of breath or difficulty breathing.  Dizziness and fainting.  You get a rash or develop hives.  You have a decrease in urine output.  Your urine turns a dark color or changes to pink, red, or brown. Any of the following symptoms occur over the next 10 days:  You have a temperature by mouth above 102 F (38.9 C), not controlled by medicine.  Shortness of breath.  Weakness after normal activity.  The white part of the eye turns yellow (jaundice).  You have a decrease in the amount of urine or are urinating less often.  Your urine turns a dark color or changes to pink, red, or brown. Document Released: 11/15/2000 Document Revised: 02/10/2012 Document Reviewed: 07/04/2008 [facility] Patient Information 2014 [place], [place].  _______________________________________________________________________

## 2019-01-15 ENCOUNTER — Other Ambulatory Visit: Payer: Self-pay

## 2019-01-15 ENCOUNTER — Encounter (HOSPITAL_COMMUNITY)
Admission: RE | Admit: 2019-01-15 | Discharge: 2019-01-15 | Disposition: A | Discharge status: Home / Self Care | Payer: Medicaid Other | Source: Ambulatory Visit | Attending: Orthopedic Surgery | Admitting: Orthopedic Surgery

## 2019-01-15 ENCOUNTER — Encounter (HOSPITAL_COMMUNITY): Payer: Self-pay

## 2019-01-15 ENCOUNTER — Other Ambulatory Visit (HOSPITAL_COMMUNITY): Payer: Medicaid Other

## 2019-01-15 DIAGNOSIS — I10 Essential (primary) hypertension: Secondary | ICD-10-CM | POA: Insufficient documentation

## 2019-01-15 DIAGNOSIS — Z791 Long term (current) use of non-steroidal anti-inflammatories (NSAID): Secondary | ICD-10-CM | POA: Diagnosis not present

## 2019-01-15 DIAGNOSIS — E785 Hyperlipidemia, unspecified: Secondary | ICD-10-CM | POA: Diagnosis not present

## 2019-01-15 DIAGNOSIS — F329 Major depressive disorder, single episode, unspecified: Secondary | ICD-10-CM | POA: Insufficient documentation

## 2019-01-15 DIAGNOSIS — M545 Low back pain: Secondary | ICD-10-CM | POA: Insufficient documentation

## 2019-01-15 DIAGNOSIS — R202 Paresthesia of skin: Secondary | ICD-10-CM | POA: Diagnosis not present

## 2019-01-15 DIAGNOSIS — E039 Hypothyroidism, unspecified: Secondary | ICD-10-CM | POA: Diagnosis not present

## 2019-01-15 DIAGNOSIS — Z79899 Other long term (current) drug therapy: Secondary | ICD-10-CM | POA: Insufficient documentation

## 2019-01-15 DIAGNOSIS — F909 Attention-deficit hyperactivity disorder, unspecified type: Secondary | ICD-10-CM | POA: Diagnosis not present

## 2019-01-15 DIAGNOSIS — M109 Gout, unspecified: Secondary | ICD-10-CM | POA: Insufficient documentation

## 2019-01-15 DIAGNOSIS — M1611 Unilateral primary osteoarthritis, right hip: Secondary | ICD-10-CM | POA: Insufficient documentation

## 2019-01-15 DIAGNOSIS — K219 Gastro-esophageal reflux disease without esophagitis: Secondary | ICD-10-CM | POA: Insufficient documentation

## 2019-01-15 DIAGNOSIS — Z01818 Encounter for other preprocedural examination: Secondary | ICD-10-CM | POA: Insufficient documentation

## 2019-01-15 DIAGNOSIS — R200 Anesthesia of skin: Secondary | ICD-10-CM | POA: Insufficient documentation

## 2019-01-15 HISTORY — DX: Presence of dental prosthetic device (complete) (partial): Z97.2

## 2019-01-15 HISTORY — DX: Diaphragmatic hernia without obstruction or gangrene: K44.9

## 2019-01-15 HISTORY — DX: Sciatica, right side: M54.31

## 2019-01-15 HISTORY — DX: Other amnesia: R41.3

## 2019-01-15 HISTORY — DX: Personal history of other infectious and parasitic diseases: Z86.19

## 2019-01-15 HISTORY — DX: Unspecified osteoarthritis, unspecified site: M19.90

## 2019-01-15 HISTORY — DX: Obstructive sleep apnea (adult) (pediatric): G47.33

## 2019-01-15 HISTORY — DX: Personal history of other diseases of the digestive system: Z87.19

## 2019-01-15 HISTORY — DX: Paresthesia of skin: R20.2

## 2019-01-15 HISTORY — DX: Anesthesia of skin: R20.0

## 2019-01-15 HISTORY — DX: Attention-deficit hyperactivity disorder, unspecified type: F90.9

## 2019-01-15 HISTORY — DX: Personal history of traumatic brain injury: Z87.820

## 2019-01-15 HISTORY — DX: Diverticulosis of large intestine without perforation or abscess without bleeding: K57.30

## 2019-01-15 HISTORY — DX: Presence of spectacles and contact lenses: Z97.3

## 2019-01-15 HISTORY — DX: Hyperlipidemia, unspecified: E78.5

## 2019-01-15 LAB — CBC
HCT: 54.2 % — ABNORMAL HIGH (ref 39.0–52.0)
Hemoglobin: 17.5 g/dL — ABNORMAL HIGH (ref 13.0–17.0)
MCH: 28 pg (ref 26.0–34.0)
MCHC: 32.3 g/dL (ref 30.0–36.0)
MCV: 86.6 fL (ref 80.0–100.0)
Platelets: 218 10*3/uL (ref 150–400)
RBC: 6.26 MIL/uL — ABNORMAL HIGH (ref 4.22–5.81)
RDW: 13.3 % (ref 11.5–15.5)
WBC: 6.1 10*3/uL (ref 4.0–10.5)
nRBC: 0 % (ref 0.0–0.2)

## 2019-01-15 LAB — SURGICAL PCR SCREEN
MRSA, PCR: NEGATIVE
Staphylococcus aureus: NEGATIVE

## 2019-01-15 LAB — ABO/RH: ABO/RH(D): O POS

## 2019-01-15 LAB — BASIC METABOLIC PANEL
Anion gap: 14 (ref 5–15)
BUN: 13 mg/dL (ref 6–20)
CO2: 27 mmol/L (ref 22–32)
CREATININE: 1.09 mg/dL (ref 0.61–1.24)
Calcium: 9.8 mg/dL (ref 8.9–10.3)
Chloride: 95 mmol/L — ABNORMAL LOW (ref 98–111)
GFR calc Af Amer: >60 mL/min (ref >60)
GFR calc non Af Amer: >60 mL/min (ref >60)
Glucose, Bld: 111 mg/dL — ABNORMAL HIGH (ref 70–99)
Potassium: 4.4 mmol/L (ref 3.5–5.1)
Sodium: 136 mmol/L (ref 135–145)

## 2019-01-18 NOTE — Anesthesia Preprocedure Evaluation (Addendum)
Anesthesia Evaluation  Patient identified by MRN, date of birth, ID band Patient awake    Reviewed: Allergy & Precautions, NPO status , Patient's Chart, lab work & pertinent test results  Airway Mallampati: II  TM Distance: >3 FB Neck ROM: Full    Dental  (+) Dental Advisory Given   Pulmonary sleep apnea , Current Smoker,    breath sounds clear to auscultation       Cardiovascular hypertension, Pt. 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    GI/Hepatic Neg liver ROS, hiatal hernia, GERD  ,  Endo/Other  Hypothyroidism   Renal/GU Renal disease     Musculoskeletal  (+) Arthritis ,   Abdominal   Peds  Hematology negative hematology ROS (+)   Anesthesia Other Findings   Reproductive/Obstetrics                            Lab Results  Component Value Date   WBC 6.1 01/15/2019   HGB 17.5 (H) 01/15/2019   HCT 54.2 (H) 01/15/2019   MCV 86.6 01/15/2019   PLT 218 01/15/2019   Lab Results  Component Value Date   CREATININE 1.09 01/15/2019   BUN 13 01/15/2019   NA 136 01/15/2019   K 4.4 01/15/2019   CL 95 (L) 01/15/2019   CO2 27 01/15/2019    Anesthesia Physical Anesthesia Plan  ASA: II  Anesthesia Plan: Spinal and MAC   Post-op Pain Management:    Induction: Intravenous  PONV Risk Score and Plan: 0 and Propofol infusion, Ondansetron and Treatment may vary due to age or medical condition  Airway Management Planned: Natural Airway and Simple Face Mask  Additional Equipment:   Intra-op Plan:   Post-operative Plan:   Informed Consent: I have reviewed the patients History and Physical, chart, labs and discussed the procedure including the risks, benefits and alternatives for the proposed anesthesia with the patient or authorized representative who has indicated his/her understanding and acceptance.        Plan Discussed with: CRNA  Anesthesia Plan Comments:        Anesthesia Quick Evaluation
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
## 2019-01-18 NOTE — Progress Notes (Signed)
Final EKG dated 01-15-2019 in epic.

## 2019-01-19 ENCOUNTER — Inpatient Hospital Stay (HOSPITAL_COMMUNITY): Payer: Medicaid Other

## 2019-01-19 ENCOUNTER — Observation Stay (HOSPITAL_COMMUNITY): Payer: Medicaid Other

## 2019-01-19 ENCOUNTER — Other Ambulatory Visit: Payer: Self-pay

## 2019-01-19 ENCOUNTER — Inpatient Hospital Stay (HOSPITAL_COMMUNITY): Payer: Medicaid Other | Admitting: Anesthesiology

## 2019-01-19 ENCOUNTER — Encounter (HOSPITAL_COMMUNITY)
Admission: RE | Disposition: A | Discharge status: Home Health | Payer: Self-pay | Source: Home / Self Care | Attending: Orthopedic Surgery

## 2019-01-19 ENCOUNTER — Inpatient Hospital Stay (HOSPITAL_COMMUNITY): Payer: Medicaid Other | Admitting: Physician Assistant

## 2019-01-19 ENCOUNTER — Inpatient Hospital Stay (HOSPITAL_COMMUNITY)
Admission: RE | Admit: 2019-01-19 | Discharge: 2019-01-21 | DRG: 470 | Disposition: A | Discharge status: Home Health | Payer: Medicaid Other | Attending: Orthopedic Surgery | Admitting: Orthopedic Surgery

## 2019-01-19 ENCOUNTER — Encounter (HOSPITAL_COMMUNITY): Payer: Self-pay | Admitting: *Deleted

## 2019-01-19 DIAGNOSIS — F909 Attention-deficit hyperactivity disorder, unspecified type: Secondary | ICD-10-CM | POA: Diagnosis present

## 2019-01-19 DIAGNOSIS — E7800 Pure hypercholesterolemia, unspecified: Secondary | ICD-10-CM | POA: Diagnosis present

## 2019-01-19 DIAGNOSIS — E663 Overweight: Secondary | ICD-10-CM | POA: Diagnosis present

## 2019-01-19 DIAGNOSIS — M545 Low back pain: Secondary | ICD-10-CM | POA: Diagnosis present

## 2019-01-19 DIAGNOSIS — G4733 Obstructive sleep apnea (adult) (pediatric): Secondary | ICD-10-CM | POA: Diagnosis present

## 2019-01-19 DIAGNOSIS — M1611 Unilateral primary osteoarthritis, right hip: Principal | ICD-10-CM | POA: Diagnosis present

## 2019-01-19 DIAGNOSIS — K219 Gastro-esophageal reflux disease without esophagitis: Secondary | ICD-10-CM | POA: Diagnosis present

## 2019-01-19 DIAGNOSIS — Z96641 Presence of right artificial hip joint: Secondary | ICD-10-CM

## 2019-01-19 DIAGNOSIS — Z79899 Other long term (current) drug therapy: Secondary | ICD-10-CM

## 2019-01-19 DIAGNOSIS — M5431 Sciatica, right side: Secondary | ICD-10-CM | POA: Diagnosis present

## 2019-01-19 DIAGNOSIS — G8929 Other chronic pain: Secondary | ICD-10-CM | POA: Diagnosis present

## 2019-01-19 DIAGNOSIS — G43909 Migraine, unspecified, not intractable, without status migrainosus: Secondary | ICD-10-CM | POA: Diagnosis present

## 2019-01-19 DIAGNOSIS — Z79891 Long term (current) use of opiate analgesic: Secondary | ICD-10-CM

## 2019-01-19 DIAGNOSIS — F17210 Nicotine dependence, cigarettes, uncomplicated: Secondary | ICD-10-CM | POA: Diagnosis present

## 2019-01-19 DIAGNOSIS — I959 Hypotension, unspecified: Secondary | ICD-10-CM | POA: Diagnosis not present

## 2019-01-19 DIAGNOSIS — E039 Hypothyroidism, unspecified: Secondary | ICD-10-CM | POA: Diagnosis present

## 2019-01-19 DIAGNOSIS — E785 Hyperlipidemia, unspecified: Secondary | ICD-10-CM | POA: Diagnosis present

## 2019-01-19 DIAGNOSIS — D62 Acute posthemorrhagic anemia: Secondary | ICD-10-CM | POA: Diagnosis not present

## 2019-01-19 DIAGNOSIS — M1990 Unspecified osteoarthritis, unspecified site: Secondary | ICD-10-CM | POA: Diagnosis present

## 2019-01-19 DIAGNOSIS — Z6829 Body mass index (BMI) 29.0-29.9, adult: Secondary | ICD-10-CM

## 2019-01-19 DIAGNOSIS — Z419 Encounter for procedure for purposes other than remedying health state, unspecified: Secondary | ICD-10-CM

## 2019-01-19 DIAGNOSIS — Z96649 Presence of unspecified artificial hip joint: Secondary | ICD-10-CM

## 2019-01-19 DIAGNOSIS — Z882 Allergy status to sulfonamides status: Secondary | ICD-10-CM

## 2019-01-19 DIAGNOSIS — Z79890 Hormone replacement therapy (postmenopausal): Secondary | ICD-10-CM

## 2019-01-19 DIAGNOSIS — F329 Major depressive disorder, single episode, unspecified: Secondary | ICD-10-CM | POA: Diagnosis present

## 2019-01-19 DIAGNOSIS — I10 Essential (primary) hypertension: Secondary | ICD-10-CM | POA: Diagnosis present

## 2019-01-19 HISTORY — PX: TOTAL HIP ARTHROPLASTY: SHX124

## 2019-01-19 LAB — GLUCOSE, CAPILLARY: Glucose-Capillary: 86 mg/dL (ref 70–99)

## 2019-01-19 LAB — TYPE AND SCREEN
ABO/RH(D): O POS
Antibody Screen: NEGATIVE

## 2019-01-19 SURGERY — ARTHROPLASTY, HIP, TOTAL, ANTERIOR APPROACH
Anesthesia: Monitor Anesthesia Care | Laterality: Right

## 2019-01-19 MED ORDER — DIPHENHYDRAMINE HCL 12.5 MG/5ML PO ELIX
12.5000 mg | ORAL_SOLUTION | ORAL | Status: DC | PRN
  Administered 2019-01-20: 25 mg via ORAL
  Filled 2019-01-19: qty 10

## 2019-01-19 MED ORDER — DOCUSATE SODIUM 100 MG PO CAPS
100.0000 mg | ORAL_CAPSULE | Freq: Two times a day (BID) | ORAL | Status: DC
  Administered 2019-01-19 – 2019-01-21 (×4): 100 mg via ORAL
  Filled 2019-01-19 (×4): qty 1

## 2019-01-19 MED ORDER — BISACODYL 10 MG RE SUPP: 10.0000 mg | Freq: Every day | RECTAL | Status: DC | PRN

## 2019-01-19 MED ORDER — FENTANYL CITRATE (PF) 250 MCG/5ML IJ SOLN
INTRAMUSCULAR | Status: DC | PRN
  Administered 2019-01-19 (×5): 50 ug via INTRAVENOUS

## 2019-01-19 MED ORDER — MENTHOL 3 MG MT LOZG: 1.0000 | LOZENGE | OROMUCOSAL | Status: DC | PRN

## 2019-01-19 MED ORDER — POLYETHYLENE GLYCOL 3350 17 G PO PACK: 17.0000 g | PACK | Freq: Two times a day (BID) | ORAL | 0 refills | Status: DC

## 2019-01-19 MED ORDER — ALBUMIN HUMAN 5 % IV SOLN
INTRAVENOUS | Status: DC | PRN
  Administered 2019-01-19 (×2): via INTRAVENOUS

## 2019-01-19 MED ORDER — ONDANSETRON HCL 4 MG/2ML IJ SOLN
4.0000 mg | Freq: Four times a day (QID) | INTRAMUSCULAR | Status: DC | PRN
  Administered 2019-01-20: 4 mg via INTRAVENOUS
  Filled 2019-01-19: qty 2

## 2019-01-19 MED ORDER — MAGNESIUM CITRATE PO SOLN: 1.0000 | Freq: Once | ORAL | Status: DC | PRN

## 2019-01-19 MED ORDER — CYCLOBENZAPRINE HCL 10 MG PO TABS: 10.0000 mg | ORAL_TABLET | Freq: Three times a day (TID) | ORAL | 0 refills | Status: DC | PRN

## 2019-01-19 MED ORDER — HYDROCHLOROTHIAZIDE 12.5 MG PO CAPS
12.5000 mg | ORAL_CAPSULE | Freq: Every day | ORAL | Status: DC
  Administered 2019-01-19: 12.5 mg via ORAL
  Filled 2019-01-19 (×2): qty 1

## 2019-01-19 MED ORDER — LOSARTAN POTASSIUM-HCTZ 100-12.5 MG PO TABS: 1.0000 | ORAL_TABLET | Freq: Every day | ORAL | Status: DC

## 2019-01-19 MED ORDER — BUPIVACAINE IN DEXTROSE 0.75-8.25 % IT SOLN
INTRATHECAL | Status: DC | PRN
  Administered 2019-01-19: 2 mL via INTRATHECAL

## 2019-01-19 MED ORDER — STERILE WATER FOR IRRIGATION IR SOLN
Status: DC | PRN
  Administered 2019-01-19: 2000 mL

## 2019-01-19 MED ORDER — FENTANYL CITRATE (PF) 100 MCG/2ML IJ SOLN
INTRAMUSCULAR | Status: AC
  Filled 2019-01-19: qty 2

## 2019-01-19 MED ORDER — FERROUS SULFATE 325 (65 FE) MG PO TABS: 325.0000 mg | ORAL_TABLET | Freq: Three times a day (TID) | ORAL | 3 refills | Status: DC

## 2019-01-19 MED ORDER — FENTANYL CITRATE (PF) 250 MCG/5ML IJ SOLN
INTRAMUSCULAR | Status: AC
  Filled 2019-01-19: qty 5

## 2019-01-19 MED ORDER — HYDROCODONE-ACETAMINOPHEN 5-325 MG PO TABS
1.0000 | ORAL_TABLET | ORAL | Status: DC | PRN
  Administered 2019-01-19 (×2): 2 via ORAL
  Filled 2019-01-19 (×2): qty 2

## 2019-01-19 MED ORDER — PROPOFOL 500 MG/50ML IV EMUL
INTRAVENOUS | Status: DC | PRN
  Administered 2019-01-19: 125 ug/kg/min via INTRAVENOUS

## 2019-01-19 MED ORDER — CARBAMAZEPINE 200 MG PO TABS
100.0000 mg | ORAL_TABLET | Freq: Three times a day (TID) | ORAL | Status: DC
  Administered 2019-01-19 – 2019-01-21 (×6): 100 mg via ORAL
  Filled 2019-01-19 (×8): qty 0.5

## 2019-01-19 MED ORDER — CHLORHEXIDINE GLUCONATE 4 % EX LIQD: 60.0000 mL | Freq: Once | CUTANEOUS | Status: DC

## 2019-01-19 MED ORDER — FERROUS SULFATE 325 (65 FE) MG PO TABS
325.0000 mg | ORAL_TABLET | Freq: Three times a day (TID) | ORAL | Status: DC
  Administered 2019-01-19 – 2019-01-21 (×5): 325 mg via ORAL
  Filled 2019-01-19 (×5): qty 1

## 2019-01-19 MED ORDER — ALBUMIN HUMAN 5 % IV SOLN
INTRAVENOUS | Status: AC
  Filled 2019-01-19: qty 500

## 2019-01-19 MED ORDER — ONDANSETRON HCL 4 MG PO TABS: 4.0000 mg | ORAL_TABLET | Freq: Four times a day (QID) | ORAL | Status: DC | PRN

## 2019-01-19 MED ORDER — MIDAZOLAM HCL 2 MG/2ML IJ SOLN
INTRAMUSCULAR | Status: AC
  Filled 2019-01-19: qty 2

## 2019-01-19 MED ORDER — ASPIRIN 81 MG PO CHEW
81.0000 mg | CHEWABLE_TABLET | Freq: Two times a day (BID) | ORAL | Status: DC
  Administered 2019-01-19 – 2019-01-21 (×4): 81 mg via ORAL
  Filled 2019-01-19 (×4): qty 1

## 2019-01-19 MED ORDER — ALUM & MAG HYDROXIDE-SIMETH 200-200-20 MG/5ML PO SUSP
15.0000 mL | ORAL | Status: DC | PRN
  Administered 2019-01-19 – 2019-01-20 (×3): 15 mL via ORAL
  Filled 2019-01-19 (×4): qty 30

## 2019-01-19 MED ORDER — DEXAMETHASONE SODIUM PHOSPHATE 10 MG/ML IJ SOLN: 10.0000 mg | Freq: Once | INTRAMUSCULAR | Status: DC

## 2019-01-19 MED ORDER — SODIUM CHLORIDE 0.9 % IR SOLN
Status: DC | PRN
  Administered 2019-01-19: 1000 mL

## 2019-01-19 MED ORDER — PANTOPRAZOLE SODIUM 40 MG PO TBEC
40.0000 mg | DELAYED_RELEASE_TABLET | Freq: Every day | ORAL | Status: DC
  Administered 2019-01-19 – 2019-01-20 (×3): 40 mg via ORAL
  Filled 2019-01-19 (×3): qty 1

## 2019-01-19 MED ORDER — CEFAZOLIN SODIUM-DEXTROSE 2-4 GM/100ML-% IV SOLN
2.0000 g | Freq: Four times a day (QID) | INTRAVENOUS | Status: AC
  Administered 2019-01-19 (×2): 2 g via INTRAVENOUS
  Filled 2019-01-19 (×2): qty 100

## 2019-01-19 MED ORDER — CEFAZOLIN SODIUM-DEXTROSE 2-4 GM/100ML-% IV SOLN
2.0000 g | INTRAVENOUS | Status: AC
  Administered 2019-01-19: 2 g via INTRAVENOUS
  Filled 2019-01-19: qty 100

## 2019-01-19 MED ORDER — METHOCARBAMOL 500 MG IVPB - SIMPLE MED
INTRAVENOUS | Status: AC
  Filled 2019-01-19: qty 50

## 2019-01-19 MED ORDER — AMPHETAMINE-DEXTROAMPHETAMINE 10 MG PO TABS
20.0000 mg | ORAL_TABLET | Freq: Every day | ORAL | Status: DC
  Filled 2019-01-19: qty 2

## 2019-01-19 MED ORDER — PROPOFOL 10 MG/ML IV BOLUS
INTRAVENOUS | Status: DC | PRN
  Administered 2019-01-19 (×3): 20 mg via INTRAVENOUS
  Administered 2019-01-19: 10 mg via INTRAVENOUS

## 2019-01-19 MED ORDER — METHOCARBAMOL 500 MG PO TABS
500.0000 mg | ORAL_TABLET | Freq: Four times a day (QID) | ORAL | Status: DC | PRN
  Administered 2019-01-19 – 2019-01-20 (×4): 500 mg via ORAL
  Filled 2019-01-19 (×4): qty 1

## 2019-01-19 MED ORDER — LEVOTHYROXINE SODIUM 112 MCG PO TABS
112.0000 ug | ORAL_TABLET | Freq: Every day | ORAL | Status: DC
  Administered 2019-01-20 – 2019-01-21 (×2): 112 ug via ORAL
  Filled 2019-01-19 (×2): qty 1

## 2019-01-19 MED ORDER — DEXAMETHASONE SODIUM PHOSPHATE 10 MG/ML IJ SOLN
10.0000 mg | Freq: Once | INTRAMUSCULAR | Status: DC
  Filled 2019-01-19: qty 1

## 2019-01-19 MED ORDER — HYDROCODONE-ACETAMINOPHEN 7.5-325 MG PO TABS: 1.0000 | ORAL_TABLET | ORAL | 0 refills | Status: DC | PRN

## 2019-01-19 MED ORDER — FENTANYL CITRATE (PF) 250 MCG/5ML IJ SOLN: INTRAMUSCULAR | Status: DC | PRN

## 2019-01-19 MED ORDER — MIDAZOLAM HCL 2 MG/2ML IJ SOLN
INTRAMUSCULAR | Status: DC | PRN
  Administered 2019-01-19: 2 mg via INTRAVENOUS

## 2019-01-19 MED ORDER — PHENYLEPHRINE 40 MCG/ML (10ML) SYRINGE FOR IV PUSH (FOR BLOOD PRESSURE SUPPORT)
PREFILLED_SYRINGE | INTRAVENOUS | Status: DC | PRN
  Administered 2019-01-19 (×2): 120 ug via INTRAVENOUS

## 2019-01-19 MED ORDER — TRANEXAMIC ACID-NACL 1000-0.7 MG/100ML-% IV SOLN
1000.0000 mg | INTRAVENOUS | Status: AC
  Administered 2019-01-19: 1000 mg via INTRAVENOUS
  Filled 2019-01-19: qty 100

## 2019-01-19 MED ORDER — FENTANYL CITRATE (PF) 100 MCG/2ML IJ SOLN
25.0000 ug | INTRAMUSCULAR | Status: DC | PRN
  Administered 2019-01-19 (×2): 50 ug via INTRAVENOUS

## 2019-01-19 MED ORDER — ONDANSETRON HCL 4 MG/2ML IJ SOLN
INTRAMUSCULAR | Status: DC | PRN
  Administered 2019-01-19: 4 mg via INTRAVENOUS

## 2019-01-19 MED ORDER — DULOXETINE HCL 60 MG PO CPEP
60.0000 mg | ORAL_CAPSULE | Freq: Every day | ORAL | Status: DC
  Administered 2019-01-20 – 2019-01-21 (×2): 60 mg via ORAL
  Filled 2019-01-19 (×2): qty 1

## 2019-01-19 MED ORDER — DOCUSATE SODIUM 100 MG PO CAPS: 100.0000 mg | ORAL_CAPSULE | Freq: Two times a day (BID) | ORAL | 0 refills | Status: DC

## 2019-01-19 MED ORDER — FENTANYL CITRATE (PF) 100 MCG/2ML IJ SOLN
INTRAMUSCULAR | Status: DC | PRN
  Administered 2019-01-19: 75 ug via INTRAVENOUS
  Administered 2019-01-19: 25 ug via INTRAVENOUS

## 2019-01-19 MED ORDER — SODIUM CHLORIDE 0.9 % IV SOLN
INTRAVENOUS | Status: DC
  Administered 2019-01-19 – 2019-01-20 (×3): via INTRAVENOUS

## 2019-01-19 MED ORDER — TRANEXAMIC ACID-NACL 1000-0.7 MG/100ML-% IV SOLN
1000.0000 mg | Freq: Once | INTRAVENOUS | Status: AC
  Administered 2019-01-19: 1000 mg via INTRAVENOUS
  Filled 2019-01-19: qty 100

## 2019-01-19 MED ORDER — METHOCARBAMOL 500 MG IVPB - SIMPLE MED
500.0000 mg | Freq: Four times a day (QID) | INTRAVENOUS | Status: DC | PRN
  Filled 2019-01-19: qty 50

## 2019-01-19 MED ORDER — POLYETHYLENE GLYCOL 3350 17 G PO PACK
17.0000 g | PACK | Freq: Two times a day (BID) | ORAL | Status: DC
  Administered 2019-01-19 – 2019-01-21 (×4): 17 g via ORAL
  Filled 2019-01-19 (×4): qty 1

## 2019-01-19 MED ORDER — LACTATED RINGERS IV SOLN
INTRAVENOUS | Status: DC
  Administered 2019-01-19 (×3): via INTRAVENOUS

## 2019-01-19 MED ORDER — ACETAMINOPHEN 325 MG PO TABS
325.0000 mg | ORAL_TABLET | Freq: Four times a day (QID) | ORAL | Status: DC | PRN
  Administered 2019-01-20 (×3): 650 mg via ORAL
  Filled 2019-01-19 (×3): qty 2

## 2019-01-19 MED ORDER — METOCLOPRAMIDE HCL 5 MG/ML IJ SOLN: 5.0000 mg | Freq: Three times a day (TID) | INTRAMUSCULAR | Status: DC | PRN

## 2019-01-19 MED ORDER — DEXAMETHASONE SODIUM PHOSPHATE 10 MG/ML IJ SOLN
INTRAMUSCULAR | Status: DC | PRN
  Administered 2019-01-19: 10 mg via INTRAVENOUS

## 2019-01-19 MED ORDER — HYDROMORPHONE HCL 1 MG/ML IJ SOLN
0.5000 mg | INTRAMUSCULAR | Status: DC | PRN
  Administered 2019-01-19 – 2019-01-20 (×3): 1 mg via INTRAVENOUS
  Filled 2019-01-19 (×3): qty 1

## 2019-01-19 MED ORDER — PHENOL 1.4 % MT LIQD
1.0000 | OROMUCOSAL | Status: DC | PRN
  Filled 2019-01-19: qty 177

## 2019-01-19 MED ORDER — LOSARTAN POTASSIUM 50 MG PO TABS
100.0000 mg | ORAL_TABLET | Freq: Every day | ORAL | Status: DC
  Administered 2019-01-19: 100 mg via ORAL
  Filled 2019-01-19 (×2): qty 2

## 2019-01-19 MED ORDER — HYDROCODONE-ACETAMINOPHEN 7.5-325 MG PO TABS
1.0000 | ORAL_TABLET | ORAL | Status: DC | PRN
  Administered 2019-01-19 – 2019-01-20 (×2): 2 via ORAL
  Filled 2019-01-19 (×2): qty 2

## 2019-01-19 MED ORDER — PROMETHAZINE HCL 25 MG/ML IJ SOLN: 6.2500 mg | INTRAMUSCULAR | Status: DC | PRN

## 2019-01-19 MED ORDER — METOCLOPRAMIDE HCL 5 MG PO TABS: 5.0000 mg | ORAL_TABLET | Freq: Three times a day (TID) | ORAL | Status: DC | PRN

## 2019-01-19 MED ORDER — PHENYLEPHRINE 40 MCG/ML (10ML) SYRINGE FOR IV PUSH (FOR BLOOD PRESSURE SUPPORT)
PREFILLED_SYRINGE | INTRAVENOUS | Status: AC
  Filled 2019-01-19: qty 10

## 2019-01-19 MED ORDER — ASPIRIN 81 MG PO CHEW: 81.0000 mg | CHEWABLE_TABLET | Freq: Two times a day (BID) | ORAL | 0 refills | Status: AC

## 2019-01-19 SURGICAL SUPPLY — 42 items
BAG DECANTER FOR FLEXI CONT (MISCELLANEOUS) IMPLANT
BAG ZIPLOCK 12X15 (MISCELLANEOUS) IMPLANT
BLADE SAG 18X100X1.27 (BLADE) ×2 IMPLANT
BLADE SURG SZ10 CARB STEEL (BLADE) ×4 IMPLANT
COVER PERINEAL POST (MISCELLANEOUS) ×2 IMPLANT
COVER SURGICAL LIGHT HANDLE (MISCELLANEOUS) ×2 IMPLANT
COVER WAND RF STERILE (DRAPES) IMPLANT
CUP ACETBLR 54 OD PINNACLE (Hips) ×2 IMPLANT
DERMABOND ADVANCED (GAUZE/BANDAGES/DRESSINGS) ×1
DERMABOND ADVANCED .7 DNX12 (GAUZE/BANDAGES/DRESSINGS) ×1 IMPLANT
DRAPE STERI IOBAN 125X83 (DRAPES) ×2 IMPLANT
DRAPE U-SHAPE 47X51 STRL (DRAPES) ×4 IMPLANT
DRESSING AQUACEL AG SP 3.5X10 (GAUZE/BANDAGES/DRESSINGS) ×1 IMPLANT
DRSG AQUACEL AG ADV 3.5X10 (GAUZE/BANDAGES/DRESSINGS) ×2 IMPLANT
DRSG AQUACEL AG SP 3.5X10 (GAUZE/BANDAGES/DRESSINGS) ×2
DURAPREP 26ML APPLICATOR (WOUND CARE) ×2 IMPLANT
ELECT REM PT RETURN 15FT ADLT (MISCELLANEOUS) ×2 IMPLANT
ELIMINATOR HOLE APEX DEPUY (Hips) ×2 IMPLANT
GLOVE BIOGEL M STRL SZ7.5 (GLOVE) IMPLANT
GLOVE BIOGEL PI IND STRL 7.5 (GLOVE) ×1 IMPLANT
GLOVE BIOGEL PI IND STRL 8.5 (GLOVE) ×1 IMPLANT
GLOVE BIOGEL PI INDICATOR 7.5 (GLOVE) ×1
GLOVE BIOGEL PI INDICATOR 8.5 (GLOVE) ×1
GLOVE ECLIPSE 8.0 STRL XLNG CF (GLOVE) ×4 IMPLANT
GLOVE ORTHO TXT STRL SZ7.5 (GLOVE) ×2 IMPLANT
GOWN STRL REUS W/TWL 2XL LVL3 (GOWN DISPOSABLE) ×2 IMPLANT
GOWN STRL REUS W/TWL LRG LVL3 (GOWN DISPOSABLE) ×2 IMPLANT
HEAD CERAMIC 36 PLUS5 (Hips) ×2 IMPLANT
HOLDER FOLEY CATH W/STRAP (MISCELLANEOUS) ×2 IMPLANT
LINER NEUTRAL 54X36MM PLUS 4 (Hips) ×2 IMPLANT
PACK ANTERIOR HIP CUSTOM (KITS) ×2 IMPLANT
SCREW 6.5MMX30MM (Screw) ×2 IMPLANT
STEM FEM ACTIS HIGH SZ7 (Stem) ×2 IMPLANT
SUT MNCRL AB 4-0 PS2 18 (SUTURE) ×2 IMPLANT
SUT STRATAFIX 0 PDS 27 VIOLET (SUTURE) ×2
SUT VIC AB 1 CT1 36 (SUTURE) ×6 IMPLANT
SUT VIC AB 2-0 CT1 27 (SUTURE) ×2
SUT VIC AB 2-0 CT1 TAPERPNT 27 (SUTURE) ×2 IMPLANT
SUTURE STRATFX 0 PDS 27 VIOLET (SUTURE) ×1 IMPLANT
TRAY FOLEY MTR SLVR 16FR STAT (SET/KITS/TRAYS/PACK) IMPLANT
WATER STERILE IRR 1000ML POUR (IV SOLUTION) ×4 IMPLANT
YANKAUER SUCT BULB TIP 10FT TU (MISCELLANEOUS) IMPLANT

## 2019-01-19 NOTE — Anesthesia Postprocedure Evaluation (Signed)
Anesthesia Post Note  Patient: Douglas Wiggins  Procedure(s) Performed: TOTAL HIP ARTHROPLASTY ANTERIOR APPROACH (Right )     Patient location during evaluation: PACU Anesthesia Type: Spinal Level of consciousness: awake and alert Pain management: pain level controlled Vital Signs Assessment: post-procedure vital signs reviewed and stable Respiratory status: spontaneous breathing and respiratory function stable Cardiovascular status: blood pressure returned to baseline and stable Postop Assessment: spinal receding Anesthetic complications: no    Last Vitals:  Vitals:   01/19/19 1418 01/19/19 1532  BP: 117/72 (!) 141/99  Pulse: 72 86  Resp: 17 16  Temp: 36.6 C 37.3 C  SpO2: 94% 98%    Last Pain:  Vitals:   01/19/19 1532  TempSrc: Oral  PainSc:                  Tiajuana Amass

## 2019-01-19 NOTE — Interval H&P Note (Signed)
History and Physical Interval Note:  01/19/2019 10:05 AM  Douglas Wiggins  has presented today for surgery, with the diagnosis of Right hip osteoarthritis  The various methods of treatment have been discussed with the patient and family. After consideration of risks, benefits and other options for treatment, the patient has consented to  Procedure(s) with comments: TOTAL HIP ARTHROPLASTY ANTERIOR APPROACH (Right) - 70 mins as a surgical intervention .  The patient's history has been reviewed, patient examined, no change in status, stable for surgery.  I have reviewed the patient's chart and labs.  Questions were answered to the patient's satisfaction.     Mauri Pole

## 2019-01-19 NOTE — Transfer of Care (Signed)
Immediate Anesthesia Transfer of Care Note  Patient: Douglas Wiggins  Procedure(s) Performed: TOTAL HIP ARTHROPLASTY ANTERIOR APPROACH (Right )  Patient Location: PACU  Anesthesia Type:Spinal  Level of Consciousness: awake and alert   Airway & Oxygen Therapy: Patient Spontanous Breathing and Patient connected to face mask oxygen  Post-op Assessment: Report given to RN and Post -op Vital signs reviewed and stable  Post vital signs: Reviewed and stable  Last Vitals:  Vitals Value Taken Time  BP 132/100 01/19/2019 12:50 PM  Temp    Pulse 68 01/19/2019 12:52 PM  Resp 21 01/19/2019 12:52 PM  SpO2 100 % 01/19/2019 12:52 PM  Vitals shown include unvalidated device data.  Last Pain:  Vitals:   01/19/19 0920  TempSrc: Oral         Complications: No apparent anesthesia complications

## 2019-01-19 NOTE — Anesthesia Procedure Notes (Signed)
Date/Time: 01/19/2019 11:05 AM Performed by: Cynda Familia, CRNA Pre-anesthesia Checklist: Patient identified, Emergency Drugs available, Suction available, Patient being monitored and Timeout performed Patient Re-evaluated:Patient Re-evaluated prior to induction Oxygen Delivery Method: Simple face mask Placement Confirmation: positive ETCO2 and breath sounds checked- equal and bilateral Dental Injury: Teeth and Oropharynx as per pre-operative assessment  Comments: O2 for sedation/spinal

## 2019-01-19 NOTE — Discharge Instructions (Signed)

## 2019-01-19 NOTE — Anesthesia Procedure Notes (Signed)
Spinal  Patient location during procedure: OR Start time: 01/19/2019 11:08 AM End time: 01/19/2019 11:13 AM Staffing Anesthesiologist: Suzette Battiest, MD Performed: anesthesiologist  Preanesthetic Checklist Completed: patient identified, site marked, surgical consent, pre-op evaluation, timeout performed, IV checked, risks and benefits discussed and monitors and equipment checked Spinal Block Patient position: sitting Prep: DuraPrep Patient monitoring: heart rate, cardiac monitor, continuous pulse ox and blood pressure Approach: midline Location: L3-4 Injection technique: single-shot Needle Needle type: Pencan  Needle gauge: 24 G Needle length: 9 cm Assessment Sensory level: T4

## 2019-01-19 NOTE — Evaluation (Signed)
Physical Therapy Evaluation Patient Details Name: NASEER HEARN MRN: [id number] DOB: 1965-12-15 Today's Date: 01/19/2019   History of Present Illness  53 YO S/P R direct anterior THA. HX TBI, Abominal surgery  Clinical Impression  The patient reported legs felt numb and was nauseated after IV meds. Patient slightly clammy. Stood and took a few steps along the bed. Patient could benefit from a few [place] visits. Pt admitted with above diagnosis. Pt currently with functional limitations due to the deficits listed below (see PT Problem List).  Pt will benefit from skilled PT to increase their independence and safety with mobility to allow discharge to the venue listed below.       Follow Up Recommendations (can benefit from HHPT a few visits)    Equipment Recommendations  None recommended by PT    Recommendations for Other Services OT consult     Precautions / Restrictions Precautions Precautions: Fall Restrictions Weight Bearing Restrictions: No      Mobility  Bed Mobility Overal bed mobility: Needs Assistance Bed Mobility: Supine to Sit;Sit to Supine     Supine to sit: Min assist;HOB elevated Sit to supine: Min assist;HOB elevated   General bed mobility comments: assist with right leg,   Transfers Overall transfer level: Needs assistance Equipment used: Rolling walker (2 wheeled) Transfers: Sit to/from Stand Sit to Stand: Mod assist;+2 physical assistance;+2 safety/equipment;From elevated surface         General transfer comment: Patient reported numbness of the legs. Stood and took 3 side steps along the bed.  Ambulation/Gait                Stairs            Wheelchair Mobility    Modified Rankin (Stroke Patients Only)       Balance                                             Pertinent Vitals/Pain Pain Assessment: Faces Faces Pain Scale: Hurts even more Pain Location: right thigh Pain Descriptors / Indicators:  Aching;Cramping;Discomfort Pain Intervention(s): Premedicated before session;Monitored during session;Ice applied;Limited activity within patient's tolerance    Home Living Family/patient expects to be discharged to:: Private residence Living Arrangements: Parent Available Help at Discharge: Family Type of Home: House Home Access: Stairs to enter   Technical brewer of Steps: 1 Home Layout: One level Home Equipment: Environmental consultant - 2 wheels      Prior Function Level of Independence: Independent               Hand Dominance        Extremity/Trunk Assessment   Upper Extremity Assessment Upper Extremity Assessment: Defer to OT evaluation    Lower Extremity Assessment Lower Extremity Assessment: RLE deficits/detail RLE Deficits / Details: able to flex the leg in supine       Communication   Communication: No difficulties  Cognition Arousal/Alertness: Awake/alert Behavior During Therapy: WFL for tasks assessed/performed Overall Cognitive Status: History of cognitive impairments - at baseline Area of Impairment: Safety/judgement;Awareness;Memory                         Safety/Judgement: Decreased awareness of safety Awareness: Emergent   General Comments: frequent cues for safety.      General Comments      Exercises Total Joint Exercises Ankle Circles/Pumps: AROM;Both;5 reps  Heel Slides: AROM;AAROM;Right;5 reps   Assessment/Plan    PT Assessment Patient needs continued PT services  PT Problem List Decreased strength;Decreased range of motion;Decreased activity tolerance;Decreased balance;Decreased mobility;Decreased knowledge of precautions;Decreased safety awareness;Pain       PT Treatment Interventions DME instruction;Gait training;Functional mobility training;Stair training;Therapeutic activities;Patient/family education;Therapeutic exercise    PT Goals (Current goals can be found in the Care Plan section)  Acute Rehab PT Goals Patient  Stated Goal: to drive in 2 days PT Goal Formulation: With patient Time For Goal Achievement: 01/26/19 Potential to Achieve Goals: Good    Frequency 7X/week   Barriers to discharge        Co-evaluation               AM-PAC PT "6 Clicks" Mobility  Outcome Measure Help needed turning from your back to your side while in a flat bed without using bedrails?: A Little Help needed moving from lying on your back to sitting on the side of a flat bed without using bedrails?: A Little Help needed moving to and from a bed to a chair (including a wheelchair)?: Total Help needed standing up from a chair using your arms (e.g., wheelchair or bedside chair)?: Total Help needed to walk in hospital room?: Total Help needed climbing 3-5 steps with a railing? : Total 6 Click Score: 10    End of Session Equipment Utilized During Treatment: Gait belt Activity Tolerance: Treatment limited secondary to medical complications (Comment)(nausea and numbness) Patient left: in bed;with call bell/phone within reach;with bed alarm set Nurse Communication: Mobility status PT Visit Diagnosis: Unsteadiness on feet (R26.81);Pain Pain - Right/Left: Right Pain - part of body: Hip    Time: 7322-0254 PT Time Calculation (min) (ACUTE ONLY): 20 min   Charges:   PT Evaluation $PT Eval Low Complexity: [place] PT Acute Rehabilitation Services Pager [phone] Office [phone]   Claretha Cooper 01/19/2019, 6:06 PM

## 2019-01-19 NOTE — Op Note (Signed)
NAME:  Douglas Wiggins                ACCOUNT NO.: [id number]      MEDICAL RECORD NO.: [id number]      FACILITY:  [facility]      PHYSICIAN:  Mauri Pole  DATE OF BIRTH:  Dec 17, 1965     DATE OF PROCEDURE:  01/19/2019                                 OPERATIVE REPORT         PREOPERATIVE DIAGNOSIS: Right  hip osteoarthritis.      POSTOPERATIVE DIAGNOSIS:  Right hip osteoarthritis.      PROCEDURE:  Right total hip replacement through an anterior approach   utilizing DePuy THR system, component size 15mm pinnacle cup, a size 36+4 neutral   Altrex liner, a size 7 Hi Actis Tri Lock stem with a 36+5 delta ceramic   ball.      SURGEON:  Pietro Cassis. Alvan Dame, M.D.      ASSISTANT:  Danae Orleans, PA-C     ANESTHESIA:  Spinal.      SPECIMENS:  None.      COMPLICATIONS:  None.      BLOOD LOSS:  1000 cc     DRAINS:  None.      INDICATION OF THE PROCEDURE:  Douglas Wiggins is a 53 y.o. male who had   presented to office for evaluation of right hip pain.  Radiographs revealed   progressive degenerative changes with bone-on-bone   articulation of the  hip joint, including subchondral cystic changes and osteophytes.  The patient had painful limited range of   motion significantly affecting their overall quality of life and function.  The patient was failing to    respond to conservative measures including medications and/or injections and activity modification and at this point was ready   to proceed with more definitive measures.  Consent was obtained for   benefit of pain relief.  Specific risks of infection, DVT, component   failure, dislocation, neurovascular injury, and need for revision surgery were reviewed in the office as well discussion of   the anterior versus posterior approach were reviewed.     PROCEDURE IN DETAIL:  The patient was brought to operative theater.   Once adequate anesthesia, preoperative antibiotics, 2 gm of Ancef, 1 gm of Tranexamic Acid, and  10 mg of Decadron were administered, the patient was positioned supine on the [employer] table.  Once the patient was safely positioned with adequate padding of boney prominences we predraped out the hip, and used fluoroscopy to confirm orientation of the pelvis.      The right hip was then prepped and draped from proximal iliac crest to   mid thigh with a shower curtain technique.      Time-out was performed identifying the patient, planned procedure, and the appropriate extremity.     An incision was then made 2 cm lateral to the   anterior superior iliac spine extending over the orientation of the   tensor fascia lata muscle and sharp dissection was carried down to the   fascia of the muscle.      The fascia was then incised.  The muscle belly was identified and swept   laterally and retractor placed along the superior neck.  Following   cauterization of the circumflex vessels and removing  some pericapsular   fat, a second cobra retractor was placed on the inferior neck.  A T-capsulotomy was made along the line of the   superior neck to the trochanteric fossa, then extended proximally and   distally.  Tag sutures were placed and the retractors were then placed   intracapsular.  We then identified the trochanteric fossa and   orientation of my neck cut and then made a neck osteotomy with the femur on traction.  The femoral   head was removed without difficulty or complication.  Traction was let   off and retractors were placed posterior and anterior around the   acetabulum.      The labrum and foveal tissue were debrided.  I began reaming with a 46 mm   reamer and reamed up to 53 mm reamer with good bony bed preparation and a 54 mm  cup was chosen.  The final 54 mm Pinnacle cup was then impacted under fluoroscopy to confirm the depth of penetration and orientation with respect to   Abduction and forward flexion.  A screw was placed into the ilium followed by the hole eliminator.  The  final   36+4 neutral Altrex liner was impacted with good visualized rim fit.  The cup was positioned anatomically within the acetabular portion of the pelvis.      At this point, the femur was rolled to 100 degrees.  Further capsule was   released off the inferior aspect of the femoral neck.  I then   released the superior capsule proximally.  With the leg in a neutral position the hook was placed laterally   along the femur under the vastus lateralis origin and elevated manually and then held in position using the hook attachment on the bed.  The leg was then extended and adducted with the leg rolled to 100   degrees of external rotation.  Retractors were placed along the medial calcar and posteriorly over the greater trochanter.  Once the proximal femur was fully   exposed, I used a box osteotome to set orientation.  I then began   broaching with the starting chili pepper broach and passed this by hand and then broached up to 7.  With the 7 broach in place I chose a high offset neck and did several trial reductions.  The offset was appropriate, leg lengths   appeared to be equal best matched with the +5 head ball trial confirmed radiographically.   Given these findings, I went ahead and dislocated the hip, repositioned all   retractors and positioned the right hip in the extended and abducted position.  The final 7 Hi Actis femoral stem was   chosen and it was impacted down to the level of neck cut.  Based on this   and the trial reductions, a final 36+5 delta ceramic ball was chosen and   impacted onto a clean and dry trunnion, and the hip was reduced.  The   hip had been irrigated throughout the case again at this point.  I did   reapproximate the superior capsular leaflet to the anterior leaflet   using #1 Vicryl.  The fascia of the   tensor fascia lata muscle was then reapproximated using #1 Vicryl and #0 Stratafix sutures.  The   remaining wound was closed with 2-0 Vicryl and running 4-0  Monocryl.   The hip was cleaned, dried, and dressed sterilely using Dermabond and   Aquacel dressing.  The patient was then brought  to recovery room in stable condition tolerating the procedure well.    Danae Orleans, PA-C was present for the entirety of the case involved from   preoperative positioning, perioperative retractor management, general   facilitation of the case, as well as primary wound closure as assistant.            Pietro Cassis Alvan Dame, M.D.        01/19/2019 11:33 AM

## 2019-01-20 ENCOUNTER — Encounter (HOSPITAL_COMMUNITY): Payer: Self-pay | Admitting: Orthopedic Surgery

## 2019-01-20 ENCOUNTER — Other Ambulatory Visit: Payer: Self-pay

## 2019-01-20 DIAGNOSIS — E663 Overweight: Secondary | ICD-10-CM | POA: Diagnosis present

## 2019-01-20 LAB — CBC
HEMATOCRIT: 31 % — AB (ref 39.0–52.0)
Hemoglobin: 10.2 g/dL — ABNORMAL LOW (ref 13.0–17.0)
MCH: 28.1 pg (ref 26.0–34.0)
MCHC: 32.9 g/dL (ref 30.0–36.0)
MCV: 85.4 fL (ref 80.0–100.0)
Platelets: 221 10*3/uL (ref 150–400)
RBC: 3.63 MIL/uL — ABNORMAL LOW (ref 4.22–5.81)
RDW: 13.2 % (ref 11.5–15.5)
WBC: 13.2 10*3/uL — ABNORMAL HIGH (ref 4.0–10.5)
nRBC: 0 % (ref 0.0–0.2)

## 2019-01-20 LAB — BASIC METABOLIC PANEL
Anion gap: 10 (ref 5–15)
BUN: 16 mg/dL (ref 6–20)
CHLORIDE: 97 mmol/L — AB (ref 98–111)
CO2: 27 mmol/L (ref 22–32)
CREATININE: 1.01 mg/dL (ref 0.61–1.24)
Calcium: 8.4 mg/dL — ABNORMAL LOW (ref 8.9–10.3)
GFR calc Af Amer: >60 mL/min (ref >60)
GFR calc non Af Amer: >60 mL/min (ref >60)
Glucose, Bld: 140 mg/dL — ABNORMAL HIGH (ref 70–99)
Potassium: 3.5 mmol/L (ref 3.5–5.1)
Sodium: 134 mmol/L — ABNORMAL LOW (ref 135–145)

## 2019-01-20 MED ORDER — SODIUM CHLORIDE 0.9 % IV BOLUS
500.0000 mL | Freq: Once | INTRAVENOUS | Status: AC | PRN
  Administered 2019-01-20: 500 mL via INTRAVENOUS

## 2019-01-20 NOTE — Progress Notes (Signed)
Pts BP this am was 98/70 & then on recheck was 88/67. P 109-113. T 98.1 O2 98% RA. Pt denies any chest pain or palpitations or shortness of breath. Pt flushed in face, but says that is because he was lying on his stomach. Rapid response RN called. Rapid response nurse ordered 229ml NS bolus while they were on their way to the floor.

## 2019-01-20 NOTE — Progress Notes (Signed)
PT Cancellation Note  Patient Details Name: Douglas Wiggins MRN: [id number] DOB: 10-18-1966   Cancelled Treatment:    Reason Eval/Treat Not Completed: Fatigue/lethargy limiting ability to participate;Patient declined,  Reports being nauseated. Reports that after PT this AM he was dizzy, his HR was up and BP low. Continue PT in AM./   Claretha Cooper 01/20/2019, 4:50 PM  Tresa Endo PT Acute Rehabilitation Services Pager [phone] Office [phone]

## 2019-01-20 NOTE — Progress Notes (Signed)
OT Cancellation Note  Patient Details Name: Douglas Wiggins MRN: [id number] DOB: June 02, 1966   Cancelled Treatment:    Reason Eval/Treat Not Completed: Medical issues which prohibited therapy  Spoke with RN- will check on pt later in the day or next day depending on how pt is doing.  Kari Baars, OT Acute Rehabilitation Services [phone] Office- [phone], Edwena Felty D 01/20/2019, 11:43 AM

## 2019-01-20 NOTE — Progress Notes (Signed)
Spoke with [employer], PA multiple times this morning to inform him of the rapid response, and the patients status since shift change. He told me to continue the patients IV fluids for the remainder of the hospital stay, no blood transfusion will be ordered at this time. Also made him aware of the patients frequent hiccups, no further orders given. Pt has bowel sounds x4 quads, is belching and passing flatus.

## 2019-01-20 NOTE — Progress Notes (Signed)
Physical Therapy Treatment Patient Details Name: Douglas Wiggins MRN: [id number] DOB: [birth year] Today's Date: 01/20/2019    History of Present Illness 53 YO S/P R direct anterior THA. HX TBI, Abominal surgery    PT Comments    Orthostatic BP's in doc flowsheets. RN notified. Patient endorses feeling dizzy during ambulation. Last BP 101/ 69. HR 125. Patient's safety is a concern. Patient asking same questions about use of RW, driving and going out of home. Reiterated safety and need for use of RW at all times and that he is to not leave home except MD appt. . 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 Continue PT, stressing safety issues.   Follow Up Recommendations  Home health PT Highly rec. For safety at home.     Equipment Recommendations    none   Recommendations for Other Services       Precautions / Restrictions Precautions Precautions: Knee Precaution Comments: Left knee with significant varus deformity, has given way Restrictions Weight Bearing Restrictions: No    Mobility  Bed Mobility   Bed Mobility: Supine to Sit;Sit to Supine     Supine to sit: Min guard Sit to supine: Min guard   General bed mobility comments: selg assisted with left leg and belt.  Transfers Overall transfer level: Needs assistance Equipment used: Rolling walker (2 wheeled) Transfers: Sit to/from Stand Sit to Stand: Min assist         General transfer comment: cues for hand and right leg position  Ambulation/Gait Ambulation/Gait assistance: Min assist;+2 safety/equipment Gait Distance (Feet): 80 Feet(x 2) Assistive device: Rolling walker (2 wheeled) Gait Pattern/deviations: Step-through pattern;Decreased step length - right     General Gait Details: L knee with noted varus deformity. Decreased right foot clearance at times. cues to go slow and be certain  bears weight on the RW .   Stairs              Wheelchair Mobility    Modified Rankin (Stroke Patients Only)       Balance                                            Cognition Arousal/Alertness: Awake/alert Behavior During Therapy: WFL for tasks assessed/performed Overall Cognitive Status: History of cognitive impairments - at baseline Area of Impairment: Safety/judgement;Awareness;Memory                         Safety/Judgement: Decreased awareness of safety Awareness: Emergent   General Comments: frequent cues for safety. mReminders that he needs RW. he is not to drive  until MD clears      Exercises Total Joint Exercises Ankle Circles/Pumps: AROM Heel Slides: AAROM;Right;10 reps;Supine Long Arc Quad: AAROM;Right;10 reps;Seated    General Comments        Pertinent Vitals/Pain Faces Pain Scale: Hurts even more Pain Location: right thigh Pain Descriptors / Indicators: Aching;Cramping;Discomfort;Sore;Tightness Pain Intervention(s): Monitored during session;Premedicated before session;Ice applied    Home Living                      Prior Function            PT Goals (current goals can now be found in the care plan section) Progress towards PT goals: Progressing toward goals  Frequency    7X/week      PT Plan Current plan remains appropriate    Co-evaluation              AM-PAC PT "6 Clicks" Mobility   Outcome Measure  Help needed turning from your back to your side while in a flat bed without using bedrails?: A Little Help needed moving from lying on your back to sitting on the side of a flat bed without using bedrails?: A Little Help needed moving to and from a bed to a chair (including a wheelchair)?: A Lot Help needed standing up from a chair using your arms (e.g., wheelchair or bedside chair)?: A Lot Help needed to walk in hospital room?: A Lot Help needed climbing 3-5 steps with a railing? : Total 6 Click Score: 13    End of Session  Equipment Utilized During Treatment: Gait belt Activity Tolerance: Patient tolerated treatment well Patient left: in bed;with call bell/phone within reach;with bed alarm set Nurse Communication: Mobility status PT Visit Diagnosis: Unsteadiness on feet (R26.81) Pain - Right/Left: Right Pain - part of body: Hip     Time: 0623-7628 PT Time Calculation (min) (ACUTE ONLY): 34 min  Charges:  $Gait Training: 8-22 mins $Therapeutic Exercise: 8-22 mins                     [place] Pager [phone] Office [phone]    Claretha Cooper 01/20/2019, 10:43 AM

## 2019-01-20 NOTE — Progress Notes (Signed)
     Subjective: 1 Day Post-Op Procedure(s) (LRB): TOTAL HIP ARTHROPLASTY ANTERIOR APPROACH (Right)   Patient reports pain as mild, pain controlled.  No events throughout the night.  This morning she had hypotension and increase HR.  Rapid response was called, but he is doing well right now.  He states that event hough he had the incident, he really didn't have any symptoms otherwise.  He ants to be discharged home and we have discussed that if he is stable and not ore incidents that he can, if any incidents then we will keep him to make sure he is good.   Objective:   VITALS:   Vitals:   01/20/19 0705 01/20/19 0745  BP: 90/70 (!) 110/96  Pulse: (!) 115 (!) 116  Resp: 16   Temp:    SpO2: 95% 96%    Dorsiflexion/Plantar flexion intact Incision: dressing C/D/I No cellulitis present Compartment soft  LABS Recent Labs    01/20/19 0504  HGB 10.2*  HCT 31.0*  WBC 13.2*  PLT 221    Recent Labs    01/20/19 0504  NA 134*  K 3.5  BUN 16  CREATININE 1.01  GLUCOSE 140*     Assessment/Plan: 1 Day Post-Op Procedure(s) (LRB): TOTAL HIP ARTHROPLASTY ANTERIOR APPROACH (Right) Foley cath d/c'ed Advance diet Up with therapy D/C IV fluids Discharge home Follow up in 2 weeks at [facility] ([place]). Follow up with OLIN,Randee Huston D in 2 weeks.  Contact information:  [facility]) [address], [name] [phone]    Overweight (BMI 25-29.9) Estimated body mass index is 29.94 kg/m as calculated from the following:   Height as of this encounter: 6\' 4"  (1.93 m).   Weight as of this encounter: 111.6 kg. Patient also counseled that weight may inhibit the healing process Patient counseled that losing weight will help with future health issues         [place] Pugh. Kaarin Pardy   PAC  01/20/2019, 8:53 AM

## 2019-01-20 NOTE — Care Management Note (Signed)
Case Management Note  Patient Details  Name: MARTICE DOTY MRN: [id number] Date of Birth: Jun 14, 1966  Subjective/Objective:                  Discharge planning  Action/Plan: dme has dme hhc- [facility] per the patient Expected Discharge Date:  01/20/19               Expected Discharge Plan:     In-House Referral:     Discharge planning Services  CM Consult  Post Acute Care Choice:    Choice offered to:     DME Arranged:    DME Agency:     HH Arranged:    [place] Agency:     Status of Service:  Completed, signed off  If discussed at [employer] of Stay Meetings, dates discussed:    Additional Comments:  Leeroy Cha, RN 01/20/2019, 9:33 AM

## 2019-01-20 NOTE — Progress Notes (Signed)
   Vital Signs MEWS/VS Documentation      01/20/2019 1326 01/20/2019 1514 01/20/2019 1837 01/20/2019 2254   MEWS Score:  1  0  1  2   MEWS Score Color:  Green  Green  Green  Yellow   Resp:  18  -  18  18   Pulse:  91  90  92  (!) 107   BP:  100/70  102/64  107/66  105/61   Temp:  99.4 F (37.4 C)  -  (!) 100.9 F (38.3 C)  (!) 100.9 F (38.3 C)   O2 Device:  Room Air  Room Air  Room Air  -       Pt with elevated temp, tylenol; 650mg  PO given and IS encouraged, RRT [place] notified of pt's current situation.     Reed Breech 01/20/2019,11:06 PM

## 2019-01-20 NOTE — Significant Event (Signed)
Rapid Response Event Note  Overview: Time Called: 2300 Arrival Time: 2310 Event Type: MEWS  Initial Focused Assessment: RN initiated rapid response call related to the patient's MEWS score. I am familiar with [name], having been called to assess him this morning. Similar symptoms as this mornings assessment. [name] is tachycardic at 110-120 bpm. BP maintaining with SBP >100 with continuation of fluids and patient received a total of 1L IVF bolus today. Patient has been receiving Tylenol for pain and is still sustaining a low grade fever this afternoon and into tonight, now at 100.60F. Patient mentation is unchanged from my assessment this morning. He is alert and oriented. Patient resting when I came into the room and does not appear distressed. I did not remove his bandage, but the exposed skin appears healthy in color. I reviewed his WBC and noticed an increase from pre-op of 6.1 to 13.2 this morning.   Interventions: With his persistent tachycardia, low grade fever, and WBC being greater than 12,000. I placed an order for lactic acid to be trended x3. RN notified attending on call.   Plan of Care (if not transferred): RN continue to check vital per MEWS protocol. Notify for change in mentation, decline in blood pressure, increase in heart rate, or persistent fever.  RN to follow up with attending if lactic acid is critical.  Event Summary:  Douglas Wiggins

## 2019-01-20 NOTE — Significant Event (Signed)
Rapid Response Event Note  Overview: Time Called: 1282 Arrival Time: 0700 Event Type: Cardiac  Initial Focused Assessment: Patient lying in bed, awake. Alert and oriented. Current HR sustaining at 115 bpm. BP 88/67. No signs of bleeding from incision site. Patient states his only cardiac history is heart murmur.  Interventions: 500 cc bolus initiated, patient's BP showed positive response to fluid and had a SBP of 106 prior to bolus completion.   Plan of Care (if not transferred): Patient had an EBL 1000 cc during total hip replacement performed on 01/19/19. Patient's Hgb on 2/14 was 17.5 and today at 0500 was 10.2. Patient has been afebrile and denies having chills, lactic acid not ordered at this time.   Primary RN to follow-up with attending MD this morning in regards to need for IVF rehydration vs transfuse PRBC to correct heart rate and improve blood pressure. Patient is having good response to 500 cc IVF bolus.  Patient also complaining of hiccups, states he has had them since post-op recovery. RN to follow-up with MD on PRN for hiccups.   RN to consult rapid response for further needs.  Event Summary:  Douglas Wiggins

## 2019-01-20 NOTE — Progress Notes (Signed)
At 1000, Santiago Glad with PT informed me how patient did with therapy and of his low BP when he stood (see VS flowsheet). I obtained a BP and HR manually and texted [facility] PA with the results. He told me to give another bolus per the PRN order, and to obtain an EKG. This was done and placed in the chart. Patient is currently lying in bed in no acute distress. Will continue to monitor closely

## 2019-01-20 NOTE — Progress Notes (Signed)
   01/20/19 0732  Rapid Response Notification  Name of Rapid Response RN Notified Christian RN  Date Rapid Response Notified 01/20/19  Time Rapid Response Notified [id number]  Provider Notification  Notification Reason Change in status    Vital Signs MEWS/VS Documentation       01/20/2019 0057 01/20/2019 0113 01/20/2019 0520 01/20/2019 0650   MEWS Score:  2  1  2  3    MEWS Score Color:  Yellow  Green  Yellow  Yellow   Resp:  16  16  16  17    Pulse:  (!) 115  (!) 106  (!) 109  (!) 113   BP:  (!) 137/100  --  98/70  (!) 88/67   Temp:  98.1 F (36.7 C)  --  98.1 F (36.7 C)  --   O2 Device:  Room [employer]     Per night shift nurse, RR called and was in progress when I arrived to the floor. Pt was lying in bed in no apparent distress, with night shift RR nurse and day shift RR nurse present in room. See VS flowsheet.         Cecilia Nishikawa R Rebekka Lobello 01/20/2019,7:33 AM

## 2019-01-21 DIAGNOSIS — D62 Acute posthemorrhagic anemia: Secondary | ICD-10-CM | POA: Diagnosis not present

## 2019-01-21 DIAGNOSIS — K219 Gastro-esophageal reflux disease without esophagitis: Secondary | ICD-10-CM | POA: Diagnosis present

## 2019-01-21 DIAGNOSIS — I10 Essential (primary) hypertension: Secondary | ICD-10-CM | POA: Diagnosis present

## 2019-01-21 DIAGNOSIS — G8929 Other chronic pain: Secondary | ICD-10-CM | POA: Diagnosis present

## 2019-01-21 DIAGNOSIS — E7800 Pure hypercholesterolemia, unspecified: Secondary | ICD-10-CM | POA: Diagnosis present

## 2019-01-21 DIAGNOSIS — Z96641 Presence of right artificial hip joint: Secondary | ICD-10-CM

## 2019-01-21 DIAGNOSIS — Z79891 Long term (current) use of opiate analgesic: Secondary | ICD-10-CM | POA: Diagnosis not present

## 2019-01-21 DIAGNOSIS — I959 Hypotension, unspecified: Secondary | ICD-10-CM | POA: Diagnosis not present

## 2019-01-21 DIAGNOSIS — M545 Low back pain: Secondary | ICD-10-CM | POA: Diagnosis present

## 2019-01-21 DIAGNOSIS — E039 Hypothyroidism, unspecified: Secondary | ICD-10-CM | POA: Diagnosis present

## 2019-01-21 DIAGNOSIS — G4733 Obstructive sleep apnea (adult) (pediatric): Secondary | ICD-10-CM | POA: Diagnosis present

## 2019-01-21 DIAGNOSIS — Z79899 Other long term (current) drug therapy: Secondary | ICD-10-CM | POA: Diagnosis not present

## 2019-01-21 DIAGNOSIS — F909 Attention-deficit hyperactivity disorder, unspecified type: Secondary | ICD-10-CM | POA: Diagnosis present

## 2019-01-21 DIAGNOSIS — M1611 Unilateral primary osteoarthritis, right hip: Secondary | ICD-10-CM | POA: Diagnosis present

## 2019-01-21 DIAGNOSIS — M5431 Sciatica, right side: Secondary | ICD-10-CM | POA: Diagnosis present

## 2019-01-21 DIAGNOSIS — F17210 Nicotine dependence, cigarettes, uncomplicated: Secondary | ICD-10-CM | POA: Diagnosis present

## 2019-01-21 DIAGNOSIS — Z6829 Body mass index (BMI) 29.0-29.9, adult: Secondary | ICD-10-CM | POA: Diagnosis not present

## 2019-01-21 DIAGNOSIS — M1990 Unspecified osteoarthritis, unspecified site: Secondary | ICD-10-CM | POA: Diagnosis present

## 2019-01-21 DIAGNOSIS — G43909 Migraine, unspecified, not intractable, without status migrainosus: Secondary | ICD-10-CM | POA: Diagnosis present

## 2019-01-21 DIAGNOSIS — E785 Hyperlipidemia, unspecified: Secondary | ICD-10-CM | POA: Diagnosis present

## 2019-01-21 DIAGNOSIS — E663 Overweight: Secondary | ICD-10-CM | POA: Diagnosis present

## 2019-01-21 DIAGNOSIS — M25551 Pain in right hip: Secondary | ICD-10-CM | POA: Diagnosis present

## 2019-01-21 DIAGNOSIS — F329 Major depressive disorder, single episode, unspecified: Secondary | ICD-10-CM | POA: Diagnosis present

## 2019-01-21 DIAGNOSIS — Z79890 Hormone replacement therapy (postmenopausal): Secondary | ICD-10-CM | POA: Diagnosis not present

## 2019-01-21 DIAGNOSIS — Z882 Allergy status to sulfonamides status: Secondary | ICD-10-CM | POA: Diagnosis not present

## 2019-01-21 LAB — BASIC METABOLIC PANEL
Anion gap: 6 (ref 5–15)
BUN: 13 mg/dL (ref 6–20)
CO2: 27 mmol/L (ref 22–32)
Calcium: 8.1 mg/dL — ABNORMAL LOW (ref 8.9–10.3)
Chloride: 101 mmol/L (ref 98–111)
Creatinine, Ser: 0.95 mg/dL (ref 0.61–1.24)
GFR calc Af Amer: >60 mL/min (ref >60)
GFR calc non Af Amer: >60 mL/min (ref >60)
GLUCOSE: 120 mg/dL — AB (ref 70–99)
Potassium: 4 mmol/L (ref 3.5–5.1)
Sodium: 134 mmol/L — ABNORMAL LOW (ref 135–145)

## 2019-01-21 LAB — CBC
HCT: 24.6 % — ABNORMAL LOW (ref 39.0–52.0)
Hemoglobin: 8 g/dL — ABNORMAL LOW (ref 13.0–17.0)
MCH: 28.6 pg (ref 26.0–34.0)
MCHC: 32.5 g/dL (ref 30.0–36.0)
MCV: 87.9 fL (ref 80.0–100.0)
Platelets: 139 10*3/uL — ABNORMAL LOW (ref 150–400)
RBC: 2.8 MIL/uL — ABNORMAL LOW (ref 4.22–5.81)
RDW: 13.4 % (ref 11.5–15.5)
WBC: 9 10*3/uL (ref 4.0–10.5)
nRBC: 0 % (ref 0.0–0.2)

## 2019-01-21 LAB — LACTIC ACID, PLASMA
Lactic Acid, Venous: 1.3 mmol/L (ref 0.5–1.9)
Lactic Acid, Venous: 1.4 mmol/L (ref 0.5–1.9)

## 2019-01-21 MED ORDER — TRANEXAMIC ACID 650 MG PO TABS (ORTHO)
1950.0000 mg | ORAL_TABLET | Freq: Once | ORAL | Status: AC
  Administered 2019-01-21: 1950 mg via ORAL
  Filled 2019-01-21: qty 3

## 2019-01-21 NOTE — Progress Notes (Signed)
Physical Therapy Treatment Patient Details Name: Douglas Wiggins MRN: [id number] DOB: 02-09-1966 Today's Date: 01/21/2019    History of Present Illness 53 YO S/P R direct anterior THA. HX TBI, Abominal surgery    PT Comments    POD # 2 am session Pt feeling better.  OOB in recliner via OT.  Assisted with amb a greater distance and practiced one step he has to enter home.  Vitals: supine BP 127/72, HR 98, RA 100%            amb BP 124/73, HR 113, RA 100% Pt has met goals to D/C to home after one session   Follow Up Recommendations  Home health PT     Equipment Recommendations  None recommended by PT    Recommendations for Other Services       Precautions / Restrictions Precautions Precautions: Fall Precaution Comments: Left knee with significant varus deformity, has given way Restrictions Weight Bearing Restrictions: No    Mobility  Bed Mobility Overal bed mobility: Modified Independent Bed Mobility: Supine to Sit           General bed mobility comments: OOB in recliner   Transfers Overall transfer level: Needs assistance Equipment used: Rolling walker (2 wheeled) Transfers: Sit to/from Stand Sit to Stand: Supervision         General transfer comment: vc for safe hand placement  Ambulation/Gait Ambulation/Gait assistance: Supervision;Min guard Gait Distance (Feet): 145 Feet Assistive device: Rolling walker (2 wheeled) Gait Pattern/deviations: Step-through pattern;Decreased step length - right Gait velocity: decreased    General Gait Details: L knee with noted varus deformity. Decreased right foot clearance at times. cues to go slow and be certain  bears weight on the RW .  Tolerated an increased distance.     Stairs    one step forward with walker 25% VC's on proper tech performed twice          Wheelchair Mobility    Modified Rankin (Stroke Patients Only)       Balance Overall balance assessment: Mild deficits observed, not formally  tested                                          Cognition Arousal/Alertness: Awake/alert Behavior During Therapy: WFL for tasks assessed/performed Overall Cognitive Status: Within Functional Limits for tasks assessed                                 General Comments: following all commands      Exercises      General Comments        Pertinent Vitals/Pain Pain Assessment: 0-10 Pain Score: 3  Pain Location: right thigh Pain Descriptors / Indicators: Sore;Tightness;Discomfort Pain Intervention(s): Monitored during session;Repositioned;Ice applied    Home Living Family/patient expects to be discharged to:: Private residence Living Arrangements: Parent(mother) Available Help at Discharge: Family;Available 24 hours/day Type of Home: House Home Access: Stairs to enter   Home Layout: One level Home Equipment: Environmental consultant - 2 wheels      Prior Function Level of Independence: Independent          PT Goals (current goals can now be found in the care plan section) Acute Rehab PT Goals Patient Stated Goal: get home before snow Progress towards PT goals: Progressing toward goals    Frequency  7X/week      PT Plan      Co-evaluation              AM-PAC PT "6 Clicks" Mobility   Outcome Measure  Help needed turning from your back to your side while in a flat bed without using bedrails?: A Little Help needed moving from lying on your back to sitting on the side of a flat bed without using bedrails?: A Little Help needed moving to and from a bed to a chair (including a wheelchair)?: A Little Help needed standing up from a chair using your arms (e.g., wheelchair or bedside chair)?: A Little Help needed to walk in hospital room?: A Little Help needed climbing 3-5 steps with a railing? : A Little 6 Click Score: 18    End of Session Equipment Utilized During Treatment: Gait belt Activity Tolerance: Patient tolerated treatment  well Patient left: in bed;with call bell/phone within reach;with bed alarm set Nurse Communication: (pt ready for D/C to home ) PT Visit Diagnosis: Unsteadiness on feet (R26.81) Pain - Right/Left: Right Pain - part of body: Hip     Time: 6922-3009 PT Time Calculation (min) (ACUTE ONLY): 28 min  Charges:  $Gait Training: 8-22 mins $Therapeutic Activity: 8-22 mins                     Rica Koyanagi  PTA Acute  Rehabilitation Services Pager      [phone] Office      [phone]

## 2019-01-21 NOTE — Progress Notes (Addendum)
Patient ID: Douglas Wiggins, male   DOB: 09-Jan-1966, 53 y.o.   MRN: [id number] Subjective: 2 Days Post-Op Procedure(s) (LRB): TOTAL HIP ARTHROPLASTY ANTERIOR APPROACH (Right)    Patient reports pain as mild to moderate.  Events from yesterday noted.  He states he feels better today  Objective:   VITALS:   Vitals:   01/20/19 2254 01/21/19 0253  BP: 105/61 119/63  Pulse: (!) 107 95  Resp: 18 18  Temp: (!) 100.9 F (38.3 C) 99.3 F (37.4 C)  SpO2: 97% 98%    Neurovascular intact Incision: dressing C/D/I  Swelling right thigh Moving RLE well in bed this am  LABS Recent Labs    01/20/19 0504 01/21/19 0247  HGB 10.2* 8.0*  HCT 31.0* 24.6*  WBC 13.2* 9.0  PLT 221 139*    Recent Labs    01/20/19 0504 01/21/19 0247  NA 134* 134*  K 3.5 4.0  BUN 16 13  CREATININE 1.01 0.95  GLUCOSE 140* 120*    No results for input(s): LABPT, INR in the last 72 hours.   Assessment/Plan: 2 Days Post-Op Procedure(s) (LRB): TOTAL HIP ARTHROPLASTY ANTERIOR APPROACH (Right)   Advance diet Up with therapy  Intention is to go home if tolerates therapy sessions this am RTC in 2 weeks  ABLA - treating with IRON, IV fluids, TXA

## 2019-01-21 NOTE — Evaluation (Addendum)
Occupational Therapy Evaluation Patient Details Name: Douglas Wiggins MRN: [id number] DOB: 1966/10/24 Today's Date: 01/21/2019    History of Present Illness 53 YO S/P R direct anterior THA. HX TBI, Abominal surgery   Clinical Impression   PTA pt independent in ADL and mobility. Pt is currently at min guard for transfers, no nausea, no dizziness. Educated to take time with positional changes. Pt able to don underwear without assist, toilet transfer and sink level grooming. Pt requested short hallway ambulation. Pt has walk in shower and seat. Pt will benefit from 3 in 1, and follow up as ordered by MD. 24 hour assist will be provided by mother. Education complete. OT to sign off acutely.     Follow Up Recommendations  Follow surgeon's recommendation for DC plan and follow-up therapies    Equipment Recommendations  3 in 1 bedside commode    Recommendations for Other Services       Precautions / Restrictions Precautions Precautions: Knee Precaution Comments: Left knee with significant varus deformity, has given way Restrictions Weight Bearing Restrictions: No      Mobility Bed Mobility Overal bed mobility: Modified Independent Bed Mobility: Supine to Sit           General bed mobility comments: able to come EOB with use of rails  Transfers Overall transfer level: Needs assistance Equipment used: Rolling walker (2 wheeled) Transfers: Sit to/from Stand Sit to Stand: Min guard         General transfer comment: vc for safe hand placement    Balance Overall balance assessment: Mild deficits observed, not formally tested                                         ADL either performed or assessed with clinical judgement   ADL Overall ADL's : Needs assistance/impaired Eating/Feeding: Independent;Sitting   Grooming: Min guard;Wash/dry hands;Wash/dry face;Standing Grooming Details (indicate cue type and reason): sink level Upper Body Bathing: Set  up;Sitting   Lower Body Bathing: Min guard;Sitting/lateral leans   Upper Body Dressing : Set up;Sitting   Lower Body Dressing: Set up;Sitting/lateral leans   Toilet Transfer: Min guard;Ambulation;RW Toilet Transfer Details (indicate cue type and reason): BSC over toilet for height Toileting- Clothing Manipulation and Hygiene: Min guard;Sit to/from stand   Tub/ Shower Transfer: Walk-in shower;Min guard;Ambulation;Rolling walker;Grab bars   Functional mobility during ADLs: Min guard;Rolling walker;Cueing for sequencing       Vision Patient Visual Report: No change from baseline       Perception     Praxis      Pertinent Vitals/Pain Pain Assessment: 0-10 Pain Score: 3  Pain Location: right thigh Pain Descriptors / Indicators: Sore;Tightness;Discomfort Pain Intervention(s): Limited activity within patient's tolerance;Monitored during session;Repositioned;RN gave pain meds during session     Hand Dominance Right   Extremity/Trunk Assessment Upper Extremity Assessment Upper Extremity Assessment: Overall WFL for tasks assessed   Lower Extremity Assessment Lower Extremity Assessment: Defer to PT evaluation   Cervical / Trunk Assessment Cervical / Trunk Assessment: Normal   Communication Communication Communication: No difficulties   Cognition Arousal/Alertness: Awake/alert Behavior During Therapy: WFL for tasks assessed/performed Overall Cognitive Status: History of cognitive impairments - at baseline  General Comments       Exercises     Shoulder Instructions      Home Living Family/patient expects to be discharged to:: Private residence Living Arrangements: Parent(mother) Available Help at Discharge: Family;Available 24 hours/day Type of Home: House Home Access: Stairs to enter [employer] of Steps: 1   Home Layout: One level     Bathroom Shower/Tub: Walk-in shower;Door   [employer] Toilet:  Standard     Home Equipment: Environmental consultant - 2 wheels          Prior Functioning/Environment Level of Independence: Independent                 OT Problem List: Decreased activity tolerance;Impaired balance (sitting and/or standing);Decreased knowledge of use of DME or AE;Pain;Increased edema      OT Treatment/Interventions:      OT Goals(Current goals can be found in the care plan section) Acute Rehab OT Goals Patient Stated Goal: get home before snow OT Goal Formulation: With patient Time For Goal Achievement: 01/30/19 Potential to Achieve Goals: Good  OT Frequency:     Barriers to D/C:            Co-evaluation              AM-PAC OT "6 Clicks" Daily Activity     Outcome Measure Help from another person eating meals?: None Help from another person taking care of personal grooming?: A Little Help from another person toileting, which includes using toliet, bedpan, or urinal?: A Little Help from another person bathing (including washing, rinsing, drying)?: A Little Help from another person to put on and taking off regular upper body clothing?: None Help from another person to put on and taking off regular lower body clothing?: A Little 6 Click Score: 20   End of Session Equipment Utilized During Treatment: Gait belt;Rolling walker Nurse Communication: Mobility status  Activity Tolerance: Patient tolerated treatment well Patient left: in chair;with call bell/phone within reach;with chair alarm set  OT Visit Diagnosis: Unsteadiness on feet (R26.81);Other abnormalities of gait and mobility (R26.89);Pain Pain - Right/Left: Right Pain - part of body: Hip                Time: 7510-2585 OT Time Calculation (min): 26 min Charges:  OT General Charges $OT Visit: 1 Visit OT Evaluation $OT Eval Moderate Complexity: 1 Mod OT Treatments $Self Care/Home Management : 8-22 mins  Hulda Humphrey OTR/L Acute Rehabilitation Services Pager: [phone] Office:  [place] 01/21/2019, 10:58 AM

## 2019-01-21 NOTE — Progress Notes (Signed)
Dr. Lyla Glassing returned call for notification of MEWS 2, back history given from RRT am of 2/19 and pt temp history this afternoon2/19 and evening2/19. That tylenol 650mg  was given and IS encouraged. That RRT RN wanted to order a Lactic acid level.

## 2019-01-25 NOTE — Discharge Summary (Signed)
Physician Discharge Summary  Patient ID: Douglas Wiggins MRN: [id number] DOB/AGE: 1966-08-15 53 y.o.  Admit date: 01/19/2019 Discharge date: 01/21/2019   Procedures:  Procedure(s) (LRB): TOTAL HIP ARTHROPLASTY ANTERIOR APPROACH (Right)  Attending Physician:  Dr. [name]   Admission Diagnoses:   Right hip primary OA / pain  Discharge Diagnoses:  Principal Problem:   S/P right THA, AA Active Problems:   Overweight (BMI 25.0-29.9)   S/P hip replacement, right  Past Medical History:  Diagnosis Date  . ADHD   . Chronic lower back pain   . Depression   . Diverticulosis of colon   . GERD (gastroesophageal reflux disease)   . Gout    per pt last flare-up 2018  . Hiatal hernia   . History of closed head injury 1992   traumatic---  per pt in coma few days,  residual memory issues  . History of diverticulitis of colon    s/p  colon resection for sigmoid perforation 01-06-2012  . History of sepsis 05/2017  . Hyperlipidemia   . Hypertension   . Hypothyroidism   . Lichen planus   . Memory difficulty    seconday traumatic head injury 1992  . Migraines   . Numbness and tingling of right arm    due to  cervical nerve damage  . OA (osteoarthritis)    right hip,  back, knees, ankles, neck  . OSA (obstructive sleep apnea)    does not wear cpap/states MILD- no sleep study in years  . Sciatica, right side   . Wears glasses   . Wears partial dentures    upper     HPI:    Douglas Wiggins, 53 y.o. male, has a history of pain and functional disability in the right hip(s) due to arthritis and patient has failed non-surgical conservative treatments for greater than 12 weeks to include NSAID's and/or analgesics, corticosteriod injections and activity modification.  Onset of symptoms was gradual starting 3+ years ago with gradually worsening course since that time.The patient noted no past surgery on the right hip(s).  Patient currently rates pain in the right hip at 9 out of 10 with  activity. Patient has night pain, worsening of pain with activity and weight bearing, trendelenberg gait, pain that interfers with activities of daily living and pain with passive range of motion. Patient has evidence of periarticular osteophytes and joint space narrowing by imaging studies. This condition presents safety issues increasing the risk of falls. There is no current active infection.  Risks, benefits and expectations were discussed with the patient.  Risks including but not limited to the risk of anesthesia, blood clots, nerve damage, blood vessel damage, failure of the prosthesis, infection and up to and including death.  Patient understand the   PCP: Octavio Graves, DO   Discharged Condition: good  Hospital Course:  Patient underwent the above stated procedure on 01/19/2019. Patient tolerated the procedure well and brought to the recovery room in good condition and subsequently to the floor.  POD #1 BP: 110/96 ; Pulse: 116 ;  Resp: 16 Patient reports pain as mild, pain controlled.  No events throughout the night.  This morning she had hypotension and increase HR.  Rapid response was called, but he is doing well right now.  He states that event hough he had the incident, he really didn't have any symptoms otherwise.  He ants to be discharged home and we have discussed that if he is stable and not ore incidents that  he can, if any incidents then we will keep him to make sure he is good. Dorsiflexion/plantar flexion intact, incision: dressing C/D/I, no cellulitis present and compartment soft.   LABS  Basename    HGB     10.2  HCT     31.0   POD #2  BP: 119/63 ; Pulse: 95 ; Temp: 99.3 F (37.4 C) ; Resp: 18 Patient reports pain as mild to moderate.  Events from yesterday noted.  He states he feels better today. Neurovascular intact and incision: dressing C/D/I. Swelling right thigh.  Moving RLE well in bed this am.  LABS  Basename    HGB     8.0  HCT     24.6    Discharge  Exam: General appearance: alert, cooperative and no distress Extremities: Homans sign is negative, no sign of DVT, no edema, redness or tenderness in the calves or thighs and no ulcers, gangrene or trophic changes  Disposition:  Home with follow up in 2 weeks   Follow-up Information    [name], MD. Schedule an appointment as soon as possible for a visit in 2 weeks.   Specialty:  Orthopedic Surgery Contact information: [address] [place] [address] [phone]           Discharge Instructions    Call MD / Call 911   Complete by:  As directed    If you experience chest pain or shortness of breath, CALL 911 and be transported to the hospital emergency room.  If you develope a fever above 101 F, pus (white drainage) or increased drainage or redness at the wound, or calf pain, call your surgeon's office.   Change dressing   Complete by:  As directed    Maintain surgical dressing until follow up in the clinic. If the edges start to pull up, may reinforce with tape. If the dressing is no longer working, may remove and cover with gauze and tape, but must keep the area dry and clean.  Call with any questions or concerns.   Constipation Prevention   Complete by:  As directed    Drink plenty of fluids.  Prune juice may be helpful.  You may use a stool softener, such as Colace (over the counter) 100 mg twice a day.  Use MiraLax (over the counter) for constipation as needed.   Diet - low sodium heart healthy   Complete by:  As directed    Discharge instructions   Complete by:  As directed    Maintain surgical dressing until follow up in the clinic. If the edges start to pull up, may reinforce with tape. If the dressing is no longer working, may remove and cover with gauze and tape, but must keep the area dry and clean.  Follow up in 2 weeks at [facility]. Call with any questions or concerns.   Increase activity slowly as tolerated   Complete by:  As  directed    Weight bearing as tolerated with assist device (walker, cane, etc) as directed, use it as long as suggested by your surgeon or therapist, typically at least 4-6 weeks.   TED hose   Complete by:  As directed    Use stockings (TED hose) for 2 weeks on both leg(s).  You may remove them at night for sleeping.      Allergies as of 01/21/2019      Reactions   Sulfa Antibiotics Hives, Rash      Medication List  STOP taking these medications   cephALEXin 500 MG capsule Commonly known as:  KEFLEX   gabapentin 600 MG tablet Commonly known as:  NEURONTIN   ibuprofen 600 MG tablet Commonly known as:  ADVIL,MOTRIN   meloxicam 15 MG tablet Commonly known as:  MOBIC   nabumetone 500 MG tablet Commonly known as:  RELAFEN     TAKE these medications   amphetamine-dextroamphetamine 20 MG tablet Commonly known as:  ADDERALL Take 20 mg by mouth daily. Takes 0900   aspirin 81 MG chewable tablet Commonly known as:  ASPIRIN CHILDRENS Chew 1 tablet (81 mg total) by mouth 2 (two) times daily for 30 days. Take for 4 weeks, then resume regular dose.   carbamazepine 200 MG tablet Commonly known as:  TEGRETOL Take 100 mg by mouth 3 (three) times daily.   cyclobenzaprine 10 MG tablet Commonly known as:  FLEXERIL Take 1 tablet (10 mg total) by mouth 3 (three) times daily as needed for muscle spasms. What changed:    when to take this  reasons to take this   docusate sodium 100 MG capsule Commonly known as:  COLACE Take 1 capsule (100 mg total) by mouth 2 (two) times daily.   DULoxetine 60 MG capsule Commonly known as:  CYMBALTA Take 60 mg by mouth daily.   ferrous sulfate 325 (65 FE) MG tablet Commonly known as:  FERROUSUL Take 1 tablet (325 mg total) by mouth 3 (three) times daily with meals.   HYDROcodone-acetaminophen 7.5-325 MG tablet Commonly known as:  NORCO Take 1-2 tablets by mouth every 4 (four) hours as needed for moderate pain.   levothyroxine 112 MCG  tablet Commonly known as:  SYNTHROID, LEVOTHROID Take 112 mcg by mouth daily before breakfast.   losartan-hydrochlorothiazide 100-12.5 MG tablet Commonly known as:  HYZAAR Take 1 tablet by mouth daily.   pantoprazole 40 MG tablet Commonly known as:  PROTONIX Take 1 tablet (40 mg total) by mouth at bedtime. What changed:  when to take this   polyethylene glycol packet Commonly known as:  MIRALAX / GLYCOLAX Take 17 g by mouth 2 (two) times daily.   polyvinyl alcohol 1.4 % ophthalmic solution Commonly known as:  LIQUIFILM TEARS Place 1 drop into both eyes as needed for dry eyes.            Discharge Care Instructions  (From admission, onward)         Start     Ordered   01/20/19 0000  Change dressing    Comments:  Maintain surgical dressing until follow up in the clinic. If the edges start to pull up, may reinforce with tape. If the dressing is no longer working, may remove and cover with gauze and tape, but must keep the area dry and clean.  Call with any questions or concerns.   01/20/19 0858           Signed: West Pugh. Oda Lansdowne   PA-C  01/25/2019, 1:26 PM

## 2019-03-15 ENCOUNTER — Encounter (HOSPITAL_COMMUNITY): Payer: Medicaid Other

## 2019-03-23 ENCOUNTER — Inpatient Hospital Stay (HOSPITAL_COMMUNITY): Admission: RE | Admit: 2019-03-23 | Payer: Medicaid Other | Source: Home / Self Care | Admitting: Orthopedic Surgery

## 2019-03-23 ENCOUNTER — Encounter (HOSPITAL_COMMUNITY): Admission: RE | Payer: Self-pay | Source: Home / Self Care

## 2019-03-23 SURGERY — ARTHROPLASTY, KNEE, TOTAL
Anesthesia: Spinal | Laterality: Left

## 2019-05-12 NOTE — H&P (Signed)
TOTAL KNEE ADMISSION H&P  Patient is being admitted for left total knee arthroplasty.  Subjective:  Chief Complaint:    Left knee primary OA / pain  HPI: Douglas Wiggins, 53 y.o. male, has a history of pain and functional disability in the left knee due to arthritis and has failed non-surgical conservative treatments for greater than 12 weeks to include NSAID's and/or analgesics, corticosteriod injections, use of assistive devices and activity modification.  Onset of symptoms was gradual, starting  years ago with gradually worsening course since that time. The patient noted no past surgery on the left knee(s).  Patient currently rates pain in the left knee(s) at 9 out of 10 with activity. Patient has night pain, worsening of pain with activity and weight bearing, pain that interferes with activities of daily living, pain with passive range of motion, crepitus and joint swelling.  Patient has evidence of periarticular osteophytes and joint space narrowing by imaging studies. There is no active infection.  Risks, benefits and expectations were discussed with the patient.  Risks including but not limited to the risk of anesthesia, blood clots, nerve damage, blood vessel damage, failure of the prosthesis, infection and up to and including death.  Patient understand the risks, benefits and expectations and wishes to proceed with surgery.   PCP: Octavio Graves, DO  D/C Plans:       Home   Post-op Meds:       No Rx given   Tranexamic Acid:      To be given - IV   Decadron:      Is to be given  FYI:      ASA  [place]  DME:   Pt already has equipment   PT:   [place]: [place]   Patient Active Problem List   Diagnosis Date Noted  . S/P hip replacement, right 01/21/2019  . Overweight (BMI 25.0-29.9) 01/20/2019  . S/P right THA, AA 01/19/2019  . Substance abuse ([place]) 05/24/2017  . Elevated troponin 05/24/2017  . Diarrhea 05/24/2017  . Sepsis ([place]) 05/23/2017  . Ventral hernia  11/10/2013  . Recurrent ventral incisional hernia 08/26/2013  . Disruption of wound, unspecified 03/01/2013  . Respiratory failure, post-operative ([place]) 10/28/2012  . Acute renal insufficiency, nonoliguric 10/28/2012  . Status post laparotomy, evac of hemoperitoneum and wound vac placement 11/26 10/28/2012  .  Ex lap with partial colectomy with revision of colon anastomosis, repair of ventral hernia 11/25 10/28/2012  . Encephalopathy acute 10/27/2012  . Abdominal compartment syndrome, post lap 11/26 10/27/2012  . Shock due to abdominal compartment syndrome and acute blood loss 10/27/2012  . Hemoperitoneum, resolved post lap 11/26 10/27/2012  . H/O Hypertension 01/06/2012  . Hypothyroid 01/06/2012   Past Medical History:  Diagnosis Date  . ADHD   . Chronic lower back pain   . Depression   . Diverticulosis of colon   . GERD (gastroesophageal reflux disease)   . Gout    per pt last flare-up 2018  . Hiatal hernia   . History of closed head injury 1992   traumatic---  per pt in coma few days,  residual memory issues  . History of diverticulitis of colon    s/p  colon resection for sigmoid perforation 01-06-2012  . History of sepsis 05/2017  . Hyperlipidemia   . Hypertension   . Hypothyroidism   . Lichen planus   . Memory difficulty    seconday traumatic head injury 1992  . Migraines   .  Numbness and tingling of right arm    due to  cervical nerve damage  . OA (osteoarthritis)    right hip,  back, knees, ankles, neck  . OSA (obstructive sleep apnea)    does not wear cpap/states MILD- no sleep study in years  . Sciatica, right side   . Wears glasses   . Wears partial dentures    upper     Past Surgical History:  Procedure Laterality Date  . ANKLE ARTHROSCOPY Right 05-20-2003   dr graves @[facility]   . APPLICATION OF WOUND VAC  10/29/2012   Procedure: APPLICATION OF WOUND VAC;  Surgeon: Imogene Burn. Georgette Dover, MD;  Location: [facility];  Service: General;;  . CARPAL TUNNEL RELEASE Left  1990's  . COLON RESECTION  10/26/2012   Procedure: COLON RESECTION;  Surgeon: Imogene Burn. Georgette Dover, MD;  Location: [facility];  Service: General;  Laterality: N/A;  Partial colon resection with anastomosis  . COLOSTOMY CLOSURE  04/13/2012   Procedure: COLOSTOMY CLOSURE;  Surgeon: Imogene Burn. Georgette Dover, MD;  Location: [facility];  Service: General;  Laterality: N/A;  . COLOSTOMY REVISION  01/06/2012   Procedure: COLON RESECTION SIGMOID;  Surgeon: Imogene Burn. Georgette Dover, MD;  Location: [facility];  Service: General;  Laterality: N/A;  with hartmans procedure  . HAND SURGERY Left 08-02-2009   dr Burney Gauze @[facility]    repair left fifth digit crush injury  . INSERTION OF MESH N/A 11/10/2013   Procedure: INSERTION OF MESH;  Surgeon: Imogene Burn. Georgette Dover, MD;  Location: [facility];  Service: General;  Laterality: N/A;  . KNEE ARTHROSCOPY Right 1990's  . LAPAROTOMY  10/26/2012   Procedure: EXPLORATORY LAPAROTOMY;  Surgeon: Imogene Burn. Georgette Dover, MD;  Location: [place] [facility];  Service: General;  Laterality: N/A;  . LAPAROTOMY  10/27/2012   Procedure: EXPLORATORY LAPAROTOMY;  Surgeon: Zenovia Jarred, MD;  Location: [facility];  Service: General;  Laterality: N/A;  . LAPAROTOMY  10/29/2012   Procedure: EXPLORATORY LAPAROTOMY;  Surgeon: Imogene Burn. Georgette Dover, MD;  Location: [facility];  Service: General;  Laterality: N/A;  exploratory laparotomy, partial closure of abdominal wound,    . SHOULDER ARTHROSCOPY Right 10-06-2008  dr supple  @[facility]   . TOTAL HIP ARTHROPLASTY Right 01/19/2019   Procedure: TOTAL HIP ARTHROPLASTY ANTERIOR APPROACH;  Surgeon: [name], MD;  Location: [facility];  Service: Orthopedics;  Laterality: Right;  70 mins  . VENTRAL HERNIA REPAIR  10/26/2012   Procedure: HERNIA REPAIR VENTRAL ADULT;  Surgeon: Imogene Burn. Georgette Dover, MD;  Location: [facility];  Service: General;  Laterality: N/A;  Primary Repair Ventral Hernia  . VENTRAL HERNIA REPAIR N/A 11/10/2013   Procedure: LAPAROSCOPIC VENTRAL HERNIA ;  Surgeon: Imogene Burn. Georgette Dover, MD;  Location: [place];  Service:  General;  Laterality: N/A;    No current facility-administered medications for this encounter.    Current Outpatient Medications  Medication Sig Dispense Refill Last Dose  . amphetamine-dextroamphetamine (ADDERALL) 20 MG tablet Take 20 mg by mouth daily. Takes 0900   Past Week at Unknown time  . carbamazepine (TEGRETOL) 200 MG tablet Take 100 mg by mouth 3 (three) times daily.    01/18/2019 at Unknown time  . cyclobenzaprine (FLEXERIL) 10 MG tablet Take 1 tablet (10 mg total) by mouth 3 (three) times daily as needed for muscle spasms. 15 tablet 0   . docusate sodium (COLACE) 100 MG capsule Take 1 capsule (100 mg total) by mouth 2 (two) times daily. 10 capsule 0   . DULoxetine (CYMBALTA) 60 MG capsule Take 60 mg  by mouth daily.   01/19/2019 at 0700  . ferrous sulfate (FERROUSUL) 325 (65 FE) MG tablet Take 1 tablet (325 mg total) by mouth 3 (three) times daily with meals.  3   . HYDROcodone-acetaminophen (NORCO) 7.5-325 MG tablet Take 1-2 tablets by mouth every 4 (four) hours as needed for moderate pain. 60 tablet 0   . levothyroxine (SYNTHROID, LEVOTHROID) 112 MCG tablet Take 112 mcg by mouth daily before breakfast.    01/18/2019 at Unknown time  . losartan-hydrochlorothiazide (HYZAAR) 100-12.5 MG tablet Take 1 tablet by mouth daily.   01/18/2019 at Unknown time  . pantoprazole (PROTONIX) 40 MG tablet Take 1 tablet (40 mg total) by mouth at bedtime. (Patient taking differently: Take 40 mg by mouth daily. ) 14 tablet 0 01/18/2019 at Unknown time  . polyethylene glycol (MIRALAX / GLYCOLAX) packet Take 17 g by mouth 2 (two) times daily. 14 each 0   . polyvinyl alcohol (LIQUIFILM TEARS) 1.4 % ophthalmic solution Place 1 drop into both eyes as needed for dry eyes.   Unknown at Unknown time   Allergies  Allergen Reactions  . Sulfa Antibiotics Hives and Rash    Social History   Tobacco Use  . Smoking status: Current Some Day Smoker    Years: 30.00    Types: Cigarettes  . Smokeless tobacco: Former  Systems developer    Types: Chew    Quit date: 01/15/1989  . Tobacco comment: per pt 1 pp month  Substance Use Topics  . Alcohol use: No       Review of Systems  Constitutional: Negative.   HENT: Negative.   Eyes: Negative.   Respiratory: Negative.   Cardiovascular: Negative.   Gastrointestinal: Negative.   Genitourinary: Negative.   Musculoskeletal: Positive for back pain and joint pain.  Skin: Negative.   Neurological: Negative.   Endo/Heme/Allergies: Negative.   Psychiatric/Behavioral: Negative.     Objective:  Physical Exam  Constitutional: He is oriented to person, place, and time. He appears well-developed.  HENT:  Head: Normocephalic.  Eyes: Pupils are equal, round, and reactive to light.  Neck: Neck supple. No JVD present. No tracheal deviation present. No thyromegaly present.  Cardiovascular: Normal rate, regular rhythm and intact distal pulses.  Murmur heard. Respiratory: Effort normal and breath sounds normal. No respiratory distress. He has no wheezes.  GI: Soft. There is no abdominal tenderness. There is no guarding.  Musculoskeletal:     Left knee: He exhibits decreased range of motion, swelling and bony tenderness. He exhibits no ecchymosis, no deformity, no laceration and no erythema. Tenderness found.  Lymphadenopathy:    He has no cervical adenopathy.  Neurological: He is alert and oriented to person, place, and time.  Skin: Skin is warm and dry.  Psychiatric: He has a normal mood and affect.      Labs:  Estimated body mass index is 29.94 kg/m as calculated from the following:   Height as of 01/19/19: 6\' 4"  (1.93 m).   Weight as of 01/19/19: 111.6 kg.   Imaging Review Plain radiographs demonstrate severe degenerative joint disease of the left knee.  The bone quality appears to be good for age and reported activity level.      Assessment/Plan:  End stage arthritis, left knee   The patient history, physical examination, clinical judgment of the  provider and imaging studies are consistent with end stage degenerative joint disease of the left knee and total knee arthroplasty is deemed medically necessary. The treatment options including medical management, injection  therapy arthroscopy and arthroplasty were discussed at length. The risks and benefits of total knee arthroplasty were presented and reviewed. The risks due to aseptic loosening, infection, stiffness, patella tracking problems, thromboembolic complications and other imponderables were discussed. The patient acknowledged the explanation, agreed to proceed with the plan and consent was signed. Patient is being admitted for inpatient treatment for surgery, pain control, PT, OT, prophylactic antibiotics, VTE prophylaxis, progressive ambulation and ADL's and discharge planning. The patient is planning to be discharged home.     Patient's anticipated LOS is less than 2 midnights, meeting these requirements: - Younger than 29 - Lives within 1 hour of care - Has a competent adult at home to recover with post-op recover - NO history of  - Chronic pain requiring opiods  - Diabetes  - Coronary Artery Disease  - Heart failure  - Heart attack  - Stroke  - DVT/VTE  - Cardiac arrhythmia  - Respiratory Failure/COPD  - Renal failure  - Anemia  - Advanced Liver disease      West Pugh. Luverna Degenhart   PA-C  05/12/2019, 11:29 AM

## 2019-05-20 NOTE — Progress Notes (Signed)
EKG 01-20-18 OK TO USE PER JESSICA ZANETTO PA

## 2019-05-20 NOTE — Progress Notes (Addendum)
EKG 01-20-19 Epic ECHO 05-26-17 EPIC

## 2019-05-20 NOTE — Patient Instructions (Addendum)
Douglas Wiggins    Your procedure is scheduled on: 05-25-2019   Report to [name] Main  Entrance    Report to admitting at [name] [facility] TEST ON 05-21-19 @11 :05 AM, THIS TEST MUST BE DONE BEFORE SURGERY, COME TO [place]. ONCE YOUR COVID TEST IS COMPLETED, PLEASE BEGIN THE QUARANTINE INSTRUCTIONS AS OUTLINED IN YOUR HANDOUT.   Call this number if you have problems the morning of surgery [phone]    Remember:  NO SOLID FOOD AFTER MIDNIGHT THE NIGHT PRIOR TO SURGERY. NOTHING BY MOUTH EXCEPT CLEAR LIQUIDS UNTIL 540 AM. PLEASE FINISH ENSURE DRINK PER SURGEON ORDER 3 HOURS PRIOR TO SCHEDULED SURGERY TIME WHICH NEEDS TO BE COMPLETED AT 540 AM.   CLEAR LIQUID DIET   Foods Allowed                                                                     Foods Excluded  Coffee and tea, regular and decaf                             liquids that you cannot  Plain Jell-O in any flavor                                             see through such as: Fruit ices (not with fruit pulp)                                     milk, soups, orange juice  Iced Popsicles                                    All solid food Carbonated beverages, regular and diet                                    Cranberry, grape and apple juices Sports drinks like Gatorade Lightly seasoned clear broth or consume(fat free) Sugar, honey syrup  Sample Menu Breakfast                                Lunch                                     Supper Cranberry juice                    Beef broth                            Chicken broth Jell-O  Grape juice                           Apple juice Coffee or tea                        Jell-O                                      Popsicle                                                Coffee or tea                        Coffee or  tea  _____________________________________________________________________     Take these medicines the morning of surgery with A SIP OF WATER: TEGRETOL, DULOXETINE (CYMBALTA), LEVOTHYROXINE (SYNTHROID), PANTAPRAZOLE (PROTONIX)       BRUSH YOUR TEETH MORNING OF SURGERY AND RINSE YOUR MOUTH OUT, NO CHEWING GUM CANDY OR MINTS.                           You may not have any metal on your body including hair pins and              piercings    Do not wear jewelry, cologne, lotions, powders or perfumes, deodorant                        Men may shave face and neck.   Do not bring valuables to the hospital. [name].  Contacts, dentures or bridgework may not be worn into surgery.      : _____________________________________________________________________             [name] - Preparing for Surgery Before surgery, you can play an important role.  Because skin is not sterile, your skin needs to be as free of germs as possible.  You can reduce the number of germs on your skin by washing with CHG (chlorahexidine gluconate) soap before surgery.  CHG is an antiseptic cleaner which kills germs and bonds with the skin to continue killing germs even after washing. Please DO NOT use if you have an allergy to CHG or antibacterial soaps.  If your skin becomes reddened/irritated stop using the CHG and inform your nurse when you arrive at Short Stay. Do not shave (including legs and underarms) for at least 48 hours prior to the first CHG shower.  You may shave your face/neck. Please follow these instructions carefully:  1.  Shower with CHG Soap the night before surgery and the  morning of Surgery.  2.  If you choose to wash your hair, wash your hair first as usual with your  normal  shampoo.  3.  After you shampoo, rinse your hair and body thoroughly to remove the  shampoo.                           4.  Use CHG as you would any  other liquid soap.   You can apply chg directly  to the skin and wash                       Gently with a scrungie or clean washcloth.  5.  Apply the CHG Soap to your body ONLY FROM THE NECK DOWN.   Do not use on face/ open                           Wound or open sores. Avoid contact with eyes, ears mouth and genitals (private parts).                       Wash face,  Genitals (private parts) with your normal soap.             6.  Wash thoroughly, paying special attention to the area where your surgery  will be performed.  7.  Thoroughly rinse your body with warm water from the neck down.  8.  DO NOT shower/wash with your normal soap after using and rinsing off  the CHG Soap.                9.  Pat yourself dry with a clean towel.            10.  Wear clean pajamas.            11.  Place clean sheets on your bed the night of your first shower and do not  sleep with pets. Day of Surgery : Do not apply any lotions/deodorants the morning of surgery.  Please wear clean clothes to the hospital/surgery center.  FAILURE TO FOLLOW THESE INSTRUCTIONS MAY RESULT IN THE CANCELLATION OF YOUR SURGERY PATIENT SIGNATURE_________________________________  NURSE SIGNATURE__________________________________  ________________________________________________________________________   Douglas Wiggins  An incentive spirometer is a tool that can help keep your lungs clear and active. This tool measures how well you are filling your lungs with each breath. Taking long deep breaths may help reverse or decrease the chance of developing breathing (pulmonary) problems (especially infection) following:  A long period of time when you are unable to move or be active. BEFORE THE PROCEDURE   If the spirometer includes an indicator to show your best effort, your nurse or respiratory therapist will set it to a desired goal.  If possible, sit up straight or lean slightly forward. Try not to slouch.  Hold the incentive spirometer in an  upright position. INSTRUCTIONS FOR USE  1. Sit on the edge of your bed if possible, or sit up as far as you can in bed or on a chair. 2. Hold the incentive spirometer in an upright position. 3. Breathe out normally. 4. Place the mouthpiece in your mouth and seal your lips tightly around it. 5. Breathe in slowly and as deeply as possible, raising the piston or the ball toward the top of the column. 6. Hold your breath for 3-5 seconds or for as long as possible. Allow the piston or ball to fall to the bottom of the column. 7. Remove the mouthpiece from your mouth and breathe out normally. 8. Rest for a few seconds and repeat Steps 1 through 7 at least 10 times every 1-2 hours when you are awake. Take your time and take a few normal breaths between deep breaths. 9. The spirometer may include an indicator to show your best effort. Use the indicator as  a goal to work toward during each repetition. 10. After each set of 10 deep breaths, practice coughing to be sure your lungs are clear. If you have an incision (the cut made at the time of surgery), support your incision when coughing by placing a pillow or rolled up towels firmly against it. Once you are able to get out of bed, walk around indoors and cough well. You may stop using the incentive spirometer when instructed by your caregiver.  RISKS AND COMPLICATIONS  Take your time so you do not get dizzy or light-headed.  If you are in pain, you may need to take or ask for pain medication before doing incentive spirometry. It is harder to take a deep breath if you are having pain. AFTER USE  Rest and breathe slowly and easily.  It can be helpful to keep track of a log of your progress. Your caregiver can provide you with a simple table to help with this. If you are using the spirometer at home, follow these instructions: [place] IF:   You are having difficultly using the spirometer.  You have trouble using the spirometer as often as  instructed.  Your pain medication is not giving enough relief while using the spirometer.  You develop fever of 100.5 F (38.1 C) or higher. SEEK IMMEDIATE MEDICAL CARE IF:   You cough up bloody sputum that had not been present before.  You develop fever of 102 F (38.9 C) or greater.  You develop worsening pain at or near the incision site. MAKE SURE YOU:   Understand these instructions.  Will watch your condition.  Will get help right away if you are not doing well or get worse. Document Released: 03/31/2007 Document Revised: 02/10/2012 Document Reviewed: 06/01/2007 ExitCare Patient Information 2014 ExitCare, [place].   ________________________________________________________________________  WHAT IS A BLOOD TRANSFUSION? Blood Transfusion Information  A transfusion is the replacement of blood or some of its parts. Blood is made up of multiple cells which provide different functions.  Red blood cells carry oxygen and are used for blood loss replacement.  White blood cells fight against infection.  Platelets control bleeding.  Plasma helps clot blood.  Other blood products are available for specialized needs, such as hemophilia or other clotting disorders. BEFORE THE TRANSFUSION  Who gives blood for transfusions?   Healthy volunteers who are fully evaluated to make sure their blood is safe. This is blood bank blood. Transfusion therapy is the safest it has ever been in the practice of medicine. Before blood is taken from a donor, a complete history is taken to make sure that person has no history of diseases nor engages in risky social behavior (examples are intravenous drug use or sexual activity with multiple partners). The donor's travel history is screened to minimize risk of transmitting infections, such as malaria. The donated blood is tested for signs of infectious diseases, such as HIV and hepatitis. The blood is then tested to be sure it is compatible with you in  order to minimize the chance of a transfusion reaction. If you or a relative donates blood, this is often done in anticipation of surgery and is not appropriate for emergency situations. It takes many days to process the donated blood. RISKS AND COMPLICATIONS Although transfusion therapy is very safe and saves many lives, the main dangers of transfusion include:   Getting an infectious disease.  Developing a transfusion reaction. This is an allergic reaction to something in the blood you were given.  Every precaution is taken to prevent this. The decision to have a blood transfusion has been considered carefully by your caregiver before blood is given. Blood is not given unless the benefits outweigh the risks. AFTER THE TRANSFUSION  Right after receiving a blood transfusion, you will usually feel much better and more energetic. This is especially true if your red blood cells have gotten low (anemic). The transfusion raises the level of the red blood cells which carry oxygen, and this usually causes an energy increase.  The nurse administering the transfusion will monitor you carefully for complications. HOME CARE INSTRUCTIONS  No special instructions are needed after a transfusion. You may find your energy is better. Speak with your caregiver about any limitations on activity for underlying diseases you may have. SEEK MEDICAL CARE IF:   Your condition is not improving after your transfusion.  You develop redness or irritation at the intravenous (IV) site. SEEK IMMEDIATE MEDICAL CARE IF:  Any of the following symptoms occur over the next 12 hours:  Shaking chills.  You have a temperature by mouth above 102 F (38.9 C), not controlled by medicine.  Chest, back, or muscle pain.  People around you feel you are not acting correctly or are confused.  Shortness of breath or difficulty breathing.  Dizziness and fainting.  You get a rash or develop hives.  You have a decrease in urine  output.  Your urine turns a dark color or changes to pink, red, or brown. Any of the following symptoms occur over the next 10 days:  You have a temperature by mouth above 102 F (38.9 C), not controlled by medicine.  Shortness of breath.  Weakness after normal activity.  The white part of the eye turns yellow (jaundice).  You have a decrease in the amount of urine or are urinating less often.  Your urine turns a dark color or changes to pink, red, or brown. Document Released: 11/15/2000 Document Revised: 02/10/2012 Document Reviewed: 07/04/2008 [facility] Patient Information 2014 [place], [place].  _______________________________________________________________________

## 2019-05-21 ENCOUNTER — Encounter (HOSPITAL_COMMUNITY): Payer: Self-pay

## 2019-05-21 ENCOUNTER — Other Ambulatory Visit (HOSPITAL_COMMUNITY)
Admission: RE | Admit: 2019-05-21 | Discharge: 2019-05-21 | Disposition: A | Discharge status: Home / Self Care | Payer: Medicaid Other | Source: Ambulatory Visit | Attending: Orthopedic Surgery | Admitting: Orthopedic Surgery

## 2019-05-21 ENCOUNTER — Encounter (HOSPITAL_COMMUNITY)
Admission: RE | Admit: 2019-05-21 | Discharge: 2019-05-21 | Disposition: A | Discharge status: Home / Self Care | Payer: Medicaid Other | Source: Ambulatory Visit | Attending: Orthopedic Surgery | Admitting: Orthopedic Surgery

## 2019-05-21 ENCOUNTER — Other Ambulatory Visit: Payer: Self-pay

## 2019-05-21 DIAGNOSIS — M1712 Unilateral primary osteoarthritis, left knee: Secondary | ICD-10-CM | POA: Insufficient documentation

## 2019-05-21 DIAGNOSIS — Z01812 Encounter for preprocedural laboratory examination: Secondary | ICD-10-CM | POA: Insufficient documentation

## 2019-05-21 DIAGNOSIS — Z1159 Encounter for screening for other viral diseases: Secondary | ICD-10-CM | POA: Diagnosis not present

## 2019-05-21 HISTORY — DX: Other complications of anesthesia, initial encounter: T88.59XA

## 2019-05-21 LAB — SURGICAL PCR SCREEN
MRSA, PCR: POSITIVE — AB
Staphylococcus aureus: POSITIVE — AB

## 2019-05-21 LAB — BASIC METABOLIC PANEL
Anion gap: 11 (ref 5–15)
BUN: 20 mg/dL (ref 6–20)
CO2: 26 mmol/L (ref 22–32)
Calcium: 9.8 mg/dL (ref 8.9–10.3)
Chloride: 100 mmol/L (ref 98–111)
Creatinine, Ser: 0.79 mg/dL (ref 0.61–1.24)
GFR calc Af Amer: >60 mL/min (ref >60)
GFR calc non Af Amer: >60 mL/min (ref >60)
Glucose, Bld: 73 mg/dL (ref 70–99)
Potassium: 4 mmol/L (ref 3.5–5.1)
Sodium: 137 mmol/L (ref 135–145)

## 2019-05-21 LAB — CBC
HCT: 49.4 % (ref 39.0–52.0)
Hemoglobin: 15.1 g/dL (ref 13.0–17.0)
MCH: 23.3 pg — ABNORMAL LOW (ref 26.0–34.0)
MCHC: 30.6 g/dL (ref 30.0–36.0)
MCV: 76.1 fL — ABNORMAL LOW (ref 80.0–100.0)
Platelets: 232 10*3/uL (ref 150–400)
RBC: 6.49 MIL/uL — ABNORMAL HIGH (ref 4.22–5.81)
RDW: 16.9 % — ABNORMAL HIGH (ref 11.5–15.5)
WBC: 5.8 10*3/uL (ref 4.0–10.5)
nRBC: 0 % (ref 0.0–0.2)

## 2019-05-21 LAB — SARS CORONAVIRUS 2 (TAT 6-24 HRS): SARS Coronavirus 2: NEGATIVE

## 2019-05-21 NOTE — Progress Notes (Signed)
PCR result routed to Dr. Alvan Dame for review

## 2019-05-24 MED ORDER — VANCOMYCIN HCL 10 G IV SOLR
1500.0000 mg | INTRAVENOUS | Status: AC
  Filled 2019-05-24: qty 1500

## 2019-05-25 ENCOUNTER — Inpatient Hospital Stay (HOSPITAL_COMMUNITY): Payer: Medicaid Other | Admitting: Physician Assistant

## 2019-05-25 ENCOUNTER — Encounter (HOSPITAL_COMMUNITY): Payer: Self-pay | Admitting: Anesthesiology

## 2019-05-25 ENCOUNTER — Other Ambulatory Visit: Payer: Self-pay

## 2019-05-25 ENCOUNTER — Observation Stay (HOSPITAL_COMMUNITY)
Admission: RE | Admit: 2019-05-25 | Discharge: 2019-05-26 | Disposition: A | Discharge status: Home Health | Payer: Medicaid Other | Attending: Orthopedic Surgery | Admitting: Orthopedic Surgery

## 2019-05-25 ENCOUNTER — Inpatient Hospital Stay (HOSPITAL_COMMUNITY): Payer: Medicaid Other | Admitting: Anesthesiology

## 2019-05-25 ENCOUNTER — Encounter (HOSPITAL_COMMUNITY)
Admission: RE | Disposition: A | Discharge status: Home Health | Payer: Self-pay | Source: Home / Self Care | Attending: Orthopedic Surgery

## 2019-05-25 DIAGNOSIS — E039 Hypothyroidism, unspecified: Secondary | ICD-10-CM | POA: Insufficient documentation

## 2019-05-25 DIAGNOSIS — G4733 Obstructive sleep apnea (adult) (pediatric): Secondary | ICD-10-CM | POA: Insufficient documentation

## 2019-05-25 DIAGNOSIS — F17210 Nicotine dependence, cigarettes, uncomplicated: Secondary | ICD-10-CM | POA: Diagnosis not present

## 2019-05-25 DIAGNOSIS — K219 Gastro-esophageal reflux disease without esophagitis: Secondary | ICD-10-CM | POA: Insufficient documentation

## 2019-05-25 DIAGNOSIS — Z79890 Hormone replacement therapy (postmenopausal): Secondary | ICD-10-CM | POA: Diagnosis not present

## 2019-05-25 DIAGNOSIS — F329 Major depressive disorder, single episode, unspecified: Secondary | ICD-10-CM | POA: Diagnosis not present

## 2019-05-25 DIAGNOSIS — F909 Attention-deficit hyperactivity disorder, unspecified type: Secondary | ICD-10-CM | POA: Insufficient documentation

## 2019-05-25 DIAGNOSIS — Z79899 Other long term (current) drug therapy: Secondary | ICD-10-CM | POA: Insufficient documentation

## 2019-05-25 DIAGNOSIS — I10 Essential (primary) hypertension: Secondary | ICD-10-CM | POA: Diagnosis not present

## 2019-05-25 DIAGNOSIS — Z96652 Presence of left artificial knee joint: Secondary | ICD-10-CM

## 2019-05-25 DIAGNOSIS — M1712 Unilateral primary osteoarthritis, left knee: Secondary | ICD-10-CM | POA: Diagnosis present

## 2019-05-25 DIAGNOSIS — Z96641 Presence of right artificial hip joint: Secondary | ICD-10-CM | POA: Diagnosis not present

## 2019-05-25 DIAGNOSIS — E663 Overweight: Secondary | ICD-10-CM | POA: Diagnosis present

## 2019-05-25 DIAGNOSIS — Z882 Allergy status to sulfonamides status: Secondary | ICD-10-CM | POA: Diagnosis not present

## 2019-05-25 DIAGNOSIS — Z6829 Body mass index (BMI) 29.0-29.9, adult: Secondary | ICD-10-CM | POA: Diagnosis not present

## 2019-05-25 HISTORY — PX: TOTAL KNEE ARTHROPLASTY: SHX125

## 2019-05-25 LAB — TYPE AND SCREEN
ABO/RH(D): O POS
Antibody Screen: NEGATIVE

## 2019-05-25 SURGERY — ARTHROPLASTY, KNEE, TOTAL
Anesthesia: Monitor Anesthesia Care | Laterality: Left

## 2019-05-25 MED ORDER — OXYCODONE HCL 5 MG PO TABS: 5.0000 mg | ORAL_TABLET | Freq: Once | ORAL | Status: DC | PRN

## 2019-05-25 MED ORDER — TRANEXAMIC ACID-NACL 1000-0.7 MG/100ML-% IV SOLN
1000.0000 mg | INTRAVENOUS | Status: AC
  Administered 2019-05-25: 10:00:00 1000 mg via INTRAVENOUS
  Filled 2019-05-25: qty 100

## 2019-05-25 MED ORDER — CHLORHEXIDINE GLUCONATE 4 % EX LIQD: 60.0000 mL | Freq: Once | CUTANEOUS | Status: DC

## 2019-05-25 MED ORDER — PHENYLEPHRINE 40 MCG/ML (10ML) SYRINGE FOR IV PUSH (FOR BLOOD PRESSURE SUPPORT)
PREFILLED_SYRINGE | INTRAVENOUS | Status: AC
  Filled 2019-05-25: qty 10

## 2019-05-25 MED ORDER — PROPOFOL 10 MG/ML IV BOLUS
INTRAVENOUS | Status: AC
  Filled 2019-05-25: qty 60

## 2019-05-25 MED ORDER — DULOXETINE HCL 60 MG PO CPEP
60.0000 mg | ORAL_CAPSULE | Freq: Every day | ORAL | Status: DC
  Administered 2019-05-26: 60 mg via ORAL
  Filled 2019-05-25: qty 1

## 2019-05-25 MED ORDER — MENTHOL 3 MG MT LOZG: 1.0000 | LOZENGE | OROMUCOSAL | Status: DC | PRN

## 2019-05-25 MED ORDER — BUPIVACAINE-EPINEPHRINE (PF) 0.25% -1:200000 IJ SOLN
INTRAMUSCULAR | Status: DC | PRN
  Administered 2019-05-25: 30 mL

## 2019-05-25 MED ORDER — ONDANSETRON HCL 4 MG/2ML IJ SOLN
INTRAMUSCULAR | Status: AC
  Filled 2019-05-25: qty 6

## 2019-05-25 MED ORDER — HYDROCODONE-ACETAMINOPHEN 5-325 MG PO TABS
1.0000 | ORAL_TABLET | ORAL | Status: DC | PRN
  Administered 2019-05-25 (×2): 1 via ORAL
  Filled 2019-05-25 (×2): qty 1

## 2019-05-25 MED ORDER — POLYETHYLENE GLYCOL 3350 17 G PO PACK
17.0000 g | PACK | Freq: Two times a day (BID) | ORAL | Status: DC
  Administered 2019-05-25: 17 g via ORAL
  Filled 2019-05-25: qty 1

## 2019-05-25 MED ORDER — FENTANYL CITRATE (PF) 100 MCG/2ML IJ SOLN
INTRAMUSCULAR | Status: DC | PRN
  Administered 2019-05-25: 25 ug via INTRAVENOUS
  Administered 2019-05-25 (×2): 50 ug via INTRAVENOUS
  Administered 2019-05-25: 25 ug via INTRAVENOUS
  Administered 2019-05-25: 50 ug via INTRAVENOUS

## 2019-05-25 MED ORDER — AMPHETAMINE-DEXTROAMPHETAMINE 20 MG PO TABS: 20.0000 mg | ORAL_TABLET | Freq: Every day | ORAL | Status: DC

## 2019-05-25 MED ORDER — HYDROCODONE-ACETAMINOPHEN 7.5-325 MG PO TABS
1.0000 | ORAL_TABLET | ORAL | Status: DC | PRN
  Administered 2019-05-25 – 2019-05-26 (×4): 2 via ORAL
  Filled 2019-05-25 (×3): qty 2
  Filled 2019-05-25: qty 1
  Filled 2019-05-25: qty 2

## 2019-05-25 MED ORDER — ONDANSETRON HCL 4 MG/2ML IJ SOLN: 4.0000 mg | Freq: Four times a day (QID) | INTRAMUSCULAR | Status: DC | PRN

## 2019-05-25 MED ORDER — PROPOFOL 10 MG/ML IV BOLUS
INTRAVENOUS | Status: AC
  Filled 2019-05-25: qty 80

## 2019-05-25 MED ORDER — PHENYLEPHRINE 40 MCG/ML (10ML) SYRINGE FOR IV PUSH (FOR BLOOD PRESSURE SUPPORT)
PREFILLED_SYRINGE | INTRAVENOUS | Status: DC | PRN
  Administered 2019-05-25 (×4): 80 ug via INTRAVENOUS

## 2019-05-25 MED ORDER — CARBAMAZEPINE 200 MG PO TABS
100.0000 mg | ORAL_TABLET | Freq: Three times a day (TID) | ORAL | Status: DC
  Administered 2019-05-25 – 2019-05-26 (×3): 100 mg via ORAL
  Filled 2019-05-25 (×4): qty 0.5

## 2019-05-25 MED ORDER — KETOROLAC TROMETHAMINE 30 MG/ML IJ SOLN
INTRAMUSCULAR | Status: DC | PRN
  Administered 2019-05-25: 30 mg

## 2019-05-25 MED ORDER — PHENOL 1.4 % MT LIQD
1.0000 | OROMUCOSAL | Status: DC | PRN
  Filled 2019-05-25: qty 177

## 2019-05-25 MED ORDER — ASPIRIN 81 MG PO CHEW
81.0000 mg | CHEWABLE_TABLET | Freq: Two times a day (BID) | ORAL | Status: DC
  Administered 2019-05-25 – 2019-05-26 (×2): 81 mg via ORAL
  Filled 2019-05-25 (×2): qty 1

## 2019-05-25 MED ORDER — METOCLOPRAMIDE HCL 5 MG PO TABS: 5.0000 mg | ORAL_TABLET | Freq: Three times a day (TID) | ORAL | Status: DC | PRN

## 2019-05-25 MED ORDER — MIDAZOLAM HCL 2 MG/2ML IJ SOLN
INTRAMUSCULAR | Status: AC
  Filled 2019-05-25: qty 2

## 2019-05-25 MED ORDER — METOCLOPRAMIDE HCL 5 MG/ML IJ SOLN: 5.0000 mg | Freq: Three times a day (TID) | INTRAMUSCULAR | Status: DC | PRN

## 2019-05-25 MED ORDER — FENTANYL CITRATE (PF) 100 MCG/2ML IJ SOLN
50.0000 ug | INTRAMUSCULAR | Status: DC
  Filled 2019-05-25: qty 2

## 2019-05-25 MED ORDER — PROMETHAZINE HCL 25 MG/ML IJ SOLN: 6.2500 mg | INTRAMUSCULAR | Status: DC | PRN

## 2019-05-25 MED ORDER — DEXAMETHASONE SODIUM PHOSPHATE 10 MG/ML IJ SOLN: 10.0000 mg | Freq: Once | INTRAMUSCULAR | Status: DC

## 2019-05-25 MED ORDER — VANCOMYCIN HCL IN DEXTROSE 1-5 GM/200ML-% IV SOLN: 1000.0000 mg | Freq: Once | INTRAVENOUS | Status: DC

## 2019-05-25 MED ORDER — DIPHENHYDRAMINE HCL 12.5 MG/5ML PO ELIX: 12.5000 mg | ORAL_SOLUTION | ORAL | Status: DC | PRN

## 2019-05-25 MED ORDER — DEXAMETHASONE SODIUM PHOSPHATE 10 MG/ML IJ SOLN
INTRAMUSCULAR | Status: AC
  Filled 2019-05-25: qty 3

## 2019-05-25 MED ORDER — OXYCODONE HCL 5 MG/5ML PO SOLN: 5.0000 mg | Freq: Once | ORAL | Status: DC | PRN

## 2019-05-25 MED ORDER — HYDROMORPHONE HCL 1 MG/ML IJ SOLN: 0.2500 mg | INTRAMUSCULAR | Status: DC | PRN

## 2019-05-25 MED ORDER — DEXAMETHASONE SODIUM PHOSPHATE 10 MG/ML IJ SOLN
10.0000 mg | Freq: Once | INTRAMUSCULAR | Status: AC
  Administered 2019-05-25: 10 mg via INTRAVENOUS

## 2019-05-25 MED ORDER — DOCUSATE SODIUM 100 MG PO CAPS
100.0000 mg | ORAL_CAPSULE | Freq: Two times a day (BID) | ORAL | Status: DC
  Administered 2019-05-25 – 2019-05-26 (×2): 100 mg via ORAL
  Filled 2019-05-25 (×2): qty 1

## 2019-05-25 MED ORDER — LEVOTHYROXINE SODIUM 112 MCG PO TABS
112.0000 ug | ORAL_TABLET | Freq: Every day | ORAL | Status: DC
  Administered 2019-05-26: 112 ug via ORAL
  Filled 2019-05-25: qty 1

## 2019-05-25 MED ORDER — MIDAZOLAM HCL 5 MG/5ML IJ SOLN
INTRAMUSCULAR | Status: DC | PRN
  Administered 2019-05-25 (×2): 1 mg via INTRAVENOUS
  Administered 2019-05-25: 2 mg via INTRAVENOUS

## 2019-05-25 MED ORDER — CEFAZOLIN SODIUM-DEXTROSE 2-4 GM/100ML-% IV SOLN
INTRAVENOUS | Status: AC
  Filled 2019-05-25: qty 100

## 2019-05-25 MED ORDER — LACTATED RINGERS IV SOLN
INTRAVENOUS | Status: DC
  Administered 2019-05-25 (×3): via INTRAVENOUS

## 2019-05-25 MED ORDER — CEFAZOLIN SODIUM-DEXTROSE 2-4 GM/100ML-% IV SOLN
2.0000 g | Freq: Four times a day (QID) | INTRAVENOUS | Status: AC
  Administered 2019-05-25 (×2): 2 g via INTRAVENOUS
  Filled 2019-05-25 (×3): qty 100

## 2019-05-25 MED ORDER — ROPIVACAINE HCL 5 MG/ML IJ SOLN
INTRAMUSCULAR | Status: DC | PRN
  Administered 2019-05-25: 20 mL via PERINEURAL

## 2019-05-25 MED ORDER — HYDROMORPHONE HCL 1 MG/ML IJ SOLN
0.5000 mg | INTRAMUSCULAR | Status: DC | PRN
  Administered 2019-05-25 – 2019-05-26 (×3): 1 mg via INTRAVENOUS
  Filled 2019-05-25 (×3): qty 1

## 2019-05-25 MED ORDER — KETOROLAC TROMETHAMINE 30 MG/ML IJ SOLN
INTRAMUSCULAR | Status: AC
  Filled 2019-05-25: qty 1

## 2019-05-25 MED ORDER — ONDANSETRON HCL 4 MG/2ML IJ SOLN
INTRAMUSCULAR | Status: DC | PRN
  Administered 2019-05-25: 4 mg via INTRAVENOUS

## 2019-05-25 MED ORDER — CEFAZOLIN SODIUM-DEXTROSE 2-3 GM-%(50ML) IV SOLR
INTRAVENOUS | Status: DC | PRN
  Administered 2019-05-25: 2 g via INTRAVENOUS

## 2019-05-25 MED ORDER — SODIUM CHLORIDE (PF) 0.9 % IJ SOLN
INTRAMUSCULAR | Status: DC | PRN
  Administered 2019-05-25: 30 mL

## 2019-05-25 MED ORDER — PROPOFOL 10 MG/ML IV BOLUS
INTRAVENOUS | Status: DC | PRN
  Administered 2019-05-25: 10 mg via INTRAVENOUS
  Administered 2019-05-25 (×3): 20 mg via INTRAVENOUS

## 2019-05-25 MED ORDER — ACETAMINOPHEN 325 MG PO TABS: 325.0000 mg | ORAL_TABLET | Freq: Four times a day (QID) | ORAL | Status: DC | PRN

## 2019-05-25 MED ORDER — FERROUS SULFATE 325 (65 FE) MG PO TABS
325.0000 mg | ORAL_TABLET | Freq: Two times a day (BID) | ORAL | Status: DC
  Administered 2019-05-25 – 2019-05-26 (×2): 325 mg via ORAL
  Filled 2019-05-25 (×2): qty 1

## 2019-05-25 MED ORDER — SODIUM CHLORIDE 0.9 % IR SOLN
Status: DC | PRN
  Administered 2019-05-25: 1000 mL

## 2019-05-25 MED ORDER — BUPIVACAINE HCL (PF) 0.75 % IJ SOLN
INTRAMUSCULAR | Status: DC | PRN
  Administered 2019-05-25: 2 mL via INTRATHECAL

## 2019-05-25 MED ORDER — TRANEXAMIC ACID-NACL 1000-0.7 MG/100ML-% IV SOLN
1000.0000 mg | Freq: Once | INTRAVENOUS | Status: AC
  Administered 2019-05-25: 1000 mg via INTRAVENOUS

## 2019-05-25 MED ORDER — LOSARTAN POTASSIUM-HCTZ 100-12.5 MG PO TABS: 1.0000 | ORAL_TABLET | Freq: Every day | ORAL | Status: DC

## 2019-05-25 MED ORDER — ONDANSETRON HCL 4 MG PO TABS: 4.0000 mg | ORAL_TABLET | Freq: Four times a day (QID) | ORAL | Status: DC | PRN

## 2019-05-25 MED ORDER — PROPOFOL 500 MG/50ML IV EMUL
INTRAVENOUS | Status: DC | PRN
  Administered 2019-05-25: 150 ug/kg/min via INTRAVENOUS

## 2019-05-25 MED ORDER — LOSARTAN POTASSIUM 50 MG PO TABS
100.0000 mg | ORAL_TABLET | Freq: Every day | ORAL | Status: DC
  Administered 2019-05-26: 100 mg via ORAL
  Filled 2019-05-25: qty 2

## 2019-05-25 MED ORDER — PHENYLEPHRINE HCL (PRESSORS) 10 MG/ML IV SOLN
INTRAVENOUS | Status: AC
  Filled 2019-05-25: qty 7

## 2019-05-25 MED ORDER — FENTANYL CITRATE (PF) 100 MCG/2ML IJ SOLN
INTRAMUSCULAR | Status: AC
  Filled 2019-05-25: qty 2

## 2019-05-25 MED ORDER — POVIDONE-IODINE 10 % EX SWAB
2.0000 "application " | Freq: Once | CUTANEOUS | Status: AC
  Administered 2019-05-25: 2 via TOPICAL

## 2019-05-25 MED ORDER — BUPIVACAINE-EPINEPHRINE (PF) 0.25% -1:200000 IJ SOLN
INTRAMUSCULAR | Status: AC
  Filled 2019-05-25: qty 30

## 2019-05-25 MED ORDER — PANTOPRAZOLE SODIUM 40 MG PO TBEC
40.0000 mg | DELAYED_RELEASE_TABLET | Freq: Every day | ORAL | Status: DC
  Administered 2019-05-25: 40 mg via ORAL
  Filled 2019-05-25: qty 1

## 2019-05-25 MED ORDER — BISACODYL 10 MG RE SUPP: 10.0000 mg | Freq: Every day | RECTAL | Status: DC | PRN

## 2019-05-25 MED ORDER — EPHEDRINE SULFATE-NACL 50-0.9 MG/10ML-% IV SOSY
PREFILLED_SYRINGE | INTRAVENOUS | Status: DC | PRN
  Administered 2019-05-25: 5 mg via INTRAVENOUS

## 2019-05-25 MED ORDER — METHOCARBAMOL 500 MG IVPB - SIMPLE MED
500.0000 mg | Freq: Four times a day (QID) | INTRAVENOUS | Status: DC | PRN
  Filled 2019-05-25: qty 50

## 2019-05-25 MED ORDER — METHOCARBAMOL 500 MG PO TABS
500.0000 mg | ORAL_TABLET | Freq: Four times a day (QID) | ORAL | Status: DC | PRN
  Administered 2019-05-25 – 2019-05-26 (×2): 500 mg via ORAL
  Filled 2019-05-25 (×2): qty 1

## 2019-05-25 MED ORDER — HYDROCHLOROTHIAZIDE 12.5 MG PO CAPS
12.5000 mg | ORAL_CAPSULE | Freq: Every day | ORAL | Status: DC
  Administered 2019-05-26: 12.5 mg via ORAL
  Filled 2019-05-25: qty 1

## 2019-05-25 MED ORDER — ALUM & MAG HYDROXIDE-SIMETH 200-200-20 MG/5ML PO SUSP: 15.0000 mL | ORAL | Status: DC | PRN

## 2019-05-25 MED ORDER — 0.9 % SODIUM CHLORIDE (POUR BTL) OPTIME
TOPICAL | Status: DC | PRN
  Administered 2019-05-25: 1000 mL

## 2019-05-25 MED ORDER — SODIUM CHLORIDE (PF) 0.9 % IJ SOLN
INTRAMUSCULAR | Status: AC
  Filled 2019-05-25: qty 50

## 2019-05-25 MED ORDER — MIDAZOLAM HCL 2 MG/2ML IJ SOLN
1.0000 mg | INTRAMUSCULAR | Status: DC
  Filled 2019-05-25: qty 2

## 2019-05-25 MED ORDER — SODIUM CHLORIDE 0.9 % IV SOLN
INTRAVENOUS | Status: DC
  Administered 2019-05-25: 14:00:00 via INTRAVENOUS

## 2019-05-25 MED ORDER — MAGNESIUM CITRATE PO SOLN: 1.0000 | Freq: Once | ORAL | Status: DC | PRN

## 2019-05-25 MED ORDER — PROPOFOL 10 MG/ML IV BOLUS
INTRAVENOUS | Status: AC
  Filled 2019-05-25: qty 40

## 2019-05-25 SURGICAL SUPPLY — 63 items
ATTUNE MED ANAT PAT 38 KNEE (Knees) ×1 IMPLANT
ATTUNE PS FEM LT SZ 7 CEM KNEE (Femur) ×1 IMPLANT
ATTUNE PSRP INSR SZ7 6 KNEE (Insert) ×1 IMPLANT
BAG ZIPLOCK 12X15 (MISCELLANEOUS) IMPLANT
BANDAGE ACE 6X5 VEL STRL LF (GAUZE/BANDAGES/DRESSINGS) ×2 IMPLANT
BASE TIBIAL ROT PLAT SZ 7 KNEE (Knees) IMPLANT
BLADE SAW SGTL 11.0X1.19X90.0M (BLADE) IMPLANT
BLADE SAW SGTL 13.0X1.19X90.0M (BLADE) ×2 IMPLANT
BLADE SURG SZ10 CARB STEEL (BLADE) ×4 IMPLANT
BOWL SMART MIX CTS (DISPOSABLE) ×2 IMPLANT
CEMENT HV SMART SET (Cement) ×1 IMPLANT
COVER SURGICAL LIGHT HANDLE (MISCELLANEOUS) ×2 IMPLANT
COVER WAND RF STERILE (DRAPES) IMPLANT
CUFF TOURN SGL QUICK 34 (TOURNIQUET CUFF) ×1
CUFF TRNQT CYL 34X4.125X (TOURNIQUET CUFF) ×1 IMPLANT
DECANTER SPIKE VIAL GLASS SM (MISCELLANEOUS) ×4 IMPLANT
DERMABOND ADVANCED (GAUZE/BANDAGES/DRESSINGS) ×1
DERMABOND ADVANCED .7 DNX12 (GAUZE/BANDAGES/DRESSINGS) ×1 IMPLANT
DRAPE U-SHAPE 47X51 STRL (DRAPES) ×2 IMPLANT
DRESSING AQUACEL AG SP 3.5X10 (GAUZE/BANDAGES/DRESSINGS) ×1 IMPLANT
DRSG AQUACEL AG ADV 3.5X10 (GAUZE/BANDAGES/DRESSINGS) ×1 IMPLANT
DRSG AQUACEL AG SP 3.5X10 (GAUZE/BANDAGES/DRESSINGS) ×2
DURAPREP 26ML APPLICATOR (WOUND CARE) ×4 IMPLANT
ELECT REM PT RETURN 15FT ADLT (MISCELLANEOUS) ×2 IMPLANT
GLOVE BIO SURGEON STRL SZ 6 (GLOVE) ×2 IMPLANT
GLOVE BIOGEL PI IND STRL 6.5 (GLOVE) ×1 IMPLANT
GLOVE BIOGEL PI IND STRL 7.5 (GLOVE) ×1 IMPLANT
GLOVE BIOGEL PI IND STRL 8.5 (GLOVE) ×1 IMPLANT
GLOVE BIOGEL PI IND STRL 9 (GLOVE) IMPLANT
GLOVE BIOGEL PI INDICATOR 6.5 (GLOVE) ×1
GLOVE BIOGEL PI INDICATOR 7.5 (GLOVE) ×1
GLOVE BIOGEL PI INDICATOR 8.5 (GLOVE) ×1
GLOVE BIOGEL PI INDICATOR 9 (GLOVE) ×1
GLOVE ECLIPSE 8.0 STRL XLNG CF (GLOVE) ×2 IMPLANT
GLOVE ORTHO TXT STRL SZ7.5 (GLOVE) ×2 IMPLANT
GLOVE SURG SS PI 6.5 STRL IVOR (GLOVE) ×1 IMPLANT
GOWN STRL REUS W/ TWL LRG LVL3 (GOWN DISPOSABLE) ×1 IMPLANT
GOWN STRL REUS W/TWL 2XL LVL3 (GOWN DISPOSABLE) ×2 IMPLANT
GOWN STRL REUS W/TWL LRG LVL3 (GOWN DISPOSABLE) ×3 IMPLANT
HANDPIECE INTERPULSE COAX TIP (DISPOSABLE) ×1
HOLDER FOLEY CATH W/STRAP (MISCELLANEOUS) ×1 IMPLANT
KIT TURNOVER KIT A (KITS) ×1 IMPLANT
MANIFOLD NEPTUNE II (INSTRUMENTS) ×2 IMPLANT
NDL SAFETY ECLIPSE 18X1.5 (NEEDLE) IMPLANT
NEEDLE HYPO 18GX1.5 SHARP (NEEDLE)
NS IRRIG 1000ML POUR BTL (IV SOLUTION) ×2 IMPLANT
PACK TOTAL KNEE CUSTOM (KITS) ×2 IMPLANT
PIN FIX SIGMA HP QUICK REL (PIN) ×1 IMPLANT
PROTECTOR NERVE ULNAR (MISCELLANEOUS) ×2 IMPLANT
SET HNDPC FAN SPRY TIP SCT (DISPOSABLE) ×1 IMPLANT
SET PAD KNEE POSITIONER (MISCELLANEOUS) ×2 IMPLANT
SUT MNCRL AB 4-0 PS2 18 (SUTURE) ×2 IMPLANT
SUT STRATAFIX PDS+ 0 24IN (SUTURE) ×2 IMPLANT
SUT VIC AB 1 CT1 36 (SUTURE) ×2 IMPLANT
SUT VIC AB 2-0 CT1 27 (SUTURE) ×3
SUT VIC AB 2-0 CT1 TAPERPNT 27 (SUTURE) ×3 IMPLANT
SYR 3ML LL SCALE MARK (SYRINGE) ×2 IMPLANT
TIBIAL BASE ROT PLAT SZ 7 KNEE (Knees) ×2 IMPLANT
TRAY FOLEY MTR SLVR 16FR STAT (SET/KITS/TRAYS/PACK) ×2 IMPLANT
WATER STERILE IRR 1000ML POUR (IV SOLUTION) ×4 IMPLANT
WRAP KNEE MAXI GEL POST OP (GAUZE/BANDAGES/DRESSINGS) ×2 IMPLANT
YANKAUER SUCT BULB TIP 10FT TU (MISCELLANEOUS) ×2 IMPLANT
YANKAUER SUCT BULB TIP NO VENT (SUCTIONS) ×1 IMPLANT

## 2019-05-25 NOTE — Interval H&P Note (Signed)
History and Physical Interval Note:  05/25/2019 7:02 AM  Douglas Wiggins  has presented today for surgery, with the diagnosis of Left knee osteoarthritis.  The various methods of treatment have been discussed with the patient and family. After consideration of risks, benefits and other options for treatment, the patient has consented to  Procedure(s) with comments: TOTAL KNEE ARTHROPLASTY (Left) - 27mins as a surgical intervention.  The patient's history has been reviewed, patient examined, no change in status, stable for surgery.  I have reviewed the patient's chart and labs.  Questions were answered to the patient's satisfaction.     Mauri Pole

## 2019-05-25 NOTE — Anesthesia Procedure Notes (Signed)
Anesthesia Regional Block: Adductor canal block   Pre-Anesthetic Checklist: ,, timeout performed, Correct Patient, Correct Site, Correct Laterality, Correct Procedure, Correct Position, site marked, Risks and benefits discussed,  Surgical consent,  Pre-op evaluation,  At surgeon's request and post-op pain management  Laterality: Left  Prep: chloraprep       Needles:  Injection technique: Single-shot  Needle Type: Stimiplex     Needle Length: 9cm  Needle Gauge: 21     Additional Needles:   Procedures:,,,, ultrasound used (permanent image in chart),,,,  Narrative:  Start time: 05/25/2019 7:50 AM End time: 05/25/2019 7:55 AM Injection made incrementally with aspirations every 5 mL.  Performed by: Personally  Anesthesiologist: Lynda Rainwater, MD

## 2019-05-25 NOTE — Evaluation (Signed)
Physical Therapy Evaluation Patient Details Name: Douglas Wiggins MRN: [id number] DOB: 06-02-1966 Today's Date: 05/25/2019   History of Present Illness  53 yo male s/p L TKR on 05/25/19. 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  Clinical Impression   Pt presents with moderate L knee pain, decreased L knee ROM, increased time and effort to perform mobility tasks, and decreased activity tolerance. Pt to benefit from acute PT to address deficits. Pt ambulated hallway distance with RW with min guard assist, verbal cuing for form and safety provided throughout. Pt educated on ankle pumps (20/hour) to perform this afternoon/evening to increase circulation, to pt's tolerance and limited by pain. PT to progress mobility as tolerated, and will continue to follow acutely.        Follow Up Recommendations Follow surgeon's recommendation for DC plan and follow-up therapies;Supervision for mobility/OOB(OPPT)    Equipment Recommendations  None recommended by PT    Recommendations for Other Services       Precautions / Restrictions Precautions Precautions: Fall Restrictions Weight Bearing Restrictions: No Other Position/Activity Restrictions: WBAT      Mobility  Bed Mobility Overal bed mobility: Needs Assistance Bed Mobility: Supine to Sit     Supine to sit: Min guard;HOB elevated     General bed mobility comments: Min guard for safety, increased time and effort.  Transfers Overall transfer level: Needs assistance Equipment used: Rolling walker (2 wheeled) Transfers: Sit to/from Stand Sit to Stand: Min guard;From elevated surface         General transfer comment: Min guard for safety, verbal cuing for hand placement when rising.  Ambulation/Gait Ambulation/Gait assistance: Min guard;+2 safety/equipment Gait Distance (Feet): 60 Feet Assistive device: Rolling  walker (2 wheeled) Gait Pattern/deviations: Step-to pattern;Decreased step length - right;Decreased step length - left;Antalgic;Trunk flexed Gait velocity: decr   General Gait Details: Min guard for safety, verbal cuing to slow down as pt moving quickly and unsafely initially, sequencing, placement in RW. Pt with increasing antalgic gait with further ambulation distance.  Stairs            Wheelchair Mobility    Modified Rankin (Stroke Patients Only)       Balance Overall balance assessment: Mild deficits observed, not formally tested                                           Pertinent Vitals/Pain Pain Assessment: 0-10 Pain Score: 5  Pain Location: L knee Pain Descriptors / Indicators: Sore Pain Intervention(s): Monitored during session;Limited activity within patient's tolerance;Premedicated before session;Repositioned;Ice applied    Home Living Family/patient expects to be discharged to:: Private residence Living Arrangements: Parent(mother) Available Help at Discharge: Family;Available 24 hours/day Type of Home: House Home Access: Stairs to enter   [employer] of Steps: 1 Home Layout: One level Home Equipment: Walker - 2 wheels;Crutches      Prior Function Level of Independence: Independent               Hand Dominance   Dominant Hand: Right    Extremity/Trunk Assessment   Upper Extremity Assessment Upper Extremity Assessment: Overall WFL for tasks assessed    Lower Extremity Assessment Lower Extremity Assessment: Overall WFL for tasks assessed;LLE deficits/detail LLE Deficits / Details: suspected post-surgical weakness; able to perform ankle pumps, quad set, heel slide  to 70*, SLR without lift assist or quad lag. LLE Sensation: WNL    Cervical / Trunk Assessment Cervical / Trunk Assessment: Normal  Communication   Communication: No difficulties  Cognition Arousal/Alertness: Awake/alert Behavior During Therapy:  WFL for tasks assessed/performed Overall Cognitive Status: History of cognitive impairments - at baseline                                 General Comments: history of TBI, pt very talkative and at times tactless with statements, but otherwise cognition WFL for session.      General Comments      Exercises     Assessment/Plan    PT Assessment Patient needs continued PT services  PT Problem List Decreased strength;Decreased mobility;Decreased range of motion;Decreased activity tolerance;Decreased knowledge of use of DME;Decreased balance;Pain       PT Treatment Interventions DME instruction;Therapeutic activities;Gait training;Therapeutic exercise;Patient/family education;Balance training;Stair training;Functional mobility training    PT Goals (Current goals can be found in the Care Plan section)  Acute Rehab PT Goals Patient Stated Goal: go home PT Goal Formulation: With patient Time For Goal Achievement: 06/01/19 Potential to Achieve Goals: Good    Frequency 7X/week   Barriers to discharge        Co-evaluation               AM-PAC PT "6 Clicks" Mobility  Outcome Measure Help needed turning from your back to your side while in a flat bed without using bedrails?: A Little Help needed moving from lying on your back to sitting on the side of a flat bed without using bedrails?: A Little Help needed moving to and from a bed to a chair (including a wheelchair)?: A Little Help needed standing up from a chair using your arms (e.g., wheelchair or bedside chair)?: A Little Help needed to walk in hospital room?: A Little Help needed climbing 3-5 steps with a railing? : A Little 6 Click Score: 18    End of Session Equipment Utilized During Treatment: Gait belt Activity Tolerance: Patient tolerated treatment well Patient left: in chair;with call bell/phone within reach;with SCD's reapplied(no posey chair alarm in the room, RN notified) Nurse Communication:  Mobility status PT Visit Diagnosis: Other abnormalities of gait and mobility (R26.89);Difficulty in walking, not elsewhere classified (R26.2)    Time: 1448-1856 PT Time Calculation (min) (ACUTE ONLY): 17 min   Charges:   PT Evaluation $PT Eval Low Complexity: 1 Low         Selene Peltzer Conception Chancy, PT Acute Rehabilitation Services Pager [phone]  Office [phone]  Roxine Caddy D Elonda Husky 05/25/2019, 3:49 PM

## 2019-05-25 NOTE — Anesthesia Preprocedure Evaluation (Signed)
Anesthesia Evaluation  Patient identified by MRN, date of birth, ID band Patient awake    Reviewed: Allergy & Precautions, NPO status , Patient's Chart, lab work & pertinent test results  Airway Mallampati: II  TM Distance: >3 FB Neck ROM: Full    Dental  (+) Dental Advisory Given   Pulmonary sleep apnea , Current Smoker, former smoker,    breath sounds clear to auscultation       Cardiovascular hypertension, Pt. 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    GI/Hepatic Neg liver ROS, hiatal hernia, GERD  ,  Endo/Other  Hypothyroidism   Renal/GU Renal disease     Musculoskeletal  (+) Arthritis ,   Abdominal   Peds  Hematology negative hematology ROS (+)   Anesthesia Other Findings   Reproductive/Obstetrics                             Lab Results  Component Value Date   WBC 5.8 05/21/2019   HGB 15.1 05/21/2019   HCT 49.4 05/21/2019   MCV 76.1 (L) 05/21/2019   PLT 232 05/21/2019   Lab Results  Component Value Date   CREATININE 0.79 05/21/2019   BUN 20 05/21/2019   NA 137 05/21/2019   K 4.0 05/21/2019   CL 100 05/21/2019   CO2 26 05/21/2019    Anesthesia Physical  Anesthesia Plan  ASA: II  Anesthesia Plan: Spinal and MAC   Post-op Pain Management:  Regional for Post-op pain   Induction: Intravenous  PONV Risk Score and Plan: 1 and Ondansetron, Treatment may vary due to age or medical condition and Propofol infusion  Airway Management Planned: Natural Airway and Simple Face Mask  Additional Equipment:   Intra-op Plan:   Post-operative Plan:   Informed Consent: I have reviewed the patients History and Physical, chart, labs and discussed the procedure including the risks, benefits and alternatives for the proposed anesthesia with the patient or authorized representative who has indicated  his/her understanding and acceptance.       Plan Discussed with: CRNA  Anesthesia Plan Comments:         Anesthesia Quick Evaluation

## 2019-05-25 NOTE — Op Note (Signed)
NAME:  Douglas Wiggins RECORD NO.:  [id number]                             FACILITY:  [facility]      PHYSICIAN:  Pietro Cassis. Alvan Dame, M.D.  DATE OF BIRTH:  Dec 31, 1965      DATE OF PROCEDURE:  05/25/2019                                     OPERATIVE REPORT         PREOPERATIVE DIAGNOSIS:  Left knee osteoarthritis.      POSTOPERATIVE DIAGNOSIS:  Left knee osteoarthritis.      FINDINGS:  The patient was noted to have complete loss of cartilage and   bone-on-bone arthritis with associated osteophytes in the medial and patellofemoral compartments of   the knee with a significant synovitis and associated effusion.  The patient had failed months of conservative treatment including medications, injection therapy, activity modification.     PROCEDURE:  Left total knee replacement.      COMPONENTS USED:  DePuy Attune rotating platform posterior stabilized knee   system, a size 7 femur, 7 tibia, size 6 mm PS AOX insert, and 38 anatomic patellar   button.      SURGEON:  Pietro Cassis. Alvan Dame, M.D.      ASSISTANT:  Danae Orleans, PA-C.      ANESTHESIA:  Regional and Spinal.      SPECIMENS:  None.      COMPLICATION:  None.      DRAINS:  None.  EBL: <200cc      TOURNIQUET TIME:   Total Tourniquet Time Documented: Thigh (Left) - 31 minutes Total: Thigh (Left) - 31 minutes       The patient was stable to the recovery room.      INDICATION FOR PROCEDURE:  Douglas Wiggins is a 53 y.o. male patient of   mine.  The patient had been seen, evaluated, and treated for months conservatively in the   office with medication, activity modification, and injections.  The patient had   radiographic changes of bone-on-bone arthritis with endplate sclerosis and osteophytes noted.  Based on the radiographic changes and failed conservative measures, the patient   decided to proceed with definitive treatment, total knee replacement.  Risks of infection, DVT, component failure, need for  revision surgery, neurovascular injury were reviewed in the office setting.  The postop course was reviewed stressing the efforts to maximize post-operative satisfaction and function.  Consent was obtained for benefit of pain   relief.      PROCEDURE IN DETAIL:  The patient was brought to the operative theater.   Once adequate anesthesia, preoperative antibiotics, 2 gm of Ancef, 1 gm of Vancomycin (MRSA positive screen),1 gm of Tranexamic Acid, and 10 mg of Decadron administered, the patient was positioned supine with a left thigh tourniquet placed.  The  left lower extremity was prepped and draped in sterile fashion.  A time-   out was performed identifying the patient, planned procedure, and the appropriate extremity.      The left lower extremity was placed in the [facility] leg holder.  The leg was   exsanguinated, tourniquet elevated to 250 mmHg.  A  midline incision was   made followed by median parapatellar arthrotomy.  Following initial   exposure, attention was first directed to the patella.  Precut   measurement was noted to be 27 mm.  I resected down to 15-16 mm and used a   38 anatomic patellar button to restore patellar height as well as cover the cut surface.      The lug holes were drilled and a metal shim was placed to protect the   patella from retractors and saw blade during the procedure.      At this point, attention was now directed to the femur.  The femoral   canal was opened with a drill, irrigated to try to prevent fat emboli.  An   intramedullary rod was passed at 5 degrees valgus, 9 mm of bone was   resected off the distal femur.  Following this resection, the tibia was   subluxated anteriorly.  Using the extramedullary guide, 2 mm of bone was resected off   the proximal medial tibia.  We confirmed the gap would be   stable medially and laterally with a size 5 spacer block as well as confirmed that the tibial cut was perpendicular in the coronal plane, checking with an  alignment rod.      Once this was done, I sized the femur to be a size 7 in the anterior-   posterior dimension, chose a standard component based on medial and   lateral dimension.  The size 7 rotation block was then pinned in   position anterior referenced using the C-clamp to set rotation.  The   anterior, posterior, and  chamfer cuts were made without difficulty nor   notching making certain that I was along the anterior cortex to help   with flexion gap stability.      The final box cut was made off the lateral aspect of distal femur.      At this point, the tibia was sized to be a size 7.  The size 7 tray was   then pinned in position through the medial third of the tubercle,   drilled, and keel punched.  Trial reduction was now carried with a 7 femur,  7 tibia, a size 6 mm PS insert, and the 38 anatomic patella botton.  The knee was brought to full extension with good flexion stability with the patella   tracking through the trochlea without application of pressure.  Given   all these findings the trial components removed.  Final components were   opened and cement was mixed.  The knee was irrigated with normal saline solution and pulse lavage.  The synovial lining was   then injected with 30 cc of 0.25% Marcaine with epinephrine, 1 cc of Toradol and 30 cc of NS for a total of 61 cc.     Final implants were then cemented onto cleaned and dried cut surfaces of bone with the knee brought to extension with a size 6 mm PS trial insert.      Once the cement had fully cured, excess cement was removed   throughout the knee.  I confirmed that I was satisfied with the range of   motion and stability, and the final size 6 mm PS AOX insert was chosen.  It was   placed into the knee.      The tourniquet had been let down at 31 minutes.  No significant   hemostasis was required.  The extensor mechanism was  then reapproximated using #1 Vicryl and #1 Stratafix sutures with the knee   in flexion.   The   remaining wound was closed with 2-0 Vicryl and running 4-0 Monocryl.   The knee was cleaned, dried, dressed sterilely using Dermabond and   Aquacel dressing.  The patient was then   brought to recovery room in stable condition, tolerating the procedure   well.   Please note that Physician Assistant, Danae Orleans, PA-C was present for the entirety of the case, and was utilized for pre-operative positioning, peri-operative retractor management, general facilitation of the procedure and for primary wound closure at the end of the case.              Pietro Cassis Alvan Dame, M.D.    05/25/2019 10:11 AM

## 2019-05-25 NOTE — Transfer of Care (Signed)
Immediate Anesthesia Transfer of Care Note  Patient: Douglas Wiggins  Procedure(s) Performed: TOTAL KNEE ARTHROPLASTY (Left )  Patient Location: PACU  Anesthesia Type:Spinal  Level of Consciousness: awake, alert , oriented and patient cooperative  Airway & Oxygen Therapy: Patient Spontanous Breathing and Patient connected to face mask oxygen  Post-op Assessment: Report given to RN, Post -op Vital signs reviewed and stable and Patient moving all extremities  Post vital signs: Reviewed and stable  Last Vitals:  Vitals Value Taken Time  BP 118/84 05/25/19 1045  Temp 36.4 C 05/25/19 1038  Pulse 68 05/25/19 1049  Resp 20 05/25/19 1049  SpO2 95 % 05/25/19 1049  Vitals shown include unvalidated device data.  Last Pain:  Vitals:   05/25/19 1038  PainSc: 0-No pain      Patients Stated Pain Goal: 4 (14/83/07 3543)  Complications: No apparent anesthesia complications

## 2019-05-25 NOTE — Progress Notes (Signed)
RN called attending physician about ancef antibiotic because patient has a sulfa allergy. Attending MD stated that ancef is what he would like post-op for this patient.

## 2019-05-25 NOTE — Plan of Care (Signed)
Plan of care for post op day 0 is discussed with patient.

## 2019-05-25 NOTE — Anesthesia Postprocedure Evaluation (Signed)
Anesthesia Post Note  Patient: Douglas Wiggins  Procedure(s) Performed: TOTAL KNEE ARTHROPLASTY (Left )     Patient location during evaluation: PACU Anesthesia Type: MAC and Spinal Level of consciousness: oriented and awake and alert Pain management: pain level controlled Vital Signs Assessment: post-procedure vital signs reviewed and stable Respiratory status: spontaneous breathing and respiratory function stable Cardiovascular status: blood pressure returned to baseline and stable Postop Assessment: no headache, no backache and no apparent nausea or vomiting Anesthetic complications: no    Last Vitals:  Vitals:   05/25/19 1130 05/25/19 1203  BP: 135/83 132/83  Pulse:  62  Resp: (!) 23 16  Temp: 36.8 C 36.7 C  SpO2:  100%    Last Pain:  Vitals:   05/25/19 1203  PainSc: 0-No pain                 Lynda Rainwater

## 2019-05-25 NOTE — Anesthesia Procedure Notes (Signed)
Spinal  Patient location during procedure: OR Start time: 05/25/2019 8:49 AM End time: 05/25/2019 8:54 AM Staffing Anesthesiologist: Lynda Rainwater, MD Performed: anesthesiologist  Preanesthetic Checklist Completed: patient identified, site marked, surgical consent, pre-op evaluation, timeout performed, IV checked, risks and benefits discussed and monitors and equipment checked Spinal Block Patient position: sitting Prep: DuraPrep Patient monitoring: heart rate, cardiac monitor, continuous pulse ox and blood pressure Approach: right paramedian Location: L3-4 Injection technique: single-shot Needle Needle type: Quincke  Needle gauge: 22 G Needle length: 9 cm Assessment Sensory level: T4

## 2019-05-25 NOTE — Discharge Instructions (Signed)

## 2019-05-26 ENCOUNTER — Encounter (HOSPITAL_COMMUNITY): Payer: Self-pay | Admitting: Orthopedic Surgery

## 2019-05-26 DIAGNOSIS — M1712 Unilateral primary osteoarthritis, left knee: Secondary | ICD-10-CM | POA: Diagnosis not present

## 2019-05-26 LAB — BASIC METABOLIC PANEL
Anion gap: 9 (ref 5–15)
BUN: 20 mg/dL (ref 6–20)
CO2: 26 mmol/L (ref 22–32)
Calcium: 8.8 mg/dL — ABNORMAL LOW (ref 8.9–10.3)
Chloride: 101 mmol/L (ref 98–111)
Creatinine, Ser: 0.86 mg/dL (ref 0.61–1.24)
GFR calc Af Amer: >60 mL/min (ref >60)
GFR calc non Af Amer: >60 mL/min (ref >60)
Glucose, Bld: 128 mg/dL — ABNORMAL HIGH (ref 70–99)
Potassium: 4 mmol/L (ref 3.5–5.1)
Sodium: 136 mmol/L (ref 135–145)

## 2019-05-26 LAB — CBC
HCT: 36.2 % — ABNORMAL LOW (ref 39.0–52.0)
Hemoglobin: 11.1 g/dL — ABNORMAL LOW (ref 13.0–17.0)
MCH: 24 pg — ABNORMAL LOW (ref 26.0–34.0)
MCHC: 30.7 g/dL (ref 30.0–36.0)
MCV: 78.4 fL — ABNORMAL LOW (ref 80.0–100.0)
Platelets: 190 10*3/uL (ref 150–400)
RBC: 4.62 MIL/uL (ref 4.22–5.81)
RDW: 15.8 % — ABNORMAL HIGH (ref 11.5–15.5)
WBC: 11.3 10*3/uL — ABNORMAL HIGH (ref 4.0–10.5)
nRBC: 0 % (ref 0.0–0.2)

## 2019-05-26 MED ORDER — HYDROCODONE-ACETAMINOPHEN 7.5-325 MG PO TABS: 1.0000 | ORAL_TABLET | ORAL | 0 refills | Status: DC | PRN

## 2019-05-26 MED ORDER — METHOCARBAMOL 500 MG PO TABS: 500.0000 mg | ORAL_TABLET | Freq: Four times a day (QID) | ORAL | 0 refills | Status: DC | PRN

## 2019-05-26 MED ORDER — POLYETHYLENE GLYCOL 3350 17 G PO PACK: 17.0000 g | PACK | Freq: Two times a day (BID) | ORAL | 0 refills | Status: DC

## 2019-05-26 MED ORDER — FERROUS SULFATE 325 (65 FE) MG PO TABS: 325.0000 mg | ORAL_TABLET | Freq: Three times a day (TID) | ORAL | 0 refills | Status: DC

## 2019-05-26 MED ORDER — ASPIRIN 81 MG PO CHEW: 81.0000 mg | CHEWABLE_TABLET | Freq: Two times a day (BID) | ORAL | 0 refills | Status: AC

## 2019-05-26 MED ORDER — DOCUSATE SODIUM 100 MG PO CAPS: 100.0000 mg | ORAL_CAPSULE | Freq: Two times a day (BID) | ORAL | 0 refills | Status: DC

## 2019-05-26 NOTE — Progress Notes (Signed)
     Subjective: 1 Day Post-Op Procedure(s) (LRB): TOTAL KNEE ARTHROPLASTY (Left)   Patient reports pain as mild, controlled with with medications.  No reported events throughout the night.  Dr. Alvan Dame discussed the procedure and expectations moving forward.  Ready to be discharged home if they do well with PT.   Patient's anticipated LOS is less than 2 midnights, meeting these requirements: - Younger than 62 - Lives within 1 hour of care - Has a competent adult at home to recover with post-op recover - NO history of  - Chronic pain requiring opiods  - Diabetes  - Coronary Artery Disease  - Heart failure  - Heart attack  - Stroke  - DVT/VTE  - Cardiac arrhythmia  - Respiratory Failure/COPD  - Renal failure  - Anemia  - Advanced Liver disease       Objective:   VITALS:   Vitals:   05/26/19 0159 05/26/19 0542  BP: 136/87 (!) 152/88  Pulse: 80 75  Resp: 18 16  Temp: 98.3 F (36.8 C) 98.8 F (37.1 C)  SpO2: 100% 98%    Dorsiflexion/Plantar flexion intact Incision: dressing C/D/I No cellulitis present Compartment soft  LABS Recent Labs    05/26/19 0457  HGB 11.1*  HCT 36.2*  WBC 11.3*  PLT 190    Recent Labs    05/26/19 0457  NA 136  K 4.0  BUN 20  CREATININE 0.86  GLUCOSE 128*     Assessment/Plan: 1 Day Post-Op Procedure(s) (LRB): TOTAL KNEE ARTHROPLASTY (Left) Foley cath d/c'ed Advance diet Up with therapy D/C IV fluids Discharge home with home health Follow up in 2 weeks at [facility] ([place] Follow up with OLIN,Raaga Maeder D in 2 weeks.  Contact information:  [facility]) [address], [place] [phone]    Overweight (BMI 25-29.9)  Estimated body mass index is 29.46 kg/m as calculated from the following:   Height as of this encounter: 6\' 4"  (1.93 m).   Weight as of this encounter: 109.8 kg. Patient also counseled that weight may inhibit  the healing process Patient counseled that losing weight will help with future health issues      [place] Pugh. Tamani Durney   PAC  05/26/2019, 7:54 AM

## 2019-05-26 NOTE — Progress Notes (Signed)
Physical Therapy Treatment Patient Details Name: Douglas Wiggins MRN: [id number] DOB: 05/11/1966 Today's Date: 05/26/2019    History of Present Illness 53 yo male s/p L TKR on 05/25/19. 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    PT Comments    Pt progressing with all mobility tasks but tolerating min WB on L LE with ambulation.  Pt eager for dc home and reviewed single step to access home and home therex program with written instruction provided.  Pt states he has OP PT as follow up but unsure of when first appt is set.   Follow Up Recommendations  Follow surgeon's recommendation for DC plan and follow-up therapies;Supervision for mobility/OOB     Equipment Recommendations  None recommended by PT    Recommendations for Other Services       Precautions / Restrictions Precautions Precautions: Fall Restrictions Weight Bearing Restrictions: No Other Position/Activity Restrictions: WBAT    Mobility  Bed Mobility Overal bed mobility: Needs Assistance Bed Mobility: Supine to Sit;Sit to Supine     Supine to sit: Supervision Sit to supine: Supervision   General bed mobility comments: cues for technique and use of bed rail but no physical assist  Transfers Overall transfer level: Needs assistance Equipment used: Rolling walker (2 wheeled) Transfers: Sit to/from Stand Sit to Stand: Min guard;Supervision         General transfer comment: Min guard for safety, verbal cuing for hand placement when rising.  Ambulation/Gait Ambulation/Gait assistance: Min guard;Supervision Gait Distance (Feet): 80 Feet Assistive device: Rolling walker (2 wheeled) Gait Pattern/deviations: Step-to pattern;Decreased step length - right;Decreased step length - left;Antalgic;Trunk flexed Gait velocity: decr   General Gait Details: min cues for sequence, posture, increased L knee  ext and heel contact and position from RW.  Pt tolerating min WB on L LE   Stairs Stairs: Yes Stairs assistance: Min assist Stair Management: No rails;Step to pattern;Backwards;Forwards;With walker Number of Stairs: 3 General stair comments: single step once fwd with significant effort.  single step twice bkwd with marked decrease in pt effort   Wheelchair Mobility    Modified Rankin (Stroke Patients Only)       Balance Overall balance assessment: Mild deficits observed, not formally tested                                          Cognition Arousal/Alertness: Awake/alert Behavior During Therapy: WFL for tasks assessed/performed Overall Cognitive Status: History of cognitive impairments - at baseline                                 General Comments: history of TBI, pt very talkative and at times tactless with statements, but otherwise cognition [facility] for session.      Exercises Total Joint Exercises Ankle Circles/Pumps: AROM;Both;20 reps;Supine Quad Sets: AROM;Both;10 reps;Supine Heel Slides: AAROM;Left;15 reps;Supine Straight Leg Raises: AAROM;AROM;Left;10 reps;Supine Long Arc Quad: AAROM;AROM;Left;10 reps;Seated Knee Flexion: AAROM;AROM;Left;10 reps Goniometric ROM: AAROM L knee -12 - 80    General Comments        Pertinent Vitals/Pain Pain Assessment: 0-10 Pain Score: 7  Pain Location: L knee Pain Descriptors / Indicators: Aching;Sore Pain Intervention(s): Limited activity within patient's tolerance;Monitored during session;Premedicated before session;Ice applied    Home Living  Prior Function            PT Goals (current goals can now be found in the care plan section) Acute Rehab PT Goals Patient Stated Goal: go home PT Goal Formulation: With patient Time For Goal Achievement: 06/01/19 Potential to Achieve Goals: Good Progress towards PT goals: Progressing toward goals    Frequency     7X/week      PT Plan Current plan remains appropriate    Co-evaluation              AM-PAC PT "6 Clicks" Mobility   Outcome Measure  Help needed turning from your back to your side while in a flat bed without using bedrails?: A Little Help needed moving from lying on your back to sitting on the side of a flat bed without using bedrails?: A Little Help needed moving to and from a bed to a chair (including a wheelchair)?: A Little Help needed standing up from a chair using your arms (e.g., wheelchair or bedside chair)?: A Little Help needed to walk in hospital room?: A Little Help needed climbing 3-5 steps with a railing? : A Little 6 Click Score: 18    End of Session Equipment Utilized During Treatment: Gait belt Activity Tolerance: Patient tolerated treatment well Patient left: in bed;with call bell/phone within reach Nurse Communication: Mobility status PT Visit Diagnosis: Other abnormalities of gait and mobility (R26.89);Difficulty in walking, not elsewhere classified (R26.2)     Time: 8118-8677 PT Time Calculation (min) (ACUTE ONLY): 40 min  Charges:  $Gait Training: 8-22 mins $Therapeutic Exercise: 8-22 mins $Therapeutic Activity: 8-22 mins                     [place] Pager [phone] Office [phone]    Onyx Schirmer 05/26/2019, 11:49 AM

## 2019-05-27 NOTE — Discharge Summary (Signed)
Physician Discharge Summary  Patient ID: Douglas Wiggins MRN: [id number] DOB/AGE: 1966-07-30 53 y.o.  Admit date: 05/25/2019 Discharge date: 05/26/2019   Procedures:  Procedure(s) (LRB): TOTAL KNEE ARTHROPLASTY (Left)  Attending Physician:  Dr. [name]   Admission Diagnoses:   Left knee primary OA / pain  Discharge Diagnoses:  Principal Problem:   S/P left TKA Active Problems:   Overweight (BMI 25.0-29.9)  Past Medical History:  Diagnosis Date  . ADHD   . Chronic lower back pain   . Complication of anesthesia    Pt reports that he 'hiccups' 2 days following administration of anesthesia  . Depression   . Diverticulosis of colon   . GERD (gastroesophageal reflux disease)   . Gout    per pt last flare-up 2018  . Hiatal hernia   . History of closed head injury 1992   traumatic---  per pt in coma few days,  residual memory issues  . History of diverticulitis of colon    s/p  colon resection for sigmoid perforation 01-06-2012  . History of sepsis 05/2017  . Hyperlipidemia   . Hypertension   . Hypothyroidism   . Lichen planus   . Memory difficulty    seconday traumatic head injury 1992  . Migraines   . Numbness and tingling of right arm    due to  cervical nerve damage  . OA (osteoarthritis)    right hip,  back, knees, ankles, neck  . OSA (obstructive sleep apnea)    does not wear cpap/states MILD- no sleep study in years  . Sciatica, right side   . Wears glasses   . Wears partial dentures    upper     HPI:    Douglas Wiggins, 53 y.o. male, has a history of pain and functional disability in the left knee due to arthritis and has failed non-surgical conservative treatments for greater than 12 weeks to include NSAID's and/or analgesics, corticosteriod injections, use of assistive devices and activity modification.  Onset of symptoms was gradual, starting  years ago with gradually worsening course since that time. The patient noted no past surgery on the left  knee(s).  Patient currently rates pain in the left knee(s) at 9 out of 10 with activity. Patient has night pain, worsening of pain with activity and weight bearing, pain that interferes with activities of daily living, pain with passive range of motion, crepitus and joint swelling.  Patient has evidence of periarticular osteophytes and joint space narrowing by imaging studies. There is no active infection.  Risks, benefits and expectations were discussed with the patient.  Risks including but not limited to the risk of anesthesia, blood clots, nerve damage, blood vessel damage, failure of the prosthesis, infection and up to and including death.  Patient understand the risks, benefits and expectations and wishes to proceed with surgery.   PCP: Octavio Graves, DO   Discharged Condition: good  Hospital Course:  Patient underwent the above stated procedure on 05/25/2019. Patient tolerated the procedure well and brought to the recovery room in good condition and subsequently to the floor.  POD #1 BP: 152/88 ; Pulse: 75 ; Temp: 98.8 F (37.1 C) ; Resp: 16 Patient reports pain as mild, controlled with with medications.  No reported events throughout the night.  Dr. Alvan Dame discussed the procedure and expectations moving forward.  Ready to be discharged home. Dorsiflexion/plantar flexion intact, incision: dressing C/D/I, no cellulitis present and compartment soft.   LABS  Basename  HGB     11.1  HCT     36.2    Discharge Exam: General appearance: alert, cooperative and no distress Extremities: Homans sign is negative, no sign of DVT, no edema, redness or tenderness in the calves or thighs and no ulcers, gangrene or trophic changes  Disposition:  Home with follow up in 2 weeks   Follow-up Information    [name], MD. Schedule an appointment as soon as possible for a visit in 2 weeks.   Specialty: Orthopedic Surgery Contact information: [address] [place] [address]  [phone]           Discharge Instructions    Call MD / Call 911   Complete by: As directed    If you experience chest pain or shortness of breath, CALL 911 and be transported to the hospital emergency room.  If you develope a fever above 101 F, pus (white drainage) or increased drainage or redness at the wound, or calf pain, call your surgeon's office.   Change dressing   Complete by: As directed    Maintain surgical dressing until follow up in the clinic. If the edges start to pull up, may reinforce with tape. If the dressing is no longer working, may remove and cover with gauze and tape, but must keep the area dry and clean.  Call with any questions or concerns.   Constipation Prevention   Complete by: As directed    Drink plenty of fluids.  Prune juice may be helpful.  You may use a stool softener, such as Colace (over the counter) 100 mg twice a day.  Use MiraLax (over the counter) for constipation as needed.   Diet - low sodium heart healthy   Complete by: As directed    Discharge instructions   Complete by: As directed    Maintain surgical dressing until follow up in the clinic. If the edges start to pull up, may reinforce with tape. If the dressing is no longer working, may remove and cover with gauze and tape, but must keep the area dry and clean.  Follow up in 2 weeks at [facility]. Call with any questions or concerns.   Increase activity slowly as tolerated   Complete by: As directed    Weight bearing as tolerated with assist device (walker, cane, etc) as directed, use it as long as suggested by your surgeon or therapist, typically at least 4-6 weeks.   TED hose   Complete by: As directed    Use stockings (TED hose) for 2 weeks on both leg(s).  You may remove them at night for sleeping.      Allergies as of 05/26/2019      Reactions   Sulfa Antibiotics Hives, Rash      Medication List    STOP taking these medications   acetaminophen-codeine 300-30 MG  tablet Commonly known as: TYLENOL #3   cyclobenzaprine 10 MG tablet Commonly known as: FLEXERIL   ibuprofen 800 MG tablet Commonly known as: ADVIL   meloxicam 15 MG tablet Commonly known as: MOBIC     TAKE these medications   amoxicillin 500 MG capsule Commonly known as: AMOXIL Take 500 mg by mouth 4 (four) times daily.   amphetamine-dextroamphetamine 20 MG tablet Commonly known as: ADDERALL Take 20 mg by mouth daily.   aspirin 81 MG chewable tablet Commonly known as: Aspirin Childrens Chew 1 tablet (81 mg total) by mouth 2 (two) times daily for 30 days. Take for  4 weeks, then resume regular dose.   carbamazepine 200 MG tablet Commonly known as: TEGRETOL Take 100 mg by mouth 3 (three) times daily.   docusate sodium 100 MG capsule Commonly known as: Colace Take 1 capsule (100 mg total) by mouth 2 (two) times daily.   DULoxetine 60 MG capsule Commonly known as: CYMBALTA Take 60 mg by mouth daily.   ferrous sulfate 325 (65 FE) MG tablet Commonly known as: FerrouSul Take 1 tablet (325 mg total) by mouth 3 (three) times daily with meals for 14 days.   HYDROcodone-acetaminophen 7.5-325 MG tablet Commonly known as: Norco Take 1-2 tablets by mouth every 4 (four) hours as needed for moderate pain.   levothyroxine 112 MCG tablet Commonly known as: SYNTHROID Take 112 mcg by mouth daily before breakfast.   losartan-hydrochlorothiazide 100-12.5 MG tablet Commonly known as: HYZAAR Take 1 tablet by mouth daily.   methocarbamol 500 MG tablet Commonly known as: Robaxin Take 1 tablet (500 mg total) by mouth every 6 (six) hours as needed for muscle spasms.   pantoprazole 40 MG tablet Commonly known as: PROTONIX Take 1 tablet (40 mg total) by mouth at bedtime. What changed: when to take this   polyethylene glycol 17 g packet Commonly known as: MIRALAX / GLYCOLAX Take 17 g by mouth 2 (two) times daily.            Discharge Care Instructions  (From admission,  onward)         Start     Ordered   05/26/19 0000  Change dressing    Comments: Maintain surgical dressing until follow up in the clinic. If the edges start to pull up, may reinforce with tape. If the dressing is no longer working, may remove and cover with gauze and tape, but must keep the area dry and clean.  Call with any questions or concerns.   05/26/19 3536           Signed: [place] Pugh. Toye Rouillard   PA-C  05/27/2019, 8:24 AM

## 2019-05-31 ENCOUNTER — Ambulatory Visit: Payer: Medicaid Other | Attending: Orthopedic Surgery | Admitting: Physical Therapy

## 2019-05-31 ENCOUNTER — Other Ambulatory Visit: Payer: Self-pay

## 2019-05-31 ENCOUNTER — Encounter: Payer: Self-pay | Admitting: Physical Therapy

## 2019-05-31 ENCOUNTER — Ambulatory Visit: Payer: Self-pay | Admitting: Physical Therapy

## 2019-05-31 DIAGNOSIS — M25662 Stiffness of left knee, not elsewhere classified: Secondary | ICD-10-CM | POA: Insufficient documentation

## 2019-05-31 DIAGNOSIS — R262 Difficulty in walking, not elsewhere classified: Secondary | ICD-10-CM | POA: Diagnosis present

## 2019-05-31 DIAGNOSIS — M25562 Pain in left knee: Secondary | ICD-10-CM | POA: Diagnosis present

## 2019-05-31 NOTE — Therapy (Signed)
[facility] [place], [place], [address] Phone: [phone]   Fax:  [phone]  Physical Therapy Evaluation  Patient Details  Name: Douglas Wiggins MRN: [id number] Date of Birth: 1966/05/06 Referring Provider (PT): [name], MD   Encounter Date: 05/31/2019  PT End of Session - 05/31/19 1519    Visit Number  1    Number of Visits  18    Date for PT Re-Evaluation  07/26/19    Authorization Type  Medicaid    PT Start Time  1400    PT Stop Time  4235    PT Time Calculation (min)  42 min    Activity Tolerance  Patient tolerated treatment well    Behavior During Therapy  [facility] for tasks assessed/performed       Past Medical History:  Diagnosis Date  . ADHD   . Chronic lower back pain   . Complication of anesthesia    Pt reports that he 'hiccups' 2 days following administration of anesthesia  . Depression   . Diverticulosis of colon   . GERD (gastroesophageal reflux disease)   . Gout    per pt last flare-up 2018  . Hiatal hernia   . History of closed head injury 1992   traumatic---  per pt in coma few days,  residual memory issues  . History of diverticulitis of colon    s/p  colon resection for sigmoid perforation 01-06-2012  . History of sepsis 05/2017  . Hyperlipidemia   . Hypertension   . Hypothyroidism   . Lichen planus   . Memory difficulty    seconday traumatic head injury 1992  . Migraines   . Numbness and tingling of right arm    due to  cervical nerve damage  . OA (osteoarthritis)    right hip,  back, knees, ankles, neck  . OSA (obstructive sleep apnea)    does not wear cpap/states MILD- no sleep study in years  . Sciatica, right side   . Wears glasses   . Wears partial dentures    upper     Past Surgical History:  Procedure Laterality Date  . ANKLE ARTHROSCOPY Right 05-20-2003   dr graves @[facility]   . APPLICATION OF WOUND VAC  10/29/2012   Procedure: APPLICATION OF WOUND VAC;  Surgeon: Imogene Burn. Georgette Dover, MD;  Location: [place] [facility];  Service: General;;  . CARPAL TUNNEL RELEASE Left 1990's  . COLON RESECTION  10/26/2012   Procedure: COLON RESECTION;  Surgeon: Imogene Burn. Georgette Dover, MD;  Location: [place] [facility];  Service: General;  Laterality: N/A;  Partial colon resection with anastomosis  . COLOSTOMY CLOSURE  04/13/2012   Procedure: COLOSTOMY CLOSURE;  Surgeon: Imogene Burn. Georgette Dover, MD;  Location: [facility];  Service: General;  Laterality: N/A;  . COLOSTOMY REVISION  01/06/2012   Procedure: COLON RESECTION SIGMOID;  Surgeon: Imogene Burn. Georgette Dover, MD;  Location: [facility];  Service: General;  Laterality: N/A;  with hartmans procedure  . HAND SURGERY Left 08-02-2009   dr Burney Gauze @[facility]    repair left fifth digit crush injury  . INSERTION OF MESH N/A 11/10/2013   Procedure: INSERTION OF MESH;  Surgeon: Imogene Burn. Georgette Dover, MD;  Location: [place];  Service: General;  Laterality: N/A;  . KNEE ARTHROSCOPY Right 1990's  . LAPAROTOMY  10/26/2012   Procedure: EXPLORATORY LAPAROTOMY;  Surgeon: Imogene Burn. Georgette Dover, MD;  Location: [place];  Service: General;  Laterality: N/A;  . LAPAROTOMY  10/27/2012   Procedure: EXPLORATORY LAPAROTOMY;  Surgeon:  Zenovia Jarred, MD;  Location: [place];  Service: General;  Laterality: N/A;  . LAPAROTOMY  10/29/2012   Procedure: EXPLORATORY LAPAROTOMY;  Surgeon: Imogene Burn. Georgette Dover, MD;  Location: [place];  Service: General;  Laterality: N/A;  exploratory laparotomy, partial closure of abdominal wound,    . SHOULDER ARTHROSCOPY Right 10-06-2008  dr supple  @[facility]   . TOTAL HIP ARTHROPLASTY Right 01/19/2019   Procedure: TOTAL HIP ARTHROPLASTY ANTERIOR APPROACH;  Surgeon: [name], MD;  Location: [facility];  Service: Orthopedics;  Laterality: Right;  70 mins  . TOTAL KNEE ARTHROPLASTY Left 05/25/2019   Procedure: TOTAL KNEE ARTHROPLASTY;  Surgeon: [name], MD;  Location: [facility];  Service: Orthopedics;  Laterality: Left;  31mins  . VENTRAL HERNIA REPAIR  10/26/2012   Procedure: HERNIA REPAIR VENTRAL ADULT;   Surgeon: Imogene Burn. Georgette Dover, MD;  Location: [place];  Service: General;  Laterality: N/A;  Primary Repair Ventral Hernia  . VENTRAL HERNIA REPAIR N/A 11/10/2013   Procedure: LAPAROSCOPIC VENTRAL HERNIA ;  Surgeon: Imogene Burn. Georgette Dover, MD;  Location: [place];  Service: General;  Laterality: N/A;    There were no vitals filed for this visit.   Subjective Assessment - 05/31/19 1510    Subjective  COVID-19 screening performed prior to patient entering the building. Patient arrives to physical therapy with left knee pain, stiffness, and difficulty walking secondary to a left TKA on 05/25/2019. Patient reports using the walker for ambulation within his home. Patient requires assistance from mother for meal preparation and other ADLs. Patient reports being compliant with HEP provided by the hospital. Patient reports pain at worst is 9/10 and pain at best is 5/10 with pain medication. Patient's goals are to decrease pain, improve movement, improve ability to sleep, improve strength, improve ability to perform home activities, and return to recreational activities.    Pertinent History  HTN, left TKA 6/23/220, right THA 01/19/2019, Ventral Hernia repair 2013, shoulder arthroscopy 10/06/2008    Limitations  Sitting;Standing;Walking;House hold activities    Patient Stated Goals  walk without walker, get back to normal    Currently in Pain?  Yes    Pain Score  6     Pain Location  Knee    Pain Orientation  Left    Pain Descriptors / Indicators  Aching;Sharp;Sore    Pain Type  Surgical pain    Pain Onset  In the past 7 days    Pain Frequency  Constant    Aggravating Factors   compression stockings    Pain Relieving Factors  Ice    Effect of Pain on Daily Activities  "harder to do daily activities"         [facility] PT Assessment - 05/31/19 0001      Assessment   Medical Diagnosis  Presence of left artificial knee joint    Referring Provider (PT)  [name], MD    Onset Date/Surgical Date  05/25/19    Next MD  Visit  06/08/2019    Prior Therapy  no      Precautions   Precautions  Other (comment)    Precaution Comments  no ultrasound      Restrictions   Weight Bearing Restrictions  No      Balance Screen   Has the patient fallen in the past 6 months  Yes    How many times?  2-3    Has the patient had a decrease in activity level because of a fear of falling?   Yes  Is the patient reluctant to leave their home because of a fear of falling?   Yes      [place] to enter    Entrance Stairs-Number of Steps  1      Prior Function   Level of Independence  Independent with household mobility with device;Needs assistance with homemaking;Needs assistance with ADLs      Observation/Other Assessments   Observations  notable ecchymosis throughout left knee, aquacel dressing donned      Observation/Other Assessments-Edema    Edema  Circumferential      Circumferential Edema   Circumferential - Right  40 cm at mid patella    Circumferential - Left   47 cm at mid patella      ROM / Strength   AROM / PROM / Strength  AROM;PROM      AROM   Overall AROM   Deficits;Due to pain    AROM Assessment Site  Knee    Right/Left Knee  Left    Left Knee Extension  20    Left Knee Flexion  100      PROM   Overall PROM   Deficits;Due to pain    PROM Assessment Site  Knee    Right/Left Knee  Left    Left Knee Extension  16    Left Knee Flexion  103      Palpation   Palpation comment  very tender to lateral aspect of lower leg and around medial joint line      Transfers   Transfers  Independent with all Transfers    Comments  use of UEs to move leg on/off plinth      Ambulation/Gait   Assistive device  Rolling walker    Gait Pattern  Step-through pattern;Step-to pattern;Decreased step length - right;Decreased stance time - left;Decreased stride length;Decreased weight shift to left;Left flexed  knee in stance;Antalgic    Gait Comments  increased UE support on walker while ambulating                Objective measurements completed on examination: See above findings.              PT Education - 05/31/19 1519    Education Details  heel prop for extension, seated hamstring stretch, seated knee flexion stretch    Person(s) Educated  Patient    Methods  Explanation;Demonstration;Handout    Comprehension  Verbalized understanding;Returned demonstration       PT Short Term Goals - 05/31/19 1525      PT SHORT TERM GOAL #1   Title  Patient will be independent with HEP    Baseline  no knowledge of exercises    Time  3    Period  Weeks    Status  New      PT SHORT TERM GOAL #2   Title  Patient will demonstrate 16 degrees or less of left knee extension AROM to improve gait mechanics.    Baseline  left knee extension AROM 20 degrees from neutral    Time  3    Period  Weeks    Status  New        PT Long Term Goals - 05/31/19 1527      PT LONG TERM GOAL #1   Title  Patient will be independent with advanced HEP    Baseline  no knowledge  of exercises    Time  6    Period  Weeks    Status  New      PT LONG TERM GOAL #2   Title  Patient will demonstrate 115+ degrees of left knee flexion AROM to improve mobility during functional tasks.    Baseline  20-100 degrees of left knee AROM.    Time  6    Period  Weeks    Status  New      PT LONG TERM GOAL #3   Title  Patient will demonstrate 8 degrees or less of left knee extension AROM to improve gait mechanics    Baseline  20-100 degrees of left knee AROM    Time  6    Period  Weeks    Status  New      PT LONG TERM GOAL #4   Title  Patient will report ability to perform ADLs with left knee pain less than or equal to 4/10    Baseline  9/10 with ADLs    Time  6    Period  Weeks    Status  New      PT LONG TERM GOAL #5   Title  Patient will demonstrate 4+/5 or greater left knee AROM to improve  stability during functional tasks.    Baseline  not assessed secondary to precaution    Time  6    Period  Weeks    Status  New             Plan - 05/31/19 1520    Clinical Impression Statement  Patient is a 53 year old male who presents to physical therapy with decreased left knee ROM, increased left knee pain, and difficulty walking secondary to a left total knee replacement on 05/25/2019. Patient noted with increased localized edema, moderate ecchymosis throughout the left LE, and increased warmth to touch. Patient uses UEs to assist left LE on/off plinth during transitional movements. Patient noted externally rotating the left LE to decreased pain; patient was educated on proper supine positioning. Patient reported understanding. Patient and PT reviewed HEP and instructed patient to continue HEP provided by the hosptial. Patient ambulates with a varying step to and step through gait pattern with increased left knee flexion during stance, decreased left stance time, and increased bilateral UE support on rolling walker. Patient would benefit from skilled physical therapy to address deficits and address patient's goals.    Personal Factors and Comorbidities  Age;Comorbidity 1    Comorbidities  HTN    Examination-Activity Limitations  Bed Mobility;Stairs;Stand;Sleep    Examination-Participation Restrictions  Meal Prep;Driving;Yard Work    Stability/Clinical Decision Making  Stable/Uncomplicated    Designer, jewellery  Low    Rehab Potential  Good    PT Frequency  3x / week    PT Duration  6 weeks    PT Treatment/Interventions  ADLs/Self Care Home Management;Electrical Stimulation;Moist Heat;Balance training;Therapeutic exercise;Therapeutic activities;Functional mobility training;Stair training;Gait training;Neuromuscular re-education;Passive range of motion;Scar mobilization;Manual techniques;Taping;Vasopneumatic Device    PT Next Visit Plan  Nustep, standing knee AROM, gait training  for step through gait pattern, left knee PROM modalities PRN for pain relief    PT Home Exercise Plan  see patient education section    Consulted and Agree with Plan of Care  Patient       Patient will benefit from skilled therapeutic intervention in order to improve the following deficits and impairments:  Decreased activity tolerance, Decreased balance, Decreased range of  motion, Decreased skin integrity, Decreased strength, Increased edema, Difficulty walking, Pain  Visit Diagnosis: 1. Acute pain of left knee   2. Stiffness of left knee, not elsewhere classified   3. Difficulty in walking, not elsewhere classified        Problem List Patient Active Problem List   Diagnosis Date Noted  . S/P left TKA 05/25/2019  . S/P hip replacement, right 01/21/2019  . Overweight (BMI 25.0-29.9) 01/20/2019  . S/P right THA, AA 01/19/2019  . Substance abuse ([place]) 05/24/2017  . Elevated troponin 05/24/2017  . Diarrhea 05/24/2017  . Sepsis ([place]) 05/23/2017  . Ventral hernia 11/10/2013  . Recurrent ventral incisional hernia 08/26/2013  . Disruption of wound, unspecified 03/01/2013  . Respiratory failure, post-operative ([place]) 10/28/2012  . Acute renal insufficiency, nonoliguric 10/28/2012  . Status post laparotomy, evac of hemoperitoneum and wound vac placement 11/26 10/28/2012  .  Ex lap with partial colectomy with revision of colon anastomosis, repair of ventral hernia 11/25 10/28/2012  . Encephalopathy acute 10/27/2012  . Abdominal compartment syndrome, post lap 11/26 10/27/2012  . Shock due to abdominal compartment syndrome and acute blood loss 10/27/2012  . Hemoperitoneum, resolved post lap 11/26 10/27/2012  . H/O Hypertension 01/06/2012  . Hypothyroid 01/06/2012   Gabriela Eves, PT, DPT 05/31/2019, 3:34 PM  [facility] [address] [place], [place], [address] Phone: [phone]   Fax:  [phone]  Name: Douglas Wiggins MRN:  [id number] Date of Birth: [birth year]

## 2019-06-08 ENCOUNTER — Encounter: Payer: Self-pay | Admitting: Physical Therapy

## 2019-06-08 ENCOUNTER — Other Ambulatory Visit: Payer: Self-pay

## 2019-06-08 ENCOUNTER — Ambulatory Visit: Payer: Medicaid Other | Attending: Orthopedic Surgery | Admitting: Physical Therapy

## 2019-06-08 DIAGNOSIS — M25662 Stiffness of left knee, not elsewhere classified: Secondary | ICD-10-CM | POA: Insufficient documentation

## 2019-06-08 DIAGNOSIS — M25562 Pain in left knee: Secondary | ICD-10-CM | POA: Diagnosis present

## 2019-06-08 DIAGNOSIS — R262 Difficulty in walking, not elsewhere classified: Secondary | ICD-10-CM | POA: Insufficient documentation

## 2019-06-08 NOTE — Therapy (Signed)
[facility] [place], [place], [address] Phone: [phone]   Fax:  [phone]  Physical Therapy Treatment  Patient Details  Name: Douglas Wiggins MRN: [id number] Date of Birth: Apr 19, 1966 Referring Provider (PT): [name], MD   Encounter Date: 06/08/2019  PT End of Session - 06/08/19 1042    Visit Number  2    Number of Visits  18    Date for PT Re-Evaluation  07/26/19    Authorization Type  Medicaid    PT Start Time  1032    PT Stop Time  1119    PT Time Calculation (min)  47 min    Equipment Utilized During Treatment  Other (comment)   SPC   Activity Tolerance  Patient tolerated treatment well    Behavior During Therapy  [name] for tasks assessed/performed       Past Medical History:  Diagnosis Date  . ADHD   . Chronic lower back pain   . Complication of anesthesia    Pt reports that he 'hiccups' 2 days following administration of anesthesia  . Depression   . Diverticulosis of colon   . GERD (gastroesophageal reflux disease)   . Gout    per pt last flare-up 2018  . Hiatal hernia   . History of closed head injury 1992   traumatic---  per pt in coma few days,  residual memory issues  . History of diverticulitis of colon    s/p  colon resection for sigmoid perforation 01-06-2012  . History of sepsis 05/2017  . Hyperlipidemia   . Hypertension   . Hypothyroidism   . Lichen planus   . Memory difficulty    seconday traumatic head injury 1992  . Migraines   . Numbness and tingling of right arm    due to  cervical nerve damage  . OA (osteoarthritis)    right hip,  back, knees, ankles, neck  . OSA (obstructive sleep apnea)    does not wear cpap/states MILD- no sleep study in years  . Sciatica, right side   . Wears glasses   . Wears partial dentures    upper     Past Surgical History:  Procedure Laterality Date  . ANKLE ARTHROSCOPY Right 05-20-2003   dr graves @[facility]   . APPLICATION OF WOUND VAC   10/29/2012   Procedure: APPLICATION OF WOUND VAC;  Surgeon: Imogene Burn. Georgette Dover, MD;  Location: [place] [facility];  Service: General;;  . CARPAL TUNNEL RELEASE Left 1990's  . COLON RESECTION  10/26/2012   Procedure: COLON RESECTION;  Surgeon: Imogene Burn. Georgette Dover, MD;  Location: [place] [facility];  Service: General;  Laterality: N/A;  Partial colon resection with anastomosis  . COLOSTOMY CLOSURE  04/13/2012   Procedure: COLOSTOMY CLOSURE;  Surgeon: Imogene Burn. Georgette Dover, MD;  [facility];  Service: General;  Laterality: N/A;  . COLOSTOMY REVISION  01/06/2012   Procedure: COLON RESECTION SIGMOID;  Surgeon: Imogene Burn. Georgette Dover, MD;  [facility];  Service: General;  Laterality: N/A;  with hartmans procedure  . HAND SURGERY Left 08-02-2009   dr Burney Gauze @[facility]    repair left fifth digit crush injury  . INSERTION OF MESH N/A 11/10/2013   Procedure: INSERTION OF MESH;  Surgeon: Imogene Burn. Georgette Dover, MD;  Location: [place];  Service: General;  Laterality: N/A;  . KNEE ARTHROSCOPY Right 1990's  . LAPAROTOMY  10/26/2012   Procedure: EXPLORATORY LAPAROTOMY;  Surgeon: Imogene Burn. Georgette Dover, MD;  Location: [place];  Service: General;  Laterality: N/A;  .  LAPAROTOMY  10/27/2012   Procedure: EXPLORATORY LAPAROTOMY;  Surgeon: Zenovia Jarred, MD;  Location: [place];  Service: General;  Laterality: N/A;  . LAPAROTOMY  10/29/2012   Procedure: EXPLORATORY LAPAROTOMY;  Surgeon: Imogene Burn. Georgette Dover, MD;  Location: [place];  Service: General;  Laterality: N/A;  exploratory laparotomy, partial closure of abdominal wound,    . SHOULDER ARTHROSCOPY Right 10-06-2008  dr supple  @[facility]   . TOTAL HIP ARTHROPLASTY Right 01/19/2019   Procedure: TOTAL HIP ARTHROPLASTY ANTERIOR APPROACH;  Surgeon: [name], MD;  [facility];  Service: Orthopedics;  Laterality: Right;  70 mins  . TOTAL KNEE ARTHROPLASTY Left 05/25/2019   Procedure: TOTAL KNEE ARTHROPLASTY;  Surgeon: [name], MD;  [facility];  Service: Orthopedics;  Laterality: Left;  21mns  . VENTRAL  HERNIA REPAIR  10/26/2012   Procedure: HERNIA REPAIR VENTRAL ADULT;  Surgeon: MImogene Burn TGeorgette Dover MD;  [place]  Service: General;  Laterality: N/A;  Primary Repair Ventral Hernia  . VENTRAL HERNIA REPAIR N/A 11/10/2013   Procedure: LAPAROSCOPIC VENTRAL HERNIA ;  Surgeon: MImogene Burn TGeorgette Dover MD;  [place]  Service: General;  Laterality: N/A;    There were no vitals filed for this visit.  Subjective Assessment - 06/08/19 1030    Subjective  COVID 19 screening performed on patient upon arrival. Patient reports he is walking and [facility] Reports some discomfort in lateral calf and knee and also low back but thinks that may be from how he is walking different. Just started back taking gabapentin in the last few days.    Pertinent History  HTN, left TKA 6/23/220, right THA 01/19/2019, Ventral Hernia repair 2013, shoulder arthroscopy 10/06/2008    Limitations  Sitting;Standing;Walking;House hold activities    Patient Stated Goals  walk without walker, get back to normal    Currently in Pain?  Yes    Pain Score  --   No pain score provided   Pain Location  Knee    Pain Orientation  Left    Pain Descriptors / Indicators  Discomfort    Pain Type  Surgical pain    Pain Onset  1 to 4 weeks ago    Pain Frequency  Intermittent         [facility] PT Assessment - 06/08/19 0001      Assessment   Medical Diagnosis  Presence of left artificial knee joint    Referring Provider (PT)  MParalee Cancel MD    Onset Date/Surgical Date  05/25/19    Next MD Visit  06/11/2019    Prior Therapy  no      Precautions   Precautions  Other (comment)    Precaution Comments  no ultrasound      Restrictions   Weight Bearing Restrictions  No      ROM / Strength   AROM / PROM / Strength  AROM      AROM   Overall AROM   Deficits;Within functional limits for tasks performed    AROM Assessment Site  Knee    Right/Left Knee  Left    Left Knee Extension  17    Left Knee Flexion  113                    [facility] Adult PT Treatment/Exercise - 06/08/19 0001      Ambulation/Gait   Assistive device  Straight cane    Gait Pattern  Step-through pattern;Left flexed knee in stance;Left steppage;Antalgic  Exercises   Exercises  Knee/Hip      Knee/Hip Exercises: Aerobic   Nustep  L4, seat 13 x12 min      Knee/Hip Exercises: Standing   Forward Lunges  Left;20 reps;2 seconds    Hip Abduction  AROM;Left;20 reps;Knee straight    Rocker Board  2 minutes      Knee/Hip Exercises: Supine   Short Arc Quad Sets  AROM;Left;15 reps      Modalities   Modalities  Vasopneumatic      Vasopneumatic   Number Minutes Vasopneumatic   15 minutes    Vasopnuematic Location   Knee    Vasopneumatic Pressure  Medium    Vasopneumatic Temperature   34               PT Short Term Goals - 06/08/19 1143      PT SHORT TERM GOAL #1   Title  Patient will be independent with HEP    Baseline  no knowledge of exercises    Time  3    Period  Weeks    Status  Partially Met      PT SHORT TERM GOAL #2   Title  Patient will demonstrate 16 degrees or less of left knee extension AROM to improve gait mechanics.    Baseline  left knee extension AROM 20 degrees from neutral    Time  3    Period  Weeks    Status  On-going        PT Long Term Goals - 06/08/19 1143      PT LONG TERM GOAL #1   Title  Patient will be independent with advanced HEP    Baseline  no knowledge of exercises    Time  6    Period  Weeks    Status  On-going      PT LONG TERM GOAL #2   Title  Patient will demonstrate 115+ degrees of left knee flexion AROM to improve mobility during functional tasks.    Baseline  20-100 degrees of left knee AROM.    Time  6    Period  Weeks    Status  On-going      PT LONG TERM GOAL #3   Title  Patient will demonstrate 8 degrees or less of left knee extension AROM to improve gait mechanics    Baseline  20-100 degrees of left knee AROM    Time  6    Period  Weeks     Status  On-going      PT LONG TERM GOAL #4   Title  Patient will report ability to perform ADLs with left knee pain less than or equal to 4/10    Baseline  9/10 with ADLs    Time  6    Period  Weeks    Status  On-going      PT LONG TERM GOAL #5   Title  Patient will demonstrate 4+/5 or greater left knee AROM to improve stability during functional tasks.    Baseline  not assessed secondary to precaution    Time  6    Period  Weeks    Status  On-going            Plan - 06/08/19 1144    Clinical Impression Statement  Patient presented in clinic today with aquacell dressing over L knee incision and ambulating with SPC. Patient's L knee observed with considerable L knee edema anteriorly surrounding patella. Minimally increased L  knee temperature palpable as well. Patient guided through initial low level ROM and stretching exercises with no complaints. Patient educated to find balance between activity and icing/elevation. To ice for 15-20 minutes every 3-4 hours with elevation to reduce edema and pain. AROM of L knee measured as 17-113 deg. Normal vasopneumatic response noted following removal of the modality.    Personal Factors and Comorbidities  Age;Comorbidity 1    Comorbidities  HTN    Examination-Activity Limitations  Bed Mobility;Stairs;Stand;Sleep    Examination-Participation Restrictions  Meal Prep;Driving;Yard Work    Stability/Clinical Decision Making  Stable/Uncomplicated    Rehab Potential  Good    PT Frequency  3x / week    PT Duration  6 weeks    PT Treatment/Interventions  ADLs/Self Care Home Management;Electrical Stimulation;Moist Heat;Balance training;Therapeutic exercise;Therapeutic activities;Functional mobility training;Stair training;Gait training;Neuromuscular re-education;Passive range of motion;Scar mobilization;Manual techniques;Taping;Vasopneumatic Device    PT Next Visit Plan  Nustep, standing knee AROM, gait training for step through gait pattern, left knee  PROM modalities PRN for pain relief    PT Home Exercise Plan  see patient education section    Consulted and Agree with Plan of Care  Patient       Patient will benefit from skilled therapeutic intervention in order to improve the following deficits and impairments:  Decreased activity tolerance, Decreased balance, Decreased range of motion, Decreased skin integrity, Decreased strength, Increased edema, Difficulty walking, Pain  Visit Diagnosis: 1. Acute pain of left knee   2. Stiffness of left knee, not elsewhere classified   3. Difficulty in walking, not elsewhere classified        Problem List Patient Active Problem List   Diagnosis Date Noted  . S/P left TKA 05/25/2019  . S/P hip replacement, right 01/21/2019  . Overweight (BMI 25.0-29.9) 01/20/2019  . S/P right THA, AA 01/19/2019  . Substance abuse ([place]) 05/24/2017  . Elevated troponin 05/24/2017  . Diarrhea 05/24/2017  . Sepsis ([place]) 05/23/2017  . Ventral hernia 11/10/2013  . Recurrent ventral incisional hernia 08/26/2013  . Disruption of wound, unspecified 03/01/2013  . Respiratory failure, post-operative ([place]) 10/28/2012  . Acute renal insufficiency, nonoliguric 10/28/2012  . Status post laparotomy, evac of hemoperitoneum and wound vac placement 11/26 10/28/2012  .  Ex lap with partial colectomy with revision of colon anastomosis, repair of ventral hernia 11/25 10/28/2012  . Encephalopathy acute 10/27/2012  . Abdominal compartment syndrome, post lap 11/26 10/27/2012  . Shock due to abdominal compartment syndrome and acute blood loss 10/27/2012  . Hemoperitoneum, resolved post lap 11/26 10/27/2012  . H/O Hypertension 01/06/2012  . Hypothyroid 01/06/2012    Standley Brooking, PTA 06/08/19 12:00 PM   [facility] [place], [place], [address] Phone: [phone]   Fax:  [phone]  Name: Douglas Wiggins MRN: [id number] Date of Birth: 1966/08/07

## 2019-06-14 ENCOUNTER — Ambulatory Visit: Payer: Medicaid Other | Admitting: Physical Therapy

## 2019-06-15 ENCOUNTER — Other Ambulatory Visit: Payer: Self-pay

## 2019-06-15 ENCOUNTER — Ambulatory Visit: Payer: Medicaid Other | Admitting: Physical Therapy

## 2019-06-15 ENCOUNTER — Encounter: Payer: Self-pay | Admitting: Physical Therapy

## 2019-06-15 DIAGNOSIS — M25562 Pain in left knee: Secondary | ICD-10-CM | POA: Diagnosis not present

## 2019-06-15 DIAGNOSIS — R262 Difficulty in walking, not elsewhere classified: Secondary | ICD-10-CM

## 2019-06-15 DIAGNOSIS — M25662 Stiffness of left knee, not elsewhere classified: Secondary | ICD-10-CM

## 2019-06-15 NOTE — Therapy (Signed)
[facility] [place], [place], [address] Phone: [phone]   Fax:  [phone]  Physical Therapy Treatment  Patient Details  Name: Douglas Wiggins MRN: [id number] Date of Birth: 1966/06/04 Referring Provider (PT): [name], MD   Encounter Date: 06/15/2019  PT End of Session - 06/15/19 1144    Visit Number  3    Number of Visits  18    Date for PT Re-Evaluation  07/26/19    Authorization Type  Medicaid    PT Start Time  1115    PT Stop Time  1208    PT Time Calculation (min)  53 min    Activity Tolerance  Patient tolerated treatment well    Behavior During Therapy  [facility] for tasks assessed/performed       Past Medical History:  Diagnosis Date  . ADHD   . Chronic lower back pain   . Complication of anesthesia    Pt reports that he 'hiccups' 2 days following administration of anesthesia  . Depression   . Diverticulosis of colon   . GERD (gastroesophageal reflux disease)   . Gout    per pt last flare-up 2018  . Hiatal hernia   . History of closed head injury 1992   traumatic---  per pt in coma few days,  residual memory issues  . History of diverticulitis of colon    s/p  colon resection for sigmoid perforation 01-06-2012  . History of sepsis 05/2017  . Hyperlipidemia   . Hypertension   . Hypothyroidism   . Lichen planus   . Memory difficulty    seconday traumatic head injury 1992  . Migraines   . Numbness and tingling of right arm    due to  cervical nerve damage  . OA (osteoarthritis)    right hip,  back, knees, ankles, neck  . OSA (obstructive sleep apnea)    does not wear cpap/states MILD- no sleep study in years  . Sciatica, right side   . Wears glasses   . Wears partial dentures    upper     Past Surgical History:  Procedure Laterality Date  . ANKLE ARTHROSCOPY Right 05-20-2003   dr graves @[facility]   . APPLICATION OF WOUND VAC  10/29/2012   Procedure: APPLICATION OF WOUND VAC;  Surgeon: Imogene Burn. Georgette Dover, MD;  Location: [place] [facility];  Service: General;;  . CARPAL TUNNEL RELEASE Left 1990's  . COLON RESECTION  10/26/2012   Procedure: COLON RESECTION;  Surgeon: Imogene Burn. Georgette Dover, MD;  Location: [place] [facility];  Service: General;  Laterality: N/A;  Partial colon resection with anastomosis  . COLOSTOMY CLOSURE  04/13/2012   Procedure: COLOSTOMY CLOSURE;  Surgeon: Imogene Burn. Georgette Dover, MD;  [facility];  Service: General;  Laterality: N/A;  . COLOSTOMY REVISION  01/06/2012   Procedure: COLON RESECTION SIGMOID;  Surgeon: Imogene Burn. Georgette Dover, MD;  [facility];  Service: General;  Laterality: N/A;  with hartmans procedure  . HAND SURGERY Left 08-02-2009   dr Burney Gauze @[facility]    repair left fifth digit crush injury  . INSERTION OF MESH N/A 11/10/2013   Procedure: INSERTION OF MESH;  Surgeon: Imogene Burn. Georgette Dover, MD;  Location: [place];  Service: General;  Laterality: N/A;  . KNEE ARTHROSCOPY Right 1990's  . LAPAROTOMY  10/26/2012   Procedure: EXPLORATORY LAPAROTOMY;  Surgeon: Imogene Burn. Georgette Dover, MD;  Location: [place];  Service: General;  Laterality: N/A;  . LAPAROTOMY  10/27/2012   Procedure: EXPLORATORY LAPAROTOMY;  Surgeon:  Zenovia Jarred, MD;  Location: [place];  Service: General;  Laterality: N/A;  . LAPAROTOMY  10/29/2012   Procedure: EXPLORATORY LAPAROTOMY;  Surgeon: Imogene Burn. Georgette Dover, MD;  Location: [place];  Service: General;  Laterality: N/A;  exploratory laparotomy, partial closure of abdominal wound,    . SHOULDER ARTHROSCOPY Right 10-06-2008  dr supple  @[facility]   . TOTAL HIP ARTHROPLASTY Right 01/19/2019   Procedure: TOTAL HIP ARTHROPLASTY ANTERIOR APPROACH;  Surgeon: [name], MD;  [facility];  Service: Orthopedics;  Laterality: Right;  70 mins  . TOTAL KNEE ARTHROPLASTY Left 05/25/2019   Procedure: TOTAL KNEE ARTHROPLASTY;  Surgeon: [name], MD;  [facility];  Service: Orthopedics;  Laterality: Left;  30mns  . VENTRAL HERNIA REPAIR  10/26/2012   Procedure: HERNIA REPAIR VENTRAL ADULT;   Surgeon: MImogene Burn TGeorgette Dover MD;  [place]  Service: General;  Laterality: N/A;  Primary Repair Ventral Hernia  . VENTRAL HERNIA REPAIR N/A 11/10/2013   Procedure: LAPAROSCOPIC VENTRAL HERNIA ;  Surgeon: MImogene Burn TGeorgette Dover MD;  [place]  Service: General;  Laterality: N/A;    There were no vitals filed for this visit.  Subjective Assessment - 06/15/19 1127    Subjective  COVID 19 screening performed on patient upon arrival.    Pertinent History  HTN, left TKA 6/23/220, right THA 01/19/2019, Ventral Hernia repair 2013, shoulder arthroscopy 10/06/2008    Limitations  Sitting;Standing;Walking;House hold activities    Patient Stated Goals  walk without walker, get back to normal    Currently in Pain?  Yes    Pain Score  2     Pain Location  Knee    Pain Orientation  Left;Medial;Lateral    Pain Descriptors / Indicators  Discomfort    Pain Type  Surgical pain    Pain Onset  1 to 4 weeks ago    Pain Frequency  Intermittent         [facility] PT Assessment - 06/15/19 0001      Assessment   Medical Diagnosis  Presence of left artificial knee joint    Referring Provider (PT)  MParalee Cancel MD    Onset Date/Surgical Date  05/25/19    Next MD Visit  06/11/2019    Prior Therapy  no      Precautions   Precautions  Other (comment)    Precaution Comments  no ultrasound      Restrictions   Weight Bearing Restrictions  No                   [facility] Adult PT Treatment/Exercise - 06/15/19 0001      Knee/Hip Exercises: Aerobic   Nustep  L4, seat 13 x15 min      Knee/Hip Exercises: Standing   Forward Lunges  Left;10 reps;2 seconds    Rocker Board  3 minutes      Knee/Hip Exercises: Supine   Short Arc Quad Sets  Left;Other (comment)   VMS to L quad/VMO for neuro re-ed     Modalities   Modalities  EPsychologist, educationalLocation  L VMO/Quad    Electrical Stimulation Action  VMS with SAQ    Electrical  Stimulation Parameters  10/10, 50 pps, 2 sec, 300 usec x10 min    Electrical Stimulation Goals  Neuromuscular facilitation      Vasopneumatic   Number Minutes Vasopneumatic   15 minutes    Vasopnuematic Location  Knee    Vasopneumatic Pressure  Medium    Vasopneumatic Temperature   34               PT Short Term Goals - 06/08/19 1143      PT SHORT TERM GOAL #1   Title  Patient will be independent with HEP    Baseline  no knowledge of exercises    Time  3    Period  Weeks    Status  Partially Met      PT SHORT TERM GOAL #2   Title  Patient will demonstrate 16 degrees or less of left knee extension AROM to improve gait mechanics.    Baseline  left knee extension AROM 20 degrees from neutral    Time  3    Period  Weeks    Status  On-going        PT Long Term Goals - 06/08/19 1143      PT LONG TERM GOAL #1   Title  Patient will be independent with advanced HEP    Baseline  no knowledge of exercises    Time  6    Period  Weeks    Status  On-going      PT LONG TERM GOAL #2   Title  Patient will demonstrate 115+ degrees of left knee flexion AROM to improve mobility during functional tasks.    Baseline  20-100 degrees of left knee AROM.    Time  6    Period  Weeks    Status  On-going      PT LONG TERM GOAL #3   Title  Patient will demonstrate 8 degrees or less of left knee extension AROM to improve gait mechanics    Baseline  20-100 degrees of left knee AROM    Time  6    Period  Weeks    Status  On-going      PT LONG TERM GOAL #4   Title  Patient will report ability to perform ADLs with left knee pain less than or equal to 4/10    Baseline  9/10 with ADLs    Time  6    Period  Weeks    Status  On-going      PT LONG TERM GOAL #5   Title  Patient will demonstrate 4+/5 or greater left knee AROM to improve stability during functional tasks.    Baseline  not assessed secondary to precaution    Time  6    Period  Weeks    Status  On-going             Plan - 06/15/19 1211    Clinical Impression Statement  Patient presented in clinic with low level discomfort in L knee. No AD utilized by patient during ambulation. L knee flexion noted in stance while ambulating. Increased edema still present surrounding the anterior L knee. Good L knee flexion observed during therex. VMS initiated to L VMO/Quad for neuro re-ed of L quad due to lack of L knee extension and volitional contraction of quad. Normal modalities response noted following removal of the modalities. Patient encouraged to continue activity as well as icing as home.    Personal Factors and Comorbidities  Age;Comorbidity 1    Comorbidities  HTN    Examination-Activity Limitations  Bed Mobility;Stairs;Stand;Sleep    Examination-Participation Restrictions  Meal Prep;Driving;Yard Work    Stability/Clinical Decision Making  Stable/Uncomplicated    Rehab Potential  Good    PT  Frequency  3x / week    PT Duration  6 weeks    PT Treatment/Interventions  ADLs/Self Care Home Management;Electrical Stimulation;Moist Heat;Balance training;Therapeutic exercise;Therapeutic activities;Functional mobility training;Stair training;Gait training;Neuromuscular re-education;Passive range of motion;Scar mobilization;Manual techniques;Taping;Vasopneumatic Device    PT Next Visit Plan  Nustep, standing knee AROM, gait training for step through gait pattern, left knee PROM modalities PRN for pain relief    PT Home Exercise Plan  see patient education section    Consulted and Agree with Plan of Care  Patient       Patient will benefit from skilled therapeutic intervention in order to improve the following deficits and impairments:  Decreased activity tolerance, Decreased balance, Decreased range of motion, Decreased skin integrity, Decreased strength, Increased edema, Difficulty walking, Pain  Visit Diagnosis: 1. Acute pain of left knee   2. Stiffness of left knee, not elsewhere classified   3.  Difficulty in walking, not elsewhere classified        Problem List Patient Active Problem List   Diagnosis Date Noted  . S/P left TKA 05/25/2019  . S/P hip replacement, right 01/21/2019  . Overweight (BMI 25.0-29.9) 01/20/2019  . S/P right THA, AA 01/19/2019  . Substance abuse ([place]) 05/24/2017  . Elevated troponin 05/24/2017  . Diarrhea 05/24/2017  . Sepsis ([place]) 05/23/2017  . Ventral hernia 11/10/2013  . Recurrent ventral incisional hernia 08/26/2013  . Disruption of wound, unspecified 03/01/2013  . Respiratory failure, post-operative ([place]) 10/28/2012  . Acute renal insufficiency, nonoliguric 10/28/2012  . Status post laparotomy, evac of hemoperitoneum and wound vac placement 11/26 10/28/2012  .  Ex lap with partial colectomy with revision of colon anastomosis, repair of ventral hernia 11/25 10/28/2012  . Encephalopathy acute 10/27/2012  . Abdominal compartment syndrome, post lap 11/26 10/27/2012  . Shock due to abdominal compartment syndrome and acute blood loss 10/27/2012  . Hemoperitoneum, resolved post lap 11/26 10/27/2012  . H/O Hypertension 01/06/2012  . Hypothyroid 01/06/2012    Standley Brooking, PTA 06/15/2019, 12:20 PM  [facility] [address] [place], [place], [address] Phone: [phone]   Fax:  [phone]  Name: Douglas Wiggins MRN: [id number] Date of Birth: 1966-11-29

## 2019-06-17 ENCOUNTER — Encounter: Payer: Self-pay | Admitting: Physical Therapy

## 2019-06-17 ENCOUNTER — Ambulatory Visit: Payer: Medicaid Other | Admitting: Physical Therapy

## 2019-06-17 ENCOUNTER — Other Ambulatory Visit: Payer: Self-pay

## 2019-06-17 DIAGNOSIS — R262 Difficulty in walking, not elsewhere classified: Secondary | ICD-10-CM

## 2019-06-17 DIAGNOSIS — M25662 Stiffness of left knee, not elsewhere classified: Secondary | ICD-10-CM

## 2019-06-17 DIAGNOSIS — M25562 Pain in left knee: Secondary | ICD-10-CM | POA: Diagnosis not present

## 2019-06-17 NOTE — Therapy (Signed)
[facility] [place], [place], [address] Phone: [phone]   Fax:  [phone]  Physical Therapy Treatment  Patient Details  Name: MUAAZ BRAU MRN: [id number] Date of Birth: 27-Aug-1966 Referring Provider (PT): [name], MD   Encounter Date: 06/17/2019  PT End of Session - 06/17/19 0931    Visit Number  4    Number of Visits  18    Date for PT Re-Evaluation  07/26/19    Authorization Type  Medicaid    PT Start Time  0907    PT Stop Time  0946    PT Time Calculation (min)  39 min    Activity Tolerance  Patient tolerated treatment well    Behavior During Therapy  [facility] for tasks assessed/performed       Past Medical History:  Diagnosis Date  . ADHD   . Chronic lower back pain   . Complication of anesthesia    Pt reports that he 'hiccups' 2 days following administration of anesthesia  . Depression   . Diverticulosis of colon   . GERD (gastroesophageal reflux disease)   . Gout    per pt last flare-up 2018  . Hiatal hernia   . History of closed head injury 1992   traumatic---  per pt in coma few days,  residual memory issues  . History of diverticulitis of colon    s/p  colon resection for sigmoid perforation 01-06-2012  . History of sepsis 05/2017  . Hyperlipidemia   . Hypertension   . Hypothyroidism   . Lichen planus   . Memory difficulty    seconday traumatic head injury 1992  . Migraines   . Numbness and tingling of right arm    due to  cervical nerve damage  . OA (osteoarthritis)    right hip,  back, knees, ankles, neck  . OSA (obstructive sleep apnea)    does not wear cpap/states MILD- no sleep study in years  . Sciatica, right side   . Wears glasses   . Wears partial dentures    upper     Past Surgical History:  Procedure Laterality Date  . ANKLE ARTHROSCOPY Right 05-20-2003   dr graves @[facility]   . APPLICATION OF WOUND VAC  10/29/2012   Procedure: APPLICATION OF WOUND VAC;  Surgeon: Imogene Burn. Georgette Dover, MD;  Location: [place] [facility];  Service: General;;  . CARPAL TUNNEL RELEASE Left 1990's  . COLON RESECTION  10/26/2012   Procedure: COLON RESECTION;  Surgeon: Imogene Burn. Georgette Dover, MD;  Location: [place] [facility];  Service: General;  Laterality: N/A;  Partial colon resection with anastomosis  . COLOSTOMY CLOSURE  04/13/2012   Procedure: COLOSTOMY CLOSURE;  Surgeon: Imogene Burn. Georgette Dover, MD;  [facility];  Service: General;  Laterality: N/A;  . COLOSTOMY REVISION  01/06/2012   Procedure: COLON RESECTION SIGMOID;  Surgeon: Imogene Burn. Georgette Dover, MD;  [facility];  Service: General;  Laterality: N/A;  with hartmans procedure  . HAND SURGERY Left 08-02-2009   dr Burney Gauze @[facility]    repair left fifth digit crush injury  . INSERTION OF MESH N/A 11/10/2013   Procedure: INSERTION OF MESH;  Surgeon: Imogene Burn. Georgette Dover, MD;  Location: [place];  Service: General;  Laterality: N/A;  . KNEE ARTHROSCOPY Right 1990's  . LAPAROTOMY  10/26/2012   Procedure: EXPLORATORY LAPAROTOMY;  Surgeon: Imogene Burn. Georgette Dover, MD;  Location: [place];  Service: General;  Laterality: N/A;  . LAPAROTOMY  10/27/2012   Procedure: EXPLORATORY LAPAROTOMY;  Surgeon:  Zenovia Jarred, MD;  Location: [place];  Service: General;  Laterality: N/A;  . LAPAROTOMY  10/29/2012   Procedure: EXPLORATORY LAPAROTOMY;  Surgeon: Imogene Burn. Georgette Dover, MD;  Location: [place];  Service: General;  Laterality: N/A;  exploratory laparotomy, partial closure of abdominal wound,    . SHOULDER ARTHROSCOPY Right 10-06-2008  dr supple  @[facility]   . TOTAL HIP ARTHROPLASTY Right 01/19/2019   Procedure: TOTAL HIP ARTHROPLASTY ANTERIOR APPROACH;  Surgeon: [name], MD;  [facility];  Service: Orthopedics;  Laterality: Right;  70 mins  . TOTAL KNEE ARTHROPLASTY Left 05/25/2019   Procedure: TOTAL KNEE ARTHROPLASTY;  Surgeon: [name], MD;  [facility];  Service: Orthopedics;  Laterality: Left;  18mns  . VENTRAL HERNIA REPAIR  10/26/2012   Procedure: HERNIA REPAIR VENTRAL ADULT;   Surgeon: MImogene Burn TGeorgette Dover MD;  [place]  Service: General;  Laterality: N/A;  Primary Repair Ventral Hernia  . VENTRAL HERNIA REPAIR N/A 11/10/2013   Procedure: LAPAROSCOPIC VENTRAL HERNIA ;  Surgeon: MImogene Burn TGeorgette Dover MD;  [place]  Service: General;  Laterality: N/A;    There were no vitals filed for this visit.  Subjective Assessment - 06/17/19 0926    Subjective  COVID 19 screening performed on patient upon arrival. Patient reports that he didn't sleep much last night. Reports that he goes and does whatever he needs to but doesn't have much pain.    Pertinent History  HTN, left TKA 6/23/220, right THA 01/19/2019, Ventral Hernia repair 2013, shoulder arthroscopy 10/06/2008    Limitations  Sitting;Standing;Walking;House hold activities    Patient Stated Goals  walk without walker, get back to normal    Currently in Pain?  No/denies         [facility] - [facility]      Assessment   Medical Diagnosis  Presence of left artificial knee joint    Referring Provider (PT)  MParalee Cancel MD    Onset Date/Surgical Date  05/25/19    Prior Therapy  no      Precautions   Precautions  Other (comment)    Precaution Comments  no ultrasound      Restrictions   Weight Bearing Restrictions  No                   [facility] Adult PT Treatment/Exercise - 06/17/19 0001      Knee/Hip Exercises: Aerobic   Nustep  L4, seat 12 x10 min LEs only      Knee/Hip Exercises: Supine   Short Arc Quad Sets  Left;Other (comment)   VMS with SAQ     Modalities   Modalities  ETeacher, English as a foreign languageLocation  L VMO/Quad    Electrical Stimulation Action  VMS with SAQ    Electrical Stimulation Parameters  10/10, 50 pps, 2 sec, 300 usec x10 min    Electrical Stimulation Goals  Neuromuscular facilitation      Manual Therapy   Manual Therapy  Taping    Kinesiotex  Edema      Kinesiotix   Edema  (2) strips of spider  taping placed on L knee to reduce edema               PT Short Term Goals - 06/17/19 02992     PT SHORT TERM GOAL #1   Title  Patient will be independent with HEP    Baseline  no knowledge  of exercises    Time  3    Period  Weeks    Status  Partially Met      PT SHORT TERM GOAL #2   Title  Patient will demonstrate 16 degrees or less of left knee extension AROM to improve gait mechanics.    Baseline  left knee extension AROM 20 degrees from neutral    Time  3    Period  Weeks    Status  Achieved        PT Long Term Goals - 06/17/19 0920      PT LONG TERM GOAL #1   Title  Patient will be independent with advanced HEP    Baseline  no knowledge of exercises    Time  6    Period  Weeks    Status  On-going      PT LONG TERM GOAL #2   Title  Patient will demonstrate 115+ degrees of left knee flexion AROM to improve mobility during functional tasks.    Baseline  20-100 degrees of left knee AROM.    Time  6    Period  Weeks    Status  Achieved      PT LONG TERM GOAL #3   Title  Patient will demonstrate 8 degrees or less of left knee extension AROM to improve gait mechanics    Baseline  20-100 degrees of left knee AROM    Time  6    Period  Weeks    Status  On-going      PT LONG TERM GOAL #4   Title  Patient will report ability to perform ADLs with left knee pain less than or equal to 4/10    Baseline  9/10 with ADLs    Time  6    Period  Weeks    Status  Achieved      PT LONG TERM GOAL #5   Title  Patient will demonstrate 4+/5 or greater left knee AROM to improve stability during functional tasks.    Baseline  not assessed secondary to precaution    Time  6    Period  Weeks    Status  On-going            Plan - 06/17/19 0931    Clinical Impression Statement  Patient presented in clinic with no current L knee pain. Patient able to make progress with L knee ROM. Increased edema present over L knee especially along lateral knee. VMS continued to L VMO  and quad and volitional contraction deficient. Normal modalities response noted following removal of the modality. Spider taping to reduce edema placed in two seperate strips over anterior L knee. Patient verbally educated regarding maitenance and rationale for the tape such as patting dry after showering.    Personal Factors and Comorbidities  Age;Comorbidity 1    Comorbidities  HTN    Examination-Activity Limitations  Bed Mobility;Stairs;Stand;Sleep    Examination-Participation Restrictions  Meal Prep;Driving;Yard Work    Stability/Clinical Decision Making  Stable/Uncomplicated    Rehab Potential  Good    PT Frequency  3x / week    PT Duration  6 weeks    PT Treatment/Interventions  ADLs/Self Care Home Management;Electrical Stimulation;Moist Heat;Balance training;Therapeutic exercise;Therapeutic activities;Functional mobility training;Stair training;Gait training;Neuromuscular re-education;Passive range of motion;Scar mobilization;Manual techniques;Taping;Vasopneumatic Device    PT Next Visit Plan  Monitor edema as well as L quad contraction per POC.    PT Home Exercise Plan  see patient education section  Consulted and Agree with Plan of Care  Patient       Patient will benefit from skilled therapeutic intervention in order to improve the following deficits and impairments:  Decreased activity tolerance, Decreased balance, Decreased range of motion, Decreased skin integrity, Decreased strength, Increased edema, Difficulty walking, Pain  Visit Diagnosis: 1. Acute pain of left knee   2. Stiffness of left knee, not elsewhere classified   3. Difficulty in walking, not elsewhere classified        Problem List Patient Active Problem List   Diagnosis Date Noted  . S/P left TKA 05/25/2019  . S/P hip replacement, right 01/21/2019  . Overweight (BMI 25.0-29.9) 01/20/2019  . S/P right THA, AA 01/19/2019  . Substance abuse ([place]) 05/24/2017  . Elevated troponin 05/24/2017  . Diarrhea  05/24/2017  . Sepsis ([place]) 05/23/2017  . Ventral hernia 11/10/2013  . Recurrent ventral incisional hernia 08/26/2013  . Disruption of wound, unspecified 03/01/2013  . Respiratory failure, post-operative ([place]) 10/28/2012  . Acute renal insufficiency, nonoliguric 10/28/2012  . Status post laparotomy, evac of hemoperitoneum and wound vac placement 11/26 10/28/2012  .  Ex lap with partial colectomy with revision of colon anastomosis, repair of ventral hernia 11/25 10/28/2012  . Encephalopathy acute 10/27/2012  . Abdominal compartment syndrome, post lap 11/26 10/27/2012  . Shock due to abdominal compartment syndrome and acute blood loss 10/27/2012  . Hemoperitoneum, resolved post lap 11/26 10/27/2012  . H/O Hypertension 01/06/2012  . Hypothyroid 01/06/2012    Standley Brooking, PTA 06/17/2019, 10:50 AM  [facility] [address] [place], [place], [address] Phone: [phone]   Fax:  [phone]  Name: RUHAN BORAK MRN: [id number] Date of Birth: [birth year]

## 2019-06-24 ENCOUNTER — Encounter: Payer: Self-pay | Admitting: Physical Therapy

## 2019-06-24 ENCOUNTER — Other Ambulatory Visit: Payer: Self-pay

## 2019-06-24 ENCOUNTER — Ambulatory Visit: Payer: Medicaid Other | Admitting: Physical Therapy

## 2019-06-24 DIAGNOSIS — M25662 Stiffness of left knee, not elsewhere classified: Secondary | ICD-10-CM

## 2019-06-24 DIAGNOSIS — R262 Difficulty in walking, not elsewhere classified: Secondary | ICD-10-CM

## 2019-06-24 DIAGNOSIS — M25562 Pain in left knee: Secondary | ICD-10-CM | POA: Diagnosis not present

## 2019-06-24 NOTE — Therapy (Signed)
[facility] [place], [place], [address] Phone: [phone]   Fax:  [phone]  Physical Therapy Treatment  Patient Details  Name: Douglas Wiggins MRN: [id number] Date of Birth: 1966-02-17 Referring Provider (PT): [name], MD   Encounter Date: 06/24/2019  PT End of Session - 06/24/19 1032    Visit Number  5    Number of Visits  18    Date for PT Re-Evaluation  07/26/19    Authorization Type  Medicaid    PT Start Time  0946    PT Stop Time  1045    PT Time Calculation (min)  59 min    Activity Tolerance  Patient tolerated treatment well    Behavior During Therapy  [facility] for tasks assessed/performed       Past Medical History:  Diagnosis Date  . ADHD   . Chronic lower back pain   . Complication of anesthesia    Pt reports that he 'hiccups' 2 days following administration of anesthesia  . Depression   . Diverticulosis of colon   . GERD (gastroesophageal reflux disease)   . Gout    per pt last flare-up 2018  . Hiatal hernia   . History of closed head injury 1992   traumatic---  per pt in coma few days,  residual memory issues  . History of diverticulitis of colon    s/p  colon resection for sigmoid perforation 01-06-2012  . History of sepsis 05/2017  . Hyperlipidemia   . Hypertension   . Hypothyroidism   . Lichen planus   . Memory difficulty    seconday traumatic head injury 1992  . Migraines   . Numbness and tingling of right arm    due to  cervical nerve damage  . OA (osteoarthritis)    right hip,  back, knees, ankles, neck  . OSA (obstructive sleep apnea)    does not wear cpap/states MILD- no sleep study in years  . Sciatica, right side   . Wears glasses   . Wears partial dentures    upper     Past Surgical History:  Procedure Laterality Date  . ANKLE ARTHROSCOPY Right 05-20-2003   dr graves @[facility]   . APPLICATION OF WOUND VAC  10/29/2012   Procedure: APPLICATION OF WOUND VAC;  Surgeon: Imogene Burn. Georgette Dover, MD;  Location: [place] [facility];  Service: General;;  . CARPAL TUNNEL RELEASE Left 1990's  . COLON RESECTION  10/26/2012   Procedure: COLON RESECTION;  Surgeon: Imogene Burn. Georgette Dover, MD;  Location: [place] [facility];  Service: General;  Laterality: N/A;  Partial colon resection with anastomosis  . COLOSTOMY CLOSURE  04/13/2012   Procedure: COLOSTOMY CLOSURE;  Surgeon: Imogene Burn. Georgette Dover, MD;  [facility];  Service: General;  Laterality: N/A;  . COLOSTOMY REVISION  01/06/2012   Procedure: COLON RESECTION SIGMOID;  Surgeon: Imogene Burn. Georgette Dover, MD;  [facility];  Service: General;  Laterality: N/A;  with hartmans procedure  . HAND SURGERY Left 08-02-2009   dr Burney Gauze @[facility]    repair left fifth digit crush injury  . INSERTION OF MESH N/A 11/10/2013   Procedure: INSERTION OF MESH;  Surgeon: Imogene Burn. Georgette Dover, MD;  Location: [place];  Service: General;  Laterality: N/A;  . KNEE ARTHROSCOPY Right 1990's  . LAPAROTOMY  10/26/2012   Procedure: EXPLORATORY LAPAROTOMY;  Surgeon: Imogene Burn. Georgette Dover, MD;  Location: [place];  Service: General;  Laterality: N/A;  . LAPAROTOMY  10/27/2012   Procedure: EXPLORATORY LAPAROTOMY;  Surgeon:  Zenovia Jarred, MD;  Location: [place];  Service: General;  Laterality: N/A;  . LAPAROTOMY  10/29/2012   Procedure: EXPLORATORY LAPAROTOMY;  Surgeon: Imogene Burn. Georgette Dover, MD;  Location: [place];  Service: General;  Laterality: N/A;  exploratory laparotomy, partial closure of abdominal wound,    . SHOULDER ARTHROSCOPY Right 10-06-2008  dr supple  @[facility]   . TOTAL HIP ARTHROPLASTY Right 01/19/2019   Procedure: TOTAL HIP ARTHROPLASTY ANTERIOR APPROACH;  Surgeon: [name], MD;  [facility];  Service: Orthopedics;  Laterality: Right;  70 mins  . TOTAL KNEE ARTHROPLASTY Left 05/25/2019   Procedure: TOTAL KNEE ARTHROPLASTY;  Surgeon: [name], MD;  [facility];  Service: Orthopedics;  Laterality: Left;  63mns  . VENTRAL HERNIA REPAIR  10/26/2012   Procedure: HERNIA REPAIR VENTRAL ADULT;   Surgeon: MImogene Burn TGeorgette Dover MD;  [place]  Service: General;  Laterality: N/A;  Primary Repair Ventral Hernia  . VENTRAL HERNIA REPAIR N/A 11/10/2013   Procedure: LAPAROSCOPIC VENTRAL HERNIA ;  Surgeon: MImogene Burn TGeorgette Dover MD;  [place]  Service: General;  Laterality: N/A;    There were no vitals filed for this visit.  Subjective Assessment - 06/24/19 1010    Subjective  COVID 19 screening performed on patient upon arrival. Patient reported doing well just ongoing stiffness    Pertinent History  HTN, left TKA 6/23/220, right THA 01/19/2019, Ventral Hernia repair 2013, shoulder arthroscopy 10/06/2008    Limitations  Sitting;Standing;Walking;House hold activities    Patient Stated Goals  walk without walker, get back to normal    Currently in Pain?  No/denies         [facility]      AROM   AROM Assessment Site  Knee    Right/Left Knee  Left    Left Knee Extension  -16    Left Knee Flexion  115      PROM   PROM Assessment Site  Knee    Right/Left Knee  Left    Left Knee Extension  -11    Left Knee Flexion  120                   [facility] Adult PT Treatment/Exercise - 06/24/19 0001      Knee/Hip Exercises: Aerobic   Nustep  L4, seat 13 x10 min LEs only      Knee/Hip Exercises: Standing   Rocker Board  3 minutes      Knee/Hip Exercises: Prone   Other Prone Exercises  prone hang x 56m      Electrical Stimulation   Electrical Stimulation Location  L VMO/Quad    Electrical Stimulation Action  VMS with SAQ 10/10 x 1044m   Electrical Stimulation Goals  Neuromuscular facilitation      Vasopneumatic   Number Minutes Vasopneumatic   10 minutes    Vasopnuematic Location   Knee    Vasopneumatic Pressure  Medium      Manual Therapy   Manual Therapy  Passive ROM;Taping    Manual therapy comments  manual stretching with overpressure holds to improve ROM, for left knee ext    Kinesiotex  Edema      Kinesiotix   Edema  (2) strips of  spider taping placed on L knee to reduce edema               PT Short Term Goals - 06/24/19 1042      PT SHORT TERM GOAL #1  Title  Patient will be independent with HEP    Baseline  no knowledge of exercises    Time  3    Period  Weeks    Status  Partially Met      PT SHORT TERM GOAL #2   Title  Patient will demonstrate 16 degrees or less of left knee extension AROM to improve gait mechanics.    Time  3    Period  Weeks    Status  Achieved        PT Long Term Goals - 06/24/19 1043      PT LONG TERM GOAL #1   Title  Patient will be independent with advanced HEP    Baseline  no knowledge of exercises    Time  6    Period  Weeks    Status  On-going      PT LONG TERM GOAL #2   Title  Patient will demonstrate 115+ degrees of left knee flexion AROM to improve mobility during functional tasks.    Baseline  20-100 degrees of left knee AROM.    Time  6    Period  Weeks    Status  Achieved      PT LONG TERM GOAL #3   Title  Patient will demonstrate 8 degrees or less of left knee extension AROM to improve gait mechanics    Baseline  20-100 degrees of left knee AROM    Time  6    Period  Weeks    Status  On-going   AROM -11 degrees 06/24/19     PT LONG TERM GOAL #4   Title  Patient will report ability to perform ADLs with left knee pain less than or equal to 4/10    Baseline  9/10 with ADLs    Time  6    Period  Weeks    Status  Achieved      PT LONG TERM GOAL #5   Title  Patient will demonstrate 4+/5 or greater left knee AROM to improve stability during functional tasks.    Baseline  not assessed secondary to precaution    Period  Weeks    Status  On-going            Plan - 06/24/19 1040    Clinical Impression Statement  Patient tolerated treatment well today. Patient reported no pain only soreness at times when stretching knee. Patient ROM for left knee ext improved after manual stretching today. Patient continues to have limitations with ext ROM and  strength deficts. Goals ongoing.    Personal Factors and Comorbidities  Age;Comorbidity 1    Comorbidities  HTN    Examination-Activity Limitations  Bed Mobility;Stairs;Stand;Sleep    Examination-Participation Restrictions  Meal Prep;Driving;Yard Work    Stability/Clinical Decision Making  Stable/Uncomplicated    Rehab Potential  Good    PT Frequency  3x / week    PT Duration  6 weeks    PT Treatment/Interventions  ADLs/Self Care Home Management;Electrical Stimulation;Moist Heat;Balance training;Therapeutic exercise;Therapeutic activities;Functional mobility training;Stair training;Gait training;Neuromuscular re-education;Passive range of motion;Scar mobilization;Manual techniques;Taping;Vasopneumatic Device    PT Next Visit Plan  Cont with POC for left knee ext ROM and strengthening/ VASO and taping for edema PRN    Consulted and Agree with Plan of Care  Patient       Patient will benefit from skilled therapeutic intervention in order to improve the following deficits and impairments:  Decreased activity tolerance, Decreased balance, Decreased range of motion, Decreased  skin integrity, Decreased strength, Increased edema, Difficulty walking, Pain  Visit Diagnosis: 1. Acute pain of left knee   2. Stiffness of left knee, not elsewhere classified   3. Difficulty in walking, not elsewhere classified        Problem List Patient Active Problem List   Diagnosis Date Noted  . S/P left TKA 05/25/2019  . S/P hip replacement, right 01/21/2019  . Overweight (BMI 25.0-29.9) 01/20/2019  . S/P right THA, AA 01/19/2019  . Substance abuse ([place]) 05/24/2017  . Elevated troponin 05/24/2017  . Diarrhea 05/24/2017  . Sepsis ([place]) 05/23/2017  . Ventral hernia 11/10/2013  . Recurrent ventral incisional hernia 08/26/2013  . Disruption of wound, unspecified 03/01/2013  . Respiratory failure, post-operative ([place]) 10/28/2012  . Acute renal insufficiency, nonoliguric 10/28/2012  . Status post  laparotomy, evac of hemoperitoneum and wound vac placement 11/26 10/28/2012  .  Ex lap with partial colectomy with revision of colon anastomosis, repair of ventral hernia 11/25 10/28/2012  . Encephalopathy acute 10/27/2012  . Abdominal compartment syndrome, post lap 11/26 10/27/2012  . Shock due to abdominal compartment syndrome and acute blood loss 10/27/2012  . Hemoperitoneum, resolved post lap 11/26 10/27/2012  . H/O Hypertension 01/06/2012  . Hypothyroid 01/06/2012    Phillips Climes, PTA 06/24/2019, 10:55 AM  [facility] [place], [place], [address] Phone: [phone]   Fax:  [phone]  Name: Douglas Wiggins MRN: [id number] Date of Birth: 1966-09-27

## 2019-06-28 ENCOUNTER — Encounter: Payer: Self-pay | Admitting: Physical Therapy

## 2019-06-28 ENCOUNTER — Other Ambulatory Visit: Payer: Self-pay

## 2019-06-28 ENCOUNTER — Ambulatory Visit: Payer: Medicaid Other | Admitting: Physical Therapy

## 2019-06-28 DIAGNOSIS — M25662 Stiffness of left knee, not elsewhere classified: Secondary | ICD-10-CM

## 2019-06-28 DIAGNOSIS — M25562 Pain in left knee: Secondary | ICD-10-CM

## 2019-06-28 DIAGNOSIS — R262 Difficulty in walking, not elsewhere classified: Secondary | ICD-10-CM

## 2019-06-28 NOTE — Therapy (Signed)
[facility] [place], [place], [address] Phone: [phone]   Fax:  [phone]  Physical Therapy Treatment  Patient Details  Name: Douglas Wiggins MRN: [id number] Date of Birth: 1966-02-24 Referring Provider (PT): [name], MD   Encounter Date: 06/28/2019  PT End of Session - 06/28/19 0948    Visit Number  6    Number of Visits  18    Date for PT Re-Evaluation  07/26/19    Authorization Type  Medicaid    PT Start Time  2542    PT Stop Time  1040    PT Time Calculation (min)  53 min    Activity Tolerance  Patient tolerated treatment well    Behavior During Therapy  [facility] for tasks assessed/performed       Past Medical History:  Diagnosis Date  . ADHD   . Chronic lower back pain   . Complication of anesthesia    Pt reports that he 'hiccups' 2 days following administration of anesthesia  . Depression   . Diverticulosis of colon   . GERD (gastroesophageal reflux disease)   . Gout    per pt last flare-up 2018  . Hiatal hernia   . History of closed head injury 1992   traumatic---  per pt in coma few days,  residual memory issues  . History of diverticulitis of colon    s/p  colon resection for sigmoid perforation 01-06-2012  . History of sepsis 05/2017  . Hyperlipidemia   . Hypertension   . Hypothyroidism   . Lichen planus   . Memory difficulty    seconday traumatic head injury 1992  . Migraines   . Numbness and tingling of right arm    due to  cervical nerve damage  . OA (osteoarthritis)    right hip,  back, knees, ankles, neck  . OSA (obstructive sleep apnea)    does not wear cpap/states MILD- no sleep study in years  . Sciatica, right side   . Wears glasses   . Wears partial dentures    upper     Past Surgical History:  Procedure Laterality Date  . ANKLE ARTHROSCOPY Right 05-20-2003   dr graves _0   . APPLICATION OF WOUND VAC  10/29/2012   Procedure: APPLICATION OF WOUND VAC;  Surgeon: Imogene Burn. Georgette Dover, MD;  Location: [place] [facility];  Service: General;;  . CARPAL TUNNEL RELEASE Left 1990's  . COLON RESECTION  10/26/2012   Procedure: COLON RESECTION;  Surgeon: Imogene Burn. Georgette Dover, MD;  Location: [place] [facility];  Service: General;  Laterality: N/A;  Partial colon resection with anastomosis  . COLOSTOMY CLOSURE  04/13/2012   Procedure: COLOSTOMY CLOSURE;  Surgeon: Imogene Burn. Georgette Dover, MD;  [facility];  Service: General;  Laterality: N/A;  . COLOSTOMY REVISION  01/06/2012   Procedure: COLON RESECTION SIGMOID;  Surgeon: Imogene Burn. Georgette Dover, MD;  [facility];  Service: General;  Laterality: N/A;  with hartmans procedure  . HAND SURGERY Left 08-02-2009   dr Burney Gauze _1    repair left fifth digit crush injury  . INSERTION OF MESH N/A 11/10/2013   Procedure: INSERTION OF MESH;  Surgeon: Imogene Burn. Georgette Dover, MD;  Location: [place];  Service: General;  Laterality: N/A;  . KNEE ARTHROSCOPY Right 1990's  . LAPAROTOMY  10/26/2012   Procedure: EXPLORATORY LAPAROTOMY;  Surgeon: Imogene Burn. Georgette Dover, MD;  Location: [place];  Service: General;  Laterality: N/A;  . LAPAROTOMY  10/27/2012   Procedure: EXPLORATORY LAPAROTOMY;  Surgeon:  Zenovia Jarred, MD;  Location: [place];  Service: General;  Laterality: N/A;  . LAPAROTOMY  10/29/2012   Procedure: EXPLORATORY LAPAROTOMY;  Surgeon: Imogene Burn. Georgette Dover, MD;  Location: [place];  Service: General;  Laterality: N/A;  exploratory laparotomy, partial closure of abdominal wound,    . SHOULDER ARTHROSCOPY Right 10-06-2008  dr supple  _0   . TOTAL HIP ARTHROPLASTY Right 01/19/2019   Procedure: TOTAL HIP ARTHROPLASTY ANTERIOR APPROACH;  Surgeon: [name], MD;  [facility];  Service: Orthopedics;  Laterality: Right;  70 mins  . TOTAL KNEE ARTHROPLASTY Left 05/25/2019   Procedure: TOTAL KNEE ARTHROPLASTY;  Surgeon: [name], MD;  [facility];  Service: Orthopedics;  Laterality: Left;  102mns  . VENTRAL HERNIA REPAIR  10/26/2012   Procedure: HERNIA REPAIR VENTRAL ADULT;   Surgeon: MImogene Burn TGeorgette Dover MD;  [place]  Service: General;  Laterality: N/A;  Primary Repair Ventral Hernia  . VENTRAL HERNIA REPAIR N/A 11/10/2013   Procedure: LAPAROSCOPIC VENTRAL HERNIA ;  Surgeon: MImogene Burn TGeorgette Dover MD;  [place]  Service: General;  Laterality: N/A;    There were no vitals filed for this visit.  Subjective Assessment - 06/28/19 0948    Subjective  COVID 19 screening performed on patient upon arrival. Patient reported doing well just having intermittant sharp pain in medial L knee.    Pertinent History  HTN, left TKA 6/23/220, right THA 01/19/2019, Ventral Hernia repair 2013, shoulder arthroscopy 10/06/2008    Limitations  Sitting;Standing;Walking;House hold activities    Patient Stated Goals  walk without walker, get back to normal    Currently in Pain?  No/denies         [facility]      Assessment   Medical Diagnosis  Presence of left artificial knee joint    Referring Provider (PT)  MParalee Cancel MD    Onset Date/Surgical Date  05/25/19    Next MD Visit  07/09/2019    Prior Therapy  no      Precautions   Precautions  Other (comment)    Precaution Comments  no ultrasound      Restrictions   Weight Bearing Restrictions  No      ROM / Strength   AROM / PROM / Strength  AROM      AROM   Overall AROM   Deficits    AROM Assessment Site  Knee    Right/Left Knee  Left    Left Knee Extension  11                   [facility] Adult PT Treatment/Exercise - 06/28/19 0001      Knee/Hip Exercises: Aerobic   Recumbent Bike  L4, seat 12 x13 min      Knee/Hip Exercises: Standing   Rocker Board  3 minutes      Knee/Hip Exercises: Prone   Other Prone Exercises  prone hang x 526m 2#      Modalities   Modalities  Electrical Stimulation;Vasopneumatic      Electrical Stimulation   Electrical Stimulation Location  L VMO/Quad    Electrical Stimulation Action  VMS with SAQ    Electrical Stimulation Parameters  10/10,  50%, 2 sec, 50 pps x15 min    Electrical Stimulation Goals  Neuromuscular facilitation      Vasopneumatic   Number Minutes Vasopneumatic   10 minutes    Vasopnuematic Location   Knee    Vasopneumatic Pressure  Medium    Vasopneumatic Temperature   34               PT Short Term Goals - 06/24/19 1042      PT SHORT TERM GOAL #1   Title  Patient will be independent with HEP    Baseline  no knowledge of exercises    Time  3    Period  Weeks    Status  Partially Met      PT SHORT TERM GOAL #2   Title  Patient will demonstrate 16 degrees or less of left knee extension AROM to improve gait mechanics.    Time  3    Period  Weeks    Status  Achieved        PT Long Term Goals - 06/24/19 1043      PT LONG TERM GOAL #1   Title  Patient will be independent with advanced HEP    Baseline  no knowledge of exercises    Time  6    Period  Weeks    Status  On-going      PT LONG TERM GOAL #2   Title  Patient will demonstrate 115+ degrees of left knee flexion AROM to improve mobility during functional tasks.    Baseline  20-100 degrees of left knee AROM.    Time  6    Period  Weeks    Status  Achieved      PT LONG TERM GOAL #3   Title  Patient will demonstrate 8 degrees or less of left knee extension AROM to improve gait mechanics    Baseline  20-100 degrees of left knee AROM    Time  6    Period  Weeks    Status  On-going   AROM -11 degrees 06/24/19     PT LONG TERM GOAL #4   Title  Patient will report ability to perform ADLs with left knee pain less than or equal to 4/10    Baseline  9/10 with ADLs    Time  6    Period  Weeks    Status  Achieved      PT LONG TERM GOAL #5   Title  Patient will demonstrate 4+/5 or greater left knee AROM to improve stability during functional tasks.    Baseline  not assessed secondary to precaution    Period  Weeks    Status  On-going            Plan - 06/28/19 1016    Clinical Impression Statement  Patient presented in  clinic with intermittant medial L knee discomfort which occurs more at nighttime. Patient able to complete prone hang with 2# without complaint. AROM L knee extension measured as 11 deg after prone hang. VMS continued due to continued lack of volitional contraction of L quad. Edema has seen a great reduction after taping and use of vasopneumatic machine. Normal modalities response noted following removal of the modalities.    Personal Factors and Comorbidities  Age;Comorbidity 1    Comorbidities  HTN    Examination-Activity Limitations  Bed Mobility;Stairs;Stand;Sleep    Examination-Participation Restrictions  Meal Prep;Driving;Yard Work    Stability/Clinical Decision Making  Stable/Uncomplicated    Rehab Potential  Good    PT Frequency  3x / week    PT Duration  6 weeks    PT Treatment/Interventions  ADLs/Self Care Home Management;Electrical Stimulation;Moist Heat;Balance training;Therapeutic exercise;Therapeutic activities;Functional mobility training;Stair training;Gait training;Neuromuscular re-education;Passive range of motion;Scar  mobilization;Manual techniques;Taping;Vasopneumatic Device    PT Next Visit Plan  Cont with POC for left knee ext ROM and strengthening/ VASO and taping for edema PRN    PT Home Exercise Plan  see patient education section    Consulted and Agree with Plan of Care  Patient       Patient will benefit from skilled therapeutic intervention in order to improve the following deficits and impairments:  Decreased activity tolerance, Decreased balance, Decreased range of motion, Decreased skin integrity, Decreased strength, Increased edema, Difficulty walking, Pain  Visit Diagnosis: 1. Acute pain of left knee   2. Stiffness of left knee, not elsewhere classified   3. Difficulty in walking, not elsewhere classified        Problem List Patient Active Problem List   Diagnosis Date Noted  . S/P left TKA 05/25/2019  . S/P hip replacement, right 01/21/2019  .  Overweight (BMI 25.0-29.9) 01/20/2019  . S/P right THA, AA 01/19/2019  . Substance abuse ([place]) 05/24/2017  . Elevated troponin 05/24/2017  . Diarrhea 05/24/2017  . Sepsis ([place]) 05/23/2017  . Ventral hernia 11/10/2013  . Recurrent ventral incisional hernia 08/26/2013  . Disruption of wound, unspecified 03/01/2013  . Respiratory failure, post-operative ([place]) 10/28/2012  . Acute renal insufficiency, nonoliguric 10/28/2012  . Status post laparotomy, evac of hemoperitoneum and wound vac placement 11/26 10/28/2012  .  Ex lap with partial colectomy with revision of colon anastomosis, repair of ventral hernia 11/25 10/28/2012  . Encephalopathy acute 10/27/2012  . Abdominal compartment syndrome, post lap 11/26 10/27/2012  . Shock due to abdominal compartment syndrome and acute blood loss 10/27/2012  . Hemoperitoneum, resolved post lap 11/26 10/27/2012  . H/O Hypertension 01/06/2012  . Hypothyroid 01/06/2012    Standley Brooking, PTA 06/28/2019, 10:53 AM  [facility] [address] [place], [place], [address] Phone: [phone]   Fax:  [phone]  Name: Douglas Wiggins MRN: [id number] Date of Birth: 1966-05-08

## 2019-07-02 ENCOUNTER — Ambulatory Visit: Payer: Medicaid Other | Admitting: Physical Therapy

## 2019-07-02 ENCOUNTER — Other Ambulatory Visit: Payer: Self-pay

## 2019-07-02 ENCOUNTER — Encounter: Payer: Self-pay | Admitting: Physical Therapy

## 2019-07-02 DIAGNOSIS — R262 Difficulty in walking, not elsewhere classified: Secondary | ICD-10-CM

## 2019-07-02 DIAGNOSIS — M25562 Pain in left knee: Secondary | ICD-10-CM

## 2019-07-02 DIAGNOSIS — M25662 Stiffness of left knee, not elsewhere classified: Secondary | ICD-10-CM

## 2019-07-02 NOTE — Therapy (Signed)
[facility] [place], [place], [address] Phone: [phone]   Fax:  [phone]  Physical Therapy Treatment  Patient Details  Name: Douglas Wiggins MRN: [id number] Date of Birth: [birth year] Referring Provider (PT): [name], MD   Encounter Date: 07/02/2019  PT End of Session - 07/02/19 0950    Visit Number  7    Number of Visits  18    Date for PT Re-Evaluation  07/26/19    Authorization Type  Medicaid    PT Start Time  [phone]    PT Stop Time  1027    PT Time Calculation (min)  39 min    Activity Tolerance  Patient tolerated treatment well    Behavior During Therapy  [facility] for tasks assessed/performed       Past Medical History:  Diagnosis Date  . ADHD   . Chronic lower back pain   . Complication of anesthesia    Pt reports that he 'hiccups' 2 days following administration of anesthesia  . Depression   . Diverticulosis of colon   . GERD (gastroesophageal reflux disease)   . Gout    per pt last flare-up 2018  . Hiatal hernia   . History of closed head injury 1992   traumatic---  per pt in coma few days,  residual memory issues  . History of diverticulitis of colon    s/p  colon resection for sigmoid perforation 01-06-2012  . History of sepsis 05/2017  . Hyperlipidemia   . Hypertension   . Hypothyroidism   . Lichen planus   . Memory difficulty    seconday traumatic head injury 1992  . Migraines   . Numbness and tingling of right arm    due to  cervical nerve damage  . OA (osteoarthritis)    right hip,  back, knees, ankles, neck  . OSA (obstructive sleep apnea)    does not wear cpap/states MILD- no sleep study in years  . Sciatica, right side   . Wears glasses   . Wears partial dentures    upper     Past Surgical History:  Procedure Laterality Date  . ANKLE ARTHROSCOPY Right 05-20-2003   dr graves @[facility]   . APPLICATION OF WOUND VAC  10/29/2012   Procedure: APPLICATION OF WOUND VAC;  Surgeon: Imogene Burn. Georgette Dover, MD;  Location: [place] [facility];  Service: General;;  . CARPAL TUNNEL RELEASE Left 1990's  . COLON RESECTION  10/26/2012   Procedure: COLON RESECTION;  Surgeon: Imogene Burn. Georgette Dover, MD;  Location: [place] [facility];  Service: General;  Laterality: N/A;  Partial colon resection with anastomosis  . COLOSTOMY CLOSURE  04/13/2012   Procedure: COLOSTOMY CLOSURE;  Surgeon: Imogene Burn. Georgette Dover, MD;  [facility];  Service: General;  Laterality: N/A;  . COLOSTOMY REVISION  01/06/2012   Procedure: COLON RESECTION SIGMOID;  Surgeon: Imogene Burn. Georgette Dover, MD;  [facility];  Service: General;  Laterality: N/A;  with hartmans procedure  . HAND SURGERY Left 08-02-2009   dr Burney Gauze @[facility]    repair left fifth digit crush injury  . INSERTION OF MESH N/A 11/10/2013   Procedure: INSERTION OF MESH;  Surgeon: Imogene Burn. Georgette Dover, MD;  Location: [place];  Service: General;  Laterality: N/A;  . KNEE ARTHROSCOPY Right 1990's  . LAPAROTOMY  10/26/2012   Procedure: EXPLORATORY LAPAROTOMY;  Surgeon: Imogene Burn. Georgette Dover, MD;  Location: [place];  Service: General;  Laterality: N/A;  . LAPAROTOMY  10/27/2012   Procedure: EXPLORATORY LAPAROTOMY;  Surgeon:  Zenovia Jarred, MD;  Location: [place];  Service: General;  Laterality: N/A;  . LAPAROTOMY  10/29/2012   Procedure: EXPLORATORY LAPAROTOMY;  Surgeon: Imogene Burn. Georgette Dover, MD;  Location: [place];  Service: General;  Laterality: N/A;  exploratory laparotomy, partial closure of abdominal wound,    . SHOULDER ARTHROSCOPY Right 10-06-2008  dr supple  @[facility]   . TOTAL HIP ARTHROPLASTY Right 01/19/2019   Procedure: TOTAL HIP ARTHROPLASTY ANTERIOR APPROACH;  Surgeon: [name], MD;  [facility];  Service: Orthopedics;  Laterality: Right;  70 mins  . TOTAL KNEE ARTHROPLASTY Left 05/25/2019   Procedure: TOTAL KNEE ARTHROPLASTY;  Surgeon: [name], MD;  [facility];  Service: Orthopedics;  Laterality: Left;  41mns  . VENTRAL HERNIA REPAIR  10/26/2012   Procedure: HERNIA REPAIR VENTRAL ADULT;   Surgeon: MImogene Burn TGeorgette Dover MD;  [place]  Service: General;  Laterality: N/A;  Primary Repair Ventral Hernia  . VENTRAL HERNIA REPAIR N/A 11/10/2013   Procedure: LAPAROSCOPIC VENTRAL HERNIA ;  Surgeon: MImogene Burn TGeorgette Dover MD;  [place]  Service: General;  Laterality: N/A;    There were no vitals filed for this visit.  Subjective Assessment - 07/02/19 0948    Subjective  COVID 19 screening performed on patient upon arrival. Reports still having some sharp pains in medial L knee. Has been busy and has hardly iced since previous PT session.    Pertinent History  HTN, left TKA 6/23/220, right THA 01/19/2019, Ventral Hernia repair 2013, shoulder arthroscopy 10/06/2008    Limitations  Sitting;Standing;Walking;House hold activities    Patient Stated Goals  walk without walker, get back to normal    Currently in Pain?  No/denies         [facility] - 07/02/19 0001      Assessment   Medical Diagnosis  Presence of left artificial knee joint    Referring Provider (PT)  MParalee Cancel MD    Onset Date/Surgical Date  05/25/19    Next MD Visit  07/09/2019    Prior Therapy  no      Precautions   Precautions  Other (comment)    Precaution Comments  no ultrasound      Restrictions   Weight Bearing Restrictions  No      ROM / Strength   AROM / PROM / Strength  AROM      AROM   Overall AROM   Within functional limits for tasks performed;Deficits    AROM Assessment Site  Knee    Right/Left Knee  Left    Left Knee Extension  6    Left Knee Flexion  129                   [facility] Adult PT Treatment/Exercise - 07/02/19 0001      Knee/Hip Exercises: Aerobic   Recumbent Bike  L4, seat 12 x10 min      Knee/Hip Exercises: Machines for Strengthening   Cybex Knee Extension  10# 3x10 reps     Cybex Knee Flexion  40# 3x10 reps    Cybex Leg Press  3 pl, seat 8 x30 reps      Knee/Hip Exercises: Supine   Straight Leg Raises  AROM;Left;3 sets;10 reps      Modalities    Modalities  EPsychologist, educationalLocation  L knee    Electrical Stimulation Action  Pre-Mod    Electrical Stimulation Parameters  80-150 hz x10 min    Electrical Stimulation Goals  Edema      Vasopneumatic   Number Minutes Vasopneumatic   10 minutes    Vasopnuematic Location   Knee    Vasopneumatic Pressure  Medium    Vasopneumatic Temperature   34               PT Short Term Goals - 06/24/19 1042      PT SHORT TERM GOAL #1   Title  Patient will be independent with HEP    Baseline  no knowledge of exercises    Time  3    Period  Weeks    Status  Partially Met      PT SHORT TERM GOAL #2   Title  Patient will demonstrate 16 degrees or less of left knee extension AROM to improve gait mechanics.    Time  3    Period  Weeks    Status  Achieved        PT Long Term Goals - 07/02/19 1045      PT LONG TERM GOAL #1   Title  Patient will be independent with advanced HEP    Baseline  no knowledge of exercises    Time  6    Period  Weeks    Status  On-going      PT LONG TERM GOAL #2   Title  Patient will demonstrate 115+ degrees of left knee flexion AROM to improve mobility during functional tasks.    Baseline  20-100 degrees of left knee AROM.    Time  6    Period  Weeks    Status  Achieved      PT LONG TERM GOAL #3   Title  Patient will demonstrate 8 degrees or less of left knee extension AROM to improve gait mechanics    Baseline  20-100 degrees of left knee AROM    Time  6    Period  Weeks    Status  Achieved      PT LONG TERM GOAL #4   Title  Patient will report ability to perform ADLs with left knee pain less than or equal to 4/10    Baseline  9/10 with ADLs    Time  6    Period  Weeks    Status  Achieved      PT LONG TERM GOAL #5   Title  Patient will demonstrate 4+/5 or greater left knee AROM to improve stability during functional tasks.    Baseline  not assessed secondary to  precaution    Period  Weeks    Status  On-going            Plan - 07/02/19 1022    Clinical Impression Statement  Patient presented in clinic with only reports of intermittant L medial knee pains that are described as sharp. Patient introduced to more machine knee strengthening exercises without complaint of any pain. Edema of L knee has greatly decreased in the last several visits but still notable surrounding L patella. AROM of L knee measured as 6-129 deg. Normal modalities response noted following removal of the modalities for the reduction of edema.    Personal Factors and Comorbidities  Age;Comorbidity 1    Comorbidities  HTN    Examination-Activity Limitations  Bed Mobility;Stairs;Stand;Sleep    Examination-Participation Restrictions  Meal Prep;Driving;Yard Work    Stability/Clinical Decision Making  Stable/Uncomplicated    Rehab Potential  Good  PT Frequency  3x / week    PT Duration  6 weeks    PT Treatment/Interventions  ADLs/Self Care Home Management;Electrical Stimulation;Moist Heat;Balance training;Therapeutic exercise;Therapeutic activities;Functional mobility training;Stair training;Gait training;Neuromuscular re-education;Passive range of motion;Scar mobilization;Manual techniques;Taping;Vasopneumatic Device    PT Next Visit Plan  Cont with POC for left knee ext ROM and strengthening/ VASO and taping for edema PRN    PT Home Exercise Plan  see patient education section    Consulted and Agree with Plan of Care  Patient       Patient will benefit from skilled therapeutic intervention in order to improve the following deficits and impairments:  Decreased activity tolerance, Decreased balance, Decreased range of motion, Decreased skin integrity, Decreased strength, Increased edema, Difficulty walking, Pain  Visit Diagnosis: 1. Acute pain of left knee   2. Stiffness of left knee, not elsewhere classified   3. Difficulty in walking, not elsewhere classified         Problem List Patient Active Problem List   Diagnosis Date Noted  . S/P left TKA 05/25/2019  . S/P hip replacement, right 01/21/2019  . Overweight (BMI 25.0-29.9) 01/20/2019  . S/P right THA, AA 01/19/2019  . Substance abuse ([place]) 05/24/2017  . Elevated troponin 05/24/2017  . Diarrhea 05/24/2017  . Sepsis ([place]) 05/23/2017  . Ventral hernia 11/10/2013  . Recurrent ventral incisional hernia 08/26/2013  . Disruption of wound, unspecified 03/01/2013  . Respiratory failure, post-operative ([place]) 10/28/2012  . Acute renal insufficiency, nonoliguric 10/28/2012  . Status post laparotomy, evac of hemoperitoneum and wound vac placement 11/26 10/28/2012  .  Ex lap with partial colectomy with revision of colon anastomosis, repair of ventral hernia 11/25 10/28/2012  . Encephalopathy acute 10/27/2012  . Abdominal compartment syndrome, post lap 11/26 10/27/2012  . Shock due to abdominal compartment syndrome and acute blood loss 10/27/2012  . Hemoperitoneum, resolved post lap 11/26 10/27/2012  . H/O Hypertension 01/06/2012  . Hypothyroid 01/06/2012    Standley Brooking, PTA 07/02/2019, 10:45 AM  [facility] [address] [place], [place], [address] Phone: [phone]   Fax:  [phone]  Name: Douglas Wiggins MRN: [id number] Date of Birth: [birth year]

## 2019-07-05 ENCOUNTER — Other Ambulatory Visit: Payer: Self-pay

## 2019-07-05 ENCOUNTER — Encounter: Payer: Self-pay | Admitting: Physical Therapy

## 2019-07-05 ENCOUNTER — Ambulatory Visit: Payer: Medicaid Other | Attending: Orthopedic Surgery | Admitting: Physical Therapy

## 2019-07-05 DIAGNOSIS — M25562 Pain in left knee: Secondary | ICD-10-CM | POA: Insufficient documentation

## 2019-07-05 DIAGNOSIS — R262 Difficulty in walking, not elsewhere classified: Secondary | ICD-10-CM | POA: Insufficient documentation

## 2019-07-05 DIAGNOSIS — M25662 Stiffness of left knee, not elsewhere classified: Secondary | ICD-10-CM | POA: Insufficient documentation

## 2019-07-05 NOTE — Therapy (Addendum)
[facility] [place], [place], [address] Phone: [phone]   Fax:  [phone]  Physical Therapy Treatment PHYSICAL THERAPY DISCHARGE SUMMARY  Visits from Start of Care: 8  Current functional level related to goals / functional outcomes: See below   Remaining deficits: See goals   Education / Equipment: HEP  Plan: Patient agrees to discharge.  Patient goals were partially met. Patient is being discharged due to not returning since the last visit.  ?????  Gabriela Eves, PT, DPT 02/07/21   Patient Details  Name: Douglas Wiggins MRN: [id number] Date of Birth: [birth year] Referring Provider (PT): [name], MD   Encounter Date: 07/05/2019  PT End of Session - 07/05/19 0945    Visit Number  8    Number of Visits  18    Date for PT Re-Evaluation  07/26/19    Authorization Type  Medicaid    PT Start Time  6256    PT Stop Time  1028    PT Time Calculation (min)  44 min    Activity Tolerance  Patient tolerated treatment well    Behavior During Therapy  [facility] for tasks assessed/performed       Past Medical History:  Diagnosis Date  . ADHD   . Chronic lower back pain   . Complication of anesthesia    Pt reports that he 'hiccups' 2 days following administration of anesthesia  . Depression   . Diverticulosis of colon   . GERD (gastroesophageal reflux disease)   . Gout    per pt last flare-up 2018  . Hiatal hernia   . History of closed head injury 1992   traumatic---  per pt in coma few days,  residual memory issues  . History of diverticulitis of colon    s/p  colon resection for sigmoid perforation 01-06-2012  . History of sepsis 05/2017  . Hyperlipidemia   . Hypertension   . Hypothyroidism   . Lichen planus   . Memory difficulty    seconday traumatic head injury 1992  . Migraines   . Numbness and tingling of right arm    due to  cervical nerve damage  . OA (osteoarthritis)    right hip,  back, knees,  ankles, neck  . OSA (obstructive sleep apnea)    does not wear cpap/states MILD- no sleep study in years  . Sciatica, right side   . Wears glasses   . Wears partial dentures    upper     Past Surgical History:  Procedure Laterality Date  . ANKLE ARTHROSCOPY Right 05-20-2003   dr graves @[facility]   . APPLICATION OF WOUND VAC  10/29/2012   Procedure: APPLICATION OF WOUND VAC;  Surgeon: Imogene Burn. Georgette Dover, MD;  Location: [place] [facility];  Service: General;;  . CARPAL TUNNEL RELEASE Left 1990's  . COLON RESECTION  10/26/2012   Procedure: COLON RESECTION;  Surgeon: Imogene Burn. Georgette Dover, MD;  Location: [place] [facility];  Service: General;  Laterality: N/A;  Partial colon resection with anastomosis  . COLOSTOMY CLOSURE  04/13/2012   Procedure: COLOSTOMY CLOSURE;  Surgeon: Imogene Burn. Georgette Dover, MD;  [facility];  Service: General;  Laterality: N/A;  . COLOSTOMY REVISION  01/06/2012   Procedure: COLON RESECTION SIGMOID;  Surgeon: Imogene Burn. Georgette Dover, MD;  [facility];  Service: General;  Laterality: N/A;  with hartmans procedure  . HAND SURGERY Left 08-02-2009   dr Burney Gauze @[facility]    repair left fifth digit crush injury  . INSERTION OF MESH  N/A 11/10/2013   Procedure: INSERTION OF MESH;  Surgeon: Imogene Burn. Georgette Dover, MD;  Location: [place];  Service: General;  Laterality: N/A;  . KNEE ARTHROSCOPY Right 1990's  . LAPAROTOMY  10/26/2012   Procedure: EXPLORATORY LAPAROTOMY;  Surgeon: Imogene Burn. Georgette Dover, MD;  Location: [place];  Service: General;  Laterality: N/A;  . LAPAROTOMY  10/27/2012   Procedure: EXPLORATORY LAPAROTOMY;  Surgeon: Zenovia Jarred, MD;  Location: [place];  Service: General;  Laterality: N/A;  . LAPAROTOMY  10/29/2012   Procedure: EXPLORATORY LAPAROTOMY;  Surgeon: Imogene Burn. Georgette Dover, MD;  Location: [place];  Service: General;  Laterality: N/A;  exploratory laparotomy, partial closure of abdominal wound,    . SHOULDER ARTHROSCOPY Right 10-06-2008  dr supple  @[facility]   . TOTAL HIP ARTHROPLASTY Right 01/19/2019   Procedure:  TOTAL HIP ARTHROPLASTY ANTERIOR APPROACH;  Surgeon: [name], MD;  [facility];  Service: Orthopedics;  Laterality: Right;  70 mins  . TOTAL KNEE ARTHROPLASTY Left 05/25/2019   Procedure: TOTAL KNEE ARTHROPLASTY;  Surgeon: [name], MD;  [facility];  Service: Orthopedics;  Laterality: Left;  34mns  . VENTRAL HERNIA REPAIR  10/26/2012   Procedure: HERNIA REPAIR VENTRAL ADULT;  Surgeon: MImogene Burn TGeorgette Dover MD;  [place]  Service: General;  Laterality: N/A;  Primary Repair Ventral Hernia  . VENTRAL HERNIA REPAIR N/A 11/10/2013   Procedure: LAPAROSCOPIC VENTRAL HERNIA ;  Surgeon: MImogene Burn TGeorgette Dover MD;  [place]  Service: General;  Laterality: N/A;    There were no vitals filed for this visit.  Subjective Assessment - 07/05/19 0945    Subjective  COVID 19 screening performed on patient upon arrival. Reports still having intermittant sharp pain but not constant.    Pertinent History  HTN, left TKA 6/23/220, right THA 01/19/2019, Ventral Hernia repair 2013, shoulder arthroscopy 10/06/2008    Limitations  Sitting;Standing;Walking;House hold activities    Patient Stated Goals  walk without walker, get back to normal    Currently in Pain?  No/denies         [facility] 0001      Assessment   Medical Diagnosis  Presence of left artificial knee joint    Referring Provider (PT)  MParalee Cancel MD    Onset Date/Surgical Date  05/25/19    Next MD Visit  07/09/2019    Prior Therapy  no      Precautions   Precautions  Other (comment)    Precaution Comments  no ultrasound      Restrictions   Weight Bearing Restrictions  No      ROM / Strength   AROM / PROM / Strength  AROM      AROM   Overall AROM   Within functional limits for tasks performed;Deficits    AROM Assessment Site  Knee    Right/Left Knee  Left    Left Knee Extension  5    Left Knee Flexion  130                   [facility] Adult PT Treatment/Exercise - 07/05/19 0001       Ambulation/Gait   Stairs  Yes    Stairs Assistance  7: Independent    Stair Management Technique  No rails;Alternating pattern;Forwards    Number of Stairs  4   x1 RT   Height of Stairs  6.5      Knee/Hip Exercises: Aerobic   Recumbent Bike  L4, seat 12  x10 min      Knee/Hip Exercises: Machines for Strengthening   Cybex Knee Extension  20# 3x10 reps    Cybex Knee Flexion  50# 3x10 reps    Cybex Leg Press  3 pl, seat 8 x30 reps      Knee/Hip Exercises: Standing   Terminal Knee Extension  Strengthening;Left;20 reps;Limitations    Terminal Knee Extension Limitations  Orange XTS      Modalities   Modalities  Vasopneumatic      Vasopneumatic   Number Minutes Vasopneumatic   10 minutes    Vasopnuematic Location   Knee    Vasopneumatic Pressure  Medium    Vasopneumatic Temperature   34               PT Short Term Goals - 06/24/19 1042      PT SHORT TERM GOAL #1   Title  Patient will be independent with HEP    Baseline  no knowledge of exercises    Time  3    Period  Weeks    Status  Partially Met      PT SHORT TERM GOAL #2   Title  Patient will demonstrate 16 degrees or less of left knee extension AROM to improve gait mechanics.    Time  3    Period  Weeks    Status  Achieved        PT Long Term Goals - 07/02/19 1045      PT LONG TERM GOAL #1   Title  Patient will be independent with advanced HEP    Baseline  no knowledge of exercises    Time  6    Period  Weeks    Status  On-going      PT LONG TERM GOAL #2   Title  Patient will demonstrate 115+ degrees of left knee flexion AROM to improve mobility during functional tasks.    Baseline  20-100 degrees of left knee AROM.    Time  6    Period  Weeks    Status  Achieved      PT LONG TERM GOAL #3   Title  Patient will demonstrate 8 degrees or less of left knee extension AROM to improve gait mechanics    Baseline  20-100 degrees of left knee AROM    Time  6    Period  Weeks    Status  Achieved       PT LONG TERM GOAL #4   Title  Patient will report ability to perform ADLs with left knee pain less than or equal to 4/10    Baseline  9/10 with ADLs    Time  6    Period  Weeks    Status  Achieved      PT LONG TERM GOAL #5   Title  Patient will demonstrate 4+/5 or greater left knee AROM to improve stability during functional tasks.    Baseline  not assessed secondary to precaution    Period  Weeks    Status  On-going            Plan - 07/05/19 1023    Clinical Impression Statement  Patient presented in clinic with only intermittant sharp pain in L knee. Patient reports not icing L knee as much as he should at home. Patient able to complete all machine strengthening well with no complaints. Patient introduced to [facility] which limited at end range. No limitations or pain reported with ascending or  descending stairs. AROM of L knee measured as 5-130 deg. Normal vasopneumatic response noted following removal of the modality.    Personal Factors and Comorbidities  Age;Comorbidity 1    Comorbidities  HTN    Examination-Activity Limitations  Bed Mobility;Stairs;Stand;Sleep    Examination-Participation Restrictions  Meal Prep;Driving;Yard Work    Stability/Clinical Decision Making  Stable/Uncomplicated    Rehab Potential  Good    PT Frequency  3x / week    PT Duration  6 weeks    PT Treatment/Interventions  ADLs/Self Care Home Management;Electrical Stimulation;Moist Heat;Balance training;Therapeutic exercise;Therapeutic activities;Functional mobility training;Stair training;Gait training;Neuromuscular re-education;Passive range of motion;Scar mobilization;Manual techniques;Taping;Vasopneumatic Device    PT Next Visit Plan  Cont with POC for left knee ext ROM and strengthening/ VASO and taping for edema PRN    PT Home Exercise Plan  see patient education section    Consulted and Agree with Plan of Care  Patient       Patient will benefit from skilled therapeutic intervention in order to  improve the following deficits and impairments:  Decreased activity tolerance, Decreased balance, Decreased range of motion, Decreased skin integrity, Decreased strength, Increased edema, Difficulty walking, Pain  Visit Diagnosis: 1. Acute pain of left knee   2. Stiffness of left knee, not elsewhere classified   3. Difficulty in walking, not elsewhere classified        Problem List Patient Active Problem List   Diagnosis Date Noted  . S/P left TKA 05/25/2019  . S/P hip replacement, right 01/21/2019  . Overweight (BMI 25.0-29.9) 01/20/2019  . S/P right THA, AA 01/19/2019  . Substance abuse ([place]) 05/24/2017  . Elevated troponin 05/24/2017  . Diarrhea 05/24/2017  . Sepsis ([place]) 05/23/2017  . Ventral hernia 11/10/2013  . Recurrent ventral incisional hernia 08/26/2013  . Disruption of wound, unspecified 03/01/2013  . Respiratory failure, post-operative ([place]) 10/28/2012  . Acute renal insufficiency, nonoliguric 10/28/2012  . Status post laparotomy, evac of hemoperitoneum and wound vac placement 11/26 10/28/2012  .  Ex lap with partial colectomy with revision of colon anastomosis, repair of ventral hernia 11/25 10/28/2012  . Encephalopathy acute 10/27/2012  . Abdominal compartment syndrome, post lap 11/26 10/27/2012  . Shock due to abdominal compartment syndrome and acute blood loss 10/27/2012  . Hemoperitoneum, resolved post lap 11/26 10/27/2012  . H/O Hypertension 01/06/2012  . Hypothyroid 01/06/2012    Standley Brooking, PTA 07/05/2019, 10:42 AM  [facility] [address] [place], [place], [address] Phone: [phone]   Fax:  [phone]  Name: Douglas Wiggins MRN: [id number] Date of Birth: Apr 28, 1966

## 2019-07-09 ENCOUNTER — Ambulatory Visit: Payer: Medicaid Other | Admitting: Physical Therapy

## 2020-05-25 ENCOUNTER — Encounter: Payer: Self-pay | Admitting: Neurology

## 2020-07-26 NOTE — Progress Notes (Deleted)
[facility] Clinic Note - Initial Visit   Date: 07/26/20  Douglas Wiggins MRN: [id number] DOB: [birth year]   Dear Dr Marland KitchenMelina Copa, Lubertha South, DO:  Thank you for your kind referral of Douglas Wiggins for consultation of right hand paresthesias. Although his history is well known to you, please allow [place] to reiterate it for the purpose of our medical record. The patient was accompanied to the clinic by *** who also provides collateral information.     History of Present Illness: Douglas Wiggins is a 54 y.o. ***-handed male with depression, GERD, hypertension, hyperlipidemia, hypothyroidism, insomnia, ADHD, migraine, and chronic pain syndrome presenting for evaluation of right hand tingling.   Starting around 2020, he began having numbness and tingling involving the right thumb, index finger, and middle finger.  He also have episodic radiating pain into the right arm and shoulder.  Symptoms are worse at nighttime.    Out-side paper records, electronic medical record, and images have been reviewed where available and summarized as: *** No results found for: HGBA1C No results found for: VITAMINB12 No results found for: TSH No results found for: ESRSEDRATE, POCTSEDRATE  Past Medical History:  Diagnosis Date  . ADHD   . Chronic lower back pain   . Complication of anesthesia    Pt reports that he 'hiccups' 2 days following administration of anesthesia  . Depression   . Diverticulosis of colon   . GERD (gastroesophageal reflux disease)   . Gout    per pt last flare-up 2018  . Hiatal hernia   . History of closed head injury 1992   traumatic---  per pt in coma few days,  residual memory issues  . History of diverticulitis of colon    s/p  colon resection for sigmoid perforation 01-06-2012  . History of sepsis 05/2017  . Hyperlipidemia   . Hypertension   . Hypothyroidism   . Lichen planus   . Memory difficulty    seconday traumatic head injury 1992  .  Migraines   . Numbness and tingling of right arm    due to  cervical nerve damage  . OA (osteoarthritis)    right hip,  back, knees, ankles, neck  . OSA (obstructive sleep apnea)    does not wear cpap/states MILD- no sleep study in years  . Sciatica, right side   . Wears glasses   . Wears partial dentures    upper     Past Surgical History:  Procedure Laterality Date  . ANKLE ARTHROSCOPY Right 05-20-2003   dr graves @[facility]   . APPLICATION OF WOUND VAC  10/29/2012   Procedure: APPLICATION OF WOUND VAC;  Surgeon: Imogene Burn. Georgette Dover, MD;  Location: [place] [facility];  Service: General;;  . CARPAL TUNNEL RELEASE Left 1990's  . COLON RESECTION  10/26/2012   Procedure: COLON RESECTION;  Surgeon: Imogene Burn. Georgette Dover, MD;  Location: [place] [facility];  Service: General;  Laterality: N/A;  Partial colon resection with anastomosis  . COLOSTOMY CLOSURE  04/13/2012   Procedure: COLOSTOMY CLOSURE;  Surgeon: Imogene Burn. Georgette Dover, MD;  [facility];  Service: General;  Laterality: N/A;  . COLOSTOMY REVISION  01/06/2012   Procedure: COLON RESECTION SIGMOID;  Surgeon: Imogene Burn. Georgette Dover, MD;  [facility];  Service: General;  Laterality: N/A;  with hartmans procedure  . HAND SURGERY Left 08-02-2009   dr Burney Gauze @[facility]    repair left fifth digit crush injury  . INSERTION OF MESH N/A 11/10/2013   Procedure: INSERTION OF MESH;  Surgeon: Imogene Burn. Georgette Dover, MD;  Location: [place];  Service: General;  Laterality: N/A;  . KNEE ARTHROSCOPY Right 1990's  . LAPAROTOMY  10/26/2012   Procedure: EXPLORATORY LAPAROTOMY;  Surgeon: Imogene Burn. Georgette Dover, MD;  Location: [place];  Service: General;  Laterality: N/A;  . LAPAROTOMY  10/27/2012   Procedure: EXPLORATORY LAPAROTOMY;  Surgeon: Zenovia Jarred, MD;  Location: [place];  Service: General;  Laterality: N/A;  . LAPAROTOMY  10/29/2012   Procedure: EXPLORATORY LAPAROTOMY;  Surgeon: Imogene Burn. Georgette Dover, MD;  Location: [place];  Service: General;  Laterality: N/A;  exploratory laparotomy, partial closure of  abdominal wound,    . SHOULDER ARTHROSCOPY Right 10-06-2008  dr supple  @[facility]   . TOTAL HIP ARTHROPLASTY Right 01/19/2019   Procedure: TOTAL HIP ARTHROPLASTY ANTERIOR APPROACH;  Surgeon: [name], MD;  [facility];  Service: Orthopedics;  Laterality: Right;  70 mins  . TOTAL KNEE ARTHROPLASTY Left 05/25/2019   Procedure: TOTAL KNEE ARTHROPLASTY;  Surgeon: [name], MD;  [facility];  Service: Orthopedics;  Laterality: Left;  53mins  . VENTRAL HERNIA REPAIR  10/26/2012   Procedure: HERNIA REPAIR VENTRAL ADULT;  Surgeon: Imogene Burn. Georgette Dover, MD;  Location: [place];  Service: General;  Laterality: N/A;  Primary Repair Ventral Hernia  . VENTRAL HERNIA REPAIR N/A 11/10/2013   Procedure: LAPAROSCOPIC VENTRAL HERNIA ;  Surgeon: Imogene Burn. Tsuei, MD;  Location: [place] [facility];  Service: General;  Laterality: N/A;     Medications:  Outpatient Encounter Medications as of 07/28/2020  Medication Sig  . amoxicillin (AMOXIL) 500 MG capsule Take 500 mg by mouth 4 (four) times daily.  Marland Kitchen amphetamine-dextroamphetamine (ADDERALL) 20 MG tablet Take 20 mg by mouth daily.   . carbamazepine (TEGRETOL) 200 MG tablet Take 100 mg by mouth 3 (three) times daily.   Marland Kitchen docusate sodium (COLACE) 100 MG capsule Take 1 capsule (100 mg total) by mouth 2 (two) times daily.  . DULoxetine (CYMBALTA) 60 MG capsule Take 60 mg by mouth daily.  . ferrous sulfate (FERROUSUL) 325 (65 FE) MG tablet Take 1 tablet (325 mg total) by mouth 3 (three) times daily with meals for 14 days.  Marland Kitchen HYDROcodone-acetaminophen (NORCO) 7.5-325 MG tablet Take 1-2 tablets by mouth every 4 (four) hours as needed for moderate pain.  Marland Kitchen levothyroxine (SYNTHROID, LEVOTHROID) 112 MCG tablet Take 112 mcg by mouth daily before breakfast.   . losartan-hydrochlorothiazide (HYZAAR) 100-12.5 MG tablet Take 1 tablet by mouth daily.  . methocarbamol (ROBAXIN) 500 MG tablet Take 1 tablet (500 mg total) by mouth every 6 (six) hours as needed for muscle spasms.  .  pantoprazole (PROTONIX) 40 MG tablet Take 1 tablet (40 mg total) by mouth at bedtime. (Patient taking differently: Take 40 mg by mouth daily. )  . polyethylene glycol (MIRALAX / GLYCOLAX) 17 g packet Take 17 g by mouth 2 (two) times daily.   No facility-administered encounter medications on file as of 07/28/2020.    Allergies:  Allergies  Allergen Reactions  . Sulfa Antibiotics Hives and Rash    Family History: No family history on file.  Social History: Social History   Tobacco Use  . Smoking status: Former Smoker    Types: Cigarettes  . Smokeless tobacco: Former Systems developer    Types: Chew    Quit date: 01/15/1989  . Tobacco comment: per pt 1 pp month  Vaping Use  . Vaping Use: Never used  Substance Use Topics  . Alcohol use: No  . Drug use: Not Currently  Comment: per pt last used last age 8s, early age 87s   Social History   Social History Narrative  . Not on file    Vital Signs:  There were no vitals taken for this visit.   General Medical Exam:  *** General:  Well appearing, comfortable.   Eyes/ENT: see cranial nerve examination.   Neck:   No carotid bruits. Respiratory:  Clear to auscultation, good air entry bilaterally.   Cardiac:  Regular rate and rhythm, no murmur.   Extremities:  No deformities, edema, or skin discoloration.  Skin:  No rashes or lesions.  Neurological Exam: MENTAL STATUS including orientation to time, place, person, recent and remote memory, attention span and concentration, language, and fund of knowledge is ***normal.  Speech is not dysarthric.  CRANIAL NERVES: II:  No visual field defects.  Unremarkable fundi.   III-IV-VI: Pupils equal round and reactive to light.  Normal conjugate, extra-ocular eye movements in all directions of gaze.  No nystagmus.  No ptosis***.   V:  Normal facial sensation.    VII:  Normal facial symmetry and movements.   VIII:  Normal hearing and vestibular function.   IX-X:  Normal palatal movement.   XI:   Normal shoulder shrug and head rotation.   XII:  Normal tongue strength and range of motion, no deviation or fasciculation.  MOTOR:  No atrophy, fasciculations or abnormal movements.  No pronator drift.   Upper Extremity:  Right  Left  Deltoid  5/5   5/5   Biceps  5/5   5/5   Triceps  5/5   5/5   Infraspinatus 5/5  5/5  Medial pectoralis 5/5  5/5  Wrist extensors  5/5   5/5   Wrist flexors  5/5   5/5   Finger extensors  5/5   5/5   Finger flexors  5/5   5/5   Dorsal interossei  5/5   5/5   Abductor pollicis  5/5   5/5   Tone (Ashworth scale)  0  0   Lower Extremity:  Right  Left  Hip flexors  5/5   5/5   Hip extensors  5/5   5/5   Adductor 5/5  5/5  Abductor 5/5  5/5  Knee flexors  5/5   5/5   Knee extensors  5/5   5/5   Dorsiflexors  5/5   5/5   Plantarflexors  5/5   5/5   Toe extensors  5/5   5/5   Toe flexors  5/5   5/5   Tone (Ashworth scale)  0  0   MSRs:  Right        Left                  brachioradialis 2+  2+  biceps 2+  2+  triceps 2+  2+  patellar 2+  2+  ankle jerk 2+  2+  Hoffman no  no  plantar response down  down   SENSORY:  Normal and symmetric perception of light touch, pinprick, vibration, and proprioception.  Romberg's sign absent.   COORDINATION/GAIT: Normal finger-to- nose-finger and heel-to-shin.  Intact rapid alternating movements bilaterally.  Able to rise from a chair without using arms.  Gait narrow based and stable. Tandem and stressed gait intact.    IMPRESSION: *** Right hand paresthesias most suggestive of tunnel syndrome  -Proceed with electrodiagnostic testing of the right upper extremity to characterize the nature of his symptoms.  PLAN/RECOMMENDATIONS:  *** Return  to clinic in *** months.  Total time spent: ***   Thank you for allowing me to participate in patient's care.  If I can answer any additional questions, I would be pleased to do so.    Sincerely,    Shaneta Cervenka K. Posey Pronto, DO

## 2020-07-28 ENCOUNTER — Ambulatory Visit: Payer: Medicaid Other | Admitting: Neurology

## 2020-10-16 ENCOUNTER — Ambulatory Visit: Payer: Medicare Other | Admitting: Neurology

## 2021-09-24 ENCOUNTER — Encounter: Payer: Self-pay | Admitting: Neurology

## 2021-09-24 ENCOUNTER — Other Ambulatory Visit: Payer: Self-pay

## 2021-09-24 ENCOUNTER — Ambulatory Visit (INDEPENDENT_AMBULATORY_CARE_PROVIDER_SITE_OTHER): Payer: Medicare Other | Admitting: Neurology

## 2021-09-24 VITALS — BP 149/91 | HR 78 | Ht 76.0 in | Wt 274.0 lb

## 2021-09-24 DIAGNOSIS — R6889 Other general symptoms and signs: Secondary | ICD-10-CM

## 2021-09-24 DIAGNOSIS — M5410 Radiculopathy, site unspecified: Secondary | ICD-10-CM | POA: Diagnosis not present

## 2021-09-24 DIAGNOSIS — G5603 Carpal tunnel syndrome, bilateral upper limbs: Secondary | ICD-10-CM
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
## 2021-09-24 NOTE — Patient Instructions (Signed)
MRI Cervical spine  NCS/EMG for bilateral carpal tunnel syndrome  Check Vitamin B12 level today  Return in 3 months

## 2021-09-25 ENCOUNTER — Telehealth: Payer: Self-pay | Admitting: Neurology

## 2021-09-25 LAB — VITAMIN B12: Vitamin B-12: 237 pg/mL (ref 232–1245)

## 2021-09-25 MED ORDER — VITAMIN B-12 1000 MCG PO TABS: 1000.0000 ug | ORAL_TABLET | Freq: Every day | ORAL | 0 refills | Status: DC

## 2021-09-25 NOTE — Telephone Encounter (Signed)
B 12 supplement sent to the pharmacy

## 2021-09-27 ENCOUNTER — Other Ambulatory Visit: Payer: Self-pay

## 2021-09-27 ENCOUNTER — Ambulatory Visit
Admission: RE | Admit: 2021-09-27 | Discharge: 2021-09-27 | Disposition: A | Discharge status: Home / Self Care | Payer: Medicare Other | Source: Ambulatory Visit | Attending: Neurology | Admitting: Neurology

## 2021-09-27 DIAGNOSIS — M5410 Radiculopathy, site unspecified: Secondary | ICD-10-CM

## 2021-09-27 DIAGNOSIS — R200 Anesthesia of skin: Secondary | ICD-10-CM

## 2021-09-27 DIAGNOSIS — G5603 Carpal tunnel syndrome, bilateral upper limbs: Secondary | ICD-10-CM

## 2021-10-03 ENCOUNTER — Telehealth: Payer: Self-pay | Admitting: Neurology

## 2021-10-03 DIAGNOSIS — M5410 Radiculopathy, site unspecified: Secondary | ICD-10-CM

## 2021-10-03 NOTE — Telephone Encounter (Signed)
Called patient discussed MRI Results showing:   - At C5-6 right greater than left uncovertebral joint hypertrophy, disc bulging with mild spinal stenosis, severe right and moderate left foraminal stenosis. - At C6-7 disc bulging with mild spinal stenosis and severe biforaminal stenosis. - At C3-4 uncovertebral joint hypertrophy with severe right and moderate left foraminal stenosis. - AtC4-5 uncovertebral joint hypertrophy with severe biforaminal stenosis   Will refer to neurosurgery for evaluation.   Alric Ran, MD

## 2021-10-04 ENCOUNTER — Telehealth: Payer: Self-pay | Admitting: Neurology

## 2021-10-04 NOTE — Telephone Encounter (Signed)
Sent to [facility] ph # [phone]. 

## 2021-10-17 ENCOUNTER — Telehealth: Payer: Self-pay | Admitting: Neurology

## 2021-10-17 NOTE — Telephone Encounter (Signed)
LVM & sent mychart msg informing pt of r/s needed for ncv/emg-office closing early on 12/15.

## 2021-10-31 ENCOUNTER — Encounter: Payer: Self-pay | Admitting: *Deleted

## 2021-11-15 ENCOUNTER — Telehealth: Payer: Self-pay | Admitting: Neurology

## 2021-11-15 ENCOUNTER — Encounter: Payer: Medicare Other | Admitting: Diagnostic Neuroimaging

## 2021-11-15 NOTE — Telephone Encounter (Addendum)
Reports his appt w/ Dr. Annette Stable (neurosurgeon) occurred prior to the completion of his NCV/EMG at our office. Dr. Annette Stable also placed an order for the same test. The patient had it completed at an outside location. He will request the results be sent to our office for Dr. April Manson to review.

## 2021-11-15 NOTE — Telephone Encounter (Signed)
Pt called to inquire about Nerve Conduction results

## 2021-11-15 NOTE — Telephone Encounter (Signed)
Contacted patient to reschedule Nerve Conduction. Pt stated, had nerve conduction done 4 or 5 weeks ago. Do not need to schedule a nerve conduction.   Transferred to the nurse.

## 2021-12-10 ENCOUNTER — Other Ambulatory Visit: Payer: Self-pay | Admitting: Neurosurgery

## 2021-12-17 NOTE — Progress Notes (Signed)
Surgical Instructions    Your procedure is scheduled on 12/21/21.  Report to [facility] Main Entrance "A" at 8:30 A.M., then check in with the Admitting office.  Call this number if you have problems the morning of surgery:  [phone]   If you have any questions prior to your surgery date call [phone]: Open Monday-Friday 8am-4pm    Remember:  Do not eat after midnight the night before your surgery  You may drink clear liquids until 7:30 the morning of your surgery.   Clear liquids allowed are: Water, Non-Citrus Juices (without pulp), Carbonated Beverages, Clear Tea, Black Coffee ONLY (NO MILK, CREAM OR POWDERED CREAMER of any kind), and Gatorade    Take these medicines the morning of surgery with A SIP OF WATER: amLODipine (NORVASC) gabapentin (NEURONTIN) levothyroxine (SYNTHROID) pantoprazole (PROTONIX)   As Needed: cyclobenzaprine (FLEXERIL) HYDROcodone-acetaminophen (NORCO)   As of today, STOP taking any Aspirin (unless otherwise instructed by your surgeon) meloxicam (MOBIC), Aleve, Naproxen, Ibuprofen, Motrin, Advil, Goody's, BC's, all herbal medications, fish oil, and all vitamins.   After your COVID test   You are not required to quarantine however you are required to wear a well-fitting mask when you are out and around people not in your household.  If your mask becomes wet or soiled, replace with a new one.  Wash your hands often with soap and water for 20 seconds or clean your hands with an alcohol-based hand sanitizer that contains at least 60% alcohol.  Do not share personal items.  Notify your provider: if you are in close contact with someone who has COVID  or if you develop a fever of 100.4 or greater, sneezing, cough, sore throat, shortness of breath or body aches.           Do not wear jewelry  Do not wear lotions, powders, colognes, or deodorant. Do not shave 48 hours prior to surgery.  Men may shave face and neck. Do not bring valuables to the  hospital.              [facility] is not responsible for any belongings or valuables.  Do NOT Smoke (Tobacco/Vaping)  24 hours prior to your procedure  If you use a CPAP at night, you may bring your mask for your overnight stay.   Contacts, glasses, hearing aids, dentures or partials may not be worn into surgery, please bring cases for these belongings   For patients admitted to the hospital, discharge time will be determined by your treatment team.   Patients discharged the day of surgery will not be allowed to drive home, and someone needs to stay with them for 24 hours.  NO VISITORS WILL BE ALLOWED IN PRE-OP WHERE PATIENTS ARE PREPPED FOR SURGERY.  ONLY 1 SUPPORT PERSON MAY BE PRESENT IN THE WAITING ROOM WHILE YOU ARE IN SURGERY.  IF YOU ARE TO BE ADMITTED, ONCE YOU ARE IN YOUR ROOM YOU WILL BE ALLOWED TWO (2) VISITORS. 1 (ONE) VISITOR MAY STAY OVERNIGHT BUT MUST ARRIVE TO THE ROOM BY 8pm.  Minor children may have two parents present. Special consideration for safety and communication needs will be reviewed on a case by case basis.  Special instructions:    Oral Hygiene is also important to reduce your risk of infection.  Remember - BRUSH YOUR TEETH THE MORNING OF SURGERY WITH YOUR REGULAR TOOTHPASTE   [facility]- Preparing For Surgery  Before surgery, you can play an important role. Because skin is not sterile, your skin  needs to be as free of germs as possible. You can reduce the number of germs on your skin by washing with CHG (chlorahexidine gluconate) Soap before surgery.  CHG is an antiseptic cleaner which kills germs and bonds with the skin to continue killing germs even after washing.     Please do not use if you have an allergy to CHG or antibacterial soaps. If your skin becomes reddened/irritated stop using the CHG.  Do not shave (including legs and underarms) for at least 48 hours prior to first CHG shower. It is OK to shave your face.  Please follow these instructions  carefully.     Shower the NIGHT BEFORE SURGERY and the MORNING OF SURGERY with CHG Soap.   If you chose to wash your hair, wash your hair first as usual with your normal shampoo. After you shampoo, rinse your hair and body thoroughly to remove the shampoo.  Then [employer] and genitals (private parts) with your normal soap and rinse thoroughly to remove soap.  After that Use CHG Soap as you would any other liquid soap. You can apply CHG directly to the skin and wash gently with a scrungie or a clean washcloth.   Apply the CHG Soap to your body ONLY FROM THE NECK DOWN.  Do not use on open wounds or open sores. Avoid contact with your eyes, ears, mouth and genitals (private parts). Wash Face and genitals (private parts)  with your normal soap.   Wash thoroughly, paying special attention to the area where your surgery will be performed.  Thoroughly rinse your body with warm water from the neck down.  DO NOT shower/wash with your normal soap after using and rinsing off the CHG Soap.  Pat yourself dry with a CLEAN TOWEL.  Wear CLEAN PAJAMAS to bed the night before surgery  Place CLEAN SHEETS on your bed the night before your surgery  DO NOT SLEEP WITH PETS.   Day of Surgery:  Take a shower with CHG soap. Wear Clean/Comfortable clothing the morning of surgery Do not apply any deodorants/lotions.   Remember to brush your teeth WITH YOUR REGULAR TOOTHPASTE.   Please read over the following fact sheets that you were given.

## 2021-12-18 ENCOUNTER — Other Ambulatory Visit: Payer: Self-pay

## 2021-12-18 ENCOUNTER — Encounter (HOSPITAL_COMMUNITY): Payer: Self-pay

## 2021-12-18 ENCOUNTER — Encounter (HOSPITAL_COMMUNITY)
Admission: RE | Admit: 2021-12-18 | Discharge: 2021-12-18 | Disposition: A | Discharge status: Home / Self Care | Payer: Medicare Other | Source: Ambulatory Visit | Attending: Neurosurgery | Admitting: Neurosurgery

## 2021-12-18 VITALS — BP 160/100 | HR 73 | Temp 98.7°F | Resp 18 | Ht 76.0 in | Wt 281.5 lb

## 2021-12-18 DIAGNOSIS — Z20822 Contact with and (suspected) exposure to covid-19: Secondary | ICD-10-CM | POA: Insufficient documentation

## 2021-12-18 DIAGNOSIS — Z01818 Encounter for other preprocedural examination: Secondary | ICD-10-CM | POA: Diagnosis present

## 2021-12-18 DIAGNOSIS — I2510 Atherosclerotic heart disease of native coronary artery without angina pectoris: Secondary | ICD-10-CM | POA: Diagnosis not present

## 2021-12-18 LAB — BASIC METABOLIC PANEL
Anion gap: 12 (ref 5–15)
BUN: 16 mg/dL (ref 6–20)
CO2: 25 mmol/L (ref 22–32)
Calcium: 9.4 mg/dL (ref 8.9–10.3)
Chloride: 98 mmol/L (ref 98–111)
Creatinine, Ser: 1.12 mg/dL (ref 0.61–1.24)
GFR, Estimated: >60 mL/min (ref >60)
Glucose, Bld: 86 mg/dL (ref 70–99)
Potassium: 4 mmol/L (ref 3.5–5.1)
Sodium: 135 mmol/L (ref 135–145)

## 2021-12-18 LAB — CBC
HCT: 49.4 % (ref 39.0–52.0)
Hemoglobin: 16 g/dL (ref 13.0–17.0)
MCH: 27.4 pg (ref 26.0–34.0)
MCHC: 32.4 g/dL (ref 30.0–36.0)
MCV: 84.7 fL (ref 80.0–100.0)
Platelets: 221 10*3/uL (ref 150–400)
RBC: 5.83 MIL/uL — ABNORMAL HIGH (ref 4.22–5.81)
RDW: 13.9 % (ref 11.5–15.5)
WBC: 7.3 10*3/uL (ref 4.0–10.5)
nRBC: 0 % (ref 0.0–0.2)

## 2021-12-18 LAB — TYPE AND SCREEN
ABO/RH(D): O POS
Antibody Screen: NEGATIVE

## 2021-12-18 LAB — SURGICAL PCR SCREEN
MRSA, PCR: NEGATIVE
Staphylococcus aureus: NEGATIVE

## 2021-12-18 LAB — SARS CORONAVIRUS 2 (TAT 6-24 HRS): SARS Coronavirus 2: NEGATIVE

## 2021-12-18 NOTE — Progress Notes (Signed)
PCP - Loletha Grayer at [facility] in [place], [place] Cardiologist - denies  PPM/ICD - n/a   Chest x-ray - n/a EKG - 12/18/21 Stress Test -  ECHO - 05/26/17 Cardiac Cath -   Sleep Study - Done many years ago prior to 2010 per patient.  Mild OSA per patient. No follow up done. CPAP - Does not use CPAP  Fasting Blood Sugar - n/a Checks Blood Sugar _____ times a day- n/a  Blood Thinner Instructions: n/a Aspirin Instructions: n/a  ERAS Protcol - Yes PRE-SURGERY Ensure or G2- None ordered  COVID TEST- 12/18/21. Pending.    Anesthesia review: Yes. EKG review. Last office note requested from [place].  BP 160/100 upon arrival to PAT. Recheck at end of appointment = 152/103. Patient states he took his Norvasc and Losartan as he was leaving the house for his PAT appointment and hasn't taken his BP meds in the 2 days prior today. Lorretta Harp, PA made aware. Per Jeneen Rinks, patient instructed to take BP meds as prescribed and to monitor BP at home.   Patient denies shortness of breath, fever, cough and chest pain at PAT appointment   All instructions explained to the patient, with a verbal understanding of the material. Patient agrees to go over the instructions while at home for a better understanding. The opportunity to ask questions was provided.

## 2021-12-20 NOTE — Anesthesia Preprocedure Evaluation (Addendum)
Anesthesia Evaluation    Reviewed: Allergy & Precautions, Patient's Chart, lab work & pertinent test results, Unable to perform ROS - Chart review only  History of Anesthesia Complications (+) history of anesthetic complications  Airway Mallampati: III  TM Distance: >3 FB Neck ROM: Full    Dental  (+) Missing, Partial Upper, Dental Advisory Given, Chipped,    Pulmonary sleep apnea , Current Smoker, former smoker,    Pulmonary exam normal breath sounds clear to auscultation       Cardiovascular hypertension, Pt. on medications Normal cardiovascular exam Rhythm:Regular Rate:Normal  Echo 2018 - Left ventricle: SAM not clearly seen but small LVOT gradient. Thecavity size was normal. Wall thickness was increased in a patternof mild LVH. Systolic function was vigorous. The estimated ejection fraction was in the range of 65% to 70%. Leftventricular diastolic function parameters were normal.  - Left atrium: The atrium was mildly to moderately dilated.  - Atrial septum: No defect or patent foramen ovale was identified.    Neuro/Psych  Headaches, Hx closed head injury with residual memory issues.  Neuromuscular disease    GI/Hepatic Neg liver ROS, hiatal hernia, GERD  ,  Endo/Other  Hypothyroidism   Renal/GU Renal disease     Musculoskeletal  (+) Arthritis ,   Abdominal (+) + obese,   Peds  Hematology negative hematology ROS (+)   Anesthesia Other Findings   Reproductive/Obstetrics                            Lab Results  Component Value Date   WBC 7.3 12/18/2021   HGB 16.0 12/18/2021   HCT 49.4 12/18/2021   MCV 84.7 12/18/2021   PLT 221 12/18/2021   Lab Results  Component Value Date   CREATININE 1.12 12/18/2021   BUN 16 12/18/2021   NA 135 12/18/2021   K 4.0 12/18/2021   CL 98 12/18/2021   CO2 25 12/18/2021    Anesthesia Physical  Anesthesia Plan  ASA: 3  Anesthesia Plan: General    Post-op Pain Management:  Regional for Post-op pain and Minimal or no pain anticipated, Tylenol PO (pre-op), Ketamine IV and Dilaudid IV   Induction: Intravenous  PONV Risk Score and Plan: 3 and Ondansetron, Treatment may vary due to age or medical condition, Propofol infusion, Midazolam and Dexamethasone  Airway Management Planned: Oral ETT  Additional Equipment: None  Intra-op Plan:   Post-operative Plan: Extubation in OR  Informed Consent: I have reviewed the patients History and Physical, chart, labs and discussed the procedure including the risks, benefits and alternatives for the proposed anesthesia with the patient or authorized representative who has indicated his/her understanding and acceptance.     Dental advisory given  Plan Discussed with: CRNA  Anesthesia Plan Comments:        Anesthesia Quick Evaluation

## 2021-12-21 ENCOUNTER — Ambulatory Visit (HOSPITAL_COMMUNITY): Payer: Medicare Other | Admitting: Physician Assistant

## 2021-12-21 ENCOUNTER — Encounter (HOSPITAL_COMMUNITY): Payer: Self-pay | Admitting: Neurosurgery

## 2021-12-21 ENCOUNTER — Ambulatory Visit (HOSPITAL_COMMUNITY): Payer: Medicare Other

## 2021-12-21 ENCOUNTER — Other Ambulatory Visit: Payer: Self-pay

## 2021-12-21 ENCOUNTER — Encounter (HOSPITAL_COMMUNITY)
Admission: RE | Disposition: A | Discharge status: Home / Self Care | Payer: Self-pay | Source: Home / Self Care | Attending: Neurosurgery

## 2021-12-21 ENCOUNTER — Observation Stay (HOSPITAL_COMMUNITY)
Admission: RE | Admit: 2021-12-21 | Discharge: 2021-12-22 | Disposition: A | Discharge status: Home / Self Care | Payer: Medicare Other | Attending: Neurosurgery | Admitting: Neurosurgery

## 2021-12-21 DIAGNOSIS — M4802 Spinal stenosis, cervical region: Principal | ICD-10-CM | POA: Insufficient documentation

## 2021-12-21 DIAGNOSIS — G5601 Carpal tunnel syndrome, right upper limb: Secondary | ICD-10-CM | POA: Diagnosis not present

## 2021-12-21 DIAGNOSIS — Z96641 Presence of right artificial hip joint: Secondary | ICD-10-CM | POA: Diagnosis not present

## 2021-12-21 DIAGNOSIS — I10 Essential (primary) hypertension: Secondary | ICD-10-CM | POA: Insufficient documentation

## 2021-12-21 DIAGNOSIS — Z419 Encounter for procedure for purposes other than remedying health state, unspecified: Secondary | ICD-10-CM

## 2021-12-21 DIAGNOSIS — M4722 Other spondylosis with radiculopathy, cervical region: Secondary | ICD-10-CM | POA: Diagnosis not present

## 2021-12-21 DIAGNOSIS — E039 Hypothyroidism, unspecified: Secondary | ICD-10-CM | POA: Insufficient documentation

## 2021-12-21 DIAGNOSIS — Z96652 Presence of left artificial knee joint: Secondary | ICD-10-CM | POA: Insufficient documentation

## 2021-12-21 DIAGNOSIS — Z79899 Other long term (current) drug therapy: Secondary | ICD-10-CM | POA: Insufficient documentation

## 2021-12-21 DIAGNOSIS — Z87891 Personal history of nicotine dependence: Secondary | ICD-10-CM | POA: Diagnosis not present

## 2021-12-21 DIAGNOSIS — G4733 Obstructive sleep apnea (adult) (pediatric): Secondary | ICD-10-CM | POA: Insufficient documentation

## 2021-12-21 DIAGNOSIS — M5412 Radiculopathy, cervical region: Secondary | ICD-10-CM | POA: Diagnosis present

## 2021-12-21 HISTORY — PX: ANTERIOR CERVICAL DECOMP/DISCECTOMY FUSION: SHX1161

## 2021-12-21 HISTORY — PX: CARPAL TUNNEL RELEASE: SHX101

## 2021-12-21 SURGERY — ANTERIOR CERVICAL DECOMPRESSION/DISCECTOMY FUSION 1 LEVEL
Anesthesia: General | Laterality: Right

## 2021-12-21 MED ORDER — HYDROMORPHONE HCL 1 MG/ML IJ SOLN: 1.0000 mg | INTRAMUSCULAR | Status: DC | PRN

## 2021-12-21 MED ORDER — THROMBIN 5000 UNITS EX SOLR
CUTANEOUS | Status: AC
  Filled 2021-12-21: qty 10000

## 2021-12-21 MED ORDER — POLYETHYLENE GLYCOL 3350 17 G PO PACK: 17.0000 g | PACK | Freq: Every day | ORAL | Status: DC | PRN

## 2021-12-21 MED ORDER — PHENYLEPHRINE 40 MCG/ML (10ML) SYRINGE FOR IV PUSH (FOR BLOOD PRESSURE SUPPORT)
PREFILLED_SYRINGE | INTRAVENOUS | Status: AC
  Filled 2021-12-21: qty 10

## 2021-12-21 MED ORDER — ONDANSETRON HCL 4 MG/2ML IJ SOLN
INTRAMUSCULAR | Status: DC | PRN
Start: 2021-12-21 — End: 2021-12-21
  Administered 2021-12-21: 4 mg via INTRAVENOUS

## 2021-12-21 MED ORDER — CHLORHEXIDINE GLUCONATE CLOTH 2 % EX PADS: 6.0000 | MEDICATED_PAD | Freq: Once | CUTANEOUS | Status: DC

## 2021-12-21 MED ORDER — FENTANYL CITRATE (PF) 250 MCG/5ML IJ SOLN
INTRAMUSCULAR | Status: AC
  Filled 2021-12-21: qty 5

## 2021-12-21 MED ORDER — PHENYLEPHRINE 40 MCG/ML (10ML) SYRINGE FOR IV PUSH (FOR BLOOD PRESSURE SUPPORT)
PREFILLED_SYRINGE | INTRAVENOUS | Status: DC | PRN
  Administered 2021-12-21 (×2): 120 ug via INTRAVENOUS
  Administered 2021-12-21: 40 ug via INTRAVENOUS

## 2021-12-21 MED ORDER — HEMOSTATIC AGENTS (NO CHARGE) OPTIME
TOPICAL | Status: DC | PRN
  Administered 2021-12-21: 1 via TOPICAL

## 2021-12-21 MED ORDER — CYCLOBENZAPRINE HCL 10 MG PO TABS
10.0000 mg | ORAL_TABLET | Freq: Three times a day (TID) | ORAL | Status: DC | PRN
  Administered 2021-12-21 – 2021-12-22 (×2): 10 mg via ORAL
  Filled 2021-12-21: qty 1

## 2021-12-21 MED ORDER — DOCUSATE SODIUM 100 MG PO CAPS: 100.0000 mg | ORAL_CAPSULE | Freq: Two times a day (BID) | ORAL | Status: DC | PRN

## 2021-12-21 MED ORDER — ONDANSETRON HCL 4 MG/2ML IJ SOLN: 4.0000 mg | Freq: Four times a day (QID) | INTRAMUSCULAR | Status: DC | PRN

## 2021-12-21 MED ORDER — LIDOCAINE 2% (20 MG/ML) 5 ML SYRINGE
INTRAMUSCULAR | Status: DC | PRN
  Administered 2021-12-21: 100 mg via INTRAVENOUS

## 2021-12-21 MED ORDER — TRAZODONE HCL 50 MG PO TABS: 50.0000 mg | ORAL_TABLET | Freq: Every evening | ORAL | Status: DC | PRN

## 2021-12-21 MED ORDER — DEXAMETHASONE SODIUM PHOSPHATE 10 MG/ML IJ SOLN
INTRAMUSCULAR | Status: AC
  Filled 2021-12-21: qty 1

## 2021-12-21 MED ORDER — PROPOFOL 500 MG/50ML IV EMUL
INTRAVENOUS | Status: AC
  Filled 2021-12-21: qty 50

## 2021-12-21 MED ORDER — FENTANYL CITRATE (PF) 100 MCG/2ML IJ SOLN
INTRAMUSCULAR | Status: DC | PRN
  Administered 2021-12-21 (×2): 100 ug via INTRAVENOUS
  Administered 2021-12-21: 50 ug via INTRAVENOUS

## 2021-12-21 MED ORDER — HYDROCODONE-ACETAMINOPHEN 10-325 MG PO TABS
2.0000 | ORAL_TABLET | ORAL | Status: DC | PRN
  Administered 2021-12-21 – 2021-12-22 (×4): 2 via ORAL
  Filled 2021-12-21 (×4): qty 2

## 2021-12-21 MED ORDER — LACTATED RINGERS IV SOLN: INTRAVENOUS | Status: DC

## 2021-12-21 MED ORDER — SODIUM CHLORIDE 0.9 % IV SOLN
250.0000 mL | INTRAVENOUS | Status: DC
  Administered 2021-12-21: 250 mL via INTRAVENOUS

## 2021-12-21 MED ORDER — LIDOCAINE 2% (20 MG/ML) 5 ML SYRINGE
INTRAMUSCULAR | Status: AC
  Filled 2021-12-21: qty 5

## 2021-12-21 MED ORDER — HYDROMORPHONE HCL 1 MG/ML IJ SOLN
INTRAMUSCULAR | Status: AC
  Filled 2021-12-21: qty 1

## 2021-12-21 MED ORDER — CHLORHEXIDINE GLUCONATE 0.12 % MT SOLN: 15.0000 mL | Freq: Once | OROMUCOSAL | Status: AC

## 2021-12-21 MED ORDER — VITAMIN B-12 1000 MCG PO TABS
1000.0000 ug | ORAL_TABLET | Freq: Every day | ORAL | Status: DC
  Administered 2021-12-22: 1000 ug via ORAL
  Filled 2021-12-21: qty 1

## 2021-12-21 MED ORDER — SODIUM CHLORIDE 0.9% FLUSH: 3.0000 mL | INTRAVENOUS | Status: DC | PRN

## 2021-12-21 MED ORDER — KETAMINE HCL 50 MG/5ML IJ SOSY
PREFILLED_SYRINGE | INTRAMUSCULAR | Status: AC
  Filled 2021-12-21: qty 5

## 2021-12-21 MED ORDER — PROPOFOL 10 MG/ML IV BOLUS
INTRAVENOUS | Status: AC
  Filled 2021-12-21: qty 20

## 2021-12-21 MED ORDER — MEPERIDINE HCL 25 MG/ML IJ SOLN: 6.2500 mg | INTRAMUSCULAR | Status: DC | PRN

## 2021-12-21 MED ORDER — 0.9 % SODIUM CHLORIDE (POUR BTL) OPTIME
TOPICAL | Status: DC | PRN
  Administered 2021-12-21: 1000 mL

## 2021-12-21 MED ORDER — LIDOCAINE-EPINEPHRINE 1 %-1:100000 IJ SOLN
INTRAMUSCULAR | Status: AC
  Filled 2021-12-21: qty 1

## 2021-12-21 MED ORDER — HYDROCHLOROTHIAZIDE 12.5 MG PO TABS
12.5000 mg | ORAL_TABLET | Freq: Every day | ORAL | Status: DC
  Administered 2021-12-21 – 2021-12-22 (×2): 12.5 mg via ORAL
  Filled 2021-12-21 (×2): qty 1

## 2021-12-21 MED ORDER — MELOXICAM 7.5 MG PO TABS
15.0000 mg | ORAL_TABLET | Freq: Every day | ORAL | Status: DC
  Administered 2021-12-22: 15 mg via ORAL
  Filled 2021-12-21: qty 2

## 2021-12-21 MED ORDER — THROMBIN 5000 UNITS EX SOLR
CUTANEOUS | Status: DC | PRN
  Administered 2021-12-21: 10000 [IU] via TOPICAL

## 2021-12-21 MED ORDER — DEXAMETHASONE SODIUM PHOSPHATE 10 MG/ML IJ SOLN
INTRAMUSCULAR | Status: DC | PRN
  Administered 2021-12-21: 10 mg via INTRAVENOUS

## 2021-12-21 MED ORDER — MENTHOL 3 MG MT LOZG: 1.0000 | LOZENGE | OROMUCOSAL | Status: DC | PRN

## 2021-12-21 MED ORDER — PANTOPRAZOLE SODIUM 40 MG PO TBEC
40.0000 mg | DELAYED_RELEASE_TABLET | Freq: Every day | ORAL | Status: DC
  Administered 2021-12-22: 40 mg via ORAL
  Filled 2021-12-21: qty 1

## 2021-12-21 MED ORDER — CYCLOBENZAPRINE HCL 10 MG PO TABS
ORAL_TABLET | ORAL | Status: AC
  Filled 2021-12-21: qty 1

## 2021-12-21 MED ORDER — DULOXETINE HCL 60 MG PO CPEP
60.0000 mg | ORAL_CAPSULE | Freq: Every day | ORAL | Status: DC
  Administered 2021-12-21 – 2021-12-22 (×2): 60 mg via ORAL
  Filled 2021-12-21 (×2): qty 1

## 2021-12-21 MED ORDER — GABAPENTIN 300 MG PO CAPS
300.0000 mg | ORAL_CAPSULE | Freq: Two times a day (BID) | ORAL | Status: DC
  Administered 2021-12-21 – 2021-12-22 (×2): 300 mg via ORAL
  Filled 2021-12-21 (×2): qty 1

## 2021-12-21 MED ORDER — AMLODIPINE BESYLATE 10 MG PO TABS
10.0000 mg | ORAL_TABLET | Freq: Every day | ORAL | Status: DC
Start: 2021-12-22 — End: 2021-12-22
  Administered 2021-12-22: 10 mg via ORAL
  Filled 2021-12-21: qty 1

## 2021-12-21 MED ORDER — LEVOTHYROXINE SODIUM 137 MCG PO TABS
137.0000 ug | ORAL_TABLET | Freq: Every day | ORAL | Status: DC
  Administered 2021-12-22: 137 ug via ORAL
  Filled 2021-12-21: qty 1

## 2021-12-21 MED ORDER — CEFAZOLIN IN SODIUM CHLORIDE 3-0.9 GM/100ML-% IV SOLN
INTRAVENOUS | Status: AC
  Filled 2021-12-21: qty 100

## 2021-12-21 MED ORDER — HYDROMORPHONE HCL 1 MG/ML IJ SOLN
0.2500 mg | INTRAMUSCULAR | Status: DC | PRN
  Administered 2021-12-21 (×4): 0.5 mg via INTRAVENOUS

## 2021-12-21 MED ORDER — ONDANSETRON HCL 4 MG/2ML IJ SOLN
INTRAMUSCULAR | Status: AC
  Filled 2021-12-21: qty 2

## 2021-12-21 MED ORDER — PROPOFOL 500 MG/50ML IV EMUL
INTRAVENOUS | Status: DC | PRN
  Administered 2021-12-21: 25 ug/kg/min via INTRAVENOUS

## 2021-12-21 MED ORDER — KETAMINE HCL 10 MG/ML IJ SOLN
INTRAMUSCULAR | Status: DC | PRN
  Administered 2021-12-21: 50 mg via INTRAVENOUS

## 2021-12-21 MED ORDER — PHENOL 1.4 % MT LIQD: 1.0000 | OROMUCOSAL | Status: DC | PRN

## 2021-12-21 MED ORDER — LIDOCAINE-EPINEPHRINE 1 %-1:100000 IJ SOLN
INTRAMUSCULAR | Status: DC | PRN
  Administered 2021-12-21: 6 mL

## 2021-12-21 MED ORDER — CHLORHEXIDINE GLUCONATE 0.12 % MT SOLN
OROMUCOSAL | Status: AC
  Administered 2021-12-21: 15 mL via OROMUCOSAL
  Filled 2021-12-21: qty 15

## 2021-12-21 MED ORDER — PROPOFOL 10 MG/ML IV BOLUS
INTRAVENOUS | Status: DC | PRN
  Administered 2021-12-21: 200 mg via INTRAVENOUS

## 2021-12-21 MED ORDER — MIDAZOLAM HCL 2 MG/2ML IJ SOLN
INTRAMUSCULAR | Status: AC
  Filled 2021-12-21: qty 2

## 2021-12-21 MED ORDER — LOSARTAN POTASSIUM-HCTZ 100-12.5 MG PO TABS: 1.0000 | ORAL_TABLET | Freq: Every day | ORAL | Status: DC

## 2021-12-21 MED ORDER — ORAL CARE MOUTH RINSE: 15.0000 mL | Freq: Once | OROMUCOSAL | Status: AC

## 2021-12-21 MED ORDER — ACETAMINOPHEN 325 MG PO TABS: 650.0000 mg | ORAL_TABLET | ORAL | Status: DC | PRN

## 2021-12-21 MED ORDER — CEFAZOLIN IN SODIUM CHLORIDE 3-0.9 GM/100ML-% IV SOLN
3.0000 g | INTRAVENOUS | Status: AC
  Administered 2021-12-21: 3 g via INTRAVENOUS

## 2021-12-21 MED ORDER — SODIUM CHLORIDE 0.9% FLUSH: 3.0000 mL | Freq: Two times a day (BID) | INTRAVENOUS | Status: DC

## 2021-12-21 MED ORDER — ONDANSETRON HCL 4 MG PO TABS: 4.0000 mg | ORAL_TABLET | Freq: Four times a day (QID) | ORAL | Status: DC | PRN

## 2021-12-21 MED ORDER — LOSARTAN POTASSIUM 50 MG PO TABS
100.0000 mg | ORAL_TABLET | Freq: Every day | ORAL | Status: DC
  Administered 2021-12-21 – 2021-12-22 (×2): 100 mg via ORAL
  Filled 2021-12-21 (×2): qty 2

## 2021-12-21 MED ORDER — PROMETHAZINE HCL 25 MG/ML IJ SOLN: 6.2500 mg | INTRAMUSCULAR | Status: DC | PRN

## 2021-12-21 MED ORDER — ROCURONIUM BROMIDE 10 MG/ML (PF) SYRINGE
PREFILLED_SYRINGE | INTRAVENOUS | Status: AC
  Filled 2021-12-21: qty 10

## 2021-12-21 MED ORDER — HYDROCODONE-ACETAMINOPHEN 5-325 MG PO TABS: 1.0000 | ORAL_TABLET | ORAL | Status: DC | PRN

## 2021-12-21 MED ORDER — ACETAMINOPHEN 650 MG RE SUPP: 650.0000 mg | RECTAL | Status: DC | PRN

## 2021-12-21 MED ORDER — AMPHETAMINE-DEXTROAMPHETAMINE 20 MG PO TABS: 20.0000 mg | ORAL_TABLET | Freq: Every day | ORAL | Status: DC

## 2021-12-21 MED ORDER — ROCURONIUM BROMIDE 10 MG/ML (PF) SYRINGE
PREFILLED_SYRINGE | INTRAVENOUS | Status: DC | PRN
  Administered 2021-12-21: 70 mg via INTRAVENOUS
  Administered 2021-12-21: 20 mg via INTRAVENOUS

## 2021-12-21 MED ORDER — METHOCARBAMOL 500 MG PO TABS
500.0000 mg | ORAL_TABLET | Freq: Four times a day (QID) | ORAL | Status: DC | PRN
  Administered 2021-12-21 – 2021-12-22 (×2): 500 mg via ORAL
  Filled 2021-12-21 (×2): qty 1

## 2021-12-21 MED ORDER — CEFAZOLIN SODIUM-DEXTROSE 1-4 GM/50ML-% IV SOLN
1.0000 g | Freq: Three times a day (TID) | INTRAVENOUS | Status: AC
  Administered 2021-12-21 – 2021-12-22 (×2): 1 g via INTRAVENOUS
  Filled 2021-12-21 (×2): qty 50

## 2021-12-21 MED ORDER — MIDAZOLAM HCL 2 MG/2ML IJ SOLN
INTRAMUSCULAR | Status: DC | PRN
Start: 2021-12-21 — End: 2021-12-21
  Administered 2021-12-21: 2 mg via INTRAVENOUS

## 2021-12-21 MED ORDER — SUGAMMADEX SODIUM 200 MG/2ML IV SOLN
INTRAVENOUS | Status: DC | PRN
  Administered 2021-12-21: 200 mg via INTRAVENOUS

## 2021-12-21 SURGICAL SUPPLY — 75 items
BAG COUNTER SPONGE SURGICOUNT (BAG) ×6 IMPLANT
BAG DECANTER FOR FLEXI CONT (MISCELLANEOUS) ×4 IMPLANT
BAND RUBBER #18 3X1/16 STRL (MISCELLANEOUS) ×6 IMPLANT
BENZOIN TINCTURE PRP APPL 2/3 (GAUZE/BANDAGES/DRESSINGS) ×3 IMPLANT
BIT DRILL 13 (BIT) ×1 IMPLANT
BLADE SURG 15 STRL LF DISP TIS (BLADE) ×2 IMPLANT
BLADE SURG 15 STRL SS (BLADE) ×3
BNDG ELASTIC 3X5.8 VLCR STR LF (GAUZE/BANDAGES/DRESSINGS) ×3 IMPLANT
BNDG ELASTIC 4X5.8 VLCR STR LF (GAUZE/BANDAGES/DRESSINGS) ×1 IMPLANT
BNDG GAUZE ELAST 4 BULKY (GAUZE/BANDAGES/DRESSINGS) IMPLANT
BNDG STRETCH 4X75 STRL LF (GAUZE/BANDAGES/DRESSINGS) IMPLANT
BOWL CEMENT MIX W SPATULA BONE (MISCELLANEOUS) ×1 IMPLANT
BUR MATCHSTICK NEURO 3.0 LAGG (BURR) ×3 IMPLANT
CABLE BIPOLOR RESECTION CORD (MISCELLANEOUS) ×3 IMPLANT
CAGE PEEK 7X14X11 (Cage) ×3 IMPLANT
CANISTER SUCT 3000ML PPV (MISCELLANEOUS) ×5 IMPLANT
CARTRIDGE OIL MAESTRO DRILL (MISCELLANEOUS) ×4 IMPLANT
DIFFUSER DRILL AIR PNEUMATIC (MISCELLANEOUS) ×5 IMPLANT
DRAPE C-ARM 42X72 X-RAY (DRAPES) ×6 IMPLANT
DRAPE EXTREMITY T 121X128X90 (DISPOSABLE) ×3 IMPLANT
DRAPE HALF SHEET 40X57 (DRAPES) ×3 IMPLANT
DRAPE LAPAROTOMY 100X72 PEDS (DRAPES) ×3 IMPLANT
DRAPE MICROSCOPE LEICA (MISCELLANEOUS) ×3 IMPLANT
DURAPREP 6ML APPLICATOR 50/CS (WOUND CARE) ×3 IMPLANT
ELECT COATED BLADE 2.86 ST (ELECTRODE) ×3 IMPLANT
ELECT REM PT RETURN 9FT ADLT (ELECTROSURGICAL) ×3
ELECTRODE REM PT RTRN 9FT ADLT (ELECTROSURGICAL) ×2 IMPLANT
GAUZE 4X4 16PLY ~~LOC~~+RFID DBL (SPONGE) ×3 IMPLANT
GAUZE SPONGE 4X4 12PLY STRL (GAUZE/BANDAGES/DRESSINGS) ×4 IMPLANT
GAUZE SPONGE 4X4 12PLY STRL LF (GAUZE/BANDAGES/DRESSINGS) ×2 IMPLANT
GLOVE EXAM NITRILE XL STR (GLOVE) IMPLANT
GLOVE SURG LTX SZ9 (GLOVE) ×6 IMPLANT
GOWN STRL REUS W/ TWL LRG LVL3 (GOWN DISPOSABLE) IMPLANT
GOWN STRL REUS W/ TWL XL LVL3 (GOWN DISPOSABLE) IMPLANT
GOWN STRL REUS W/TWL 2XL LVL3 (GOWN DISPOSABLE) IMPLANT
GOWN STRL REUS W/TWL LRG LVL3 (GOWN DISPOSABLE)
GOWN STRL REUS W/TWL XL LVL3 (GOWN DISPOSABLE)
HALTER HD/CHIN CERV TRACTION D (MISCELLANEOUS) ×3 IMPLANT
HEMOSTAT POWDER KIT SURGIFOAM (HEMOSTASIS) IMPLANT
KIT BASIN OR (CUSTOM PROCEDURE TRAY) ×5 IMPLANT
KIT TURNOVER KIT B (KITS) ×5 IMPLANT
MARKER SKIN DUAL TIP RULER LAB (MISCELLANEOUS) ×1 IMPLANT
NDL HYPO 25X1 1.5 SAFETY (NEEDLE) ×2 IMPLANT
NDL SPNL 20GX3.5 QUINCKE YW (NEEDLE) ×2 IMPLANT
NEEDLE HYPO 25X1 1.5 SAFETY (NEEDLE) ×3 IMPLANT
NEEDLE SPNL 20GX3.5 QUINCKE YW (NEEDLE) ×3 IMPLANT
NS IRRIG 1000ML POUR BTL (IV SOLUTION) ×5 IMPLANT
OIL CARTRIDGE MAESTRO DRILL (MISCELLANEOUS) ×3
PACK BASIC III (CUSTOM PROCEDURE TRAY) ×3
PACK LAMINECTOMY NEURO (CUSTOM PROCEDURE TRAY) ×3 IMPLANT
PACK SRG BSC III STRL LF ECLPS (CUSTOM PROCEDURE TRAY) IMPLANT
PACK SURGICAL SETUP 50X90 (CUSTOM PROCEDURE TRAY) ×2 IMPLANT
PAD ARMBOARD 7.5X6 YLW CONV (MISCELLANEOUS) ×9 IMPLANT
PLATE ELITE VISION 25MM (Plate) ×1 IMPLANT
SCREW ST 13X4XST VA NS SPNE (Screw) IMPLANT
SCREW ST VAR 4 ATL (Screw) ×12 IMPLANT
SPACER SPNL 11X14X7XPEEK CVD (Cage) IMPLANT
SPCR SPNL 11X14X7XPEEK CVD (Cage) ×2 IMPLANT
SPONGE INTESTINAL PEANUT (DISPOSABLE) ×3 IMPLANT
SPONGE SURGIFOAM ABS GEL SZ50 (HEMOSTASIS) ×3 IMPLANT
STOCKINETTE 4X48 STRL (DRAPES) ×3 IMPLANT
STRIP CLOSURE SKIN 1/2X4 (GAUZE/BANDAGES/DRESSINGS) ×3 IMPLANT
SUT ETHILON 4 0 PS 2 18 (SUTURE) ×3 IMPLANT
SUT VIC AB 3-0 SH 8-18 (SUTURE) ×4 IMPLANT
SUT VIC AB 4-0 RB1 18 (SUTURE) ×3 IMPLANT
SUT VICRYL 4-0 PS2 18IN ABS (SUTURE) ×3 IMPLANT
SYR BULB EAR ULCER 3OZ GRN STR (SYRINGE) ×3 IMPLANT
SYR CONTROL 10ML LL (SYRINGE) ×3 IMPLANT
TAPE CLOTH 4X10 WHT NS (GAUZE/BANDAGES/DRESSINGS) ×3 IMPLANT
TAPE PAPER 3X10 WHT MICROPORE (GAUZE/BANDAGES/DRESSINGS) ×1 IMPLANT
TOWEL GREEN STERILE (TOWEL DISPOSABLE) ×5 IMPLANT
TOWEL GREEN STERILE FF (TOWEL DISPOSABLE) ×5 IMPLANT
TRAP SPECIMEN MUCUS 40CC (MISCELLANEOUS) ×3 IMPLANT
UNDERPAD 30X36 HEAVY ABSORB (UNDERPADS AND DIAPERS) ×3 IMPLANT
WATER STERILE IRR 1000ML POUR (IV SOLUTION) ×5 IMPLANT

## 2021-12-21 NOTE — Anesthesia Postprocedure Evaluation (Signed)
Anesthesia Post Note  Patient: Douglas Wiggins  Procedure(s) Performed: CERVICAL FIVE-SIX ANTERIOR CERVICAL DISCECTOMY FUSION Right CARPAL TUNNEL RELEASE (Right)     Patient location during evaluation: PACU Anesthesia Type: General Level of consciousness: sedated and patient cooperative Pain management: pain level controlled Vital Signs Assessment: post-procedure vital signs reviewed and stable Respiratory status: spontaneous breathing Cardiovascular status: stable Anesthetic complications: no   No notable events documented.  Last Vitals:  Vitals:   12/21/21 1546 12/21/21 1601  BP: (!) 156/97 (!) 160/88  Pulse: 81 88  Resp:    Temp:  (!) 36.4 C  SpO2: 94% 97%    Last Pain:  Vitals:   12/21/21 1546  TempSrc:   PainSc: [place]

## 2021-12-21 NOTE — Op Note (Signed)
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  Asst. Surgeon: Reinaldo Meeker, NP  Anesthesia: General  Indication: 56 year old male with severe right upper extremity pain paresthesias and weakness consistent with a right-sided C6 radiculopathy but also with elements of a significant right-sided carpal tunnel syndrome.  Work-up demonstrates evidence of severe spondylosis with a large osteophytic projection on the right at C5-6 with severe C6 nerve root compression.  Patient has failed conservative management presents now for anterior cervical discectomy and fusion in hopes improving his symptoms.  Patient also has significant median nerve dysfunction with significant with thenar wasting and EMGs and nerve conduction studies demonstrating severe carpal tunnel syndrome.  Patient presents for coincidental right-sided carpal tunnel release  Operative note: After induction of anesthesia, patient positioned supine with neck slightly extended held placed halter traction.  Patient's anterior cervical region prepped and draped sterilely.  Incision made overlying C5-6.  Dissection from the right.  Retractor placed.  X-ray taken.  Level confirmed.  Disc base incised.  Anterior osteophytes removed.  Discectomy performed using various instruments down to level of posterior annulus.  Microscope brought into the field used throughout the remainder of the discectomy.  Remaining aspects of annulus and osteophytes were removed using high-speed drill down to level the posterior logical ligament.  Posterior margin was elevated and  resected using Kerrison rongeurs.  Underlying thecal sac was identified.  Wide central decompression then performed undercutting the bodies of C5 and C6.  Decompression then proceeded each neural foramina.  Wide anterior foraminotomies were performed along the course the exiting C6 nerve roots bilaterally.  At this point a very thorough decompression achieved.  There was no evidence of injury to thecal sac or nerve roots.  The wound is then irrigated.  Gelfoam was placed topically for hemostasis then removed.  7 mm Medtronic anatomic peek cage packed with local harvested autograft then impacted in place and recessed slightly from the anterior cortical margin.  Medtronic anterior cervical plates and placed over the C5 and C6 levels.  This then attached under fluoroscopic guidance using 13 mm variable angle screws to each of both levels.  All 4 screws given final tightening found to be solid within bone.  Locking screws engaged at both levels.  Final images reveal good fusion cage with hardware at the proper upper level with normal alignment of spine.  Wound is then irrigated.  Hemostasis was assured.  Wounds then closed in layers with Vicryl sutures.  Patient remains in the operating room preparing for right-sided carpal tunnel release.  Patient was repositioned on the bed with his right upper extremity outstretched.  His right upper extremity was prepped and draped sterilely.  A linear incision was made just distal to the distal wrist crease along the medial palmar .  This carried down sharply through the palmar fascia.  Transverse carpal ligament was identified.  This was then incised.  The median nerve was identified.  The median nerve was then protected using a freer elevator.  The transverse carpal ligament was then divided distally and then proximally into the distal forearm.  At this point a very thorough decompression of the median nerve and been achieved.  There was no evidence of  injury to the median  nerve or any of its branches.  Wound is then irrigated.  Then closed in a typical fashion.  Sterile dressing was applied.  No apparent complications.  Patient tolerated the procedure well and he returns to the recovery room postop.

## 2021-12-21 NOTE — H&P (Signed)
Douglas Wiggins is an 56 y.o. male.   Chief Complaint: Right arm pain and numbness HPI: 56 year old male with severe right upper extremity pain numbness and paresthesias with some weakness.  Work-up demonstrates evidence of severe spondylosis and severe foraminal stenosis on the right at C5-6 and coincidental very severe right-sided carpal tunnel syndrome.  Patient presents now for C5-6 anterior cervical discectomy and fusion and right-sided carpal tunnel release in hopes of improving his symptoms.  Past Medical History:  Diagnosis Date   ADHD    Chronic lower back pain    Complication of anesthesia    Pt reports that he 'hiccups' 2 days following administration of anesthesia   Depression    Diverticulosis of colon    GERD (gastroesophageal reflux disease)    Gout    per pt last flare-up 2018   Hiatal hernia    History of closed head injury 1992   traumatic---  per pt in coma few days,  residual memory issues   History of diverticulitis of colon    s/p  colon resection for sigmoid perforation 01-06-2012   History of sepsis 05/2017   Hyperlipidemia    Hypertension    Hypothyroidism    Lichen planus    Memory difficulty    seconday traumatic head injury 1992   Migraines    Numbness and tingling of right arm    due to  cervical nerve damage   OA (osteoarthritis)    right hip,  back, knees, ankles, neck   OSA (obstructive sleep apnea)    does not wear cpap/states MILD- no sleep study in years   Sciatica, right side    Wears glasses    Wears partial dentures    upper     Past Surgical History:  Procedure Laterality Date   ANKLE ARTHROSCOPY Right 05-20-2003   dr graves @[facility]    APPLICATION OF WOUND VAC  10/29/2012   Procedure: APPLICATION OF WOUND VAC;  Surgeon: Imogene Burn. Georgette Dover, MD;  Location: [facility];  Service: General;;   CARPAL TUNNEL RELEASE Left 1990's   COLON RESECTION  10/26/2012   Procedure: COLON RESECTION;  Surgeon: Imogene Burn. Georgette Dover, MD;  Location: [place];  Service:  General;  Laterality: N/A;  Partial colon resection with anastomosis   COLOSTOMY CLOSURE  04/13/2012   Procedure: COLOSTOMY CLOSURE;  Surgeon: Imogene Burn. Georgette Dover, MD;  Location: [facility];  Service: General;  Laterality: N/A;   COLOSTOMY REVISION  01/06/2012   Procedure: COLON RESECTION SIGMOID;  Surgeon: Imogene Burn. Georgette Dover, MD;  Location: [facility];  Service: General;  Laterality: N/A;  with hartmans procedure   HAND SURGERY Left 08-02-2009   dr Burney Gauze @[facility]    repair left fifth digit crush injury   INSERTION OF MESH N/A 11/10/2013   Procedure: INSERTION OF MESH;  Surgeon: Imogene Burn. Georgette Dover, MD;  Location: [facility];  Service: General;  Laterality: N/A;   KNEE ARTHROSCOPY Right 1990's   LAPAROTOMY  10/26/2012   Procedure: EXPLORATORY LAPAROTOMY;  Surgeon: Imogene Burn. Georgette Dover, MD;  Location: [facility];  Service: General;  Laterality: N/A;   LAPAROTOMY  10/27/2012   Procedure: EXPLORATORY LAPAROTOMY;  Surgeon: Zenovia Jarred, MD;  Location: [facility];  Service: General;  Laterality: N/A;   LAPAROTOMY  10/29/2012   Procedure: EXPLORATORY LAPAROTOMY;  Surgeon: Imogene Burn. Georgette Dover, MD;  Location: [facility];  Service: General;  Laterality: N/A;  exploratory laparotomy, partial closure of abdominal wound,     SHOULDER ARTHROSCOPY Right 10-06-2008  dr supple  @[facility]   TOTAL HIP ARTHROPLASTY Right 01/19/2019   Procedure: TOTAL HIP ARTHROPLASTY ANTERIOR APPROACH;  Surgeon: [name], MD;  Location: [facility];  Service: Orthopedics;  Laterality: Right;  70 mins   TOTAL KNEE ARTHROPLASTY Left 05/25/2019   Procedure: TOTAL KNEE ARTHROPLASTY;  Surgeon: [name], MD;  Location: [facility];  Service: Orthopedics;  Laterality: Left;  1mins   VENTRAL HERNIA REPAIR  10/26/2012   Procedure: HERNIA REPAIR VENTRAL ADULT;  Surgeon: Imogene Burn. Georgette Dover, MD;  Location: [facility];  Service: General;  Laterality: N/A;  Primary Repair Ventral Hernia   VENTRAL HERNIA REPAIR N/A 11/10/2013   Procedure: LAPAROSCOPIC VENTRAL HERNIA ;  Surgeon: Imogene Burn. Georgette Dover,  MD;  Location: [facility];  Service: General;  Laterality: N/A;    Family History  Problem Relation Age of Onset   Heart disease Mother    Lung cancer Father    Social History:  reports that he has quit smoking. His smoking use included cigarettes. He quit smokeless tobacco use about 32 years ago.  His smokeless tobacco use included chew. He reports that he does not currently use drugs. He reports that he does not drink alcohol.  Allergies:  Allergies  Allergen Reactions   Sulfa Antibiotics Hives and Rash    Medications Prior to Admission  Medication Sig Dispense Refill   amLODipine (NORVASC) 10 MG tablet Take 10 mg by mouth daily.     amphetamine-dextroamphetamine (ADDERALL) 20 MG tablet Take 20 mg by mouth daily.      cyclobenzaprine (FLEXERIL) 10 MG tablet Take 10 mg by mouth 3 (three) times daily as needed for muscle spasms.     DULoxetine (CYMBALTA) 60 MG capsule Take 60 mg by mouth daily.     gabapentin (NEURONTIN) 300 MG capsule Take 300 mg by mouth 2 (two) times daily.     levothyroxine (SYNTHROID) 137 MCG tablet Take 137 mcg by mouth daily before breakfast.     losartan-hydrochlorothiazide (HYZAAR) 100-12.5 MG tablet Take 1 tablet by mouth daily.     meloxicam (MOBIC) 15 MG tablet Take 15 mg by mouth daily.     methocarbamol (ROBAXIN) 500 MG tablet Take 1 tablet (500 mg total) by mouth every 6 (six) hours as needed for muscle spasms. 40 tablet 0   pantoprazole (PROTONIX) 40 MG tablet Take 40 mg by mouth daily.     polyethylene glycol (MIRALAX / GLYCOLAX) 17 g packet Take 17 g by mouth 2 (two) times daily. (Patient taking differently: Take 17 g by mouth daily as needed for mild constipation.) 28 packet 0   traZODone (DESYREL) 50 MG tablet Take 50 mg by mouth at bedtime as needed for sleep.     vitamin B-12 (CYANOCOBALAMIN) 1000 MCG tablet Take 1 tablet (1,000 mcg total) by mouth daily. 90 tablet 0   docusate sodium (COLACE) 100 MG capsule Take 1 capsule (100 mg total) by mouth 2  (two) times daily. (Patient taking differently: Take 100 mg by mouth 2 (two) times daily as needed for mild constipation.) 28 capsule 0    No results found for this or any previous visit (from the past 48 hour(s)). No results found.  Pertinent items noted in HPI and remainder of comprehensive ROS otherwise negative.  Blood pressure (!) 158/94, pulse 64, temperature 98.4 F (36.9 C), temperature source Oral, resp. rate 18, height 6\' 4"  (1.93 m), weight 127.5 kg, SpO2 100 %.  Patient is awake and alert.  He is oriented and appropriate.  Speech is fluent.  Judgment insight are  intact.  Cranial nerve function normal bilateral.  Motor examination reveals mild weakness of his right thenar muscles and right wrist extensors and brachioradialis.  Remainder his motor exam intact.  Sensory examination with decrease sensation pinprick light touch in his right median nerve distribution more probably in his right C6 nerve root distribution.  Reflexes are normal active.  No evidence of long track signs.  Gait is normal peer examination head ears eyes nose and throat is unremarkable chest and abdomen are benign.  Extremities are free from injury or deformity. 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  Plan right carpal tunnel release and C5-6 anterior cervical discectomy with interbody fusion utilizing interbody cage, local harvested autograft, and anterior plate instrumentation.  Risks and benefits been explained.  Patient wishes to proceed.  Cooper Render Bern Fare 12/21/2021, 12:14 PM

## 2021-12-21 NOTE — Anesthesia Procedure Notes (Signed)
Procedure Name: Intubation Date/Time: 12/21/2021 12:35 PM Performed by: Moshe Salisbury, CRNA Pre-anesthesia Checklist: Patient identified, Emergency Drugs available, Suction available and Patient being monitored Patient Re-evaluated:Patient Re-evaluated prior to induction Oxygen Delivery Method: Circle System Utilized Preoxygenation: Pre-oxygenation with 100% oxygen Induction Type: IV induction Ventilation: Mask ventilation without difficulty Laryngoscope Size: Mac and 4 Grade View: Grade II Tube type: Oral Tube size: 7.5 mm Number of attempts: 1 Airway Equipment and Method: Stylet and Oral airway Placement Confirmation: ETT inserted through vocal cords under direct vision, positive ETCO2 and breath sounds checked- equal and bilateral Secured at: 25 cm Tube secured with: Tape Dental Injury: Teeth and Oropharynx as per pre-operative assessment

## 2021-12-21 NOTE — Transfer of Care (Signed)
Immediate Anesthesia Transfer of Care Note  Patient: Douglas Wiggins  Procedure(s) Performed: CERVICAL FIVE-SIX ANTERIOR CERVICAL DISCECTOMY FUSION Right CARPAL TUNNEL RELEASE (Right)  Patient Location: PACU  Anesthesia Type:General  Level of Consciousness: drowsy and patient cooperative  Airway & Oxygen Therapy: Patient Spontanous Breathing and Patient connected to nasal cannula oxygen  Post-op Assessment: Report given to RN, Post -op Vital signs reviewed and stable and Patient moving all extremities  Post vital signs: Reviewed and stable  Last Vitals:  Vitals Value Taken Time  BP 130/83 12/21/21 1440  Temp    Pulse 94 12/21/21 1440  Resp 20 12/21/21 1440  SpO2 91 % 12/21/21 1440  Vitals shown include unvalidated device data.  Last Pain:  Vitals:   12/21/21 0825  TempSrc:   PainSc: 0-No pain      Patients Stated Pain Goal: 2 (11/64/35 3912)  Complications: No notable events documented.

## 2021-12-21 NOTE — Progress Notes (Signed)
Orthopedic Tech Progress Note Patient Details:  Douglas Wiggins 1966-02-09 [id number]  Ortho Devices Type of Ortho Device: Soft collar Ortho Device/Splint Interventions: Ordered, Application, Adjustment   Post Interventions Patient Tolerated: Well  Charline Bills [name] 12/21/2021, 4:00 PM Delivered and assisted with applying cervical collar.

## 2021-12-21 NOTE — Brief Op Note (Signed)
12/21/2021  2:30 PM  PATIENT:  Douglas Wiggins  56 y.o. male  PRE-OPERATIVE DIAGNOSIS:  Cervical radiculitis, Carpal tunnel syndrome  POST-OPERATIVE DIAGNOSIS:  Cervical radiculitis, Carpal tunnel syndrome  PROCEDURE:  Procedure(s) with comments: CERVICAL FIVE-SIX ANTERIOR CERVICAL DISCECTOMY FUSION (N/A) Right CARPAL TUNNEL RELEASE (Right) - 3C/RM 18  SURGEON:  Surgeon(s) and Role:    Earnie Larsson, MD - Primary  PHYSICIAN ASSISTANT:   ASSISTANTSMearl Latin   ANESTHESIA:   general  EBL:  100 mL   BLOOD ADMINISTERED:none  DRAINS: none   LOCAL MEDICATIONS USED:  LIDOCAINE   SPECIMEN:  No Specimen  DISPOSITION OF SPECIMEN:  N/A  COUNTS:  YES  TOURNIQUET:  * No tourniquets in log *  DICTATION: .Dragon Dictation  PLAN OF CARE: Admit for overnight observation  PATIENT DISPOSITION:  PACU - hemodynamically stable.   Delay start of Pharmacological VTE agent (>24hrs) due to surgical blood loss or risk of bleeding: yes

## 2021-12-22 DIAGNOSIS — M4802 Spinal stenosis, cervical region: Secondary | ICD-10-CM | POA: Diagnosis not present

## 2021-12-22 MED ORDER — HYDROCODONE-ACETAMINOPHEN 10-325 MG PO TABS: 1.0000 | ORAL_TABLET | ORAL | 0 refills | Status: DC | PRN

## 2021-12-22 NOTE — Discharge Instructions (Signed)
Wound Care Keep incision area dry.  You may remove outer bandage after 2 days and shower.   If you shower prior cover incision with plastic wrap.  Do not put any creams, lotions, or ointments on incision. Leave steri-strips on neck.  They will fall off by themselves. Activity Walk each and every day, increasing distance each day. No lifting greater than 5 lbs.  Avoid excessive neck motion. No driving for 2 weeks; may ride as a passenger locally. Wear neck brace at all times except when showering. Diet Resume your normal diet.  Return to Work Will be discussed at you follow up appointment. Call Your Doctor If Any of These Occur Redness, drainage, or swelling at the wound.  Temperature greater than 101 degrees. Severe pain not relieved by pain medication. Increased difficulty swallowing.  Incision starts to come apart. Follow Up Appt Call [phone]) or for problems.  If you have any hardware placed in your spine, you will need an x-ray before your appointment.

## 2021-12-22 NOTE — Evaluation (Signed)
Occupational Therapy Evaluation Patient Details Name: Douglas Wiggins MRN: [id number] DOB: Apr 05, 1966 Today's Date: 12/22/2021   History of Present Illness Pt is a 56 y/o male s/p C5-6 ACDF and R carpal tunnel release. PMH includes gout, HTN, R THA, and L TKA.   Clinical Impression   Patient admitted for the procedure above.  Patient is at his baseline for ADL and in room mobility.  Per his mother, he is impulsive at baseline.  Tendon gliding exercises demonstrated with teach back given.  Sheet issued.  No further OT needs in the acute setting.        Recommendations for follow up therapy are one component of a multi-disciplinary discharge planning process, led by the attending physician.  Recommendations may be updated based on patient status, additional functional criteria and insurance authorization.   Follow Up Recommendations  Follow physician's recommendations for discharge plan and follow up therapies    Assistance Recommended at Discharge None  Patient can return home with the following      Functional Status Assessment  Patient has not had a recent decline in their functional status  Equipment Recommendations  None recommended by OT    Recommendations for Other Services       Precautions / Restrictions Precautions Precautions: Cervical Precaution Booklet Issued: Yes (comment) Precaution Comments: Reinforced Required Braces or Orthoses: Cervical Brace;Other Brace Cervical Brace: For comfort Other Brace: ACE wrap to R hand post CTR Restrictions Weight Bearing Restrictions: Yes RUE Weight Bearing: Partial weight bearing RUE Partial Weight Bearing Percentage or Pounds: advised patient not to grip hard, or lift something over 7 pounds.      Mobility Bed Mobility Overal bed mobility: Independent                  Transfers Overall transfer level: Independent                        Balance Overall balance assessment: No apparent balance deficits  (not formally assessed)                                         ADL either performed or assessed with clinical judgement   ADL Overall ADL's : At baseline                                             Vision Baseline Vision/History: 0 No visual deficits Patient Visual Report: No change from baseline       Perception Perception Perception: Within Functional Limits   Praxis Praxis Praxis: Intact    Pertinent Vitals/Pain Pain Assessment Pain Assessment: Faces Faces Pain Scale: Hurts little more Pain Location: neck Pain Descriptors / Indicators: Grimacing, Guarding Pain Intervention(s): Monitored during session     Hand Dominance Right   Extremity/Trunk Assessment Upper Extremity Assessment Upper Extremity Assessment: RUE deficits/detail RUE Deficits / Details: CTR RUE Sensation: decreased light touch RUE Coordination: WNL   Lower Extremity Assessment Lower Extremity Assessment: Defer to PT evaluation   Cervical / Trunk Assessment Cervical / Trunk Assessment: Neck Surgery   Communication Communication Communication: No difficulties   Cognition Arousal/Alertness: Awake/alert Behavior During Therapy: Impulsive Overall Cognitive Status: Within Functional Limits for tasks assessed  General Comments   VSS on RA    Exercises Other Exercises Other Exercises: Tendon gliding exercises.   Shoulder Instructions      Home Living Family/patient expects to be discharged to:: Private residence Living Arrangements: Alone Available Help at Discharge: Family;Available PRN/intermittently Type of Home: House Home Access: Stairs to enter [employer] of Steps: 1 Entrance Stairs-Rails: Left Home Layout: One level     Bathroom Shower/Tub: Teacher, early years/pre: Standard     Home Equipment: None          Prior Functioning/Environment Prior Level of  Function : Independent/Modified Independent                        OT Problem List: Decreased safety awareness      OT Treatment/Interventions:      OT Goals(Current goals can be found in the care plan section) Acute Rehab OT Goals Patient Stated Goal: return home OT Goal Formulation: With patient Time For Goal Achievement: 12/24/21 Potential to Achieve Goals: Good  OT Frequency:      Co-evaluation              AM-PAC OT "6 Clicks" Daily Activity     Outcome Measure Help from another person eating meals?: None Help from another person taking care of personal grooming?: None Help from another person toileting, which includes using toliet, bedpan, or urinal?: None Help from another person bathing (including washing, rinsing, drying)?: None Help from another person to put on and taking off regular upper body clothing?: None Help from another person to put on and taking off regular lower body clothing?: None 6 Click Score: 24   End of Session Equipment Utilized During Treatment: Cervical collar  Activity Tolerance: Patient tolerated treatment well Patient left: in chair;with call bell/phone within reach;with nursing/sitter in room                   Time: 8889-1694 OT Time Calculation (min): 22 min Charges:  OT General Charges $OT Visit: 1 Visit OT Evaluation $OT Eval Moderate Complexity: 1 Mod  12/22/2021  RP, OTR/L  Acute Rehabilitation Services  Office:  [phone]   Metta Clines 12/22/2021, 9:46 AM

## 2021-12-22 NOTE — Progress Notes (Signed)
Patient alert and oriented, mae's well, voiding adequate amount of urine, swallowing without difficulty, no c/o pain at time of discharge. Patient discharged home with family. Script and discharged instructions given to patient. Patient and family stated understanding of instructions given. Patient has an appointment with Dr. Pool  

## 2021-12-22 NOTE — Discharge Summary (Signed)
Physician Discharge Summary  Patient ID: Douglas Wiggins MRN: [id number] DOB/AGE: 12-16-1965 56 y.o.  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 male that underwent elective ACDF C5-6 for stenosis and radiculopathy and right carpal tunnel release.  He tolerated surgery well.  Postoperatively he was ambulating well, tolerating normal diet.  His preoperative symptoms were improved.  His incisions were clean dry and intact.  Pain was controlled on oral medication, having normal bowel bladder function.  Treatments: Surgery -ACDF C5-6, right carpal tunnel release on 12/21/2021  Discharge Exam: Blood pressure (!) 128/93, pulse 89, temperature 98.7 F (37.1 C), temperature source Oral, resp. rate 18, height 6\' 4"  (1.93 m), weight 127.5 kg, SpO2 96 %. Awake, alert, oriented x3 PERRLA Speech fluent, appropriate CN grossly intact 5/5 BUE/BLE Wounds c/d/i   Disposition: Discharge disposition: 01-Home or Self Care        Allergies as of 12/22/2021       Reactions   Sulfa Antibiotics Hives, Rash        Medication List     STOP taking these medications    meloxicam 15 MG tablet Commonly known as: MOBIC       TAKE these medications    amLODipine 10 MG tablet Commonly known as: NORVASC Take 10 mg by mouth daily.   amphetamine-dextroamphetamine 20 MG tablet Commonly known as: ADDERALL Take 20 mg by mouth daily.   cyclobenzaprine 10 MG tablet Commonly known as: FLEXERIL Take 10 mg by mouth 3 (three) times daily as needed for muscle spasms.   docusate sodium 100 MG capsule Commonly known as: Colace Take 1 capsule (100 mg total) by mouth 2 (two) times daily. What changed:  when to take  this reasons to take this   DULoxetine 60 MG capsule Commonly known as: CYMBALTA Take 60 mg by mouth daily.   gabapentin 300 MG capsule Commonly known as: NEURONTIN Take 300 mg by mouth 2 (two) times daily.   HYDROcodone-acetaminophen 10-325 MG tablet Commonly known as: NORCO Take 1-2 tablets by mouth every 4 (four) hours as needed for severe pain ((score 7 to 10)).   levothyroxine 137 MCG tablet Commonly known as: SYNTHROID Take 137 mcg by mouth daily before breakfast.   losartan-hydrochlorothiazide 100-12.5 MG tablet Commonly known as: HYZAAR Take 1 tablet by mouth daily.   methocarbamol 500 MG tablet Commonly known as: Robaxin Take 1 tablet (500 mg total) by mouth every 6 (six) hours as needed for muscle spasms.   pantoprazole 40 MG tablet Commonly known as: PROTONIX Take 40 mg by mouth daily.   polyethylene glycol 17 g packet Commonly known as: MIRALAX / GLYCOLAX Take 17 g by mouth 2 (two) times daily. What changed:  when to take this reasons to take this   traZODone 50 MG tablet Commonly known as: DESYREL Take 50 mg by mouth at bedtime as needed for sleep.   vitamin B-12 1000 MCG tablet Commonly known as: CYANOCOBALAMIN Take 1 tablet (1,000 mcg total) by mouth daily.        Follow-up Information     Earnie Larsson, MD Follow up in 2 week(s).   Specialty: Neurosurgery Contact information: [address] [place] [address] [phone]  SignedTheodoro Doing Jevon Littlepage 12/22/2021, 8:19 AM

## 2021-12-22 NOTE — Plan of Care (Signed)
Adequately ready for Discharge home

## 2021-12-22 NOTE — Evaluation (Signed)
Physical Therapy Evaluation and Discharge Patient Details Name: Douglas Wiggins MRN: [id number] DOB: 26-Nov-1966 Today's Date: 12/22/2021  History of Present Illness  Pt is a 56 y/o male s/p C5-6 ACDF and R carpal tunnel release. PMH includes gout, HTN, R THA, and L TKA.  Clinical Impression  Patient evaluated by Physical Therapy with no further acute PT needs identified. All education has been completed and the patient has no further questions. Pt overall steady and at a supervision level for mobility. Impulsive and requiring cues to maintain precautions. Reviewed precautions and generalized walking program. See below for any follow-up Physical Therapy or equipment needs. PT is signing off. Thank you for this referral. If needs change, please re-consult.         Recommendations for follow up therapy are one component of a multi-disciplinary discharge planning process, led by the attending physician.  Recommendations may be updated based on patient status, additional functional criteria and insurance authorization.  Follow Up Recommendations No PT follow up    Assistance Recommended at Discharge PRN  Patient can return home with the following       Equipment Recommendations None recommended by PT  Recommendations for Other Services       Functional Status Assessment Patient has had a recent decline in their functional status and demonstrates the ability to make significant improvements in function in a reasonable and predictable amount of time.     Precautions / Restrictions Precautions Precautions: Cervical Precaution Booklet Issued: Yes (comment) Precaution Comments: REviewed cervical precautions with pt. Required Braces or Orthoses: Cervical Brace Restrictions Weight Bearing Restrictions: No      Mobility  Bed Mobility Overal bed mobility: Needs Assistance Bed Mobility: Rolling, Sidelying to Sit, Sit to Sidelying Rolling: Supervision Sidelying to sit: Supervision     Sit  to sidelying: Supervision General bed mobility comments: Cues for log roll technique and maintaining precautions.    Transfers Overall transfer level: Needs assistance Equipment used: None Transfers: Sit to/from Stand Sit to Stand: Supervision           General transfer comment: Supervision for safety    Ambulation/Gait Ambulation/Gait assistance: Supervision Gait Distance (Feet): 400 Feet Assistive device: None Gait Pattern/deviations: Step-through pattern, Decreased stride length Gait velocity: WFL     General Gait Details: Overall steady gait. Supervision for safety. Cues to maintain precautions as pt tended to turn neck throughout. Educated about walking program to perform at home.  Stairs Stairs: Yes Stairs assistance: Supervision Stair Management: One rail Left, Step to pattern, Forwards Number of Stairs: 1 General stair comments: overall steady stair navigation. No LOB noted.  Wheelchair Mobility    Modified Rankin (Stroke Patients Only)       Balance Overall balance assessment: No apparent balance deficits (not formally assessed)                                           Pertinent Vitals/Pain Pain Assessment Pain Assessment: Faces Faces Pain Scale: Hurts little more Pain Location: neck Pain Descriptors / Indicators: Grimacing, Guarding Pain Intervention(s): Limited activity within patient's tolerance, Monitored during session, Repositioned    Home Living Family/patient expects to be discharged to:: Private residence Living Arrangements: Alone Available Help at Discharge: Family;Available PRN/intermittently Type of Home: House Home Access: Stairs to enter Entrance Stairs-Rails: Left Entrance Stairs-Number of Steps: 1   Home Layout: One level Home Equipment: None  Prior Function Prior Level of Function : Independent/Modified Independent                     Hand Dominance        Extremity/Trunk Assessment    Upper Extremity Assessment Upper Extremity Assessment: Defer to OT evaluation    Lower Extremity Assessment Lower Extremity Assessment: Generalized weakness    Cervical / Trunk Assessment Cervical / Trunk Assessment: Neck Surgery  Communication   Communication: No difficulties  Cognition Arousal/Alertness: Awake/alert Behavior During Therapy: Impulsive Overall Cognitive Status: No family/caregiver present to determine baseline cognitive functioning                                 General Comments: Pt very impulsive during mobility. Required cues to maintain precautions.        General Comments      Exercises     Assessment/Plan    PT Assessment Patient does not need any further PT services  PT Problem List         PT Treatment Interventions      PT Goals (Current goals can be found in the Care Plan section)  Acute Rehab PT Goals Patient Stated Goal: to go home PT Goal Formulation: With patient Time For Goal Achievement: 12/22/21 Potential to Achieve Goals: Good    Frequency       Co-evaluation               AM-PAC PT "6 Clicks" Mobility  Outcome Measure Help needed turning from your back to your side while in a flat bed without using bedrails?: None Help needed moving from lying on your back to sitting on the side of a flat bed without using bedrails?: None Help needed moving to and from a bed to a chair (including a wheelchair)?: None Help needed standing up from a chair using your arms (e.g., wheelchair or bedside chair)?: None Help needed to walk in hospital room?: None Help needed climbing 3-5 steps with a railing? : None 6 Click Score: 24    End of Session Equipment Utilized During Treatment: Cervical collar Activity Tolerance: Patient tolerated treatment well Patient left: in bed;with call bell/phone within reach Nurse Communication: Mobility status PT Visit Diagnosis: Other abnormalities of gait and mobility (R26.89)     Time: 9628-3662 PT Time Calculation (min) (ACUTE ONLY): 16 min   Charges:   PT Evaluation $PT Eval Low Complexity: 1 Low          Lou Miner, DPT  Acute Rehabilitation Services  Pager: [phone] Office: [phone]   Rudean Hitt 12/22/2021, 9:17 AM

## 2021-12-24 ENCOUNTER — Encounter (HOSPITAL_COMMUNITY): Payer: Self-pay | Admitting: Neurosurgery

## 2021-12-26 ENCOUNTER — Ambulatory Visit: Payer: Medicare Other | Admitting: Neurology

## 2022-05-30 NOTE — H&P (Signed)
   Patient: Douglas Wiggins  PID: [id number]  DOB: 01/10/1966  SEX: Male   Patient referred by Raymondo Band, DDS for extraction teeth1, 8, 15, 16, 17, 30, 31, 32  CC: Painful teeth.  Past Medical History:  High Blood Pressure, Sleep Apnea, Thyroid trouble, Lichen Planus, GERD, Chronic Pain, DDD, Gout, Migraine, ADHD, Hypothyroid    Medications: Losartan/HCTZ, Pantoprazole, trazadone, Duloxatene, Levothyroxine, Meloxicam, Gabapentin, Flexeril, amphetamine    Allergies:     Sulfa    Surgeries:   carpal tunnel, knee replacement, hip replacement surgery, intestinal surgery, back surgery     Social History       Smoking:  n          Alcohol:n Drug use:n                             Exam: BMI  34. Multiple caries teeth # 1, 8, 15, 16, 17, 30, 31, 32. Whitish mucosal patches throughout mouth. 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 Oral cancer screening negative. Pharynx clear. No lymphadenopathy.  Panorex:Multiple caries teeth # 1, 8, 15, 16, 17, 30, 31, 32.   Assessment: ASA 3. Non-restorable  teeth # 1, 8, 15, 16, 17, 30, 31, 32 . Leukoplakia right retromolar trigone.            Plan: Extraction Teeth # 1, 8, 15, 16, 17, 30, 31, 32. Biopsy oral tissue at right retromolar trigone.          Hospital Day surgery.                 Rx: n               Risks and complications explained. Questions answered.   Gae Bon, DMD

## 2022-06-05 ENCOUNTER — Other Ambulatory Visit: Payer: Self-pay

## 2022-06-05 ENCOUNTER — Encounter (HOSPITAL_COMMUNITY): Payer: Self-pay | Admitting: Oral Surgery

## 2022-06-05 NOTE — Progress Notes (Signed)
PCP - Dr. Lorel Monaco- [facility] in [place], [place]  Cardiologist - Denies  EP- Denies  Endocrine- Denies  Pulm- Denies  Chest x-ray - Denies  EKG - 12/18/21 (E)  Stress Test - Denies  ECHO - 05/26/17 (E)  Cardiac Cath - Denies  AICD-na PM-na LOOP-na  Nerve Stimulator- Denies  Dialysis- Denies  Sleep Study - Yes- Positive CPAP - Denies  LABS- 06/06/22: CBC, BMP, PCR  ASA- Denies  ERAS- No  HA1C- Denies  Anesthesia- No  Pt denies having chest pain, sob, or fever during the pre-op phone call. All instructions explained to the pt, with a verbal understanding of the material including: as of today, stop taking all Aspirin (unless instructed by your doctor) and Other Aspirin containing products, Vitamins, Fish oils, and Herbal medications. Also stop all NSAIDS i.e. Advil, Ibuprofen, Motrin, Aleve, Anaprox, Naproxen, BC, Goody Powders, and all Supplements.  Pt also instructed to wear a mask and social distance if he goes out. The opportunity to ask questions was provided.

## 2022-06-05 NOTE — Anesthesia Preprocedure Evaluation (Signed)
Anesthesia Evaluation  Patient identified by MRN, date of birth, ID band Patient awake    Reviewed: Allergy & Precautions, NPO status , Patient's Chart, lab work & pertinent test results  History of Anesthesia Complications (+) history of anesthetic complications (hiccups once with GA, but not every time)  Airway Mallampati: IV  TM Distance: >3 FB Neck ROM: Limited    Dental  (+) Poor Dentition, Chipped, Loose, Missing, Dental Advisory Given,    Pulmonary sleep apnea (noncompliant with CPAP) , former smoker,  Quit smoking 1990   Pulmonary exam normal breath sounds clear to auscultation       Cardiovascular hypertension (no meds, 132/97 in preop), Normal cardiovascular exam Rhythm:Regular Rate:Normal  Echo 2018 normal   Neuro/Psych  Headaches, PSYCHIATRIC DISORDERS Depression    GI/Hepatic Neg liver ROS, hiatal hernia, GERD  Medicated and Controlled,Diverticulitis s/p multiple open abdominal surgeries    Endo/Other  Hypothyroidism BMI 33  Renal/GU negative Renal ROS  negative genitourinary   Musculoskeletal  (+) Arthritis , Osteoarthritis,    Abdominal (+) + obese,   Peds  Hematology negative hematology ROS (+)   Anesthesia Other Findings   Reproductive/Obstetrics negative OB ROS                            Anesthesia Physical Anesthesia Plan  ASA: 3  Anesthesia Plan: General   Post-op Pain Management: Tylenol PO (pre-op)* and Toradol IV (intra-op)*   Induction: Intravenous  PONV Risk Score and Plan: 3 and Ondansetron, Dexamethasone and Midazolam  Airway Management Planned: Nasal ETT and Video Laryngoscope Planned  Additional Equipment: None  Intra-op Plan:   Post-operative Plan: Extubation in OR  Informed Consent: I have reviewed the patients History and Physical, chart, labs and discussed the procedure including the risks, benefits and alternatives for the proposed anesthesia  with the patient or authorized representative who has indicated his/her understanding and acceptance.     Dental advisory given  Plan Discussed with: CRNA  Anesthesia Plan Comments:        Anesthesia Quick Evaluation

## 2022-06-06 ENCOUNTER — Encounter (HOSPITAL_COMMUNITY): Payer: Self-pay | Admitting: Oral Surgery

## 2022-06-06 ENCOUNTER — Ambulatory Visit (HOSPITAL_COMMUNITY)
Admission: RE | Admit: 2022-06-06 | Discharge: 2022-06-06 | Disposition: A | Discharge status: Home / Self Care | Payer: Medicare Other | Attending: Oral Surgery | Admitting: Oral Surgery

## 2022-06-06 ENCOUNTER — Ambulatory Visit (HOSPITAL_COMMUNITY): Payer: Medicare Other

## 2022-06-06 ENCOUNTER — Encounter (HOSPITAL_COMMUNITY)
Admission: RE | Disposition: A | Discharge status: Home / Self Care | Payer: Self-pay | Source: Home / Self Care | Attending: Oral Surgery

## 2022-06-06 ENCOUNTER — Other Ambulatory Visit: Payer: Self-pay

## 2022-06-06 ENCOUNTER — Ambulatory Visit (HOSPITAL_BASED_OUTPATIENT_CLINIC_OR_DEPARTMENT_OTHER): Payer: Medicare Other

## 2022-06-06 DIAGNOSIS — E039 Hypothyroidism, unspecified: Secondary | ICD-10-CM | POA: Diagnosis not present

## 2022-06-06 DIAGNOSIS — Z6834 Body mass index (BMI) 34.0-34.9, adult: Secondary | ICD-10-CM | POA: Diagnosis not present

## 2022-06-06 DIAGNOSIS — K0889 Other specified disorders of teeth and supporting structures: Secondary | ICD-10-CM | POA: Diagnosis not present

## 2022-06-06 DIAGNOSIS — G8929 Other chronic pain: Secondary | ICD-10-CM | POA: Diagnosis not present

## 2022-06-06 DIAGNOSIS — K1379 Other lesions of oral mucosa: Secondary | ICD-10-CM | POA: Diagnosis not present

## 2022-06-06 DIAGNOSIS — E669 Obesity, unspecified: Secondary | ICD-10-CM | POA: Diagnosis not present

## 2022-06-06 DIAGNOSIS — D0000 Carcinoma in situ of oral cavity, unspecified site: Secondary | ICD-10-CM | POA: Insufficient documentation

## 2022-06-06 DIAGNOSIS — K029 Dental caries, unspecified: Secondary | ICD-10-CM | POA: Insufficient documentation

## 2022-06-06 DIAGNOSIS — Z87891 Personal history of nicotine dependence: Secondary | ICD-10-CM | POA: Insufficient documentation

## 2022-06-06 DIAGNOSIS — K219 Gastro-esophageal reflux disease without esophagitis: Secondary | ICD-10-CM | POA: Insufficient documentation

## 2022-06-06 DIAGNOSIS — I10 Essential (primary) hypertension: Secondary | ICD-10-CM

## 2022-06-06 DIAGNOSIS — G4730 Sleep apnea, unspecified: Secondary | ICD-10-CM | POA: Diagnosis not present

## 2022-06-06 DIAGNOSIS — Z8719 Personal history of other diseases of the digestive system: Secondary | ICD-10-CM | POA: Diagnosis not present

## 2022-06-06 HISTORY — PX: TOOTH EXTRACTION: SHX859

## 2022-06-06 LAB — BASIC METABOLIC PANEL
Anion gap: 13 (ref 5–15)
BUN: 28 mg/dL — ABNORMAL HIGH (ref 6–20)
CO2: 23 mmol/L (ref 22–32)
Calcium: 9.3 mg/dL (ref 8.9–10.3)
Chloride: 99 mmol/L (ref 98–111)
Creatinine, Ser: 1.94 mg/dL — ABNORMAL HIGH (ref 0.61–1.24)
GFR, Estimated: 40 mL/min — ABNORMAL LOW (ref >60)
Glucose, Bld: 113 mg/dL — ABNORMAL HIGH (ref 70–99)
Potassium: 3.8 mmol/L (ref 3.5–5.1)
Sodium: 135 mmol/L (ref 135–145)

## 2022-06-06 LAB — CBC
HCT: 47.9 % (ref 39.0–52.0)
Hemoglobin: 15.6 g/dL (ref 13.0–17.0)
MCH: 27.5 pg (ref 26.0–34.0)
MCHC: 32.6 g/dL (ref 30.0–36.0)
MCV: 84.5 fL (ref 80.0–100.0)
Platelets: 233 10*3/uL (ref 150–400)
RBC: 5.67 MIL/uL (ref 4.22–5.81)
RDW: 14.4 % (ref 11.5–15.5)
WBC: 7.7 10*3/uL (ref 4.0–10.5)
nRBC: 0 % (ref 0.0–0.2)

## 2022-06-06 SURGERY — DENTAL RESTORATION/EXTRACTIONS
Anesthesia: General

## 2022-06-06 MED ORDER — ONDANSETRON HCL 4 MG/2ML IJ SOLN: 4.0000 mg | Freq: Once | INTRAMUSCULAR | Status: DC | PRN

## 2022-06-06 MED ORDER — PROPOFOL 10 MG/ML IV BOLUS
INTRAVENOUS | Status: AC
  Filled 2022-06-06: qty 20

## 2022-06-06 MED ORDER — HYDROMORPHONE HCL 1 MG/ML IJ SOLN: 0.2500 mg | INTRAMUSCULAR | Status: DC | PRN

## 2022-06-06 MED ORDER — DEXAMETHASONE SODIUM PHOSPHATE 10 MG/ML IJ SOLN
INTRAMUSCULAR | Status: AC
  Filled 2022-06-06: qty 1

## 2022-06-06 MED ORDER — EPHEDRINE SULFATE-NACL 50-0.9 MG/10ML-% IV SOSY
PREFILLED_SYRINGE | INTRAVENOUS | Status: DC | PRN
  Administered 2022-06-06 (×2): 5 mg via INTRAVENOUS
  Administered 2022-06-06: 10 mg via INTRAVENOUS

## 2022-06-06 MED ORDER — LACTATED RINGERS IV SOLN: INTRAVENOUS | Status: DC

## 2022-06-06 MED ORDER — LIDOCAINE 2% (20 MG/ML) 5 ML SYRINGE
INTRAMUSCULAR | Status: AC
  Filled 2022-06-06: qty 5

## 2022-06-06 MED ORDER — AMISULPRIDE (ANTIEMETIC) 5 MG/2ML IV SOLN
10.0000 mg | Freq: Once | INTRAVENOUS | Status: DC | PRN
Start: 2022-06-06 — End: 2022-06-08

## 2022-06-06 MED ORDER — MEPERIDINE HCL 25 MG/ML IJ SOLN: 6.2500 mg | INTRAMUSCULAR | Status: DC | PRN

## 2022-06-06 MED ORDER — FENTANYL CITRATE (PF) 250 MCG/5ML IJ SOLN
INTRAMUSCULAR | Status: AC
  Filled 2022-06-06: qty 5

## 2022-06-06 MED ORDER — 0.9 % SODIUM CHLORIDE (POUR BTL) OPTIME
TOPICAL | Status: DC | PRN
  Administered 2022-06-06: 1000 mL

## 2022-06-06 MED ORDER — ROCURONIUM BROMIDE 10 MG/ML (PF) SYRINGE
PREFILLED_SYRINGE | INTRAVENOUS | Status: AC
  Filled 2022-06-06: qty 10

## 2022-06-06 MED ORDER — ONDANSETRON HCL 4 MG/2ML IJ SOLN
INTRAMUSCULAR | Status: DC | PRN
  Administered 2022-06-06: 4 mg via INTRAVENOUS

## 2022-06-06 MED ORDER — OXYCODONE HCL 5 MG/5ML PO SOLN: 5.0000 mg | Freq: Once | ORAL | Status: DC | PRN

## 2022-06-06 MED ORDER — LIDOCAINE-EPINEPHRINE 2 %-1:100000 IJ SOLN
INTRAMUSCULAR | Status: AC
  Filled 2022-06-06: qty 1

## 2022-06-06 MED ORDER — CEFAZOLIN SODIUM-DEXTROSE 2-4 GM/100ML-% IV SOLN
2.0000 g | INTRAVENOUS | Status: AC
  Administered 2022-06-06: 2 g via INTRAVENOUS
  Filled 2022-06-06: qty 100

## 2022-06-06 MED ORDER — CLINDAMYCIN HCL 300 MG PO CAPS: 300.0000 mg | ORAL_CAPSULE | Freq: Three times a day (TID) | ORAL | 0 refills | Status: DC

## 2022-06-06 MED ORDER — PHENYLEPHRINE 80 MCG/ML (10ML) SYRINGE FOR IV PUSH (FOR BLOOD PRESSURE SUPPORT)
PREFILLED_SYRINGE | INTRAVENOUS | Status: DC | PRN
  Administered 2022-06-06: 160 ug via INTRAVENOUS
  Administered 2022-06-06: 80 ug via INTRAVENOUS
  Administered 2022-06-06 (×2): 160 ug via INTRAVENOUS
  Administered 2022-06-06: 80 ug via INTRAVENOUS
  Administered 2022-06-06: 160 ug via INTRAVENOUS

## 2022-06-06 MED ORDER — ORAL CARE MOUTH RINSE
15.0000 mL | Freq: Once | OROMUCOSAL | Status: AC
Start: 2022-06-06 — End: 2022-06-06

## 2022-06-06 MED ORDER — ROCURONIUM BROMIDE 10 MG/ML (PF) SYRINGE
PREFILLED_SYRINGE | INTRAVENOUS | Status: DC | PRN
  Administered 2022-06-06: 80 mg via INTRAVENOUS

## 2022-06-06 MED ORDER — KETOROLAC TROMETHAMINE 30 MG/ML IJ SOLN: 30.0000 mg | Freq: Once | INTRAMUSCULAR | Status: AC | PRN

## 2022-06-06 MED ORDER — OXYCODONE-ACETAMINOPHEN 5-325 MG PO TABS: 1.0000 | ORAL_TABLET | Freq: Four times a day (QID) | ORAL | 0 refills | Status: DC | PRN

## 2022-06-06 MED ORDER — OXYMETAZOLINE HCL 0.05 % NA SOLN
NASAL | Status: DC | PRN
  Administered 2022-06-06 (×3): 2 via NASAL

## 2022-06-06 MED ORDER — LIDOCAINE-EPINEPHRINE 2 %-1:100000 IJ SOLN
INTRAMUSCULAR | Status: DC | PRN
  Administered 2022-06-06: 12 mL via INTRADERMAL

## 2022-06-06 MED ORDER — ACETAMINOPHEN 500 MG PO TABS
1000.0000 mg | ORAL_TABLET | Freq: Once | ORAL | Status: AC
  Administered 2022-06-06: 1000 mg via ORAL
  Filled 2022-06-06: qty 2

## 2022-06-06 MED ORDER — DEXMEDETOMIDINE HCL IN NACL 200 MCG/50ML IV SOLN
INTRAVENOUS | Status: DC | PRN
  Administered 2022-06-06: 12 ug via INTRAVENOUS
  Administered 2022-06-06: 8 ug via INTRAVENOUS

## 2022-06-06 MED ORDER — ONDANSETRON HCL 4 MG/2ML IJ SOLN
INTRAMUSCULAR | Status: AC
  Filled 2022-06-06: qty 2

## 2022-06-06 MED ORDER — LIDOCAINE 2% (20 MG/ML) 5 ML SYRINGE
INTRAMUSCULAR | Status: DC | PRN
  Administered 2022-06-06: 60 mg via INTRAVENOUS

## 2022-06-06 MED ORDER — MIDAZOLAM HCL 2 MG/2ML IJ SOLN
INTRAMUSCULAR | Status: AC
Start: 2022-06-06 — End: ?
  Filled 2022-06-06: qty 2

## 2022-06-06 MED ORDER — SUGAMMADEX SODIUM 200 MG/2ML IV SOLN
INTRAVENOUS | Status: DC | PRN
  Administered 2022-06-06: 200 mg via INTRAVENOUS

## 2022-06-06 MED ORDER — MIDAZOLAM HCL 2 MG/2ML IJ SOLN
INTRAMUSCULAR | Status: DC | PRN
  Administered 2022-06-06: 2 mg via INTRAVENOUS

## 2022-06-06 MED ORDER — SODIUM CHLORIDE 0.9 % IR SOLN
Status: DC | PRN
  Administered 2022-06-06: 1

## 2022-06-06 MED ORDER — PROPOFOL 10 MG/ML IV BOLUS
INTRAVENOUS | Status: DC | PRN
  Administered 2022-06-06: 50 mg via INTRAVENOUS
  Administered 2022-06-06: 200 mg via INTRAVENOUS
  Administered 2022-06-06: 50 mg via INTRAVENOUS

## 2022-06-06 MED ORDER — DEXAMETHASONE SODIUM PHOSPHATE 10 MG/ML IJ SOLN
INTRAMUSCULAR | Status: DC | PRN
  Administered 2022-06-06: 10 mg via INTRAVENOUS

## 2022-06-06 MED ORDER — DEXMEDETOMIDINE HCL IN NACL 80 MCG/20ML IV SOLN
INTRAVENOUS | Status: AC
  Filled 2022-06-06: qty 20

## 2022-06-06 MED ORDER — OXYCODONE HCL 5 MG PO TABS: 5.0000 mg | ORAL_TABLET | Freq: Once | ORAL | Status: DC | PRN

## 2022-06-06 MED ORDER — FENTANYL CITRATE (PF) 250 MCG/5ML IJ SOLN
INTRAMUSCULAR | Status: DC | PRN
  Administered 2022-06-06: 100 ug via INTRAVENOUS
  Administered 2022-06-06: 50 ug via INTRAVENOUS

## 2022-06-06 MED ORDER — CHLORHEXIDINE GLUCONATE 0.12 % MT SOLN
15.0000 mL | Freq: Once | OROMUCOSAL | Status: AC
Start: 2022-06-06 — End: 2022-06-06
  Administered 2022-06-06: 15 mL via OROMUCOSAL
  Filled 2022-06-06: qty 15

## 2022-06-06 SURGICAL SUPPLY — 38 items
BAG COUNTER SPONGE SURGICOUNT (BAG) IMPLANT
BLADE SURG 15 STRL LF DISP TIS (BLADE) ×1 IMPLANT
BLADE SURG 15 STRL SS (BLADE) ×2
BUR CROSS CUT FISSURE 1.6 (BURR) ×2 IMPLANT
BUR EGG ELITE 4.0 (BURR) ×2 IMPLANT
CANISTER SUCT 3000ML PPV (MISCELLANEOUS) ×2 IMPLANT
COVER SURGICAL LIGHT HANDLE (MISCELLANEOUS) ×2 IMPLANT
DRAPE U-SHAPE 76X120 STRL (DRAPES) ×2 IMPLANT
GAUZE PACKING FOLDED 2  STR (GAUZE/BANDAGES/DRESSINGS) ×2
GAUZE PACKING FOLDED 2 STR (GAUZE/BANDAGES/DRESSINGS) ×1 IMPLANT
GLOVE BIO SURGEON STRL SZ 6.5 (GLOVE) IMPLANT
GLOVE BIO SURGEON STRL SZ7 (GLOVE) IMPLANT
GLOVE BIO SURGEON STRL SZ8 (GLOVE) ×2 IMPLANT
GLOVE BIOGEL PI IND STRL 6.5 (GLOVE) IMPLANT
GLOVE BIOGEL PI IND STRL 7.0 (GLOVE) IMPLANT
GLOVE BIOGEL PI INDICATOR 6.5 (GLOVE)
GLOVE BIOGEL PI INDICATOR 7.0 (GLOVE)
GOWN STRL REUS W/ TWL LRG LVL3 (GOWN DISPOSABLE) ×1 IMPLANT
GOWN STRL REUS W/ TWL XL LVL3 (GOWN DISPOSABLE) ×1 IMPLANT
GOWN STRL REUS W/TWL LRG LVL3 (GOWN DISPOSABLE) ×2
GOWN STRL REUS W/TWL XL LVL3 (GOWN DISPOSABLE) ×2
IV NS 1000ML (IV SOLUTION) ×2
IV NS 1000ML BAXH (IV SOLUTION) ×1 IMPLANT
KIT BASIN OR (CUSTOM PROCEDURE TRAY) ×2 IMPLANT
KIT TURNOVER KIT B (KITS) ×2 IMPLANT
NDL HYPO 25GX1X1/2 BEV (NEEDLE) ×2 IMPLANT
NEEDLE HYPO 25GX1X1/2 BEV (NEEDLE) ×2 IMPLANT
NS IRRIG 1000ML POUR BTL (IV SOLUTION) ×2 IMPLANT
PAD ARMBOARD 7.5X6 YLW CONV (MISCELLANEOUS) ×2 IMPLANT
SLEEVE IRRIGATION ELITE 7 (MISCELLANEOUS) ×2 IMPLANT
SPIKE FLUID TRANSFER (MISCELLANEOUS) ×2 IMPLANT
SPONGE SURGIFOAM ABS GEL 12-7 (HEMOSTASIS) IMPLANT
SUT CHROMIC 3 0 PS 2 (SUTURE) ×2 IMPLANT
SYR BULB IRRIG 60ML STRL (SYRINGE) ×2 IMPLANT
SYR CONTROL 10ML LL (SYRINGE) ×2 IMPLANT
TRAY ENT MC OR (CUSTOM PROCEDURE TRAY) ×2 IMPLANT
TUBING IRRIGATION (MISCELLANEOUS) ×2 IMPLANT
YANKAUER SUCT BULB TIP NO VENT (SUCTIONS) ×2 IMPLANT

## 2022-06-06 NOTE — Op Note (Signed)
NAME: Douglas Wiggins, Douglas Wiggins MEDICAL RECORD NO: [id number] ACCOUNT NO: [id number] DATE OF BIRTH: 09-Oct-1966 FACILITY: [facility] LOCATION: [facility] PHYSICIAN: Gae Bon, DDS  Operative Report   DATE OF PROCEDURE: 06/06/2022  PREOPERATIVE DIAGNOSES:  Nonrestorable teeth 8, 1, 15, 16, 17, 30, 31, 32 secondary to dental caries, lesion in the right retromolar trigone, previous history of oral lichen planus.  PROCEDURE:  Extraction teeth 1, 8, 15, 16, 17, 30, 31, 32 and biopsy soft tissue, right retromolar trigone.  SURGEON:  Gae Bon, DDS  ANESTHESIA:  General, nasal intubation. Dr. Doroteo Glassman attending.  DESCRIPTION OF PROCEDURE:  The patient was taken to the operating room and placed on the table in supine position.  General anesthesia was administered and a nasal endotracheal tube was placed and secured.  The eyes were protected.  The patient was  draped and a timeout was performed.  The posterior pharynx was suctioned and a throat pack was placed.  2% lidocaine, 1:100,000 epinephrine was infiltrated in an inferior alveolar block on the right and left sides and in buccal and palatal infiltration  in the maxilla around the teeth to be removed.  A bite block was placed on the right side of the mouth.  A sweetheart retractor was used to retract the tongue.  A 15 blade was used to make an incision overlying residual roots of tooth #17.  The  periosteum was reflected from around these roots with the periosteal elevator and the roots were grasped with the rongeur and removed.  Socket was curetted, irrigated, closed with 3-0 chromic.  The 15 blade was used to make an incision around tooth #16  and 15 in the gingival sulcus.  The periosteum was reflected and then the teeth were elevated with a 301 elevator and removed with dental forceps.  The sockets were curetted, irrigated and closed with 3-0 chromic.  The bite block and sweetheart retractor  were repositioned to the other side of the mouth.  A 15  blade was used to make an incision around teeth numbers 8, 1 and 30, 31, 32 in the gingival sulcus.  The periosteum was reflected from around these teeth.  Teeth were elevated and removed with the  dental forceps.  The sockets were curetted.  The lower right sockets were debrided with the curette and rongeur and then these areas were sutured with 3-0 chromic.  Then, the tissue of the right retromolar trigone was biopsied.  A 15 blade was used to  make a wedge incision and the tissue was submitted for pathology.  The area was then closed with 3-0 chromic interrupted sutures.  Then, the oral cavity was irrigated and suctioned.  Additional local anesthesia was administered.  The throat pack was  removed.  The patient was left under care of anesthesia for extubation and transport to recovery room with plans for discharge home through day surgery.  ESTIMATED BLOOD LOSS:  Minimum.  COMPLICATIONS:  None.  SPECIMEN:  Tissue, right mandibular retromolar trigone, previous history of oral lichen planus.   SHW D: 06/06/2022 9:51:23 am T: 06/06/2022 10:58:00 am  JOB: [id number]/ [id number]

## 2022-06-06 NOTE — Anesthesia Procedure Notes (Addendum)
Procedure Name: Intubation Date/Time: 06/06/2022 9:05 AM  Performed by: Michele Rockers, CRNAPre-anesthesia Checklist: Patient identified, Patient being monitored, Timeout performed, Emergency Drugs available and Suction available Patient Re-evaluated:Patient Re-evaluated prior to induction Oxygen Delivery Method: Circle system utilized Preoxygenation: Pre-oxygenation with 100% oxygen Induction Type: IV induction Ventilation: Two handed mask ventilation required and Oral airway inserted - appropriate to patient size Laryngoscope Size: Mac, 4 and Glidescope Grade View: Grade I Nasal Tubes: Right, Nasal prep performed and Nasal Rae Tube size: 7.5 mm Number of attempts: 1 Placement Confirmation: ETT inserted through vocal cords under direct vision, positive ETCO2 and breath sounds checked- equal and bilateral Tube secured with: Tape Dental Injury: Teeth and Oropharynx as per pre-operative assessment

## 2022-06-06 NOTE — H&P (Signed)
H&P documentation  -History and Physical Reviewed  -Patient has been re-examined  -No change in the plan of care  Douglas Wiggins  

## 2022-06-06 NOTE — Op Note (Signed)
06/06/2022  9:46 AM  PATIENT:  Douglas Wiggins  56 y.o. male  PRE-OPERATIVE DIAGNOSIS:  non-restorable teeth # 1, 8, 15, 16, 17, 30, 31, 32 secondary to [name]. Lesion right retromolar trigone PROCEDURE:  Procedure(s): DENTAL EXTRACTION teeth # 1, 8, 15, 16, 17, 30, 31, 32 , biopsy soft tissue right mandibular retromolar trigone  SURGEON:  Surgeon(s): Diona Browner, DMD  ANESTHESIA:   local and general  EBL:  minimal  DRAINS: none   SPECIMEN:  tissue right mandibular retromolar trigone  COUNTS:  YES  PLAN OF CARE: Discharge to home after PACU  PATIENT DISPOSITION:  PACU - hemodynamically stable.   PROCEDURE DETAILS: Dictation # [id number]  Gae Bon, DMD 06/06/2022 9:46 AM

## 2022-06-06 NOTE — Transfer of Care (Addendum)
Immediate Anesthesia Transfer of Care Note  Patient: ORENTHAL DEBSKI  Procedure(s) Performed: DENTAL RESTORATION/EXTRACTIONS  Patient Location: PACU  Anesthesia Type:General  Level of Consciousness: drowsy  Airway & Oxygen Therapy: Patient Spontanous Breathing and Patient connected to face mask oxygen  Post-op Assessment: Report given to RN, Post -op Vital signs reviewed and stable and Patient moving all extremities X 4  Post vital signs: Reviewed and stable  Last Vitals:  Vitals Value Taken Time  BP    Temp    Pulse 71 06/06/22 0958  Resp 19 06/06/22 0958  SpO2 98 % 06/06/22 0958  Vitals shown include unvalidated device data.  Last Pain:  Vitals:   06/06/22 0715  TempSrc:   PainSc: 5       Patients Stated Pain Goal: 2 (14/84/03 9795)  Complications: No notable events documented.

## 2022-06-06 NOTE — Anesthesia Postprocedure Evaluation (Signed)
Anesthesia Post Note  Patient: [name]  Procedure(s) Performed: DENTAL RESTORATION/EXTRACTIONS     Patient location during evaluation: PACU Anesthesia Type: General Level of consciousness: awake and alert, oriented and patient cooperative Pain management: pain level controlled Vital Signs Assessment: post-procedure vital signs reviewed and stable Respiratory status: spontaneous breathing, nonlabored ventilation and respiratory function stable Cardiovascular status: blood pressure returned to baseline and stable Postop Assessment: no apparent nausea or vomiting Anesthetic complications: no   No notable events documented.  Last Vitals:  Vitals:   06/06/22 1015 06/06/22 1030  BP: 100/64 133/71  Pulse: 75 79  Resp: 12 16  Temp:  36.7 C  SpO2: 94% 92%    Last Pain:  Vitals:   06/06/22 1030  TempSrc:   PainSc: 0-No pain                 [name]

## 2022-06-07 ENCOUNTER — Encounter (HOSPITAL_COMMUNITY): Payer: Self-pay | Admitting: Oral Surgery

## 2022-06-17 LAB — SURGICAL PATHOLOGY

## 2022-06-21 ENCOUNTER — Other Ambulatory Visit: Payer: Self-pay | Admitting: Otolaryngology

## 2022-06-21 DIAGNOSIS — C060 Malignant neoplasm of cheek mucosa: Secondary | ICD-10-CM

## 2022-07-09 ENCOUNTER — Ambulatory Visit
Admission: RE | Admit: 2022-07-09 | Discharge: 2022-07-09 | Disposition: A | Discharge status: Home / Self Care | Payer: Medicare Other | Source: Ambulatory Visit | Attending: Otolaryngology | Admitting: Otolaryngology

## 2022-07-09 DIAGNOSIS — C060 Malignant neoplasm of cheek mucosa: Secondary | ICD-10-CM

## 2022-07-09 MED ORDER — IOPAMIDOL (ISOVUE-300) INJECTION 61%
75.0000 mL | Freq: Once | INTRAVENOUS | Status: AC | PRN
Start: 2022-07-09 — End: 2022-07-09
  Administered 2022-07-09: 75 mL via INTRAVENOUS

## 2022-07-12 ENCOUNTER — Other Ambulatory Visit: Payer: Self-pay | Admitting: Otolaryngology

## 2022-07-22 ENCOUNTER — Other Ambulatory Visit: Payer: Self-pay

## 2022-07-22 NOTE — Progress Notes (Addendum)
Patient was called with questions and instructions for the surgery day. Patient denied any contact with somebody tested positive for COVID or with symptoms of COVID. All instructions explained to the patient, with a verbal understanding of the material. The opportunity to ask questions was provided.  Patient verbally denies any shortness of breath, fever, cough and chest pain during phone call  Patient with OSA - denied wearing CPAP   -------------  SDW INSTRUCTIONS given:  Your procedure is scheduled on Tuesday, August 22nd, 2023.  Report to [facility] Main Entrance "A" at 10:30 A.M., and check in at the Admitting office.  Call this number if you have problems the morning of surgery:  [phone]   Remember:  Do not eat after midnight the night before your surgery  You may drink clear liquids until 10:00 the morning of your surgery.   Clear liquids allowed are: Water, Non-Citrus Juices (without pulp), Carbonated Beverages, Clear Tea, Black Coffee Only, and Gatorade    Take these medicines the morning of surgery with A SIP OF WATER Cymbalta, Gabapentin, Synthroid, Protonix  As of today, STOP taking any Aspirin (unless otherwise instructed by your surgeon) Aleve, Naproxen, Ibuprofen, Motrin, Advil, Goody's, BC's, all herbal medications, fish oil, and all vitamins.  The day of surgery:                     Do not wear jewelry            Do not wear lotions, powders, colognes, or deodorant.            Men may shave face and neck.            Do not bring valuables to the hospital.            [facility] is not responsible for any belongings or valuables.  Do NOT Smoke (Tobacco/Vaping) 24 hours prior to your procedure If you use a CPAP at night, you may bring all equipment for your overnight stay.   Contacts, glasses, dentures or bridgework may not be worn into surgery.      For patients admitted to the hospital, discharge time will be determined by your treatment team.   Patients  discharged the day of surgery will not be allowed to drive home, and someone needs to stay with them for 24 hours.    Special instructions:   [place]- Preparing For Surgery  Before surgery, you can play an important role. Because skin is not sterile, your skin needs to be as free of germs as possible. You can reduce the number of germs on your skin by washing with CHG (chlorahexidine gluconate) Soap before surgery.  CHG is an antiseptic cleaner which kills germs and bonds with the skin to continue killing germs even after washing.    Oral Hygiene is also important to reduce your risk of infection.  Remember - BRUSH YOUR TEETH THE MORNING OF SURGERY WITH YOUR REGULAR TOOTHPASTE  Please do not use if you have an allergy to CHG or antibacterial soaps. If your skin becomes reddened/irritated stop using the CHG.  Do not shave (including legs and underarms) for at least 48 hours prior to first CHG shower. It is OK to shave your face.  Please follow these instructions carefully.   Shower the NIGHT BEFORE SURGERY and the MORNING OF SURGERY with DIAL Soap.   Pat yourself dry with a CLEAN TOWEL.  Wear CLEAN PAJAMAS to bed the night before surgery  Place  CLEAN SHEETS on your bed the night of your first shower and DO NOT SLEEP WITH PETS.   Day of Surgery: Please shower morning of surgery  Wear Clean/Comfortable clothing the morning of surgery Do not apply any deodorants/lotions.   Remember to brush your teeth WITH YOUR REGULAR TOOTHPASTE.   Questions were answered. Patient verbalized understanding of instructions.

## 2022-07-23 ENCOUNTER — Ambulatory Visit (HOSPITAL_COMMUNITY): Payer: Medicare Other | Admitting: Certified Registered"

## 2022-07-23 ENCOUNTER — Ambulatory Visit (HOSPITAL_COMMUNITY)
Admission: RE | Admit: 2022-07-23 | Discharge: 2022-07-23 | Disposition: A | Discharge status: Home / Self Care | Payer: Medicare Other | Attending: Otolaryngology | Admitting: Otolaryngology

## 2022-07-23 ENCOUNTER — Encounter (HOSPITAL_COMMUNITY)
Admission: RE | Disposition: A | Discharge status: Home / Self Care | Payer: Self-pay | Source: Home / Self Care | Attending: Otolaryngology

## 2022-07-23 ENCOUNTER — Other Ambulatory Visit: Payer: Self-pay

## 2022-07-23 ENCOUNTER — Other Ambulatory Visit (HOSPITAL_COMMUNITY): Payer: Self-pay

## 2022-07-23 ENCOUNTER — Encounter (HOSPITAL_COMMUNITY): Payer: Self-pay | Admitting: Otolaryngology

## 2022-07-23 ENCOUNTER — Ambulatory Visit (HOSPITAL_BASED_OUTPATIENT_CLINIC_OR_DEPARTMENT_OTHER): Payer: Medicare Other | Admitting: Certified Registered"

## 2022-07-23 DIAGNOSIS — E039 Hypothyroidism, unspecified: Secondary | ICD-10-CM | POA: Insufficient documentation

## 2022-07-23 DIAGNOSIS — F32A Depression, unspecified: Secondary | ICD-10-CM | POA: Insufficient documentation

## 2022-07-23 DIAGNOSIS — G709 Myoneural disorder, unspecified: Secondary | ICD-10-CM | POA: Diagnosis not present

## 2022-07-23 DIAGNOSIS — Z87891 Personal history of nicotine dependence: Secondary | ICD-10-CM | POA: Insufficient documentation

## 2022-07-23 DIAGNOSIS — I10 Essential (primary) hypertension: Secondary | ICD-10-CM | POA: Diagnosis not present

## 2022-07-23 DIAGNOSIS — D0000 Carcinoma in situ of oral cavity, unspecified site: Secondary | ICD-10-CM | POA: Diagnosis present

## 2022-07-23 DIAGNOSIS — K219 Gastro-esophageal reflux disease without esophagitis: Secondary | ICD-10-CM | POA: Insufficient documentation

## 2022-07-23 DIAGNOSIS — M1990 Unspecified osteoarthritis, unspecified site: Secondary | ICD-10-CM | POA: Diagnosis not present

## 2022-07-23 DIAGNOSIS — Z79899 Other long term (current) drug therapy: Secondary | ICD-10-CM | POA: Insufficient documentation

## 2022-07-23 DIAGNOSIS — C060 Malignant neoplasm of cheek mucosa: Secondary | ICD-10-CM

## 2022-07-23 HISTORY — PX: EXCISION ORAL TUMOR: SHX6265

## 2022-07-23 LAB — BASIC METABOLIC PANEL
Anion gap: 10 (ref 5–15)
BUN: 20 mg/dL (ref 6–20)
CO2: 26 mmol/L (ref 22–32)
Calcium: 9.6 mg/dL (ref 8.9–10.3)
Chloride: 102 mmol/L (ref 98–111)
Creatinine, Ser: 1.31 mg/dL — ABNORMAL HIGH (ref 0.61–1.24)
GFR, Estimated: >60 mL/min (ref >60)
Glucose, Bld: 109 mg/dL — ABNORMAL HIGH (ref 70–99)
Potassium: 3.9 mmol/L (ref 3.5–5.1)
Sodium: 138 mmol/L (ref 135–145)

## 2022-07-23 LAB — CBC
HCT: 48.8 % (ref 39.0–52.0)
Hemoglobin: 16.2 g/dL (ref 13.0–17.0)
MCH: 27.8 pg (ref 26.0–34.0)
MCHC: 33.2 g/dL (ref 30.0–36.0)
MCV: 83.7 fL (ref 80.0–100.0)
Platelets: 210 10*3/uL (ref 150–400)
RBC: 5.83 MIL/uL — ABNORMAL HIGH (ref 4.22–5.81)
RDW: 13.7 % (ref 11.5–15.5)
WBC: 5.8 10*3/uL (ref 4.0–10.5)
nRBC: 0 % (ref 0.0–0.2)

## 2022-07-23 LAB — SURGICAL PCR SCREEN
MRSA, PCR: NEGATIVE
Staphylococcus aureus: NEGATIVE

## 2022-07-23 SURGERY — EXCISION, NEOPLASM, MOUTH
Anesthesia: General | Site: Mouth | Laterality: Right

## 2022-07-23 MED ORDER — PROPOFOL 10 MG/ML IV BOLUS
INTRAVENOUS | Status: AC
  Filled 2022-07-23: qty 20

## 2022-07-23 MED ORDER — PROPOFOL 10 MG/ML IV BOLUS
INTRAVENOUS | Status: DC | PRN
  Administered 2022-07-23: 20 mg via INTRAVENOUS
  Administered 2022-07-23: 180 mg via INTRAVENOUS

## 2022-07-23 MED ORDER — MIDAZOLAM HCL 2 MG/2ML IJ SOLN
INTRAMUSCULAR | Status: AC
  Filled 2022-07-23: qty 2

## 2022-07-23 MED ORDER — OXYMETAZOLINE HCL 0.05 % NA SOLN
NASAL | Status: AC
  Filled 2022-07-23: qty 30

## 2022-07-23 MED ORDER — SUGAMMADEX SODIUM 500 MG/5ML IV SOLN
INTRAVENOUS | Status: AC
  Filled 2022-07-23: qty 5

## 2022-07-23 MED ORDER — LACTATED RINGERS IV SOLN: INTRAVENOUS | Status: DC | PRN

## 2022-07-23 MED ORDER — LIDOCAINE 2% (20 MG/ML) 5 ML SYRINGE
INTRAMUSCULAR | Status: AC
  Filled 2022-07-23: qty 5

## 2022-07-23 MED ORDER — LIDOCAINE-EPINEPHRINE 1 %-1:100000 IJ SOLN
INTRAMUSCULAR | Status: AC
  Filled 2022-07-23: qty 1

## 2022-07-23 MED ORDER — EPINEPHRINE PF 1 MG/ML IJ SOLN
INTRAMUSCULAR | Status: DC | PRN
  Administered 2022-07-23 (×2): 1 mg

## 2022-07-23 MED ORDER — MIDAZOLAM HCL 5 MG/5ML IJ SOLN
INTRAMUSCULAR | Status: DC | PRN
  Administered 2022-07-23: 2 mg via INTRAVENOUS

## 2022-07-23 MED ORDER — CHLORHEXIDINE GLUCONATE 0.12 % MT SOLN
10.0000 mL | Freq: Every evening | OROMUCOSAL | 0 refills | Status: AC
  Filled 2022-07-23: qty 473, 47d supply, fill #0

## 2022-07-23 MED ORDER — SUGAMMADEX SODIUM 200 MG/2ML IV SOLN
INTRAVENOUS | Status: DC | PRN
  Administered 2022-07-23: 245 mg via INTRAVENOUS

## 2022-07-23 MED ORDER — AMISULPRIDE (ANTIEMETIC) 5 MG/2ML IV SOLN: 10.0000 mg | Freq: Once | INTRAVENOUS | Status: DC | PRN

## 2022-07-23 MED ORDER — ONDANSETRON HCL 4 MG/2ML IJ SOLN
INTRAMUSCULAR | Status: AC
  Filled 2022-07-23: qty 2

## 2022-07-23 MED ORDER — OXYMETAZOLINE HCL 0.05 % NA SOLN
NASAL | Status: DC | PRN
  Administered 2022-07-23: 2 via NASAL

## 2022-07-23 MED ORDER — ROCURONIUM BROMIDE 10 MG/ML (PF) SYRINGE
PREFILLED_SYRINGE | INTRAVENOUS | Status: AC
  Filled 2022-07-23: qty 10

## 2022-07-23 MED ORDER — HEMOSTATIC AGENTS (NO CHARGE) OPTIME
TOPICAL | Status: DC | PRN
  Administered 2022-07-23: 1

## 2022-07-23 MED ORDER — LIDOCAINE-EPINEPHRINE 1 %-1:100000 IJ SOLN
INTRAMUSCULAR | Status: DC | PRN
  Administered 2022-07-23: 10 mL

## 2022-07-23 MED ORDER — LIDOCAINE 2% (20 MG/ML) 5 ML SYRINGE
INTRAMUSCULAR | Status: DC | PRN
  Administered 2022-07-23: 60 mg via INTRAVENOUS

## 2022-07-23 MED ORDER — FENTANYL CITRATE (PF) 250 MCG/5ML IJ SOLN
INTRAMUSCULAR | Status: AC
  Filled 2022-07-23: qty 5

## 2022-07-23 MED ORDER — SODIUM CHLORIDE 0.9 % IV SOLN: INTRAVENOUS | Status: DC

## 2022-07-23 MED ORDER — ACETAMINOPHEN 500 MG PO TABS
1000.0000 mg | ORAL_TABLET | Freq: Once | ORAL | Status: AC
  Administered 2022-07-23: 1000 mg via ORAL
  Filled 2022-07-23: qty 2

## 2022-07-23 MED ORDER — FENTANYL CITRATE (PF) 100 MCG/2ML IJ SOLN: 25.0000 ug | INTRAMUSCULAR | Status: DC | PRN

## 2022-07-23 MED ORDER — DEXAMETHASONE SODIUM PHOSPHATE 10 MG/ML IJ SOLN
INTRAMUSCULAR | Status: DC | PRN
  Administered 2022-07-23: 10 mg via INTRAVENOUS

## 2022-07-23 MED ORDER — ORAL CARE MOUTH RINSE
15.0000 mL | Freq: Once | OROMUCOSAL | Status: AC
  Administered 2022-07-23: 15 mL via OROMUCOSAL

## 2022-07-23 MED ORDER — FENTANYL CITRATE (PF) 250 MCG/5ML IJ SOLN
INTRAMUSCULAR | Status: DC | PRN
  Administered 2022-07-23 (×4): 50 ug via INTRAVENOUS

## 2022-07-23 MED ORDER — 0.9 % SODIUM CHLORIDE (POUR BTL) OPTIME
TOPICAL | Status: DC | PRN
  Administered 2022-07-23: 1000 mL

## 2022-07-23 MED ORDER — CHLORHEXIDINE GLUCONATE 0.12 % MT SOLN
15.0000 mL | Freq: Once | OROMUCOSAL | Status: AC
Start: 2022-07-23 — End: 2022-07-23
  Filled 2022-07-23: qty 15

## 2022-07-23 MED ORDER — SUFENTANIL CITRATE 50 MCG/ML IV SOLN
INTRAVENOUS | Status: AC
  Filled 2022-07-23: qty 1

## 2022-07-23 MED ORDER — EPINEPHRINE PF 1 MG/ML IJ SOLN
INTRAMUSCULAR | Status: AC
  Filled 2022-07-23: qty 1

## 2022-07-23 MED ORDER — OXYCODONE HCL 5 MG/5ML PO SOLN: 5.0000 mg | Freq: Once | ORAL | Status: DC | PRN

## 2022-07-23 MED ORDER — ROCURONIUM BROMIDE 10 MG/ML (PF) SYRINGE
PREFILLED_SYRINGE | INTRAVENOUS | Status: DC | PRN
  Administered 2022-07-23 (×2): 20 mg via INTRAVENOUS
  Administered 2022-07-23: 30 mg via INTRAVENOUS
  Administered 2022-07-23: 50 mg via INTRAVENOUS

## 2022-07-23 MED ORDER — DEXAMETHASONE SODIUM PHOSPHATE 10 MG/ML IJ SOLN
INTRAMUSCULAR | Status: AC
  Filled 2022-07-23: qty 1

## 2022-07-23 MED ORDER — HYDROCODONE-ACETAMINOPHEN 5-325 MG PO TABS
1.0000 | ORAL_TABLET | Freq: Four times a day (QID) | ORAL | 0 refills | Status: AC | PRN
Start: 2022-07-23 — End: 2022-07-26
  Filled 2022-07-23: qty 10, 3d supply, fill #0

## 2022-07-23 MED ORDER — SUCCINYLCHOLINE CHLORIDE 200 MG/10ML IV SOSY
PREFILLED_SYRINGE | INTRAVENOUS | Status: DC | PRN
  Administered 2022-07-23: 120 mg via INTRAVENOUS

## 2022-07-23 MED ORDER — OXYCODONE HCL 5 MG PO TABS: 5.0000 mg | ORAL_TABLET | Freq: Once | ORAL | Status: DC | PRN

## 2022-07-23 MED ORDER — ONDANSETRON HCL 4 MG/2ML IJ SOLN
INTRAMUSCULAR | Status: DC | PRN
  Administered 2022-07-23: 4 mg via INTRAVENOUS

## 2022-07-23 MED ORDER — SUCCINYLCHOLINE CHLORIDE 200 MG/10ML IV SOSY
PREFILLED_SYRINGE | INTRAVENOUS | Status: AC
  Filled 2022-07-23: qty 10

## 2022-07-23 SURGICAL SUPPLY — 40 items
ATTRACTOMAT 16X20 MAGNETIC DRP (DRAPES) ×1 IMPLANT
BAG COUNTER SPONGE SURGICOUNT (BAG) ×2 IMPLANT
CANISTER SUCT 3000ML PPV (MISCELLANEOUS) ×2 IMPLANT
CLEANER TIP ELECTROSURG 2X2 (MISCELLANEOUS) ×2 IMPLANT
CNTNR URN SCR LID CUP LEK RST (MISCELLANEOUS) ×2 IMPLANT
CONT SPEC 4OZ STRL OR WHT (MISCELLANEOUS) ×2
CORD BIPOLAR FORCEPS 12FT (ELECTRODE) ×2 IMPLANT
COVER SURGICAL LIGHT HANDLE (MISCELLANEOUS) ×2 IMPLANT
ELECT COATED BLADE 2.86 ST (ELECTRODE) ×2 IMPLANT
ELECT NDL BLADE 2-5/6 (NEEDLE) IMPLANT
ELECT NEEDLE BLADE 2-5/6 (NEEDLE) ×2 IMPLANT
ELECT REM PT RETURN 9FT ADLT (ELECTROSURGICAL) ×2
ELECTRODE REM PT RTRN 9FT ADLT (ELECTROSURGICAL) ×2 IMPLANT
GLOVE BIOGEL M 7.0 STRL (GLOVE) ×4 IMPLANT
GOWN STRL REUS W/ TWL LRG LVL3 (GOWN DISPOSABLE) ×4 IMPLANT
GOWN STRL REUS W/TWL LRG LVL3 (GOWN DISPOSABLE) ×4
HEMOSTAT ARISTA ABSORB 3G PWDR (HEMOSTASIS) ×1 IMPLANT
KIT BASIN OR (CUSTOM PROCEDURE TRAY) ×2 IMPLANT
KIT TURNOVER KIT B (KITS) ×2 IMPLANT
NDL FILTER BLUNT 18X1 1/2 (NEEDLE) IMPLANT
NDL HYPO 25GX1X1/2 BEV (NEEDLE) IMPLANT
NEEDLE FILTER BLUNT 18X 1/2SAF (NEEDLE) ×2
NEEDLE FILTER BLUNT 18X1 1/2 (NEEDLE) ×2 IMPLANT
NEEDLE HYPO 25GX1X1/2 BEV (NEEDLE) ×2 IMPLANT
NS IRRIG 1000ML POUR BTL (IV SOLUTION) ×2 IMPLANT
PAD ARMBOARD 7.5X6 YLW CONV (MISCELLANEOUS) ×4 IMPLANT
PATTIES SURGICAL .75X.75 (GAUZE/BANDAGES/DRESSINGS) ×2 IMPLANT
PENCIL SMOKE EVACUATOR (MISCELLANEOUS) ×2 IMPLANT
STAPLER VISISTAT 35W (STAPLE) ×2 IMPLANT
SUT ETHILON 3 0 PS 1 (SUTURE) ×1 IMPLANT
SUT ETHILON 5 0 PS 2 18 (SUTURE) ×1 IMPLANT
SUT SILK 2 0 PERMA HAND 18 BK (SUTURE) ×1 IMPLANT
SUT SILK 2 0 SH (SUTURE) ×1 IMPLANT
SUT SILK 3 0 REEL (SUTURE) ×1 IMPLANT
SUT VIC AB 3-0 PS2 18 (SUTURE) ×5 IMPLANT
SUT VIC AB 4-0 P-3 18XBRD (SUTURE) ×1 IMPLANT
SUT VIC AB 4-0 P3 18 (SUTURE) ×2
TOWEL GREEN STERILE (TOWEL DISPOSABLE) ×2 IMPLANT
TRAY ENT MC OR (CUSTOM PROCEDURE TRAY) ×2 IMPLANT
WATER STERILE IRR 1000ML POUR (IV SOLUTION) ×2 IMPLANT

## 2022-07-23 NOTE — Transfer of Care (Signed)
Immediate Anesthesia Transfer of Care Note  Patient: Douglas Wiggins  Procedure(s) Performed: EXCISION OF INTRAORAL MALIGNANT MASS WITH BUCCAL FAT FLAP RECONSTRUCTION (Right: Mouth)  Patient Location: PACU  Anesthesia Type:General  Level of Consciousness: awake, alert  and oriented  Airway & Oxygen Therapy: Patient Spontanous Breathing and Patient connected to face mask oxygen  Post-op Assessment: Report given to RN, Post -op Vital signs reviewed and stable, Patient moving all extremities X 4 and Patient able to stick tongue midline  Post vital signs: Reviewed  Last Vitals:  Vitals Value Taken Time  BP 186/94 07/23/22 1715  Temp 98.6   Pulse 90 07/23/22 1717  Resp 23 07/23/22 1717  SpO2 94 % 07/23/22 1717  Vitals shown include unvalidated device data.  Last Pain:  Vitals:   07/23/22 1052  TempSrc: Oral  PainSc: 0-No pain         Complications: No notable events documented.

## 2022-07-23 NOTE — H&P (Signed)
Douglas Wiggins is an 56 y.o. male.    Chief Complaint:  Squamous cell carcinoma of oral cavity  HPI: Patient presents today for planned elective procedure.  He denies any interval change in history since office visit on 06/19/2022:  [name] is a 56 y.o. male who presents as a new consult, referred by Diona Browner, DMD, for evaluation and treatment of intraoral lesion. Patient recently underwent extraction with Dr. Hoyt Koch on 06/06/2022 of teeth #1 8, 15, 16, 17, 30, 61, 32 as well as biopsy of the soft tissue of the right retromolar trigone. Surgical pathology reported atypical squamous proliferation with verrucous features, consistent with at least squamous cell carcinoma in situ, suspicious for superficial invasion.  He presents today for further evaluation and management. He endorses mild pain from surgical site. Patient is a non-smoker, with no significant tobacco use history. He denies significant alcohol consumption. He endorses history of GERD, and states that he currently takes reflux medication on a daily basis. He also reports concerns regarding hoarseness. He denies dysphagia, odynophagia, unintentional weight loss.  Past Medical History:  Diagnosis Date   ADHD    Chronic lower back pain    Complication of anesthesia    Pt reports that he 'hiccups' 2 days following administration of anesthesia   Depression    Diverticulosis of colon    GERD (gastroesophageal reflux disease)    Gout    per pt last flare-up 2018   Hiatal hernia    History of closed head injury 1992   traumatic---  per pt in coma few days,  residual memory issues   History of diverticulitis of colon    s/p  colon resection for sigmoid perforation 01-06-2012   History of sepsis 05/2017   Hyperlipidemia    Hypertension    Hypothyroidism    Lichen planus    Memory difficulty    seconday traumatic head injury 1992   Migraines    Numbness and tingling of right arm    due to  cervical nerve damage   OA  (osteoarthritis)    right hip,  back, knees, ankles, neck   OSA (obstructive sleep apnea)    does not wear cpap/states MILD- no sleep study in years   Sciatica, right side    Wears glasses    Wears partial dentures    upper     Past Surgical History:  Procedure Laterality Date   ANKLE ARTHROSCOPY Right 05-20-2003   dr graves '@[facility]'$    ANTERIOR CERVICAL DECOMP/DISCECTOMY FUSION N/A 12/21/2021   Procedure: CERVICAL FIVE-SIX ANTERIOR CERVICAL DISCECTOMY FUSION;  Surgeon: Earnie Larsson, MD;  Location: [place];  Service: Neurosurgery;  Laterality: N/A;   APPLICATION OF WOUND VAC  10/29/2012   Procedure: APPLICATION OF WOUND VAC;  Surgeon: Imogene Burn. Georgette Dover, MD;  Location: [place];  Service: General;;   CARPAL TUNNEL RELEASE Left 1990's   CARPAL TUNNEL RELEASE Right 12/21/2021   Procedure: Right CARPAL TUNNEL RELEASE;  Surgeon: Earnie Larsson, MD;  Location: [place];  Service: Neurosurgery;  Laterality: Right;  3C/RM 18   COLON RESECTION  10/26/2012   Procedure: COLON RESECTION;  Surgeon: Imogene Burn. Georgette Dover, MD;  Location: [place];  Service: General;  Laterality: N/A;  Partial colon resection with anastomosis   COLOSTOMY CLOSURE  04/13/2012   Procedure: COLOSTOMY CLOSURE;  Surgeon: Imogene Burn. Georgette Dover, MD;  Location: [facility];  Service: General;  Laterality: N/A;   COLOSTOMY REVISION  01/06/2012   Procedure: COLON RESECTION SIGMOID;  Surgeon: Imogene Burn.  Georgette Dover, MD;  Location: [facility];  Service: General;  Laterality: N/A;  with hartmans procedure   HAND SURGERY Left 08-02-2009   dr Burney Gauze '@[facility]'$    repair left fifth digit crush injury   INSERTION OF MESH N/A 11/10/2013   Procedure: INSERTION OF MESH;  Surgeon: Imogene Burn. Georgette Dover, MD;  Location: [place];  Service: General;  Laterality: N/A;   KNEE ARTHROSCOPY Right 1990's   LAPAROTOMY  10/26/2012   Procedure: EXPLORATORY LAPAROTOMY;  Surgeon: Imogene Burn. Georgette Dover, MD;  Location: [place];  Service: General;  Laterality: N/A;   LAPAROTOMY  10/27/2012   Procedure: EXPLORATORY  LAPAROTOMY;  Surgeon: Zenovia Jarred, MD;  Location: [place];  Service: General;  Laterality: N/A;   LAPAROTOMY  10/29/2012   Procedure: EXPLORATORY LAPAROTOMY;  Surgeon: Imogene Burn. Georgette Dover, MD;  Location: [place];  Service: General;  Laterality: N/A;  exploratory laparotomy, partial closure of abdominal wound,     SHOULDER ARTHROSCOPY Right 10-06-2008  dr supple  '@[facility]'$    TOOTH EXTRACTION N/A 06/06/2022   Procedure: DENTAL RESTORATION/EXTRACTIONS;  Surgeon: Diona Browner, DMD;  Location: [place];  Service: Oral Surgery;  Laterality: N/A;   TOTAL HIP ARTHROPLASTY Right 01/19/2019   Procedure: TOTAL HIP ARTHROPLASTY ANTERIOR APPROACH;  Surgeon: [name], MD;  Location[facility];  Service: Orthopedics;  Laterality: Right;  70 mins   TOTAL KNEE ARTHROPLASTY Left 05/25/2019   Procedure: TOTAL KNEE ARTHROPLASTY;  Surgeon: [name], MD;  Location: [facility];  Service: Orthopedics;  Laterality: Left;  40mns   VENTRAL HERNIA REPAIR  10/26/2012   Procedure: HERNIA REPAIR VENTRAL ADULT;  Surgeon: MImogene Burn TGeorgette Dover MD;  [place]  Service: General;  Laterality: N/A;  Primary Repair Ventral Hernia   VENTRAL HERNIA REPAIR N/A 11/10/2013   Procedure: LAPAROSCOPIC VENTRAL HERNIA ;  Surgeon: MImogene Burn TGeorgette Dover MD;  [facility];  Service: General;  Laterality: N/A;    Family History  Problem Relation Age of Onset   Heart disease Mother    Lung cancer Father     Social History:  reports that he has quit smoking. His smoking use included cigarettes. He quit smokeless tobacco use about 33 years ago.  His smokeless tobacco use included chew. He reports that he does not currently use drugs. He reports that he does not drink alcohol.  Allergies:  Allergies  Allergen Reactions   Sulfa Antibiotics Hives and Rash    Medications Prior to Admission  Medication Sig Dispense Refill   amphetamine-dextroamphetamine (ADDERALL) 20 MG tablet Take 20 mg by mouth daily as needed (focus or attention.).      cyclobenzaprine (FLEXERIL) 10 MG tablet Take 10 mg by mouth in the morning.     DULoxetine (CYMBALTA) 60 MG capsule Take 60 mg by mouth in the morning.     gabapentin (NEURONTIN) 300 MG capsule Take 300 mg by mouth 2 (two) times daily.     levothyroxine (SYNTHROID) 137 MCG tablet Take 137 mcg by mouth daily at 2 am.     losartan-hydrochlorothiazide (HYZAAR) 100-12.5 MG tablet Take 1 tablet by mouth in the morning.     meloxicam (MOBIC) 15 MG tablet Take 15 mg by mouth in the morning.     pantoprazole (PROTONIX) 40 MG tablet Take 40 mg by mouth in the morning.     traZODone (DESYREL) 50 MG tablet Take 50 mg by mouth at bedtime as needed for sleep.     clindamycin (CLEOCIN) 300 MG capsule Take 1 capsule (300 mg total) by  mouth 3 (three) times daily. (Patient not taking: Reported on 07/19/2022) 21 capsule 0   oxyCODONE-acetaminophen (PERCOCET) 5-325 MG tablet Take 1-2 tablets by mouth every 6 (six) hours as needed. (Patient not taking: Reported on 07/19/2022) 30 tablet 0    Results for orders placed or performed during the hospital encounter of 07/23/22 (from the past 48 hour(s))  Surgical pcr screen     Status: None   Collection Time: 07/23/22 10:46 AM   Specimen: Nasal Mucosa; Nasal Swab  Result Value Ref Range   MRSA, PCR NEGATIVE NEGATIVE   Staphylococcus aureus NEGATIVE NEGATIVE    Comment: (NOTE) The Xpert SA Assay (FDA approved for NASAL specimens in patients 24 years of age and older), is one component of a comprehensive surveillance program. It is not intended to diagnose infection nor to guide or monitor treatment. Performed at [facility], [place] [address], [place], [place] [address]   Basic metabolic panel per protocol     Status: Abnormal   Collection Time: 07/23/22 11:20 AM  Result Value Ref Range   Sodium 138 135 - 145 mmol/L   Potassium 3.9 3.5 - 5.1 mmol/L   Chloride 102 98 - 111 mmol/L   CO2 26 22 - 32 mmol/L   Glucose, Bld 109 (H) 70 - 99 mg/dL    Comment:  Glucose reference range applies only to samples taken after fasting for at least 8 hours.   BUN 20 6 - 20 mg/dL   Creatinine, Ser 1.31 (H) 0.61 - 1.24 mg/dL   Calcium 9.6 8.9 - 10.3 mg/dL   GFR, Estimated >60 >60 mL/min    Comment: (NOTE) Calculated using the CKD-EPI Creatinine Equation (2021)    Anion gap 10 5 - 15    Comment: Performed at [facility] [address], [place], [place] [address]  CBC per protocol     Status: Abnormal   Collection Time: 07/23/22 11:20 AM  Result Value Ref Range   WBC 5.8 4.0 - 10.5 K/uL   RBC 5.83 (H) 4.22 - 5.81 MIL/uL   Hemoglobin 16.2 13.0 - 17.0 g/dL   HCT 48.8 39.0 - 52.0 %   MCV 83.7 80.0 - 100.0 fL   MCH 27.8 26.0 - 34.0 pg   MCHC 33.2 30.0 - 36.0 g/dL   RDW 13.7 11.5 - 15.5 %   Platelets 210 150 - 400 K/uL   nRBC 0.0 0.0 - 0.2 %    Comment: Performed at [facility], [place] [address] [place], [place] [address]   No results found.  ROS: ROS  Blood pressure (!) 154/99, pulse 66, temperature 98.3 F (36.8 C), temperature source Oral, resp. rate 20, height '6\' 4"'$  (1.93 m), weight 122.5 kg, SpO2 97 %.  PHYSICAL EXAM: Physical Exam Constitutional:      Appearance: Normal appearance.  HENT:     Right Ear: External ear normal.     Left Ear: External ear normal.     Mouth/Throat:     Comments: Irregularity of right buccal mucosa consistent with history of lichen planus, with normal healing of biopsy site of right retromolar trigone, without significant atypia appreciated Pulmonary:     Effort: Pulmonary effort is normal.  Neurological:     General: No focal deficit present.     Mental Status: He is alert.  Psychiatric:        Mood and Affect: Mood normal.        Behavior: Behavior normal.  Studies Reviewed: CT Neck reviewed   Assessment/Plan Sadiel Mota is a 56 y.o. male with recent history of extraction with Dr. Hoyt Koch on 06/06/2022 of teeth #1 8, 15, 16, 35, 62, 44, 8 as well as biopsy of the soft tissue of the  right retromolar trigone. Surgical pathology reported atypical squamous proliferation with verrucous features, consistent with at least squamous cell carcinoma in situ, suspicious for superficial invasion. CT neck with no evidence of mass, no concerning adenopathy. -To OR today for wide local excision and reconstruction with local flap. Risks of surgery, benefits as well as expected postoperative course and recovery were reviewed with patient.   Taisei Bonnette A Marlee Trentman 07/23/2022, 1:02 PM

## 2022-07-23 NOTE — Discharge Instructions (Signed)
Follow a strict diet of soft foods for 2 weeks after surgery  Ensure good oral hygiene after all meals and morning/evening Discoloration and faint odor of the flap during healing is normal, and will go away by weel 4 Tylenol/Motrin for pain  Call office with any questions

## 2022-07-23 NOTE — Anesthesia Preprocedure Evaluation (Addendum)
Anesthesia Evaluation  Patient identified by MRN, date of birth, ID band Patient awake    Reviewed: Allergy & Precautions, NPO status , Patient's Chart, lab work & pertinent test results  History of Anesthesia Complications Negative for: history of anesthetic complications  Airway Mallampati: II  TM Distance: >3 FB Neck ROM: Full    Dental  (+) Partial Upper, Missing,    Pulmonary neg shortness of breath, sleep apnea , neg COPD, neg recent URI, former smoker,    breath sounds clear to auscultation       Cardiovascular hypertension, Pt. on medications (-) angina+ Past MI  (-) dysrhythmias  Rhythm:Regular  TTE 2018: SAM not clearly seen but small LVOT gradient, mild LVH, EF 65-70%, mild to moderate LAE      Neuro/Psych  Headaches, PSYCHIATRIC DISORDERS Depression  Neuromuscular disease    GI/Hepatic Neg liver ROS, hiatal hernia, GERD  Medicated and Controlled,  Endo/Other  Hypothyroidism   Renal/GU Renal InsufficiencyRenal diseaseLab Results      Component                Value               Date                      CREATININE               1.31 (H)            07/23/2022             negative genitourinary   Musculoskeletal  (+) Arthritis ,   Abdominal   Peds  Hematology negative hematology ROS (+) Lab Results      Component                Value               Date                      WBC                      5.8                 07/23/2022                HGB                      16.2                07/23/2022                HCT                      48.8                07/23/2022                MCV                      83.7                07/23/2022                PLT                      210  07/23/2022              Anesthesia Other Findings Squamous cell carcinoma of oral mucosa  Reproductive/Obstetrics negative OB ROS                            Anesthesia  Physical Anesthesia Plan  ASA: 3  Anesthesia Plan: General   Post-op Pain Management: Tylenol PO (pre-op)*   Induction: Intravenous  PONV Risk Score and Plan: 2 and Ondansetron, Dexamethasone, Treatment may vary due to age or medical condition and Midazolam  Airway Management Planned: Video Laryngoscope Planned and Nasal ETT  Additional Equipment: None  Intra-op Plan:   Post-operative Plan: Extubation in OR  Informed Consent: I have reviewed the patients History and Physical, chart, labs and discussed the procedure including the risks, benefits and alternatives for the proposed anesthesia with the patient or authorized representative who has indicated his/her understanding and acceptance.     Dental advisory given  Plan Discussed with: CRNA  Anesthesia Plan Comments:       Anesthesia Quick Evaluation

## 2022-07-23 NOTE — Anesthesia Postprocedure Evaluation (Signed)
Anesthesia Post Note  Patient: Douglas Wiggins  Procedure(s) Performed: EXCISION OF INTRAORAL MALIGNANT MASS WITH BUCCAL FAT FLAP RECONSTRUCTION (Right: Mouth)     Patient location during evaluation: PACU Anesthesia Type: General Level of consciousness: awake and alert Pain management: pain level controlled Vital Signs Assessment: post-procedure vital signs reviewed and stable Respiratory status: spontaneous breathing, nonlabored ventilation and respiratory function stable Cardiovascular status: stable and blood pressure returned to baseline Anesthetic complications: no   No notable events documented.  Last Vitals:  Vitals:   07/23/22 1730 07/23/22 1745  BP: (!) 141/87 (!) 162/96  Pulse: 91 82  Resp: 13 17  Temp:    SpO2: 93% 94%    Last Pain:  Vitals:   07/23/22 1745  TempSrc:   PainSc: 0-No pain                 Audry Pili

## 2022-07-23 NOTE — Anesthesia Procedure Notes (Signed)
Procedure Name: Intubation Date/Time: 07/23/2022 2:08 PM  Performed by: Maude Leriche, CRNAPre-anesthesia Checklist: Patient identified, Emergency Drugs available, Suction available and Patient being monitored Patient Re-evaluated:Patient Re-evaluated prior to induction Oxygen Delivery Method: Circle system utilized Preoxygenation: Pre-oxygenation with 100% oxygen Induction Type: IV induction Ventilation: Mask ventilation without difficulty Laryngoscope Size: Glidescope and 3 Grade View: Grade I Nasal Tubes: Nasal prep performed, Nasal Rae and Left Tube size: 7.5 mm Number of attempts: 1 Airway Equipment and Method: Video-laryngoscopy Placement Confirmation: ETT inserted through vocal cords under direct vision, positive ETCO2 and breath sounds checked- equal and bilateral Secured at: 19 cm Tube secured with: Tape Dental Injury: Teeth and Oropharynx as per pre-operative assessment

## 2022-07-23 NOTE — Brief Op Note (Signed)
07/23/2022  5:35 PM  PATIENT:  Douglas Wiggins  56 y.o. male  PRE-OPERATIVE DIAGNOSIS:  Squamous cell carcinoma of oral mucosa  POST-OPERATIVE DIAGNOSIS:  Squamous cell carcinoma of oral mucosa  PROCEDURE:  Procedure(s): EXCISION OF INTRAORAL MALIGNANT MASS WITH BUCCAL FAT FLAP RECONSTRUCTION (Right)  SURGEON:  Surgeon(s) and Role:    * Dearia Wilmouth A, DO - Primary    * Jenetta Downer, MD - Assist  ANESTHESIA:   local and general  EBL:  30 mL   BLOOD ADMINISTERED:none  DRAINS: none   LOCAL MEDICATIONS USED:  LIDOCAINE   SPECIMEN:  Excision  DISPOSITION OF SPECIMEN:  PATHOLOGY  COUNTS:  YES  TOURNIQUET:  * No tourniquets in log *  DICTATION: .Note written in EPIC  PLAN OF CARE: Discharge to home after PACU  PATIENT DISPOSITION:  PACU - hemodynamically stable.   Delay start of Pharmacological VTE agent (>24hrs) due to surgical blood loss or risk of bleeding: not applicable

## 2022-07-23 NOTE — Op Note (Signed)
OPERATIVE NOTE  KYDAN SHANHOLTZER Date/Time of Admission: 07/23/2022 10:37 AM  CSN: [id number]:[id number] Attending Provider: Ebbie Latus A, DO Room/Bed: MCPO/NONE DOB: Nov 12, 1966 Age: 56 y.o.   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  Post-Op Diagnosis: Squamous cell carcinoma of oral mucosa  Procedure: Procedure(s): EXCISION OF INTRAORAL MALIGNANT MASS WITH BUCCAL FAT FLAP RECONSTRUCTION (95621)  Anesthesia: General  Surgeon(s): Trynity Skousen A Benard Minturn, DO  Assist: Eulah Pont, MD  Staff: Circulator: Margy Clarks, RN Relief Circulator: Catha Nottingham, RN Scrub Person: Zannie Kehr, RN; Thora Lance Circulator Assistant: Celene Squibb, RN  Implants: * No implants in log *  Specimens: ID Type Source Tests Collected by Time Destination  1 : Right oral cavity resection (short stitch marks medial, long stitch marks anterior) Tissue PATH Other SURGICAL PATHOLOGY Lynsey Ange, [place] A, DO 07/23/2022 1437   2 : Right oral cavity resection (additional deep margin) Tissue PATH Other SURGICAL PATHOLOGY Alara Daniel A, DO 07/23/2022 1446   3 : Right oral cavity resection (lateral margin) Tissue PATH Other SURGICAL PATHOLOGY Edmon Magid A, DO 07/23/2022 1538   4 : Right oral cavity resection (medial margin) Tissue PATH Other SURGICAL PATHOLOGY Rivaldo Hineman A, DO 07/23/2022 1538   5 : Right oral cavity resection (posterior margin) Tissue PATH Other SURGICAL PATHOLOGY Yovana Scogin A, DO 07/23/2022 1543   6 : Right oral cavity resection (additional posterior margin, superficial and deep, deep is the small one) Tissue PATH Other SURGICAL PATHOLOGY Maliik Karner A, DO 02/06/6577 4696     Complications: None  EBL: 25 ML  Condition: stable  Operative Findings:  Well-healed biopsy site along retromolar trigone, with slightly abnormal/irregular appearing overlying mucosa, no evidence of palpable mass lesion or ulceration  Description  of Operation: Once operative consent was obtained, and the surgical site confirmed with the operating room team, the patient was brought back to the operating room and general endotracheal anesthesia was obtained. The patient was turned over to the ENT service.  A bite block was used to expose the oral cavity, and a 2-0 silk suture was used to help retract the tongue laterally, to expose the right retromolar trigone and buccal mucosa.  An area measuring 1.5 x 2 cm along the retromolar trigone and adjacent buccal mucosa was identified, with slightly atypical overlying mucosa, but no evidence of palpable mass lesion or gross mucosal ulceration.  This area was sharply excised from the underlying mandible, oriented using suture, and sent as a specimen to pathology for evaluation.  An additional deep specimen from a dental socket was excised and sent to pathology.  Frozen section pathology came back with positive margins posterior, laterally and medially.  Additional 5 mm margins were sent from these locations as permanent specimens to pathology.  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  An additional posterior margin was obtained and sent to pathology.  At this point, all bleeding was controlled using bipolar cautery and attention was turned to reconstruction of the defect.  A needle tip Bovie was used to incise along the patient's right buccal mucosa through the buccinator posterior inferior to Stensen's duct.  Using blunt dissection, the buccal fat pad was mobilized and rotated over the defect it was tacked into place using interrupted 3-0 Vicryl sutures.  The oral cavity was copiously irrigated and no significant bleeding was appreciated.  An oral gastric tube was placed into the stomach and suctioned to  reduce postoperative nausea. The patient was turned back over to the anesthesia service and extubated in the room without event.  The patient was then transferred to the PACU in stable condition.    Assistance was required throughout the surgical procedure including surgical planning, retraction, management of bleeding and surgical decision-making throughout the operation.    Jason Coop, [facility]  07/23/2022

## 2022-07-24 ENCOUNTER — Encounter (HOSPITAL_COMMUNITY): Payer: Self-pay | Admitting: Otolaryngology

## 2022-07-25 LAB — SURGICAL PATHOLOGY

## 2024-07-06 DIAGNOSIS — K219 Gastro-esophageal reflux disease without esophagitis: Secondary | ICD-10-CM | POA: Diagnosis not present

## 2024-07-06 DIAGNOSIS — G4700 Insomnia, unspecified: Secondary | ICD-10-CM | POA: Diagnosis not present

## 2024-07-06 DIAGNOSIS — M5030 Other cervical disc degeneration, unspecified cervical region: Secondary | ICD-10-CM | POA: Diagnosis not present

## 2024-07-06 DIAGNOSIS — I10 Essential (primary) hypertension: Secondary | ICD-10-CM | POA: Diagnosis not present

## 2024-07-06 DIAGNOSIS — E039 Hypothyroidism, unspecified: Secondary | ICD-10-CM | POA: Diagnosis not present
